# Patient Record
Sex: Female | Born: 1940
Health system: Southern US, Community
[De-identification: ages and names within clinical notes are randomized; demographics above are authoritative.]

## PROBLEM LIST (undated history)

## (undated) DIAGNOSIS — J189 Pneumonia, unspecified organism: Secondary | ICD-10-CM

## (undated) DIAGNOSIS — I38 Endocarditis, valve unspecified: Secondary | ICD-10-CM

## (undated) DIAGNOSIS — M1990 Unspecified osteoarthritis, unspecified site: Secondary | ICD-10-CM

## (undated) DIAGNOSIS — H3530 Unspecified macular degeneration: Secondary | ICD-10-CM

## (undated) DIAGNOSIS — N3946 Mixed incontinence: Secondary | ICD-10-CM

## (undated) DIAGNOSIS — R011 Cardiac murmur, unspecified: Secondary | ICD-10-CM

## (undated) DIAGNOSIS — R112 Nausea with vomiting, unspecified: Secondary | ICD-10-CM

## (undated) DIAGNOSIS — M5430 Sciatica, unspecified side: Secondary | ICD-10-CM

## (undated) DIAGNOSIS — F4320 Adjustment disorder, unspecified: Secondary | ICD-10-CM

## (undated) DIAGNOSIS — E7800 Pure hypercholesterolemia, unspecified: Secondary | ICD-10-CM

## (undated) DIAGNOSIS — I10 Essential (primary) hypertension: Secondary | ICD-10-CM

## (undated) DIAGNOSIS — G43909 Migraine, unspecified, not intractable, without status migrainosus: Secondary | ICD-10-CM

## (undated) DIAGNOSIS — I4891 Unspecified atrial fibrillation: Secondary | ICD-10-CM

## (undated) DIAGNOSIS — K224 Dyskinesia of esophagus: Secondary | ICD-10-CM

## (undated) DIAGNOSIS — E663 Overweight: Secondary | ICD-10-CM

## (undated) DIAGNOSIS — H269 Unspecified cataract: Secondary | ICD-10-CM

## (undated) DIAGNOSIS — E119 Type 2 diabetes mellitus without complications: Secondary | ICD-10-CM

## (undated) DIAGNOSIS — I2510 Atherosclerotic heart disease of native coronary artery without angina pectoris: Secondary | ICD-10-CM

## (undated) DIAGNOSIS — M5137 Other intervertebral disc degeneration, lumbosacral region: Secondary | ICD-10-CM

## (undated) DIAGNOSIS — I201 Angina pectoris with documented spasm: Secondary | ICD-10-CM

## (undated) DIAGNOSIS — H9190 Unspecified hearing loss, unspecified ear: Secondary | ICD-10-CM

## (undated) DIAGNOSIS — A6920 Lyme disease, unspecified: Secondary | ICD-10-CM

## (undated) DIAGNOSIS — M797 Fibromyalgia: Secondary | ICD-10-CM

## (undated) DIAGNOSIS — I509 Heart failure, unspecified: Secondary | ICD-10-CM

## (undated) DIAGNOSIS — N958 Other specified menopausal and perimenopausal disorders: Secondary | ICD-10-CM

## (undated) DIAGNOSIS — G459 Transient cerebral ischemic attack, unspecified: Secondary | ICD-10-CM

## (undated) DIAGNOSIS — Z9889 Other specified postprocedural states: Secondary | ICD-10-CM

## (undated) DIAGNOSIS — G4700 Insomnia, unspecified: Secondary | ICD-10-CM

## (undated) DIAGNOSIS — M24569 Contracture, unspecified knee: Secondary | ICD-10-CM

## (undated) DIAGNOSIS — L409 Psoriasis, unspecified: Secondary | ICD-10-CM

## (undated) DIAGNOSIS — N209 Urinary calculus, unspecified: Secondary | ICD-10-CM

## (undated) HISTORY — DX: Unspecified macular degeneration: H35.30

## (undated) HISTORY — DX: Lyme disease, unspecified: A69.20

## (undated) HISTORY — DX: Type 2 diabetes mellitus without complications: E11.9

## (undated) HISTORY — DX: Unspecified atrial fibrillation: I48.91

## (undated) HISTORY — DX: Unspecified cataract: H26.9

## (undated) HISTORY — DX: Psoriasis, unspecified: L40.9

## (undated) HISTORY — DX: Transient cerebral ischemic attack, unspecified: G45.9

## (undated) HISTORY — PX: REPLACEMENT TOTAL KNEE BILATERAL: SUR1225

## (undated) HISTORY — PX: JOINT REPLACEMENT: SHX530

## (undated) HISTORY — DX: Adjustment disorder, unspecified: F43.20

## (undated) HISTORY — DX: Angina pectoris with documented spasm: I20.1

## (undated) HISTORY — DX: Cardiac murmur, unspecified: R01.1

## (undated) HISTORY — DX: Urinary calculus, unspecified: N20.9

## (undated) HISTORY — PX: CATARACT EXTRACTION, BILATERAL: SHX1313

## (undated) HISTORY — DX: Pneumonia, unspecified organism: J18.9

## (undated) HISTORY — DX: Dyskinesia of esophagus: K22.4

## (undated) HISTORY — DX: Contracture, unspecified knee: M24.569

## (undated) HISTORY — DX: Mixed incontinence: N39.46

## (undated) HISTORY — DX: Other specified menopausal and perimenopausal disorders: N95.8

## (undated) HISTORY — DX: Sciatica, unspecified side: M54.30

## (undated) HISTORY — DX: Migraine, unspecified, not intractable, without status migrainosus: G43.909

## (undated) HISTORY — DX: Overweight: E66.3

## (undated) HISTORY — DX: Unspecified osteoarthritis, unspecified site: M19.90

## (undated) HISTORY — DX: Endocarditis, valve unspecified: I38

## (undated) HISTORY — PX: ABDOMINAL HYSTERECTOMY: SHX81

## (undated) HISTORY — PX: FRACTURE SURGERY: SHX138

## (undated) HISTORY — DX: Unspecified hearing loss, unspecified ear: H91.90

## (undated) HISTORY — DX: Other intervertebral disc degeneration, lumbosacral region: M51.37

## (undated) HISTORY — DX: Insomnia, unspecified: G47.00

---

## 1968-03-26 HISTORY — PX: NISSEN FUNDOPLICATION: SHX2091

## 1979-03-27 HISTORY — PX: CHOLECYSTECTOMY: SHX55

## 2003-10-25 HISTORY — PX: COLONOSCOPY: SHX174

## 2005-10-17 DIAGNOSIS — N209 Urinary calculus, unspecified: Secondary | ICD-10-CM

## 2005-10-17 DIAGNOSIS — G43909 Migraine, unspecified, not intractable, without status migrainosus: Secondary | ICD-10-CM

## 2005-10-17 DIAGNOSIS — F4320 Adjustment disorder, unspecified: Secondary | ICD-10-CM

## 2005-10-17 DIAGNOSIS — I201 Angina pectoris with documented spasm: Secondary | ICD-10-CM

## 2005-10-17 DIAGNOSIS — N3946 Mixed incontinence: Secondary | ICD-10-CM

## 2005-10-17 HISTORY — DX: Adjustment disorder, unspecified: F43.20

## 2005-10-17 HISTORY — DX: Migraine, unspecified, not intractable, without status migrainosus: G43.909

## 2005-10-17 HISTORY — DX: Mixed incontinence: N39.46

## 2005-10-17 HISTORY — DX: Urinary calculus, unspecified: N20.9

## 2005-10-17 HISTORY — DX: Angina pectoris with documented spasm: I20.1

## 2006-04-18 HISTORY — PX: COLONOSCOPY: SHX174

## 2006-06-12 DIAGNOSIS — L409 Psoriasis, unspecified: Secondary | ICD-10-CM

## 2006-06-12 HISTORY — DX: Psoriasis, unspecified: L40.9

## 2006-09-24 HISTORY — PX: ESOPHAGOGASTRODUODENOSCOPY: SHX1529

## 2008-02-20 HISTORY — PX: ESOPHAGOGASTRODUODENOSCOPY: SHX1529

## 2009-03-21 HISTORY — PX: ESOPHAGOGASTRODUODENOSCOPY: SHX1529

## 2009-07-26 DIAGNOSIS — M51379 Other intervertebral disc degeneration, lumbosacral region without mention of lumbar back pain or lower extremity pain: Secondary | ICD-10-CM

## 2009-07-26 DIAGNOSIS — M24569 Contracture, unspecified knee: Secondary | ICD-10-CM

## 2009-07-26 DIAGNOSIS — M5137 Other intervertebral disc degeneration, lumbosacral region: Secondary | ICD-10-CM

## 2009-07-26 HISTORY — DX: Other intervertebral disc degeneration, lumbosacral region: M51.37

## 2009-07-26 HISTORY — DX: Contracture, unspecified knee: M24.569

## 2009-07-26 HISTORY — DX: Other intervertebral disc degeneration, lumbosacral region without mention of lumbar back pain or lower extremity pain: M51.379

## 2009-10-10 DIAGNOSIS — N958 Other specified menopausal and perimenopausal disorders: Secondary | ICD-10-CM

## 2009-10-10 DIAGNOSIS — G4700 Insomnia, unspecified: Secondary | ICD-10-CM

## 2009-10-10 HISTORY — DX: Other specified menopausal and perimenopausal disorders: N95.8

## 2009-10-10 HISTORY — DX: Insomnia, unspecified: G47.00

## 2010-09-19 DIAGNOSIS — E663 Overweight: Secondary | ICD-10-CM

## 2010-09-19 HISTORY — DX: Overweight: E66.3

## 2010-12-18 DIAGNOSIS — H9190 Unspecified hearing loss, unspecified ear: Secondary | ICD-10-CM

## 2010-12-18 HISTORY — DX: Unspecified hearing loss, unspecified ear: H91.90

## 2012-05-29 HISTORY — PX: ESOPHAGOGASTRODUODENOSCOPY: SHX1529

## 2013-03-26 HISTORY — PX: CORONARY ARTERY BYPASS GRAFT: SHX141

## 2013-10-16 HISTORY — PX: ESOPHAGOGASTRODUODENOSCOPY: SHX1529

## 2013-10-24 LAB — HEPATIC FUNCTION PANEL
ALK PHOS: 123 U/L (ref 25–125)
ALT: 27 U/L (ref 7–35)
AST: 20 U/L (ref 13–35)
BILIRUBIN, TOTAL: 0.2 mg/dL

## 2013-11-24 HISTORY — PX: MITRAL VALVE REPAIR: SHX2039

## 2013-12-10 HISTORY — PX: AORTIC VALVE REPLACEMENT: SHX41

## 2014-03-25 DIAGNOSIS — I951 Orthostatic hypotension: Secondary | ICD-10-CM | POA: Diagnosis not present

## 2014-03-25 DIAGNOSIS — Z954 Presence of other heart-valve replacement: Secondary | ICD-10-CM | POA: Diagnosis not present

## 2014-03-25 DIAGNOSIS — I5021 Acute systolic (congestive) heart failure: Secondary | ICD-10-CM | POA: Diagnosis not present

## 2014-03-25 DIAGNOSIS — I480 Paroxysmal atrial fibrillation: Secondary | ICD-10-CM | POA: Diagnosis not present

## 2014-03-25 DIAGNOSIS — Z955 Presence of coronary angioplasty implant and graft: Secondary | ICD-10-CM | POA: Diagnosis not present

## 2014-03-25 DIAGNOSIS — R531 Weakness: Secondary | ICD-10-CM | POA: Diagnosis not present

## 2014-03-25 DIAGNOSIS — Z7901 Long term (current) use of anticoagulants: Secondary | ICD-10-CM | POA: Diagnosis not present

## 2014-03-29 DIAGNOSIS — Z7901 Long term (current) use of anticoagulants: Secondary | ICD-10-CM | POA: Diagnosis not present

## 2014-03-29 DIAGNOSIS — I951 Orthostatic hypotension: Secondary | ICD-10-CM | POA: Diagnosis not present

## 2014-03-29 DIAGNOSIS — I5021 Acute systolic (congestive) heart failure: Secondary | ICD-10-CM | POA: Diagnosis not present

## 2014-03-29 DIAGNOSIS — Z5181 Encounter for therapeutic drug level monitoring: Secondary | ICD-10-CM | POA: Diagnosis not present

## 2014-03-29 DIAGNOSIS — R531 Weakness: Secondary | ICD-10-CM | POA: Diagnosis not present

## 2014-03-29 DIAGNOSIS — I480 Paroxysmal atrial fibrillation: Secondary | ICD-10-CM | POA: Diagnosis not present

## 2014-03-29 DIAGNOSIS — Z954 Presence of other heart-valve replacement: Secondary | ICD-10-CM | POA: Diagnosis not present

## 2014-04-01 DIAGNOSIS — I5021 Acute systolic (congestive) heart failure: Secondary | ICD-10-CM | POA: Diagnosis not present

## 2014-04-01 DIAGNOSIS — R531 Weakness: Secondary | ICD-10-CM | POA: Diagnosis not present

## 2014-04-01 DIAGNOSIS — Z5181 Encounter for therapeutic drug level monitoring: Secondary | ICD-10-CM | POA: Diagnosis not present

## 2014-04-01 DIAGNOSIS — Z7901 Long term (current) use of anticoagulants: Secondary | ICD-10-CM | POA: Diagnosis not present

## 2014-04-01 DIAGNOSIS — I480 Paroxysmal atrial fibrillation: Secondary | ICD-10-CM | POA: Diagnosis not present

## 2014-04-01 DIAGNOSIS — Z954 Presence of other heart-valve replacement: Secondary | ICD-10-CM | POA: Diagnosis not present

## 2014-04-01 DIAGNOSIS — I951 Orthostatic hypotension: Secondary | ICD-10-CM | POA: Diagnosis not present

## 2014-04-02 DIAGNOSIS — I480 Paroxysmal atrial fibrillation: Secondary | ICD-10-CM | POA: Diagnosis not present

## 2014-04-02 DIAGNOSIS — Z954 Presence of other heart-valve replacement: Secondary | ICD-10-CM | POA: Diagnosis not present

## 2014-04-02 DIAGNOSIS — R531 Weakness: Secondary | ICD-10-CM | POA: Diagnosis not present

## 2014-04-02 DIAGNOSIS — Z7901 Long term (current) use of anticoagulants: Secondary | ICD-10-CM | POA: Diagnosis not present

## 2014-04-02 DIAGNOSIS — I5021 Acute systolic (congestive) heart failure: Secondary | ICD-10-CM | POA: Diagnosis not present

## 2014-04-02 DIAGNOSIS — I951 Orthostatic hypotension: Secondary | ICD-10-CM | POA: Diagnosis not present

## 2014-04-05 DIAGNOSIS — R531 Weakness: Secondary | ICD-10-CM | POA: Diagnosis not present

## 2014-04-05 DIAGNOSIS — I480 Paroxysmal atrial fibrillation: Secondary | ICD-10-CM | POA: Diagnosis not present

## 2014-04-05 DIAGNOSIS — Z7901 Long term (current) use of anticoagulants: Secondary | ICD-10-CM | POA: Diagnosis not present

## 2014-04-05 DIAGNOSIS — I5021 Acute systolic (congestive) heart failure: Secondary | ICD-10-CM | POA: Diagnosis not present

## 2014-04-05 DIAGNOSIS — Z5181 Encounter for therapeutic drug level monitoring: Secondary | ICD-10-CM | POA: Diagnosis not present

## 2014-04-05 DIAGNOSIS — I951 Orthostatic hypotension: Secondary | ICD-10-CM | POA: Diagnosis not present

## 2014-04-05 DIAGNOSIS — Z954 Presence of other heart-valve replacement: Secondary | ICD-10-CM | POA: Diagnosis not present

## 2014-04-06 DIAGNOSIS — I2510 Atherosclerotic heart disease of native coronary artery without angina pectoris: Secondary | ICD-10-CM | POA: Diagnosis not present

## 2014-04-06 DIAGNOSIS — M797 Fibromyalgia: Secondary | ICD-10-CM | POA: Diagnosis not present

## 2014-04-06 DIAGNOSIS — I068 Other rheumatic aortic valve diseases: Secondary | ICD-10-CM | POA: Diagnosis not present

## 2014-04-06 DIAGNOSIS — E118 Type 2 diabetes mellitus with unspecified complications: Secondary | ICD-10-CM | POA: Diagnosis not present

## 2014-04-06 DIAGNOSIS — R531 Weakness: Secondary | ICD-10-CM | POA: Diagnosis not present

## 2014-04-06 DIAGNOSIS — I10 Essential (primary) hypertension: Secondary | ICD-10-CM | POA: Diagnosis not present

## 2014-04-06 DIAGNOSIS — E784 Other hyperlipidemia: Secondary | ICD-10-CM | POA: Diagnosis not present

## 2014-04-07 DIAGNOSIS — I951 Orthostatic hypotension: Secondary | ICD-10-CM | POA: Diagnosis not present

## 2014-04-07 DIAGNOSIS — R531 Weakness: Secondary | ICD-10-CM | POA: Diagnosis not present

## 2014-04-07 DIAGNOSIS — Z743 Need for continuous supervision: Secondary | ICD-10-CM | POA: Diagnosis not present

## 2014-04-07 DIAGNOSIS — Z79899 Other long term (current) drug therapy: Secondary | ICD-10-CM | POA: Diagnosis not present

## 2014-04-07 DIAGNOSIS — I6789 Other cerebrovascular disease: Secondary | ICD-10-CM | POA: Diagnosis not present

## 2014-04-07 DIAGNOSIS — R42 Dizziness and giddiness: Secondary | ICD-10-CM | POA: Diagnosis not present

## 2014-04-07 DIAGNOSIS — I10 Essential (primary) hypertension: Secondary | ICD-10-CM | POA: Diagnosis not present

## 2014-04-07 DIAGNOSIS — J189 Pneumonia, unspecified organism: Secondary | ICD-10-CM | POA: Diagnosis not present

## 2014-04-07 DIAGNOSIS — Z7901 Long term (current) use of anticoagulants: Secondary | ICD-10-CM | POA: Diagnosis not present

## 2014-04-07 DIAGNOSIS — I480 Paroxysmal atrial fibrillation: Secondary | ICD-10-CM | POA: Diagnosis not present

## 2014-04-07 DIAGNOSIS — I5021 Acute systolic (congestive) heart failure: Secondary | ICD-10-CM | POA: Diagnosis not present

## 2014-04-07 DIAGNOSIS — E119 Type 2 diabetes mellitus without complications: Secondary | ICD-10-CM | POA: Diagnosis not present

## 2014-04-07 DIAGNOSIS — Z954 Presence of other heart-valve replacement: Secondary | ICD-10-CM | POA: Diagnosis not present

## 2014-04-09 DIAGNOSIS — I951 Orthostatic hypotension: Secondary | ICD-10-CM | POA: Diagnosis not present

## 2014-04-09 DIAGNOSIS — Z5181 Encounter for therapeutic drug level monitoring: Secondary | ICD-10-CM | POA: Diagnosis not present

## 2014-04-09 DIAGNOSIS — R531 Weakness: Secondary | ICD-10-CM | POA: Diagnosis not present

## 2014-04-09 DIAGNOSIS — Z7901 Long term (current) use of anticoagulants: Secondary | ICD-10-CM | POA: Diagnosis not present

## 2014-04-09 DIAGNOSIS — I480 Paroxysmal atrial fibrillation: Secondary | ICD-10-CM | POA: Diagnosis not present

## 2014-04-09 DIAGNOSIS — I5021 Acute systolic (congestive) heart failure: Secondary | ICD-10-CM | POA: Diagnosis not present

## 2014-04-09 DIAGNOSIS — Z954 Presence of other heart-valve replacement: Secondary | ICD-10-CM | POA: Diagnosis not present

## 2014-04-09 LAB — PROTIME-INR: INR: 1.5 — AB (ref 0.9–1.1)

## 2014-04-12 DIAGNOSIS — R531 Weakness: Secondary | ICD-10-CM | POA: Diagnosis not present

## 2014-04-12 DIAGNOSIS — I5021 Acute systolic (congestive) heart failure: Secondary | ICD-10-CM | POA: Diagnosis not present

## 2014-04-12 DIAGNOSIS — I951 Orthostatic hypotension: Secondary | ICD-10-CM | POA: Diagnosis not present

## 2014-04-12 DIAGNOSIS — Z954 Presence of other heart-valve replacement: Secondary | ICD-10-CM | POA: Diagnosis not present

## 2014-04-12 DIAGNOSIS — Z5181 Encounter for therapeutic drug level monitoring: Secondary | ICD-10-CM | POA: Diagnosis not present

## 2014-04-12 DIAGNOSIS — I480 Paroxysmal atrial fibrillation: Secondary | ICD-10-CM | POA: Diagnosis not present

## 2014-04-12 DIAGNOSIS — Z7901 Long term (current) use of anticoagulants: Secondary | ICD-10-CM | POA: Diagnosis not present

## 2014-04-12 LAB — PROTIME-INR: INR: 1.3 — AB (ref 0.9–1.1)

## 2014-04-15 DIAGNOSIS — Z7901 Long term (current) use of anticoagulants: Secondary | ICD-10-CM | POA: Diagnosis not present

## 2014-04-15 DIAGNOSIS — Z5181 Encounter for therapeutic drug level monitoring: Secondary | ICD-10-CM | POA: Diagnosis not present

## 2014-04-16 DIAGNOSIS — H3532 Exudative age-related macular degeneration: Secondary | ICD-10-CM | POA: Diagnosis not present

## 2014-04-16 DIAGNOSIS — H3531 Nonexudative age-related macular degeneration: Secondary | ICD-10-CM | POA: Diagnosis not present

## 2014-04-16 DIAGNOSIS — E119 Type 2 diabetes mellitus without complications: Secondary | ICD-10-CM | POA: Diagnosis not present

## 2014-04-16 DIAGNOSIS — H43813 Vitreous degeneration, bilateral: Secondary | ICD-10-CM | POA: Diagnosis not present

## 2014-04-16 LAB — HM DIABETES EYE EXAM

## 2014-04-19 DIAGNOSIS — I5021 Acute systolic (congestive) heart failure: Secondary | ICD-10-CM | POA: Diagnosis not present

## 2014-04-19 DIAGNOSIS — I480 Paroxysmal atrial fibrillation: Secondary | ICD-10-CM | POA: Diagnosis not present

## 2014-04-19 DIAGNOSIS — Z7901 Long term (current) use of anticoagulants: Secondary | ICD-10-CM | POA: Diagnosis not present

## 2014-04-19 DIAGNOSIS — Z954 Presence of other heart-valve replacement: Secondary | ICD-10-CM | POA: Diagnosis not present

## 2014-04-19 DIAGNOSIS — I951 Orthostatic hypotension: Secondary | ICD-10-CM | POA: Diagnosis not present

## 2014-04-19 DIAGNOSIS — R531 Weakness: Secondary | ICD-10-CM | POA: Diagnosis not present

## 2014-04-19 DIAGNOSIS — Z5181 Encounter for therapeutic drug level monitoring: Secondary | ICD-10-CM | POA: Diagnosis not present

## 2014-04-19 LAB — PROTIME-INR: INR: 1.8 — AB (ref 0.9–1.1)

## 2014-04-21 DIAGNOSIS — Z7901 Long term (current) use of anticoagulants: Secondary | ICD-10-CM | POA: Diagnosis not present

## 2014-04-21 DIAGNOSIS — Z5181 Encounter for therapeutic drug level monitoring: Secondary | ICD-10-CM | POA: Diagnosis not present

## 2014-04-21 LAB — PROTIME-INR: INR: 2.7 — AB (ref 0.9–1.1)

## 2014-04-23 DIAGNOSIS — R0602 Shortness of breath: Secondary | ICD-10-CM | POA: Diagnosis not present

## 2014-04-23 DIAGNOSIS — J90 Pleural effusion, not elsewhere classified: Secondary | ICD-10-CM | POA: Diagnosis not present

## 2014-04-23 DIAGNOSIS — Z7901 Long term (current) use of anticoagulants: Secondary | ICD-10-CM | POA: Diagnosis not present

## 2014-04-23 DIAGNOSIS — Z5181 Encounter for therapeutic drug level monitoring: Secondary | ICD-10-CM | POA: Diagnosis not present

## 2014-04-23 LAB — PROTIME-INR: INR: 2.4 — AB (ref 0.9–1.1)

## 2014-04-28 DIAGNOSIS — Z5181 Encounter for therapeutic drug level monitoring: Secondary | ICD-10-CM | POA: Diagnosis not present

## 2014-04-28 DIAGNOSIS — Z7901 Long term (current) use of anticoagulants: Secondary | ICD-10-CM | POA: Diagnosis not present

## 2014-04-28 DIAGNOSIS — Z954 Presence of other heart-valve replacement: Secondary | ICD-10-CM | POA: Diagnosis not present

## 2014-04-28 DIAGNOSIS — R531 Weakness: Secondary | ICD-10-CM | POA: Diagnosis not present

## 2014-04-28 DIAGNOSIS — I5021 Acute systolic (congestive) heart failure: Secondary | ICD-10-CM | POA: Diagnosis not present

## 2014-04-28 DIAGNOSIS — I951 Orthostatic hypotension: Secondary | ICD-10-CM | POA: Diagnosis not present

## 2014-04-28 DIAGNOSIS — I480 Paroxysmal atrial fibrillation: Secondary | ICD-10-CM | POA: Diagnosis not present

## 2014-04-28 LAB — PROTIME-INR: INR: 1.9 — AB (ref 0.9–1.1)

## 2014-05-03 DIAGNOSIS — Z7901 Long term (current) use of anticoagulants: Secondary | ICD-10-CM | POA: Diagnosis not present

## 2014-05-03 DIAGNOSIS — Z5181 Encounter for therapeutic drug level monitoring: Secondary | ICD-10-CM | POA: Diagnosis not present

## 2014-05-10 DIAGNOSIS — Z5181 Encounter for therapeutic drug level monitoring: Secondary | ICD-10-CM | POA: Diagnosis not present

## 2014-05-10 DIAGNOSIS — Z7901 Long term (current) use of anticoagulants: Secondary | ICD-10-CM | POA: Diagnosis not present

## 2014-05-10 DIAGNOSIS — I480 Paroxysmal atrial fibrillation: Secondary | ICD-10-CM | POA: Diagnosis not present

## 2014-05-14 DIAGNOSIS — H2513 Age-related nuclear cataract, bilateral: Secondary | ICD-10-CM | POA: Diagnosis not present

## 2014-05-14 DIAGNOSIS — E119 Type 2 diabetes mellitus without complications: Secondary | ICD-10-CM | POA: Diagnosis not present

## 2014-05-14 DIAGNOSIS — H3532 Exudative age-related macular degeneration: Secondary | ICD-10-CM | POA: Diagnosis not present

## 2014-05-14 DIAGNOSIS — H43813 Vitreous degeneration, bilateral: Secondary | ICD-10-CM | POA: Diagnosis not present

## 2014-05-14 LAB — HM DIABETES EYE EXAM

## 2014-05-17 DIAGNOSIS — I340 Nonrheumatic mitral (valve) insufficiency: Secondary | ICD-10-CM | POA: Diagnosis not present

## 2014-05-17 DIAGNOSIS — I4891 Unspecified atrial fibrillation: Secondary | ICD-10-CM | POA: Diagnosis not present

## 2014-05-17 DIAGNOSIS — I480 Paroxysmal atrial fibrillation: Secondary | ICD-10-CM | POA: Diagnosis not present

## 2014-05-17 DIAGNOSIS — Z952 Presence of prosthetic heart valve: Secondary | ICD-10-CM | POA: Diagnosis not present

## 2014-05-17 LAB — PROTIME-INR: INR: 1.5 — AB (ref 0.9–1.1)

## 2014-05-24 DIAGNOSIS — I4891 Unspecified atrial fibrillation: Secondary | ICD-10-CM | POA: Diagnosis not present

## 2014-05-24 LAB — PROTIME-INR: INR: 1.8 — AB (ref 0.9–1.1)

## 2014-05-31 DIAGNOSIS — I4891 Unspecified atrial fibrillation: Secondary | ICD-10-CM | POA: Diagnosis not present

## 2014-05-31 LAB — PROTIME-INR

## 2014-06-07 DIAGNOSIS — I4891 Unspecified atrial fibrillation: Secondary | ICD-10-CM | POA: Diagnosis not present

## 2014-06-07 LAB — PROTIME-INR: INR: 1.6 — AB (ref 0.9–1.1)

## 2014-06-16 DIAGNOSIS — E119 Type 2 diabetes mellitus without complications: Secondary | ICD-10-CM | POA: Diagnosis not present

## 2014-06-16 DIAGNOSIS — H3532 Exudative age-related macular degeneration: Secondary | ICD-10-CM | POA: Diagnosis not present

## 2014-06-16 DIAGNOSIS — H43813 Vitreous degeneration, bilateral: Secondary | ICD-10-CM | POA: Diagnosis not present

## 2014-06-16 DIAGNOSIS — H2513 Age-related nuclear cataract, bilateral: Secondary | ICD-10-CM | POA: Diagnosis not present

## 2014-06-16 LAB — HM DIABETES EYE EXAM

## 2014-06-22 DIAGNOSIS — I4891 Unspecified atrial fibrillation: Secondary | ICD-10-CM | POA: Diagnosis not present

## 2014-06-22 LAB — PROTIME-INR

## 2014-07-05 DIAGNOSIS — I2510 Atherosclerotic heart disease of native coronary artery without angina pectoris: Secondary | ICD-10-CM | POA: Diagnosis not present

## 2014-07-05 DIAGNOSIS — I4891 Unspecified atrial fibrillation: Secondary | ICD-10-CM | POA: Diagnosis not present

## 2014-07-05 DIAGNOSIS — I061 Rheumatic aortic insufficiency: Secondary | ICD-10-CM | POA: Diagnosis not present

## 2014-07-05 DIAGNOSIS — I509 Heart failure, unspecified: Secondary | ICD-10-CM | POA: Diagnosis not present

## 2014-07-05 DIAGNOSIS — E118 Type 2 diabetes mellitus with unspecified complications: Secondary | ICD-10-CM | POA: Diagnosis not present

## 2014-07-05 DIAGNOSIS — I10 Essential (primary) hypertension: Secondary | ICD-10-CM | POA: Diagnosis not present

## 2014-07-05 DIAGNOSIS — E785 Hyperlipidemia, unspecified: Secondary | ICD-10-CM | POA: Diagnosis not present

## 2014-07-06 DIAGNOSIS — E118 Type 2 diabetes mellitus with unspecified complications: Secondary | ICD-10-CM | POA: Diagnosis not present

## 2014-07-06 DIAGNOSIS — I4891 Unspecified atrial fibrillation: Secondary | ICD-10-CM | POA: Diagnosis not present

## 2014-07-06 DIAGNOSIS — I2510 Atherosclerotic heart disease of native coronary artery without angina pectoris: Secondary | ICD-10-CM | POA: Diagnosis not present

## 2014-07-06 LAB — PROTIME-INR: INR: 1.6 — AB (ref 0.9–1.1)

## 2014-07-19 DIAGNOSIS — I4891 Unspecified atrial fibrillation: Secondary | ICD-10-CM | POA: Diagnosis not present

## 2014-07-19 LAB — PROTIME-INR: INR: 2.1 — AB (ref 0.9–1.1)

## 2014-08-13 DIAGNOSIS — E119 Type 2 diabetes mellitus without complications: Secondary | ICD-10-CM | POA: Diagnosis not present

## 2014-08-13 DIAGNOSIS — Z043 Encounter for examination and observation following other accident: Secondary | ICD-10-CM | POA: Diagnosis not present

## 2014-08-13 DIAGNOSIS — I10 Essential (primary) hypertension: Secondary | ICD-10-CM | POA: Diagnosis not present

## 2014-08-13 DIAGNOSIS — S8991XA Unspecified injury of right lower leg, initial encounter: Secondary | ICD-10-CM | POA: Diagnosis not present

## 2014-08-13 DIAGNOSIS — Z79899 Other long term (current) drug therapy: Secondary | ICD-10-CM | POA: Diagnosis not present

## 2014-08-13 DIAGNOSIS — M25561 Pain in right knee: Secondary | ICD-10-CM | POA: Diagnosis not present

## 2014-08-13 DIAGNOSIS — S0990XA Unspecified injury of head, initial encounter: Secondary | ICD-10-CM | POA: Diagnosis not present

## 2014-08-17 DIAGNOSIS — I4891 Unspecified atrial fibrillation: Secondary | ICD-10-CM | POA: Diagnosis not present

## 2014-08-17 LAB — PROTIME-INR: INR: 2.5 — AB (ref 0.9–1.1)

## 2014-09-07 DIAGNOSIS — L723 Sebaceous cyst: Secondary | ICD-10-CM | POA: Diagnosis not present

## 2014-09-13 DIAGNOSIS — I4891 Unspecified atrial fibrillation: Secondary | ICD-10-CM | POA: Diagnosis not present

## 2014-09-13 LAB — PROTIME-INR: INR: 1.6 — AB (ref 0.9–1.1)

## 2014-09-17 DIAGNOSIS — E119 Type 2 diabetes mellitus without complications: Secondary | ICD-10-CM | POA: Diagnosis not present

## 2014-09-17 DIAGNOSIS — H43813 Vitreous degeneration, bilateral: Secondary | ICD-10-CM | POA: Diagnosis not present

## 2014-09-17 DIAGNOSIS — H2513 Age-related nuclear cataract, bilateral: Secondary | ICD-10-CM | POA: Diagnosis not present

## 2014-09-17 DIAGNOSIS — H3532 Exudative age-related macular degeneration: Secondary | ICD-10-CM | POA: Diagnosis not present

## 2014-09-17 LAB — HM DIABETES EYE EXAM

## 2014-09-21 DIAGNOSIS — L723 Sebaceous cyst: Secondary | ICD-10-CM | POA: Diagnosis not present

## 2014-09-28 DIAGNOSIS — Z5181 Encounter for therapeutic drug level monitoring: Secondary | ICD-10-CM | POA: Diagnosis not present

## 2014-09-28 DIAGNOSIS — J189 Pneumonia, unspecified organism: Secondary | ICD-10-CM | POA: Diagnosis not present

## 2014-09-28 DIAGNOSIS — R222 Localized swelling, mass and lump, trunk: Secondary | ICD-10-CM | POA: Diagnosis not present

## 2014-09-28 DIAGNOSIS — Z01812 Encounter for preprocedural laboratory examination: Secondary | ICD-10-CM | POA: Diagnosis not present

## 2014-09-28 DIAGNOSIS — Z01810 Encounter for preprocedural cardiovascular examination: Secondary | ICD-10-CM | POA: Diagnosis not present

## 2014-09-28 DIAGNOSIS — Z01818 Encounter for other preprocedural examination: Secondary | ICD-10-CM | POA: Diagnosis not present

## 2014-09-28 DIAGNOSIS — Z7901 Long term (current) use of anticoagulants: Secondary | ICD-10-CM | POA: Diagnosis not present

## 2014-09-28 LAB — PROTIME-INR

## 2014-10-11 DIAGNOSIS — Z01818 Encounter for other preprocedural examination: Secondary | ICD-10-CM | POA: Diagnosis not present

## 2014-10-11 DIAGNOSIS — L723 Sebaceous cyst: Secondary | ICD-10-CM | POA: Diagnosis not present

## 2014-10-11 DIAGNOSIS — E118 Type 2 diabetes mellitus with unspecified complications: Secondary | ICD-10-CM | POA: Diagnosis not present

## 2014-10-11 DIAGNOSIS — I068 Other rheumatic aortic valve diseases: Secondary | ICD-10-CM | POA: Diagnosis not present

## 2014-10-14 DIAGNOSIS — I2510 Atherosclerotic heart disease of native coronary artery without angina pectoris: Secondary | ICD-10-CM | POA: Diagnosis not present

## 2014-10-14 DIAGNOSIS — R2231 Localized swelling, mass and lump, right upper limb: Secondary | ICD-10-CM | POA: Diagnosis not present

## 2014-10-14 DIAGNOSIS — Z951 Presence of aortocoronary bypass graft: Secondary | ICD-10-CM | POA: Diagnosis not present

## 2014-10-14 DIAGNOSIS — E119 Type 2 diabetes mellitus without complications: Secondary | ICD-10-CM | POA: Diagnosis not present

## 2014-10-14 DIAGNOSIS — I509 Heart failure, unspecified: Secondary | ICD-10-CM | POA: Diagnosis not present

## 2014-10-14 DIAGNOSIS — I10 Essential (primary) hypertension: Secondary | ICD-10-CM | POA: Diagnosis not present

## 2014-10-14 DIAGNOSIS — L728 Other follicular cysts of the skin and subcutaneous tissue: Secondary | ICD-10-CM | POA: Diagnosis not present

## 2014-10-22 DIAGNOSIS — E119 Type 2 diabetes mellitus without complications: Secondary | ICD-10-CM | POA: Diagnosis not present

## 2014-10-22 DIAGNOSIS — H2513 Age-related nuclear cataract, bilateral: Secondary | ICD-10-CM | POA: Diagnosis not present

## 2014-10-22 DIAGNOSIS — H43813 Vitreous degeneration, bilateral: Secondary | ICD-10-CM | POA: Diagnosis not present

## 2014-10-22 DIAGNOSIS — H3532 Exudative age-related macular degeneration: Secondary | ICD-10-CM | POA: Diagnosis not present

## 2014-10-22 LAB — HM DIABETES EYE EXAM

## 2014-11-12 DIAGNOSIS — H2513 Age-related nuclear cataract, bilateral: Secondary | ICD-10-CM | POA: Diagnosis not present

## 2014-11-12 DIAGNOSIS — E119 Type 2 diabetes mellitus without complications: Secondary | ICD-10-CM | POA: Diagnosis not present

## 2014-11-12 DIAGNOSIS — H3532 Exudative age-related macular degeneration: Secondary | ICD-10-CM | POA: Diagnosis not present

## 2014-11-12 DIAGNOSIS — H43813 Vitreous degeneration, bilateral: Secondary | ICD-10-CM | POA: Diagnosis not present

## 2014-11-12 LAB — HM DIABETES EYE EXAM

## 2014-11-23 DIAGNOSIS — I4891 Unspecified atrial fibrillation: Secondary | ICD-10-CM | POA: Diagnosis not present

## 2014-11-23 DIAGNOSIS — Z23 Encounter for immunization: Secondary | ICD-10-CM | POA: Diagnosis not present

## 2014-11-23 LAB — PROTIME-INR

## 2014-11-24 DIAGNOSIS — H3532 Exudative age-related macular degeneration: Secondary | ICD-10-CM | POA: Diagnosis not present

## 2014-11-24 LAB — HM DIABETES EYE EXAM

## 2015-01-08 DIAGNOSIS — I4891 Unspecified atrial fibrillation: Secondary | ICD-10-CM | POA: Diagnosis not present

## 2015-01-08 LAB — PROTIME-INR: INR: 1.6 — AB (ref 0.9–1.1)

## 2015-01-10 DIAGNOSIS — J019 Acute sinusitis, unspecified: Secondary | ICD-10-CM | POA: Diagnosis not present

## 2015-01-21 DIAGNOSIS — I4891 Unspecified atrial fibrillation: Secondary | ICD-10-CM | POA: Diagnosis not present

## 2015-01-21 DIAGNOSIS — I2510 Atherosclerotic heart disease of native coronary artery without angina pectoris: Secondary | ICD-10-CM | POA: Diagnosis not present

## 2015-01-21 DIAGNOSIS — I509 Heart failure, unspecified: Secondary | ICD-10-CM | POA: Diagnosis not present

## 2015-01-21 DIAGNOSIS — E1165 Type 2 diabetes mellitus with hyperglycemia: Secondary | ICD-10-CM | POA: Diagnosis not present

## 2015-01-21 DIAGNOSIS — E784 Other hyperlipidemia: Secondary | ICD-10-CM | POA: Diagnosis not present

## 2015-01-21 DIAGNOSIS — I10 Essential (primary) hypertension: Secondary | ICD-10-CM | POA: Diagnosis not present

## 2015-02-07 DIAGNOSIS — I509 Heart failure, unspecified: Secondary | ICD-10-CM | POA: Diagnosis not present

## 2015-02-07 DIAGNOSIS — I2510 Atherosclerotic heart disease of native coronary artery without angina pectoris: Secondary | ICD-10-CM | POA: Diagnosis not present

## 2015-02-07 DIAGNOSIS — I4891 Unspecified atrial fibrillation: Secondary | ICD-10-CM | POA: Diagnosis not present

## 2015-02-07 DIAGNOSIS — E1165 Type 2 diabetes mellitus with hyperglycemia: Secondary | ICD-10-CM | POA: Diagnosis not present

## 2015-02-07 LAB — HEPATIC FUNCTION PANEL
ALT: 18 U/L (ref 7–35)
AST: 17 U/L (ref 13–35)
Alkaline Phosphatase: 123 U/L (ref 25–125)
BILIRUBIN, TOTAL: 0.2 mg/dL

## 2015-02-07 LAB — CBC AND DIFFERENTIAL
HCT: 36 % (ref 36–46)
Hemoglobin: 11.4 g/dL — AB (ref 12.0–16.0)
Platelets: 361 10*3/uL (ref 150–399)
WBC: 10.2 10*3/mL

## 2015-02-07 LAB — BASIC METABOLIC PANEL
BUN: 18 mg/dL (ref 4–21)
CREATININE: 0.8 mg/dL (ref 0.5–1.1)
GLUCOSE: 85 mg/dL
Potassium: 3.8 mmol/L (ref 3.4–5.3)
Sodium: 139 mmol/L (ref 137–147)

## 2015-02-07 LAB — HEMOGLOBIN A1C: HEMOGLOBIN A1C: 6.4

## 2015-02-07 LAB — LIPID PANEL
Cholesterol: 179 mg/dL (ref 0–200)
HDL: 63 mg/dL (ref 35–70)
LDL Cholesterol: 81 mg/dL
Triglycerides: 176 mg/dL — AB (ref 40–160)

## 2015-02-07 LAB — MICROALBUMIN, URINE: Microalb, Ur: 420

## 2015-02-07 LAB — POCT INR: INR: 6.5 — AB (ref 0.9–1.1)

## 2015-02-07 LAB — POCT ERYTHROCYTE SEDIMENTATION RATE, NON-AUTOMATED: SED RATE: 66 mm

## 2015-02-11 DIAGNOSIS — N390 Urinary tract infection, site not specified: Secondary | ICD-10-CM | POA: Diagnosis not present

## 2015-02-11 DIAGNOSIS — M549 Dorsalgia, unspecified: Secondary | ICD-10-CM | POA: Diagnosis not present

## 2015-02-11 DIAGNOSIS — R300 Dysuria: Secondary | ICD-10-CM | POA: Diagnosis not present

## 2015-02-18 DIAGNOSIS — M549 Dorsalgia, unspecified: Secondary | ICD-10-CM | POA: Diagnosis not present

## 2015-02-18 DIAGNOSIS — Z9049 Acquired absence of other specified parts of digestive tract: Secondary | ICD-10-CM | POA: Diagnosis not present

## 2015-02-18 DIAGNOSIS — R1032 Left lower quadrant pain: Secondary | ICD-10-CM | POA: Diagnosis not present

## 2015-02-18 DIAGNOSIS — R1031 Right lower quadrant pain: Secondary | ICD-10-CM | POA: Diagnosis not present

## 2015-04-01 DIAGNOSIS — I4891 Unspecified atrial fibrillation: Secondary | ICD-10-CM | POA: Diagnosis not present

## 2015-05-02 ENCOUNTER — Encounter (HOSPITAL_COMMUNITY): Payer: Self-pay | Admitting: Emergency Medicine

## 2015-05-02 ENCOUNTER — Emergency Department (HOSPITAL_COMMUNITY): Payer: Medicare Other

## 2015-05-02 ENCOUNTER — Observation Stay (HOSPITAL_COMMUNITY)
Admission: EM | Admit: 2015-05-02 | Discharge: 2015-05-03 | Disposition: A | Discharge status: Home / Self Care | Payer: Medicare Other | Attending: Family Medicine | Admitting: Family Medicine

## 2015-05-02 DIAGNOSIS — Z87891 Personal history of nicotine dependence: Secondary | ICD-10-CM | POA: Insufficient documentation

## 2015-05-02 DIAGNOSIS — M797 Fibromyalgia: Secondary | ICD-10-CM | POA: Diagnosis not present

## 2015-05-02 DIAGNOSIS — Z7982 Long term (current) use of aspirin: Secondary | ICD-10-CM | POA: Diagnosis not present

## 2015-05-02 DIAGNOSIS — Z79899 Other long term (current) drug therapy: Secondary | ICD-10-CM | POA: Diagnosis not present

## 2015-05-02 DIAGNOSIS — G459 Transient cerebral ischemic attack, unspecified: Secondary | ICD-10-CM | POA: Diagnosis not present

## 2015-05-02 DIAGNOSIS — H3530 Unspecified macular degeneration: Secondary | ICD-10-CM | POA: Insufficient documentation

## 2015-05-02 DIAGNOSIS — R200 Anesthesia of skin: Secondary | ICD-10-CM | POA: Diagnosis not present

## 2015-05-02 DIAGNOSIS — E7800 Pure hypercholesterolemia, unspecified: Secondary | ICD-10-CM | POA: Diagnosis not present

## 2015-05-02 DIAGNOSIS — Z880 Allergy status to penicillin: Secondary | ICD-10-CM | POA: Insufficient documentation

## 2015-05-02 DIAGNOSIS — Z7901 Long term (current) use of anticoagulants: Secondary | ICD-10-CM | POA: Insufficient documentation

## 2015-05-02 DIAGNOSIS — I10 Essential (primary) hypertension: Secondary | ICD-10-CM

## 2015-05-02 DIAGNOSIS — Z7984 Long term (current) use of oral hypoglycemic drugs: Secondary | ICD-10-CM | POA: Insufficient documentation

## 2015-05-02 DIAGNOSIS — E119 Type 2 diabetes mellitus without complications: Secondary | ICD-10-CM | POA: Insufficient documentation

## 2015-05-02 DIAGNOSIS — I2510 Atherosclerotic heart disease of native coronary artery without angina pectoris: Secondary | ICD-10-CM | POA: Insufficient documentation

## 2015-05-02 DIAGNOSIS — R202 Paresthesia of skin: Secondary | ICD-10-CM | POA: Diagnosis not present

## 2015-05-02 HISTORY — DX: Type 2 diabetes mellitus without complications: E11.9

## 2015-05-02 HISTORY — DX: Fibromyalgia: M79.7

## 2015-05-02 HISTORY — DX: Pure hypercholesterolemia, unspecified: E78.00

## 2015-05-02 HISTORY — DX: Essential (primary) hypertension: I10

## 2015-05-02 HISTORY — DX: Unspecified macular degeneration: H35.30

## 2015-05-02 HISTORY — DX: Heart failure, unspecified: I50.9

## 2015-05-02 HISTORY — DX: Atherosclerotic heart disease of native coronary artery without angina pectoris: I25.10

## 2015-05-02 LAB — URINE MICROSCOPIC-ADD ON: RBC / HPF: NONE SEEN RBC/hpf (ref 0–5)

## 2015-05-02 LAB — CBC WITH DIFFERENTIAL/PLATELET
Basophils Absolute: 0 10*3/uL (ref 0.0–0.1)
Basophils Relative: 0 %
EOS ABS: 0.3 10*3/uL (ref 0.0–0.7)
Eosinophils Relative: 3 %
HEMATOCRIT: 37.5 % (ref 36.0–46.0)
HEMOGLOBIN: 12.4 g/dL (ref 12.0–15.0)
LYMPHS ABS: 2.1 10*3/uL (ref 0.7–4.0)
Lymphocytes Relative: 23 %
MCH: 28 pg (ref 26.0–34.0)
MCHC: 33.1 g/dL (ref 30.0–36.0)
MCV: 84.7 fL (ref 78.0–100.0)
MONOS PCT: 8 %
Monocytes Absolute: 0.8 10*3/uL (ref 0.1–1.0)
NEUTROS PCT: 66 %
Neutro Abs: 6.1 10*3/uL (ref 1.7–7.7)
Platelets: 353 10*3/uL (ref 150–400)
RBC: 4.43 MIL/uL (ref 3.87–5.11)
RDW: 14.5 % (ref 11.5–15.5)
WBC: 9.2 10*3/uL (ref 4.0–10.5)

## 2015-05-02 LAB — COMPREHENSIVE METABOLIC PANEL
ALK PHOS: 97 U/L (ref 38–126)
ALT: 16 U/L (ref 14–54)
ANION GAP: 12 (ref 5–15)
AST: 23 U/L (ref 15–41)
Albumin: 3.9 g/dL (ref 3.5–5.0)
BILIRUBIN TOTAL: 0.4 mg/dL (ref 0.3–1.2)
BUN: 24 mg/dL — ABNORMAL HIGH (ref 6–20)
CO2: 25 mmol/L (ref 22–32)
Calcium: 9.2 mg/dL (ref 8.9–10.3)
Chloride: 104 mmol/L (ref 101–111)
Creatinine, Ser: 0.99 mg/dL (ref 0.44–1.00)
GFR calc non Af Amer: 56 mL/min — ABNORMAL LOW (ref >60)
GLUCOSE: 155 mg/dL — AB (ref 65–99)
POTASSIUM: 3.6 mmol/L (ref 3.5–5.1)
SODIUM: 141 mmol/L (ref 135–145)
TOTAL PROTEIN: 7 g/dL (ref 6.5–8.1)

## 2015-05-02 LAB — CBG MONITORING, ED: Glucose-Capillary: 118 mg/dL — ABNORMAL HIGH (ref 65–99)

## 2015-05-02 LAB — PROTIME-INR
INR: 2.16 — AB (ref 0.00–1.49)
PROTHROMBIN TIME: 23.9 s — AB (ref 11.6–15.2)

## 2015-05-02 LAB — URINALYSIS, ROUTINE W REFLEX MICROSCOPIC
Bilirubin Urine: NEGATIVE
Glucose, UA: NEGATIVE mg/dL
Hgb urine dipstick: NEGATIVE
KETONES UR: NEGATIVE mg/dL
NITRITE: NEGATIVE
PH: 5.5 (ref 5.0–8.0)
PROTEIN: NEGATIVE mg/dL
Specific Gravity, Urine: 1.025 (ref 1.005–1.030)

## 2015-05-02 LAB — TROPONIN I: Troponin I: <0.03 ng/mL (ref <0.031)

## 2015-05-02 MED ORDER — WARFARIN SODIUM 5 MG PO TABS
12.0000 mg | ORAL_TABLET | Freq: Once | ORAL | Status: AC
  Administered 2015-05-02: 12 mg via ORAL
  Filled 2015-05-02: qty 2

## 2015-05-02 MED ORDER — METOPROLOL TARTRATE 25 MG PO TABS
12.5000 mg | ORAL_TABLET | Freq: Two times a day (BID) | ORAL | Status: DC
  Administered 2015-05-02 – 2015-05-03 (×2): 12.5 mg via ORAL
  Filled 2015-05-02 (×2): qty 1

## 2015-05-02 MED ORDER — ASPIRIN EC 81 MG PO TBEC
81.0000 mg | DELAYED_RELEASE_TABLET | Freq: Every day | ORAL | Status: DC
  Administered 2015-05-03: 81 mg via ORAL
  Filled 2015-05-02: qty 1

## 2015-05-02 MED ORDER — INSULIN ASPART 100 UNIT/ML ~~LOC~~ SOLN
0.0000 [IU] | Freq: Three times a day (TID) | SUBCUTANEOUS | Status: DC
  Administered 2015-05-03: 1 [IU] via SUBCUTANEOUS

## 2015-05-02 MED ORDER — IRBESARTAN 75 MG PO TABS
75.0000 mg | ORAL_TABLET | Freq: Every day | ORAL | Status: DC
  Administered 2015-05-02 – 2015-05-03 (×2): 75 mg via ORAL
  Filled 2015-05-02 (×2): qty 1

## 2015-05-02 MED ORDER — LORAZEPAM 2 MG/ML IJ SOLN
1.0000 mg | Freq: Once | INTRAMUSCULAR | Status: AC
  Administered 2015-05-02: 1 mg via INTRAVENOUS
  Filled 2015-05-02: qty 1

## 2015-05-02 MED ORDER — WARFARIN - PHARMACIST DOSING INPATIENT
Status: DC
  Administered 2015-05-03: 16:00:00

## 2015-05-02 MED ORDER — POTASSIUM CHLORIDE CRYS ER 10 MEQ PO TBCR
10.0000 meq | EXTENDED_RELEASE_TABLET | Freq: Every day | ORAL | Status: DC
  Administered 2015-05-02 – 2015-05-03 (×2): 10 meq via ORAL
  Filled 2015-05-02 (×4): qty 1

## 2015-05-02 MED ORDER — TRAMADOL HCL 50 MG PO TABS
50.0000 mg | ORAL_TABLET | ORAL | Status: DC | PRN
  Administered 2015-05-02 – 2015-05-03 (×3): 50 mg via ORAL
  Filled 2015-05-02 (×4): qty 1

## 2015-05-02 MED ORDER — STROKE: EARLY STAGES OF RECOVERY BOOK
Freq: Once | Status: AC
  Administered 2015-05-03: 10:00:00
  Filled 2015-05-02: qty 1

## 2015-05-02 MED ORDER — FUROSEMIDE 20 MG PO TABS
20.0000 mg | ORAL_TABLET | Freq: Every day | ORAL | Status: DC
  Administered 2015-05-03: 20 mg via ORAL
  Filled 2015-05-02: qty 1

## 2015-05-02 NOTE — H&P (Signed)
PCP:   PROVIDER NOT IN SYSTEM   Chief Complaint:  Facial numbness  HPI: 75 year old female who   has a past medical history of Hypertension; Hypercholesteremia; Diabetes mellitus without complication ([place]); Fibromyalgia; Macular degeneration; CHF (congestive heart failure) ([place]); and Coronary artery disease. Today presents to the hospital after patient had right facial numbness and numbness in right hand for about 20 minutes. Patient was feeling lethargic, her daughter brought her to the hospital. By the time she reached the ED her symptoms had resolved. CT head was negative, MRI brain also negative for acute stroke. Patient denies any slurred speech. No focal weakness. No blurred vision.   no history of seizures.  Allergies:   Allergies  Allergen Reactions  . Penicillins Anaphylaxis    Has patient had a PCN reaction causing immediate rash, facial/tongue/throat swelling, SOB or lightheadedness with hypotension: Yes Has patient had a PCN reaction causing severe rash involving mucus membranes or skin necrosis: Yes Has patient had a PCN reaction that required hospitalization Yes Has patient had a PCN reaction occurring within the last 10 years: No If all of the above answers are "NO", then may proceed with Cephalosporin use.       Past Medical History  Diagnosis Date  . Hypertension   . Hypercholesteremia   . Diabetes mellitus without complication ([place])   . Fibromyalgia   . Macular degeneration   . CHF (congestive heart failure) ([place])   . Coronary artery disease     Past Surgical History  Procedure Laterality Date  . Cholecystectomy    . Cardiac surgery    . Fracture surgery    . Joint replacement      Prior to Admission medications   Medication Sig Start Date End Date Taking? Authorizing Provider  aspirin EC 81 MG tablet Take 81 mg by mouth daily.   Yes Historical Provider, MD  butalbital-acetaminophen-caffeine (FIORICET, ESGIC) 50-325-40 MG tablet Take 1 tablet by  mouth 2 (two) times daily as needed for headache.   Yes Historical Provider, MD  furosemide (LASIX) 20 MG tablet Take 20 mg by mouth daily.   Yes Historical Provider, MD  metFORMIN (GLUCOPHAGE) 500 MG tablet Take 500 mg by mouth 2 (two) times daily with a meal.   Yes Historical Provider, MD  metoprolol tartrate (LOPRESSOR) 25 MG tablet Take 12.5 mg by mouth 2 (two) times daily.   Yes Historical Provider, MD  Multiple Vitamins-Minerals (PRESERVISION AREDS 2 PO) Take 1 capsule by mouth 2 (two) times daily.   Yes Historical Provider, MD  potassium chloride (K-DUR) 10 MEQ tablet Take 10 mEq by mouth daily.   Yes Historical Provider, MD  traMADol (ULTRAM) 50 MG tablet Take 50 mg by mouth every 4 (four) hours as needed for moderate pain.   Yes Historical Provider, MD  valsartan (DIOVAN) 80 MG tablet Take 80 mg by mouth at bedtime.   Yes Historical Provider, MD  warfarin (COUMADIN) 10 MG tablet Take 2 mg by mouth every evening. *Takes 10mg  and 2mg  tablets to complete dose of 12mg *   Yes Historical Provider, MD  warfarin (COUMADIN) 2 MG tablet Take 10 mg by mouth every evening. *Takes 10mg  and 2mg  tablets to complete dose of 12mg *   Yes Historical Provider, MD    Social History:  reports that she has quit smoking. She has never used smokeless tobacco. She reports that she does not drink alcohol or use illicit drugs.  Family History  Problem Relation Age of Onset  . Stroke Mother   .  Heart failure Mother   . Diabetes Father   . Heart attack Father   . Stroke Father     Danley Danker Weights   05/02/15 1357  Weight: 67.586 kg (149 lb)    All the positives are listed in BOLD  Review of Systems:  HEENT: Headache, blurred vision, runny nose, sore throat Neck: Hypothyroidism, hyperthyroidism,,lymphadenopathy Chest : Shortness of breath, history of COPD, Asthma Heart : Chest pain, history of coronary arterey disease GI:  Nausea, vomiting, diarrhea, constipation, GERD GU: Dysuria, urgency, frequency of  urination, hematuria Neuro: Stroke, seizures, syncope Psych: Depression, anxiety, hallucinations   Physical Exam: Blood pressure 151/85, pulse 66, temperature 99.1 F (37.3 C), temperature source Oral, resp. rate 17, height 5\' 2"  (1.575 m), weight 67.586 kg (149 lb), SpO2 95 %. Constitutional:   Patient is a well-developed and well-nourished female* in no acute distress and cooperative with exam. Head: Normocephalic and atraumatic Mouth: Mucus membranes moist Eyes: PERRL, EOMI, conjunctivae normal Neck: Supple, No Thyromegaly Cardiovascular: RRR, S1 normal, S2 normal Pulmonary/Chest: CTAB, no wheezes, rales, or rhonchi Abdominal: Soft. Non-tender, non-distended, bowel sounds are normal, no masses, organomegaly, or guarding present.  Neurological: A&O x3, Strength is normal and symmetric bilaterally, cranial nerve II-XII are grossly intact, no focal motor deficit, sensory intact to light touch bilaterally.  Extremities : No Cyanosis, Clubbing or Edema  Labs on Admission:  Basic Metabolic Panel:  Recent Labs Lab 05/02/15 1435  NA 141  K 3.6  CL 104  CO2 25  GLUCOSE 155*  BUN 24*  CREATININE 0.99  CALCIUM 9.2   Liver Function Tests:  Recent Labs Lab 05/02/15 1435  AST 23  ALT 16  ALKPHOS 97  BILITOT 0.4  PROT 7.0  ALBUMIN 3.9   No results for input(s): LIPASE, AMYLASE in the last 168 hours. No results for input(s): AMMONIA in the last 168 hours. CBC:  Recent Labs Lab 05/02/15 1435  WBC 9.2  NEUTROABS 6.1  HGB 12.4  HCT 37.5  MCV 84.7  PLT 353   Cardiac Enzymes:  Recent Labs Lab 05/02/15 1435  TROPONINI <0.03     CBG:  Recent Labs Lab 05/02/15 1843  GLUCAP 118*    Radiological Exams on Admission: Dg Chest 2 View  05/02/2015  CLINICAL DATA:  Right-sided numbness. EXAM: CHEST  2 VIEW COMPARISON:  None. FINDINGS: Mitral valve and aortic valve replacement. Heart size upper normal. Negative for heart failure. Blunting left costophrenic angle likely  due to scarring or small effusion. No prior studies for comparison. Negative for pulmonary edema Negative for pneumonia or mass lesion. Mild hyperinflation of the lungs. IMPRESSION: Postop mitral valve and aortic valve replacement. Left pleural scarring/ fluid. Negative for heart failure. Electronically Signed   By: Franchot Gallo M.D.   On: 05/02/2015 14:37   Ct Head Wo Contrast  05/02/2015  CLINICAL DATA:  Right-sided face numbness started 30 minutes ago. EXAM: CT HEAD WITHOUT CONTRAST TECHNIQUE: Contiguous axial images were obtained from the base of the skull through the vertex without intravenous contrast. COMPARISON:  None. FINDINGS: Paranasal sinuses, mastoid air cells, bones, and extracranial soft tissues are within normal limits. No subdural, epidural, or subarachnoid hemorrhage identified on today's study. Low attenuation in the right parietal subcortical white matter such as on axial image 20 is identified. There is also age indeterminate low-attenuation in the right basal ganglia, extending into the corona radiata. The white matter is otherwise normal. No acute cortical infarct is identified. No mass, mass effect, or midline shift. Ventricles  and sulci are normal. Cerebellum, brainstem, and basal cisterns are within normal limits. IMPRESSION: There is age indeterminate subcortical decreased attenuation in the right parietal lobe. There is a similar but smaller finding in the white matter adjacent to the right basal ganglia. The findings likely represent age indeterminate white matter ischemic disease. An MRI could further characterize the age of these findings. Electronically Signed   By: Dorise Bullion III M.D   On: 05/02/2015 15:01   Mr Brain Wo Contrast  05/02/2015  CLINICAL DATA:  Right-sided facial numbness, 1 day duration. Headache. EXAM: MRI HEAD WITHOUT CONTRAST TECHNIQUE: Multiplanar, multiecho pulse sequences of the brain and surrounding structures were obtained without intravenous  contrast. COMPARISON:  Head CT 05/02/2015 FINDINGS: Diffusion imaging does not show any acute or subacute infarction. The brainstem and cerebellum are normal. Within the cerebral hemispheres, there is an old lacunar infarction in the right basal ganglia. There is an old cortical and subcortical infarction in the right parietal lobe. There is minimal chronic small vessel change of the white matter elsewhere. No large vessel territory infarction. No mass lesion, hemorrhage, hydrocephalus or extra-axial collection. No pituitary mass. No inflammatory sinus disease. No skull or skullbase lesion. IMPRESSION: No acute or reversible finding. Old lacunar infarction right basal ganglia. Old right parietal cortical and subcortical infarction. Electronically Signed   By: Nelson Chimes M.D.   On: 05/02/2015 17:01    EKG: Independently reviewed. Normal sinus rhythm   Assessment/Plan Active Problems:   TIA (transient ischemic attack)   Hypertension    TIA   will admit the patient for TIA workup. Obtain MRI brain, carotid Doppler, echocardiogram. Lipid profile in a.m.  Hypertension Mild uncontrolled hypertension. Continue metoprolol, valsartan  History of atrial fibrillation Continue Coumadin, INR is therapeutic  History of valve replacement Check echo in a.m. Patient has bioprosthetic heart valve Her cardiologist is in [place] She will need a cardiology in [place] at the time of discharge  Code status: Full code  Family discussion: Admission, patients condition and plan of care including tests being ordered have been discussed with the patient and her daughter who indicate understanding and agree with the plan and Code Status.   Time Spent on Admission: 60 min  [facility] Pager: [phone] 05/02/2015, 7:12 PM  If 7PM-7AM, please contact night-coverage  [email or web address]  Password [email or web address]

## 2015-05-02 NOTE — Progress Notes (Signed)
ANTICOAGULATION CONSULT NOTE - Initial Consult  Pharmacy Consult for Warfarin Indication: atrial fibrillation  Allergies  Allergen Reactions  . Penicillins Anaphylaxis    Has patient had a PCN reaction causing immediate rash, facial/tongue/throat swelling, SOB or lightheadedness with hypotension: Yes Has patient had a PCN reaction causing severe rash involving mucus membranes or skin necrosis: Yes Has patient had a PCN reaction that required hospitalization Yes Has patient had a PCN reaction occurring within the last 10 years: No If all of the above answers are "NO", then may proceed with Cephalosporin use.    Patient Measurements: Height: 5\' 2"  (157.5 cm) Weight: 149 lb 11.2 oz (67.903 kg) IBW/kg (Calculated) : 50.1  Vital Signs: Temp: 98.9 F (37.2 C) (02/06 1950) Temp Source: Oral (02/06 1950) BP: 197/82 mmHg (02/06 1950) Pulse Rate: 63 (02/06 1950)  Labs:  Recent Labs  05/02/15 1435  HGB 12.4  HCT 37.5  PLT 353  LABPROT 23.9*  INR 2.16*  CREATININE 0.99  TROPONINI <0.03   Estimated Creatinine Clearance: 46.4 mL/min (by C-G formula based on Cr of 0.99).  Medical History: Past Medical History  Diagnosis Date  . Hypertension   . Hypercholesteremia   . Diabetes mellitus without complication ([place])   . Fibromyalgia   . Macular degeneration   . CHF (congestive heart failure) ([place])   . Coronary artery disease    Medications:  Prescriptions prior to admission  Medication Sig Dispense Refill Last Dose  . aspirin EC 81 MG tablet Take 81 mg by mouth daily.   05/02/2015 at Unknown time  . butalbital-acetaminophen-caffeine (FIORICET, ESGIC) 50-325-40 MG tablet Take 1 tablet by mouth 2 (two) times daily as needed for headache.   unknown  . furosemide (LASIX) 20 MG tablet Take 20 mg by mouth daily.   05/02/2015 at Unknown time  . metFORMIN (GLUCOPHAGE) 500 MG tablet Take 500 mg by mouth 2 (two) times daily with a meal.   05/02/2015 at Unknown time  . metoprolol tartrate  (LOPRESSOR) 25 MG tablet Take 12.5 mg by mouth 2 (two) times daily.   05/02/2015 at [place]  . Multiple Vitamins-Minerals (PRESERVISION AREDS 2 PO) Take 1 capsule by mouth 2 (two) times daily.   05/02/2015 at Unknown time  . potassium chloride (K-DUR) 10 MEQ tablet Take 10 mEq by mouth daily.   05/02/2015 at Unknown time  . traMADol (ULTRAM) 50 MG tablet Take 50 mg by mouth every 4 (four) hours as needed for moderate pain.   unknown  . valsartan (DIOVAN) 80 MG tablet Take 80 mg by mouth at bedtime.   05/01/2015 at Unknown time  . warfarin (COUMADIN) 10 MG tablet Take 2 mg by mouth every evening. *Takes 10mg  and 2mg  tablets to complete dose of 12mg *   05/01/2015 at 2200  . warfarin (COUMADIN) 2 MG tablet Take 10 mg by mouth every evening. *Takes 10mg  and 2mg  tablets to complete dose of 12mg *   05/01/2015 at 2200   Assessment: Pt reports sudden onset of R sided facial numbness and R arm numbness that started 30 min PTA. Pt reports symptoms are now resolved. CT = No subdural, epidural, or subarachnoid hemorrhage identified on today's study. INR at goal, home dose above.    Goal of Therapy:  INR 2-3   Plan:  Warfarin 12mg  PO x 1 today Daily PT/INR Monitor for signs and symptoms of bleeding.   Pricilla Larsson 05/02/2015,8:04 PM

## 2015-05-02 NOTE — ED Notes (Signed)
Pt requesting medication for MRI.  Dr. Roderic Palau notified and orders received.

## 2015-05-02 NOTE — ED Notes (Signed)
Neuro exam normal at this time.  C/o right sided face numbness 30 min ago.  Was on the way to the hospital and symptoms resolved.  C/o feeling tired at this time.

## 2015-05-02 NOTE — ED Notes (Signed)
Pt reports sudden onset of R sided facial numbness and R arm numbness that started 30 min PTA. Pt reports symptoms are now resolved.

## 2015-05-02 NOTE — ED Notes (Signed)
No changes since arrival.

## 2015-05-02 NOTE — ED Provider Notes (Signed)
CSN: [id number]     Arrival date & time 05/02/15  1348 History   First MD Initiated Contact with Patient 05/02/15 1430     Chief Complaint  Patient presents with  . Numbness     (Consider location/radiation/quality/duration/timing/severity/associated sxs/prior Treatment) Patient is a 75 y.o. female presenting with weakness. The history is provided by the patient (The patient states that she had right facial numbness and numbness in her right arm with weakness in that hand for about 20 minutes today she is back to normal now).  Weakness This is a new problem. The current episode started 3 to 5 hours ago. The problem occurs rarely. The problem has been resolved. Pertinent negatives include no chest pain, no abdominal pain and no headaches. Nothing aggravates the symptoms. Nothing relieves the symptoms.    Past Medical History  Diagnosis Date  . Hypertension   . Hypercholesteremia   . Diabetes mellitus without complication ([place])   . Fibromyalgia   . Macular degeneration   . CHF (congestive heart failure) ([place])   . Coronary artery disease    Past Surgical History  Procedure Laterality Date  . Cholecystectomy    . Cardiac surgery    . Fracture surgery    . Joint replacement     Family History  Problem Relation Age of Onset  . Stroke Mother   . Heart failure Mother   . Diabetes Father   . Heart attack Father   . Stroke Father    Social History  Substance Use Topics  . Smoking status: Former Research scientist (life sciences)  . Smokeless tobacco: Never Used  . Alcohol Use: No   OB History    Gravida Para Term Preterm AB TAB SAB Ectopic Multiple Living   3 3 3       2      Review of Systems  Constitutional: Negative for appetite change and fatigue.  HENT: Negative for congestion, ear discharge and sinus pressure.   Eyes: Negative for discharge.  Respiratory: Negative for cough.   Cardiovascular: Negative for chest pain.  Gastrointestinal: Negative for abdominal pain and diarrhea.  Genitourinary:  Negative for frequency and hematuria.  Musculoskeletal: Negative for back pain.  Skin: Negative for rash.  Neurological: Positive for weakness. Negative for seizures and headaches.       Numbness face and hand  Psychiatric/Behavioral: Negative for hallucinations.      Allergies  Penicillins  Home Medications   Prior to Admission medications   Medication Sig Start Date End Date Taking? Authorizing Provider  aspirin EC 81 MG tablet Take 81 mg by mouth daily.   Yes Historical Provider, MD  butalbital-acetaminophen-caffeine (FIORICET, ESGIC) 50-325-40 MG tablet Take 1 tablet by mouth 2 (two) times daily as needed for headache.   Yes Historical Provider, MD  furosemide (LASIX) 20 MG tablet Take 20 mg by mouth daily.   Yes Historical Provider, MD  metFORMIN (GLUCOPHAGE) 500 MG tablet Take 500 mg by mouth 2 (two) times daily with a meal.   Yes Historical Provider, MD  metoprolol tartrate (LOPRESSOR) 25 MG tablet Take 12.5 mg by mouth 2 (two) times daily.   Yes Historical Provider, MD  Multiple Vitamins-Minerals (PRESERVISION AREDS 2 PO) Take 1 capsule by mouth 2 (two) times daily.   Yes Historical Provider, MD  potassium chloride (K-DUR) 10 MEQ tablet Take 10 mEq by mouth daily.   Yes Historical Provider, MD  traMADol (ULTRAM) 50 MG tablet Take 50 mg by mouth every 4 (four) hours as needed for moderate pain.  Yes Historical Provider, MD  valsartan (DIOVAN) 80 MG tablet Take 80 mg by mouth at bedtime.   Yes Historical Provider, MD  warfarin (COUMADIN) 10 MG tablet Take 2 mg by mouth every evening. *Takes 10mg  and 2mg  tablets to complete dose of 12mg *   Yes Historical Provider, MD  warfarin (COUMADIN) 2 MG tablet Take 10 mg by mouth every evening. *Takes 10mg  and 2mg  tablets to complete dose of 12mg *   Yes Historical Provider, MD   BP 171/73 mmHg  Pulse 102  Temp(Src) 99.1 F (37.3 C) (Oral)  Resp 18  Ht 5\' 2"  (1.575 m)  Wt 149 lb (67.586 kg)  BMI 27.25 kg/m2  SpO2 96% Physical Exam   Constitutional: She is oriented to person, place, and time. She appears well-developed.  HENT:  Head: Normocephalic.  Eyes: Conjunctivae and EOM are normal. No scleral icterus.  Neck: Neck supple. No thyromegaly present.  Cardiovascular: Normal rate and regular rhythm.  Exam reveals no gallop and no friction rub.   No murmur heard. Pulmonary/Chest: No stridor. She has no wheezes. She has no rales. She exhibits no tenderness.  Abdominal: She exhibits no distension. There is no tenderness. There is no rebound.  Musculoskeletal: Normal range of motion. She exhibits no edema.  Lymphadenopathy:    She has no cervical adenopathy.  Neurological: She is oriented to person, place, and time. She exhibits normal muscle tone. Coordination normal.  Skin: No rash noted. No erythema.  Psychiatric: She has a normal mood and affect. Her behavior is normal.    ED Course  Procedures (including critical care time) Labs Review Labs Reviewed  COMPREHENSIVE METABOLIC PANEL - Abnormal; Notable for the following:    Glucose, Bld 155 (*)    BUN 24 (*)    GFR calc non Af Amer 56 (*)    All other components within normal limits  URINALYSIS, ROUTINE W REFLEX MICROSCOPIC (NOT AT [facility]) - Abnormal; Notable for the following:    Leukocytes, UA TRACE (*)    All other components within normal limits  PROTIME-INR - Abnormal; Notable for the following:    Prothrombin Time 23.9 (*)    INR 2.16 (*)    All other components within normal limits  URINE MICROSCOPIC-ADD ON - Abnormal; Notable for the following:    Squamous Epithelial / LPF 0-5 (*)    Bacteria, UA MANY (*)    All other components within normal limits  CBC WITH DIFFERENTIAL/PLATELET  TROPONIN I    Imaging Review Dg Chest 2 View  05/02/2015  CLINICAL DATA:  Right-sided numbness. EXAM: CHEST  2 VIEW COMPARISON:  None. FINDINGS: Mitral valve and aortic valve replacement. Heart size upper normal. Negative for heart failure. Blunting left costophrenic  angle likely due to scarring or small effusion. No prior studies for comparison. Negative for pulmonary edema Negative for pneumonia or mass lesion. Mild hyperinflation of the lungs. IMPRESSION: Postop mitral valve and aortic valve replacement. Left pleural scarring/ fluid. Negative for heart failure. Electronically Signed   By: Franchot Gallo M.D.   On: 05/02/2015 14:37   Ct Head Wo Contrast  05/02/2015  CLINICAL DATA:  Right-sided face numbness started 30 minutes ago. EXAM: CT HEAD WITHOUT CONTRAST TECHNIQUE: Contiguous axial images were obtained from the base of the skull through the vertex without intravenous contrast. COMPARISON:  None. FINDINGS: Paranasal sinuses, mastoid air cells, bones, and extracranial soft tissues are within normal limits. No subdural, epidural, or subarachnoid hemorrhage identified on today's study. Low attenuation in the right  parietal subcortical white matter such as on axial image 20 is identified. There is also age indeterminate low-attenuation in the right basal ganglia, extending into the corona radiata. The white matter is otherwise normal. No acute cortical infarct is identified. No mass, mass effect, or midline shift. Ventricles and sulci are normal. Cerebellum, brainstem, and basal cisterns are within normal limits. IMPRESSION: There is age indeterminate subcortical decreased attenuation in the right parietal lobe. There is a similar but smaller finding in the white matter adjacent to the right basal ganglia. The findings likely represent age indeterminate white matter ischemic disease. An MRI could further characterize the age of these findings. Electronically Signed   By: Dorise Bullion III M.D   On: 05/02/2015 15:01   Mr Brain Wo Contrast  05/02/2015  CLINICAL DATA:  Right-sided facial numbness, 1 day duration. Headache. EXAM: MRI HEAD WITHOUT CONTRAST TECHNIQUE: Multiplanar, multiecho pulse sequences of the brain and surrounding structures were obtained without  intravenous contrast. COMPARISON:  Head CT 05/02/2015 FINDINGS: Diffusion imaging does not show any acute or subacute infarction. The brainstem and cerebellum are normal. Within the cerebral hemispheres, there is an old lacunar infarction in the right basal ganglia. There is an old cortical and subcortical infarction in the right parietal lobe. There is minimal chronic small vessel change of the white matter elsewhere. No large vessel territory infarction. No mass lesion, hemorrhage, hydrocephalus or extra-axial collection. No pituitary mass. No inflammatory sinus disease. No skull or skullbase lesion. IMPRESSION: No acute or reversible finding. Old lacunar infarction right basal ganglia. Old right parietal cortical and subcortical infarction. Electronically Signed   By: Nelson Chimes M.D.   On: 05/02/2015 17:01   I have personally reviewed and evaluated these images and lab results as part of my medical decision-making.   EKG Interpretation   Date/Time:  Monday May 02 2015 13:59:37 EST Ventricular Rate:  59 PR Interval:  147 QRS Duration: 103 QT Interval:  510 QTC Calculation: 505 R Axis:   66 Text Interpretation:  Sinus rhythm Ventricular bigeminy Abnormal inferior  Q waves Borderline ST depression, diffuse leads Prolonged QT interval  Confirmed by Glenn Gullickson  MD, Kyaire Gruenewald [phone]) on 05/02/2015 6:01:21 PM      MDM   Final diagnoses:  Transient cerebral ischemia, unspecified transient cerebral ischemia type    Patient will be admitted for TIA workup    Milton Ferguson, MD 05/02/15 Vernelle Emerald

## 2015-05-03 ENCOUNTER — Observation Stay (HOSPITAL_BASED_OUTPATIENT_CLINIC_OR_DEPARTMENT_OTHER): Payer: Medicare Other

## 2015-05-03 ENCOUNTER — Observation Stay (HOSPITAL_COMMUNITY): Payer: Medicare Other

## 2015-05-03 DIAGNOSIS — G459 Transient cerebral ischemic attack, unspecified: Secondary | ICD-10-CM | POA: Diagnosis not present

## 2015-05-03 DIAGNOSIS — R208 Other disturbances of skin sensation: Secondary | ICD-10-CM | POA: Diagnosis not present

## 2015-05-03 DIAGNOSIS — I10 Essential (primary) hypertension: Secondary | ICD-10-CM | POA: Diagnosis not present

## 2015-05-03 DIAGNOSIS — I6523 Occlusion and stenosis of bilateral carotid arteries: Secondary | ICD-10-CM | POA: Diagnosis not present

## 2015-05-03 DIAGNOSIS — R200 Anesthesia of skin: Secondary | ICD-10-CM | POA: Diagnosis not present

## 2015-05-03 DIAGNOSIS — R202 Paresthesia of skin: Secondary | ICD-10-CM | POA: Diagnosis not present

## 2015-05-03 LAB — LIPID PANEL
CHOL/HDL RATIO: 3.1 ratio
CHOLESTEROL: 161 mg/dL (ref 0–200)
HDL: 52 mg/dL (ref >40)
LDL Cholesterol: 81 mg/dL (ref 0–99)
Triglycerides: 142 mg/dL (ref <150)
VLDL: 28 mg/dL (ref 0–40)

## 2015-05-03 LAB — GLUCOSE, CAPILLARY
GLUCOSE-CAPILLARY: 129 mg/dL — AB (ref 65–99)
Glucose-Capillary: 82 mg/dL (ref 65–99)
Glucose-Capillary: 92 mg/dL (ref 65–99)

## 2015-05-03 LAB — PROTIME-INR
INR: 2.06 — ABNORMAL HIGH (ref 0.00–1.49)
Prothrombin Time: 23 seconds — ABNORMAL HIGH (ref 11.6–15.2)

## 2015-05-03 MED ORDER — VALSARTAN 160 MG PO TABS: 160.0000 mg | ORAL_TABLET | Freq: Every day | ORAL | Status: DC

## 2015-05-03 MED ORDER — HYDRALAZINE HCL 20 MG/ML IJ SOLN
5.0000 mg | Freq: Once | INTRAMUSCULAR | Status: AC
  Administered 2015-05-03: 5 mg via INTRAVENOUS
  Filled 2015-05-03: qty 1

## 2015-05-03 MED ORDER — LORAZEPAM 2 MG/ML IJ SOLN
1.0000 mg | Freq: Once | INTRAMUSCULAR | Status: AC
  Administered 2015-05-03: 1 mg via INTRAVENOUS
  Filled 2015-05-03: qty 1

## 2015-05-03 MED ORDER — WARFARIN SODIUM 5 MG PO TABS
12.0000 mg | ORAL_TABLET | Freq: Once | ORAL | Status: AC
  Administered 2015-05-03: 12 mg via ORAL
  Filled 2015-05-03: qty 2

## 2015-05-03 NOTE — Progress Notes (Signed)
*  PRELIMINARY RESULTS* Echocardiogram 2D Echocardiogram has been performed.  Leavy Cella 05/03/2015, 4:15 PM

## 2015-05-03 NOTE — Evaluation (Signed)
Occupational Therapy Evaluation Patient Details Name: Jeanette Yates MRN: [id number] DOB: July 18, 1940 Today's Date: 05/03/2015    History of Present Illness 75 year old female who  has a past medical history of Hypertension; Hypercholesteremia; Diabetes mellitus without complication ([place]); Fibromyalgia; Macular degeneration; CHF (congestive heart failure) ([place]); and Coronary artery disease.   Clinical Impression   Pt awake, alert, oriented x4 this am, agreeable to evaluation. Pt is independent in ADL task completion, does require assistance for some advanced tasks at home due to vision impairment from macular degeneration. Discussed compensatory strategies for vision deficits, educated pt on available assistive devices. Pt is at baseline with function, all symptoms have resolved. No further acute OT services.     Follow Up Recommendations  No OT follow up    Equipment Recommendations  None recommended by OT       Precautions / Restrictions Precautions Precautions: None Restrictions Weight Bearing Restrictions: No      Mobility Bed Mobility Overal bed mobility: Independent                Transfers Overall transfer level: Independent                         ADL Overall ADL's : Modified independent     Grooming: Wash/dry hands;Wash/dry face;Oral care;Modified independent;Standing           Upper Body Dressing : Set up;Sitting (set-up for hospital gown sleeves)                   Functional mobility during ADLs: Independent                 Pertinent Vitals/Pain Pain Assessment: No/denies pain     Hand Dominance Right   Extremity/Trunk Assessment Upper Extremity Assessment Upper Extremity Assessment: Overall WFL for tasks assessed   Lower Extremity Assessment Lower Extremity Assessment: Defer to PT evaluation       Communication Communication Communication: No difficulties   Cognition Arousal/Alertness: Awake/alert Behavior  During Therapy: WFL for tasks assessed/performed Overall Cognitive Status: Within Functional Limits for tasks assessed                                Home Living Family/patient expects to be discharged to:: Private residence Living Arrangements: Children (son & daughter-in-law) Available Help at Discharge: Family;Available 24 hours/day               Bathroom Shower/Tub: Teacher, early years/pre: Standard     Home Equipment: None          Prior Functioning/Environment Level of Independence: Needs assistance    ADL's / Homemaking Assistance Needed: Pt requires assistance with some household tasks and meal prep due to vision impairment from macular degeneration         End of Session    Activity Tolerance: Patient tolerated treatment well Patient left: in chair;with call bell/phone within reach   Time: 0830-0846 OT Time Calculation (min): 16 min Charges:  OT General Charges $OT Visit: 1 Procedure OT Evaluation $OT Eval Low Complexity: 1 Procedure G-Codes: OT G-codes **NOT FOR INPATIENT CLASS** Functional Assessment Tool Used: clinical judgement Functional Limitation: Self care Self Care Current Status [id number]): At least 20 percent but less than 40 percent impaired, limited or restricted Self Care Goal Status [id number]): At least 20 percent but less than 40 percent impaired, limited or restricted Self Care Discharge Status [phone]): At  least 20 percent but less than 40 percent impaired, limited or restricted  Guadelupe Sabin, OTR/L  [phone]  05/03/2015, 11:56 AM

## 2015-05-03 NOTE — Evaluation (Signed)
Physical Therapy Evaluation Patient Details Name: Jeanette Yates MRN: [id number] DOB: 1940-05-31 Today's Date: 05/03/2015   History of Present Illness  75yo white female who comes to [facility] after sudden onset R facial and R UE numbness, as well as some R knee instability. All symptoms resolved prior to arrival. Head CT/MRI negative at time of eval for acute changes, but 2 remote infarcts noted. Pt is in process of selling house in [place] to relocate to [place] with family. At baseline, pt is active, taking  regular walks with dog up to 3-4 miles. Pt uses a SPC on days wherein her chonic LBP is exacerbated.   Clinical Impression  Pt is received semirecumbent in bed upon entry, awake, alert, and willing to participate. No acute distress noted.  Pt is A&Ox3 and pleasant. Pt reports three falls in the last 6 months, which she reports is preceded by sudden onset of lightheadedness associated with quick postural changes. Pt reports she is on metoprolol, denies recent dosage changes, and reports poor fluid intake. I explained the importance of remaining well hydrated and that she should be patient with transient lightheaded episodes associated c orthostasis.Pt strength as screened during functional mobility assessment demonstrates modI mobility, however lacks sufficient strength for gait speed variability and falls recovery. Pt falls risk is high as evidenced by slow gait speed, poor forward reach, and SLS of <10s on L (R is >25s). At time of evaluation pt reports all CC has resolved. Pt is safe for return to home and is agreeable to OPPT referral for gait training and balance training. Patient presenting with impairment of strength, balance, and activity tolerance, limiting ability to perform community distance mobility at safe appropriate speeds. Patient will benefit from skilled intervention to address the above impairments and limitations, in order to restore to prior level of function, improve patient safety upon discharge,  and to decrease falls risk. No additional PT services needed at this venue of care; PT will sign off.      Follow Up Recommendations Outpatient PT    Equipment Recommendations  None recommended by PT    Recommendations for Other Services       Precautions / Restrictions Precautions Precautions: None Restrictions Weight Bearing Restrictions: No      Mobility  Bed Mobility Overal bed mobility: Independent                Transfers Overall transfer level: Independent                  Ambulation/Gait Ambulation/Gait assistance: Min guard (supervision due to recent ativan dose. ) Ambulation Distance (Feet): 150 Feet Assistive device: None     Gait velocity interpretation: <1.8 ft/sec, indicative of risk for recurrent falls General Gait Details: wide based gait, poor pelvic stability with mild bilat trendelenburg; very poor gait speed variability indicative of poor falls recovery.   Stairs            Wheelchair Mobility    Modified Rankin (Stroke Patients Only)       Balance Overall balance assessment: Modified Independent               Single Leg Stance - Right Leg: 26 Single Leg Stance - Left Leg: 7     Rhomberg - Eyes Opened: 10 Rhomberg - Eyes Closed: 10   High Level Balance Comments: forward reach appears unstead;              Pertinent Vitals/Pain Pain Assessment: No/denies pain  Home Living Family/patient expects to be discharged to:: Private residence Living Arrangements: Children Available Help at Discharge: Family;Available 24 hours/day Type of Home: House Home Access: Ramped entrance     Home Layout: One level Home Equipment: Cane - single point (Has RW, AC in PA at home. )      Prior Function Level of Independence: Needs assistance      ADL's / Homemaking Assistance Needed: Pt requires assistance with some household tasks and meal prep due to vision impairment from macular degeneration        Hand  Dominance   Dominant Hand: Right    Extremity/Trunk Assessment   Upper Extremity Assessment: Defer to OT evaluation           Lower Extremity Assessment: Generalized weakness;Overall [facility] for tasks assessed (weakness most noted in poor variablity of gait. )         Communication   Communication: No difficulties  Cognition Arousal/Alertness:  (drowsy, pt reporting she recently had a dose of ativan. ) Behavior During Therapy: WFL for tasks assessed/performed Overall Cognitive Status: Within Functional Limits for tasks assessed                      General Comments      Exercises        Assessment/Plan    PT Assessment All further PT needs can be met in the next venue of care  PT Diagnosis Generalized weakness;Abnormality of gait   PT Problem List Decreased strength;Decreased balance;Decreased mobility  PT Treatment Interventions     PT Goals (Current goals can be found in the Care Plan section) Acute Rehab PT Goals Patient Stated Goal: Remain indep and improve gait, reduce falls PT Goal Formulation: With patient/family Time For Goal Achievement: 05/17/15 Potential to Achieve Goals: Good    Frequency     Barriers to discharge        Co-evaluation               End of Session Equipment Utilized During Treatment: Gait belt Activity Tolerance: Patient tolerated treatment well;Patient limited by lethargy Patient left: in bed;with family/visitor present;with call bell/phone within reach Nurse Communication: Other (comment)    Functional Limitation: Mobility: Walking and moving around Mobility: Walking and Moving Around Current Status ([id number]): At least 20 percent but less than 40 percent impaired, limited or restricted Mobility: Walking and Moving Around Goal Status [phone]): At least 20 percent but less than 40 percent impaired, limited or restricted Mobility: Walking and Moving Around Discharge Status [phone]): At least 20 percent but less than 40  percent impaired, limited or restricted    Time: 1323-1349 PT Time Calculation (min) (ACUTE ONLY): 26 min   Charges:   PT Evaluation $PT Eval Low Complexity: 1 Procedure PT Treatments $Therapeutic Activity: 8-22 mins   PT G Codes:   PT G-Codes **NOT FOR INPATIENT CLASS** Functional Limitation: Mobility: Walking and moving around Mobility: Walking and Moving Around Current Status ([id number]): At least 20 percent but less than 40 percent impaired, limited or restricted Mobility: Walking and Moving Around Goal Status [phone]): At least 20 percent but less than 40 percent impaired, limited or restricted Mobility: Walking and Moving Around Discharge Status [phone]): At least 20 percent but less than 40 percent impaired, limited or restricted    2:42 PM, 05/03/2015 Etta Grandchild, PT, DPT PRN Physical Therapist at [place] License # [id number] [phone] (wireless)  [phone] (mobile)

## 2015-05-03 NOTE — Progress Notes (Signed)
SLP Cancellation Note  Patient Details Name: Jeanette Yates MRN: [id number] DOB: Apr 20, 1940   Cancelled treatment:       Reason Eval/Treat Not Completed: SLP screened, no needs identified, will sign off;Other (comment); SLP spoke with pt and family this AM. Pt passed RN swallow screen and denies recent changes in cognition, speech, or language skills. Pt does endorse occasional globus sensation in pharynx that has been present on and off over the years. She recently relocated here from [place] and is in the process of obtaining a PCP and GI doctor. She had EGD with dilation about 3 years ago per pt report. Daughter reports that pt seemed more confused over the past few days and has had a few falls since the spring. Her husband recently passed away so pt states she doesn't know what is related to stress or actual changes. MRI shows remote infarct age. Pt is interested in outpatient PT for balance. SLP advised pt/family to seek outpatient f/u GI if continued globus sensation and/or possible SLP f/u as outpatient if needed. Pt/family in agreement.  Thank you,  Genene Churn, [place]    [place] 05/03/2015, 12:38 PM

## 2015-05-03 NOTE — Discharge Summary (Signed)
Physician Discharge Summary  Jeanette Yates [id number] DOB: December 12, 1940 DOA: 05/02/2015  PCP: PROVIDER NOT IN SYSTEM  Admit date: 05/02/2015 Discharge date: 05/03/2015  Time spent: 20 minutes  Recommendations for Outpatient Follow-up:  1. Follow up with PCP 1-2 weeks   Discharge Diagnoses:  Principal Problem:   Numbness on right side Active Problems:   Hypertension   Discharge Condition: Stable  Diet recommendation: Heart Healthy  Filed Weights   05/02/15 1357 05/02/15 1950  Weight: 67.586 kg (149 lb) 67.903 kg (149 lb 11.2 oz)    History of present illness:  Please review dictated H and P from 2/6 for details. Briefly, 75yo with hx of HTN, DM, fibromyalgia presents with R facial and hand numbness with lethargy. In the ED, CT head was unremarkable. MRI was neg for CVA. Patient was admitted for continued stroke work up.  Hospital Course:  Right sided numbness Patient was admitted for TIA workup. MRI brain, without acute CVA Carotid Dopplers with mild R sided disease in the 50-69% range Echocardiogram revealed EF of 55% with normal functioning bioprosthetic valve and no wall motion abnormality   Hypertension Mild uncontrolled hypertension. Continue metoprolol, valsartan  History of atrial fibrillation Continue Coumadin, INR is therapeutic  History of valve replacement 2d echo per above Patient has bioprosthetic heart valve Her cardiologist is in [place] She will need a cardiology in [place] at the time of discharge  Discharge Exam: Filed Vitals:   05/03/15 0650 05/03/15 0850 05/03/15 1250 05/03/15 1650  BP: 176/72 135/69 110/48 170/87  Pulse: 60 65 102 57  Temp: 97.9 F (36.6 C) 98.7 F (37.1 C) 98 F (36.7 C) 98.2 F (36.8 C)  TempSrc: Oral Oral Oral Oral  Resp: 18 18 18 18   Height:      Weight:      SpO2: 100% 99% 97% 100%    General: Awake, in nad Cardiovascular: regular, s1, s2 Respiratory: normal resp effort, no wheeinzg  Discharge  Instructions     Medication List    TAKE these medications        aspirin EC 81 MG tablet  Take 81 mg by mouth daily.     butalbital-acetaminophen-caffeine 50-325-40 MG tablet  Commonly known as:  FIORICET, ESGIC  Take 1 tablet by mouth 2 (two) times daily as needed for headache.     furosemide 20 MG tablet  Commonly known as:  LASIX  Take 20 mg by mouth daily.     metFORMIN 500 MG tablet  Commonly known as:  GLUCOPHAGE  Take 500 mg by mouth 2 (two) times daily with a meal.     metoprolol tartrate 25 MG tablet  Commonly known as:  LOPRESSOR  Take 12.5 mg by mouth 2 (two) times daily.     potassium chloride 10 MEQ tablet  Commonly known as:  K-DUR  Take 10 mEq by mouth daily.     PRESERVISION AREDS 2 PO  Take 1 capsule by mouth 2 (two) times daily.     traMADol 50 MG tablet  Commonly known as:  ULTRAM  Take 50 mg by mouth every 4 (four) hours as needed for moderate pain.     valsartan 160 MG tablet  Commonly known as:  DIOVAN  Take 1 tablet (160 mg total) by mouth daily.     warfarin 10 MG tablet  Commonly known as:  COUMADIN  Take 2 mg by mouth every evening. *Takes 10mg  and 2mg  tablets to complete dose of 12mg *     warfarin 2  MG tablet  Commonly known as:  COUMADIN  Take 10 mg by mouth every evening. *Takes 10mg  and 2mg  tablets to complete dose of 12mg *       Allergies  Allergen Reactions  . Penicillins Anaphylaxis    Has patient had a PCN reaction causing immediate rash, facial/tongue/throat swelling, SOB or lightheadedness with hypotension: Yes Has patient had a PCN reaction causing severe rash involving mucus membranes or skin necrosis: Yes Has patient had a PCN reaction that required hospitalization Yes Has patient had a PCN reaction occurring within the last 10 years: No If all of the above answers are "NO", then may proceed with Cephalosporin use.    Follow-up Information    Follow up with Follow up with your PCP in 1-2 weeks.   Why:  Hospital  follow up      Schedule an appointment as soon as possible for a visit with Herminio Commons, MD.   Specialty:  Cardiology   Why:  Hospital follow up   Contact information:   [place] [address] [phone]        The results of significant diagnostics from this hospitalization (including imaging, microbiology, ancillary and laboratory) are listed below for reference.    Significant Diagnostic Studies: Dg Chest 2 View  05/02/2015  CLINICAL DATA:  Right-sided numbness. EXAM: CHEST  2 VIEW COMPARISON:  None. FINDINGS: Mitral valve and aortic valve replacement. Heart size upper normal. Negative for heart failure. Blunting left costophrenic angle likely due to scarring or small effusion. No prior studies for comparison. Negative for pulmonary edema Negative for pneumonia or mass lesion. Mild hyperinflation of the lungs. IMPRESSION: Postop mitral valve and aortic valve replacement. Left pleural scarring/ fluid. Negative for heart failure. Electronically Signed   By: Franchot Gallo M.D.   On: 05/02/2015 14:37   Ct Head Wo Contrast  05/02/2015  CLINICAL DATA:  Right-sided face numbness started 30 minutes ago. EXAM: CT HEAD WITHOUT CONTRAST TECHNIQUE: Contiguous axial images were obtained from the base of the skull through the vertex without intravenous contrast. COMPARISON:  None. FINDINGS: Paranasal sinuses, mastoid air cells, bones, and extracranial soft tissues are within normal limits. No subdural, epidural, or subarachnoid hemorrhage identified on today's study. Low attenuation in the right parietal subcortical white matter such as on axial image 20 is identified. There is also age indeterminate low-attenuation in the right basal ganglia, extending into the corona radiata. The white matter is otherwise normal. No acute cortical infarct is identified. No mass, mass effect, or midline shift. Ventricles and sulci are normal. Cerebellum, brainstem, and basal cisterns are within normal  limits. IMPRESSION: There is age indeterminate subcortical decreased attenuation in the right parietal lobe. There is a similar but smaller finding in the white matter adjacent to the right basal ganglia. The findings likely represent age indeterminate white matter ischemic disease. An MRI could further characterize the age of these findings. Electronically Signed   By: Dorise Bullion III M.D   On: 05/02/2015 15:01   Mr Jodene Nam Head Wo Contrast  05/03/2015  CLINICAL DATA:  75 year old hypertensive female with right-sided facial numbness and headache. Subsequent encounter. EXAM: MRA HEAD WITHOUT CONTRAST TECHNIQUE: Angiographic images of the Circle of Willis were obtained using MRA technique without intravenous contrast. COMPARISON:  05/02/2015 brain MR. No comparison MR angiogram. FINDINGS: Mild narrowing cavernous segment internal carotid artery bilaterally. Mild moderate focal narrowing A1 segment right anterior cerebral artery. Middle cerebral artery mild branch vessel irregularity. Right vertebral artery is  dominant without significant narrowing. Mild narrowing distal left vertebral artery proximal to the posterior inferior cerebellar artery takeoff. Nonvisualized right posterior inferior cerebellar artery. Narrowing mid to distal left posterior inferior cerebellar artery. Slight irregularity with minimal narrowing basilar artery without high-grade stenosis. Mild narrowing proximal and distal aspect anterior cerebral artery bilaterally. Mild to moderate tandem stenosis proximal to mid right posterior cerebral artery. Narrowing and irregularity distal branches of the posterior cerebral artery bilaterally. No aneurysm noted. IMPRESSION: Mild narrowing cavernous segment internal carotid artery bilaterally. Mild to moderate focal narrowing A1 segment right anterior cerebral artery. Middle cerebral artery mild branch vessel irregularity. Right vertebral artery is dominant without significant narrowing. Mild narrowing  distal left vertebral artery proximal to the posterior inferior cerebellar artery takeoff. Nonvisualized right posterior inferior cerebellar artery. Narrowing mid to distal left posterior inferior cerebellar artery. Slight irregularity with minimal narrowing basilar artery without high-grade stenosis. Mild narrowing proximal and distal aspect anterior cerebral artery bilaterally. Mild to moderate tandem stenosis proximal to mid right posterior cerebral artery. Narrowing and irregularity distal branches of the posterior cerebral artery bilaterally. Electronically Signed   By: Genia Del M.D.   On: 05/03/2015 13:38   Mr Brain Wo Contrast  05/02/2015  CLINICAL DATA:  Right-sided facial numbness, 1 day duration. Headache. EXAM: MRI HEAD WITHOUT CONTRAST TECHNIQUE: Multiplanar, multiecho pulse sequences of the brain and surrounding structures were obtained without intravenous contrast. COMPARISON:  Head CT 05/02/2015 FINDINGS: Diffusion imaging does not show any acute or subacute infarction. The brainstem and cerebellum are normal. Within the cerebral hemispheres, there is an old lacunar infarction in the right basal ganglia. There is an old cortical and subcortical infarction in the right parietal lobe. There is minimal chronic small vessel change of the white matter elsewhere. No large vessel territory infarction. No mass lesion, hemorrhage, hydrocephalus or extra-axial collection. No pituitary mass. No inflammatory sinus disease. No skull or skullbase lesion. IMPRESSION: No acute or reversible finding. Old lacunar infarction right basal ganglia. Old right parietal cortical and subcortical infarction. Electronically Signed   By: Nelson Chimes M.D.   On: 05/02/2015 17:01   US Carotid Bilateral  05/03/2015  CLINICAL DATA:  Right-sided numbness.  Vertigo. EXAM: BILATERAL CAROTID DUPLEX ULTRASOUND TECHNIQUE: Pearline Cables scale imaging, color Doppler and duplex ultrasound were performed of bilateral carotid and vertebral  arteries in the neck. COMPARISON:  MRI 05/02/1998 17.  CT 05/02/2015. FINDINGS: Criteria: Quantification of carotid stenosis is based on velocity parameters that correlate the residual internal carotid diameter with NASCET-based stenosis levels, using the diameter of the distal internal carotid lumen as the denominator for stenosis measurement. The following velocity measurements were obtained: RIGHT ICA:  129/19 cm/sec CCA:  123456 cm/sec SYSTOLIC ICA/CCA RATIO:  1.9 DIASTOLIC ICA/CCA RATIO:  1.5 ECA:  134 cm/sec LEFT ICA:  99/21 cm/sec CCA:  [id number] cm/sec SYSTOLIC ICA/CCA RATIO:  1.0 DIASTOLIC ICA/CCA RATIO:  1.4 ECA:  176 cm/sec RIGHT CAROTID ARTERY: Moderate right carotid bifurcation atherosclerotic vascular disease. Flow velocity ratios elevated. Visually degree of stenosis is in the 50-69% range . RIGHT VERTEBRAL ARTERY:  Patent with antegrade flow. LEFT CAROTID ARTERY: Mild left carotid bifurcation atherosclerotic vascular disease. Degree of stenosis less than 50%. LEFT VERTEBRAL ARTERY:  Patent with antegrade flow. IMPRESSION: 1. Moderate right carotid bifurcation atherosclerotic vascular disease. Visually degree of stenosis in the 50-69% range. Elevation of flow velocity ratios also present. 2. Mild left carotid bifurcation atherosclerotic vascular disease. Degree of stenosis less than 50%. 3.  Vertebral arteries are patent with  antegrade flow. Electronically Signed   By: Marcello Moores  Register   On: 05/03/2015 14:14    Microbiology: Recent Results (from the past 240 hour(s))  Urine culture     Status: None (Preliminary result)   Collection Time: 05/02/15  2:15 PM  Result Value Ref Range Status   Specimen Description URINE, CLEAN CATCH  Final   Special Requests NONE  Final   Culture   Final    >=100,000 COLONIES/mL ESCHERICHIA COLI Performed at [facility]    Report Status PENDING  Incomplete     Labs: Basic Metabolic Panel:  Recent Labs Lab 05/02/15 1435  NA 141  K 3.6  CL 104   CO2 25  GLUCOSE 155*  BUN 24*  CREATININE 0.99  CALCIUM 9.2   Liver Function Tests:  Recent Labs Lab 05/02/15 1435  AST 23  ALT 16  ALKPHOS 97  BILITOT 0.4  PROT 7.0  ALBUMIN 3.9   No results for input(s): LIPASE, AMYLASE in the last 168 hours. No results for input(s): AMMONIA in the last 168 hours. CBC:  Recent Labs Lab 05/02/15 1435  WBC 9.2  NEUTROABS 6.1  HGB 12.4  HCT 37.5  MCV 84.7  PLT 353   Cardiac Enzymes:  Recent Labs Lab 05/02/15 1435  TROPONINI <0.03   BNP: BNP (last 3 results) No results for input(s): BNP in the last 8760 hours.  ProBNP (last 3 results) No results for input(s): PROBNP in the last 8760 hours.  CBG:  Recent Labs Lab 05/02/15 1843 05/03/15 0721 05/03/15 1106 05/03/15 1644  GLUCAP 118* 82 129* 92    Signed:  Coltyn Hanning K  [facility] 05/03/2015, 6:19 PM

## 2015-05-03 NOTE — Progress Notes (Signed)
ANTICOAGULATION CONSULT NOTE - follow up  Pharmacy Consult for Warfarin Indication: atrial fibrillation  Allergies  Allergen Reactions  . Penicillins Anaphylaxis    Has patient had a PCN reaction causing immediate rash, facial/tongue/throat swelling, SOB or lightheadedness with hypotension: Yes Has patient had a PCN reaction causing severe rash involving mucus membranes or skin necrosis: Yes Has patient had a PCN reaction that required hospitalization Yes Has patient had a PCN reaction occurring within the last 10 years: No If all of the above answers are "NO", then may proceed with Cephalosporin use.    Patient Measurements: Height: 5\' 2"  (157.5 cm) Weight: 149 lb 11.2 oz (67.903 kg) IBW/kg (Calculated) : 50.1  Vital Signs: Temp: 98.7 F (37.1 C) (02/07 0850) Temp Source: Oral (02/07 0850) BP: 135/69 mmHg (02/07 0850) Pulse Rate: 65 (02/07 0850)  Labs:  Recent Labs  05/02/15 1435 05/03/15 0537  HGB 12.4  --   HCT 37.5  --   PLT 353  --   LABPROT 23.9* 23.0*  INR 2.16* 2.06*  CREATININE 0.99  --   TROPONINI <0.03  --    Estimated Creatinine Clearance: 46.4 mL/min (by C-G formula based on Cr of 0.99).  Medical History: Past Medical History  Diagnosis Date  . Hypertension   . Hypercholesteremia   . Diabetes mellitus without complication ([place])   . Fibromyalgia   . Macular degeneration   . CHF (congestive heart failure) ([place])   . Coronary artery disease    Medications:  Prescriptions prior to admission  Medication Sig Dispense Refill Last Dose  . aspirin EC 81 MG tablet Take 81 mg by mouth daily.   05/02/2015 at Unknown time  . butalbital-acetaminophen-caffeine (FIORICET, ESGIC) 50-325-40 MG tablet Take 1 tablet by mouth 2 (two) times daily as needed for headache.   unknown  . furosemide (LASIX) 20 MG tablet Take 20 mg by mouth daily.   05/02/2015 at Unknown time  . metFORMIN (GLUCOPHAGE) 500 MG tablet Take 500 mg by mouth 2 (two) times daily with a meal.    05/02/2015 at Unknown time  . metoprolol tartrate (LOPRESSOR) 25 MG tablet Take 12.5 mg by mouth 2 (two) times daily.   05/02/2015 at [place]  . Multiple Vitamins-Minerals (PRESERVISION AREDS 2 PO) Take 1 capsule by mouth 2 (two) times daily.   05/02/2015 at Unknown time  . potassium chloride (K-DUR) 10 MEQ tablet Take 10 mEq by mouth daily.   05/02/2015 at Unknown time  . traMADol (ULTRAM) 50 MG tablet Take 50 mg by mouth every 4 (four) hours as needed for moderate pain.   unknown  . valsartan (DIOVAN) 80 MG tablet Take 80 mg by mouth at bedtime.   05/01/2015 at Unknown time  . warfarin (COUMADIN) 10 MG tablet Take 2 mg by mouth every evening. *Takes 10mg  and 2mg  tablets to complete dose of 12mg *   05/01/2015 at 2200  . warfarin (COUMADIN) 2 MG tablet Take 10 mg by mouth every evening. *Takes 10mg  and 2mg  tablets to complete dose of 12mg *   05/01/2015 at 2200   Assessment: Pt reports sudden onset of R sided facial numbness and R arm numbness that started 30 min PTA. Pt reports symptoms are now resolved. CT = No subdural, epidural, or subarachnoid hemorrhage identified on today's study. INR at goal, home dose above.  No bleeding reported.   Goal of Therapy:  INR 2-3   Plan:  Warfarin 12mg  PO x 1 today (home dose per PTA med list) Daily PT/INR Monitor for  signs and symptoms of bleeding.   Hart Robinsons A 05/03/2015,1:24 PM

## 2015-05-03 NOTE — Progress Notes (Signed)
[facility] PROGRESS NOTE  Jeanette Yates [id number] DOB: 03-Apr-1940 DOA: 05/02/2015 PCP: PROVIDER NOT IN SYSTEM  HPI/Brief narrative 75yo with hx of HTN, DM, fibromyalgia presents with R facial and hand numbness with lethargy. In the ED, CT head was unremarkable. MRI was neg for CVA. Patient was admitted for continued stroke work up.  Assessment/Plan: Right sided numbness Patient was admitted for TIA workup. MRI brain, without acute CVA Carotid Dopplers with mild R sided disease in the 50-69% range Echocardiogram pending  Hypertension Mild uncontrolled hypertension. Continue metoprolol, valsartan  History of atrial fibrillation Continue Coumadin, INR is therapeutic  History of valve replacement 2d echo is pending Patient has bioprosthetic heart valve Her cardiologist is in [place] She will need a cardiology in [place] at the time of discharge  Code Status: Full Family Communication: Pt in room Disposition Plan: Possible d/c 2/8 if 2d echo unremarkable   Consultants:    Procedures:    Antibiotics: Anti-infectives    None      HPI/Subjective: No complaints this AM. Denies chest pain or abd pain  Objective: Filed Vitals:   05/03/15 0450 05/03/15 0650 05/03/15 0850 05/03/15 1250  BP: 180/60 176/72 135/69 110/48  Pulse: 56 60 65 102  Temp: 97.9 F (36.6 C) 97.9 F (36.6 C) 98.7 F (37.1 C) 98 F (36.7 C)  TempSrc: Oral Oral Oral Oral  Resp: 18 18 18 18   Height:      Weight:      SpO2: 100% 100% 99% 97%    Intake/Output Summary (Last 24 hours) at 05/03/15 1547 Last data filed at 05/03/15 1200  Gross per 24 hour  Intake    480 ml  Output      0 ml  Net    480 ml   Filed Weights   05/02/15 1357 05/02/15 1950  Weight: 67.586 kg (149 lb) 67.903 kg (149 lb 11.2 oz)    Exam:   General:  Awake, in nad  Cardiovascular: regular, s1, s2  Respiratory: normal resp effort, no wheezing  Abdomen: soft,nondistended  Musculoskeletal:  perfused, no clubbing   Data Reviewed: Basic Metabolic Panel:  Recent Labs Lab 05/02/15 1435  NA 141  K 3.6  CL 104  CO2 25  GLUCOSE 155*  BUN 24*  CREATININE 0.99  CALCIUM 9.2   Liver Function Tests:  Recent Labs Lab 05/02/15 1435  AST 23  ALT 16  ALKPHOS 97  BILITOT 0.4  PROT 7.0  ALBUMIN 3.9   No results for input(s): LIPASE, AMYLASE in the last 168 hours. No results for input(s): AMMONIA in the last 168 hours. CBC:  Recent Labs Lab 05/02/15 1435  WBC 9.2  NEUTROABS 6.1  HGB 12.4  HCT 37.5  MCV 84.7  PLT 353   Cardiac Enzymes:  Recent Labs Lab 05/02/15 1435  TROPONINI <0.03   BNP (last 3 results) No results for input(s): BNP in the last 8760 hours.  ProBNP (last 3 results) No results for input(s): PROBNP in the last 8760 hours.  CBG:  Recent Labs Lab 05/02/15 1843 05/03/15 0721 05/03/15 1106  GLUCAP 118* 82 129*    Recent Results (from the past 240 hour(s))  Urine culture     Status: None (Preliminary result)   Collection Time: 05/02/15  2:15 PM  Result Value Ref Range Status   Specimen Description URINE, CLEAN CATCH  Final   Special Requests NONE  Final   Culture   Final    >=100,000 COLONIES/mL ESCHERICHIA COLI Performed at [facility]  Hospital    Report Status PENDING  Incomplete     Studies: Dg Chest 2 View  05/02/2015  CLINICAL DATA:  Right-sided numbness. EXAM: CHEST  2 VIEW COMPARISON:  None. FINDINGS: Mitral valve and aortic valve replacement. Heart size upper normal. Negative for heart failure. Blunting left costophrenic angle likely due to scarring or small effusion. No prior studies for comparison. Negative for pulmonary edema Negative for pneumonia or mass lesion. Mild hyperinflation of the lungs. IMPRESSION: Postop mitral valve and aortic valve replacement. Left pleural scarring/ fluid. Negative for heart failure. Electronically Signed   By: Franchot Gallo M.D.   On: 05/02/2015 14:37   Ct Head Wo Contrast  05/02/2015   CLINICAL DATA:  Right-sided face numbness started 30 minutes ago. EXAM: CT HEAD WITHOUT CONTRAST TECHNIQUE: Contiguous axial images were obtained from the base of the skull through the vertex without intravenous contrast. COMPARISON:  None. FINDINGS: Paranasal sinuses, mastoid air cells, bones, and extracranial soft tissues are within normal limits. No subdural, epidural, or subarachnoid hemorrhage identified on today's study. Low attenuation in the right parietal subcortical white matter such as on axial image 20 is identified. There is also age indeterminate low-attenuation in the right basal ganglia, extending into the corona radiata. The white matter is otherwise normal. No acute cortical infarct is identified. No mass, mass effect, or midline shift. Ventricles and sulci are normal. Cerebellum, brainstem, and basal cisterns are within normal limits. IMPRESSION: There is age indeterminate subcortical decreased attenuation in the right parietal lobe. There is a similar but smaller finding in the white matter adjacent to the right basal ganglia. The findings likely represent age indeterminate white matter ischemic disease. An MRI could further characterize the age of these findings. Electronically Signed   By: Dorise Bullion III M.D   On: 05/02/2015 15:01   Mr Jodene Nam Head Wo Contrast  05/03/2015  CLINICAL DATA:  75 year old hypertensive female with right-sided facial numbness and headache. Subsequent encounter. EXAM: MRA HEAD WITHOUT CONTRAST TECHNIQUE: Angiographic images of the Circle of Willis were obtained using MRA technique without intravenous contrast. COMPARISON:  05/02/2015 brain MR. No comparison MR angiogram. FINDINGS: Mild narrowing cavernous segment internal carotid artery bilaterally. Mild moderate focal narrowing A1 segment right anterior cerebral artery. Middle cerebral artery mild branch vessel irregularity. Right vertebral artery is dominant without significant narrowing. Mild narrowing distal  left vertebral artery proximal to the posterior inferior cerebellar artery takeoff. Nonvisualized right posterior inferior cerebellar artery. Narrowing mid to distal left posterior inferior cerebellar artery. Slight irregularity with minimal narrowing basilar artery without high-grade stenosis. Mild narrowing proximal and distal aspect anterior cerebral artery bilaterally. Mild to moderate tandem stenosis proximal to mid right posterior cerebral artery. Narrowing and irregularity distal branches of the posterior cerebral artery bilaterally. No aneurysm noted. IMPRESSION: Mild narrowing cavernous segment internal carotid artery bilaterally. Mild to moderate focal narrowing A1 segment right anterior cerebral artery. Middle cerebral artery mild branch vessel irregularity. Right vertebral artery is dominant without significant narrowing. Mild narrowing distal left vertebral artery proximal to the posterior inferior cerebellar artery takeoff. Nonvisualized right posterior inferior cerebellar artery. Narrowing mid to distal left posterior inferior cerebellar artery. Slight irregularity with minimal narrowing basilar artery without high-grade stenosis. Mild narrowing proximal and distal aspect anterior cerebral artery bilaterally. Mild to moderate tandem stenosis proximal to mid right posterior cerebral artery. Narrowing and irregularity distal branches of the posterior cerebral artery bilaterally. Electronically Signed   By: Genia Del M.D.   On: 05/03/2015 13:38   Mr  Brain Wo Contrast  05/02/2015  CLINICAL DATA:  Right-sided facial numbness, 1 day duration. Headache. EXAM: MRI HEAD WITHOUT CONTRAST TECHNIQUE: Multiplanar, multiecho pulse sequences of the brain and surrounding structures were obtained without intravenous contrast. COMPARISON:  Head CT 05/02/2015 FINDINGS: Diffusion imaging does not show any acute or subacute infarction. The brainstem and cerebellum are normal. Within the cerebral hemispheres, there  is an old lacunar infarction in the right basal ganglia. There is an old cortical and subcortical infarction in the right parietal lobe. There is minimal chronic small vessel change of the white matter elsewhere. No large vessel territory infarction. No mass lesion, hemorrhage, hydrocephalus or extra-axial collection. No pituitary mass. No inflammatory sinus disease. No skull or skullbase lesion. IMPRESSION: No acute or reversible finding. Old lacunar infarction right basal ganglia. Old right parietal cortical and subcortical infarction. Electronically Signed   By: Nelson Chimes M.D.   On: 05/02/2015 17:01   US Carotid Bilateral  05/03/2015  CLINICAL DATA:  Right-sided numbness.  Vertigo. EXAM: BILATERAL CAROTID DUPLEX ULTRASOUND TECHNIQUE: Pearline Cables scale imaging, color Doppler and duplex ultrasound were performed of bilateral carotid and vertebral arteries in the neck. COMPARISON:  MRI 05/02/1998 17.  CT 05/02/2015. FINDINGS: Criteria: Quantification of carotid stenosis is based on velocity parameters that correlate the residual internal carotid diameter with NASCET-based stenosis levels, using the diameter of the distal internal carotid lumen as the denominator for stenosis measurement. The following velocity measurements were obtained: RIGHT ICA:  129/19 cm/sec CCA:  123456 cm/sec SYSTOLIC ICA/CCA RATIO:  1.9 DIASTOLIC ICA/CCA RATIO:  1.5 ECA:  134 cm/sec LEFT ICA:  99/21 cm/sec CCA:  [id number] cm/sec SYSTOLIC ICA/CCA RATIO:  1.0 DIASTOLIC ICA/CCA RATIO:  1.4 ECA:  176 cm/sec RIGHT CAROTID ARTERY: Moderate right carotid bifurcation atherosclerotic vascular disease. Flow velocity ratios elevated. Visually degree of stenosis is in the 50-69% range . RIGHT VERTEBRAL ARTERY:  Patent with antegrade flow. LEFT CAROTID ARTERY: Mild left carotid bifurcation atherosclerotic vascular disease. Degree of stenosis less than 50%. LEFT VERTEBRAL ARTERY:  Patent with antegrade flow. IMPRESSION: 1. Moderate right carotid bifurcation  atherosclerotic vascular disease. Visually degree of stenosis in the 50-69% range. Elevation of flow velocity ratios also present. 2. Mild left carotid bifurcation atherosclerotic vascular disease. Degree of stenosis less than 50%. 3.  Vertebral arteries are patent with antegrade flow. Electronically Signed   By: Marcello Moores  Register   On: 05/03/2015 14:14    Scheduled Meds: . aspirin EC  81 mg Oral Daily  . furosemide  20 mg Oral Daily  . insulin aspart  0-9 Units Subcutaneous TID WC  . irbesartan  75 mg Oral Daily  . metoprolol tartrate  12.5 mg Oral BID  . potassium chloride  10 mEq Oral Daily  . Warfarin - Pharmacist Dosing Inpatient   Does not apply Q24H   Continuous Infusions:   Active Problems:   TIA (transient ischemic attack)   Hypertension   Jassmine Vandruff, [facility] Pager [phone]. If 7PM-7AM, please contact night-coverage at [email or web address], password [facility] 05/03/2015, 3:47 PM

## 2015-05-03 NOTE — Plan of Care (Addendum)
Problem: Food- and Nutrition-Related Knowledge Deficit (NB-1.1) Goal: Nutrition education Formal process to instruct or train a patient/client in a skill or to impart knowledge to help patients/clients voluntarily manage or modify food choices and eating behavior to maintain or improve health. Outcome: Adequate for Discharge Brief Nutrition education note Pt presents with facial numbness and is having a stroke workup. Mild uncontrolled hypertension. Consulted to provide diet education. Reviewed with her foods that are high in sodium including but not limited to cheese, canned soups, bakery products as well as many frozen convenience meals. Provided her guideline recommendations for daily sodium intake (suggesting approximately 500 mg sodium per meal) and  with a large print handout which outlines the DASH (Dietary Approaches to Stop Hypertension) eating plan.  Colman Cater MS,RD,CSG,LDN Office: [phone] Pager: [phone]

## 2015-05-03 NOTE — Care Management Note (Signed)
Case Management Note  Patient Details  Name: Jeanette Yates Surgeon MRN: [id number] Date of Birth: Feb 09, 1941  Subjective/Objective:                  Pt is from home, lives with son and is ind with ALD's. Pt plans to return home with self care at DC.   Action/Plan: No CM needs.   Expected Discharge Date:  05/05/15               Expected Discharge Plan:  Home/Self Care  In-House Referral:  NA  Discharge planning Services  CM Consult  Post Acute Care Choice:  NA Choice offered to:  NA  DME Arranged:    DME Agency:     HH Arranged:    HH Agency:     Status of Service:  Completed, signed off  Medicare Important Message Given:    Date Medicare IM Given:    Medicare IM give by:    Date Additional Medicare IM Given:    Additional Medicare Important Message give by:     If discussed at [place] of Stay Meetings, dates discussed:    Additional Comments:  Sherald Barge, RN 05/03/2015, 12:41 PM

## 2015-05-03 NOTE — Progress Notes (Signed)
Patient states  understanding of discharge instructions,  Prescription given

## 2015-05-04 LAB — URINE CULTURE: Culture: >=100000

## 2015-05-04 LAB — HEMOGLOBIN A1C
Hgb A1c MFr Bld: 7.1 % — ABNORMAL HIGH (ref 4.8–5.6)
Mean Plasma Glucose: 157 mg/dL

## 2015-05-17 DIAGNOSIS — H353231 Exudative age-related macular degeneration, bilateral, with active choroidal neovascularization: Secondary | ICD-10-CM | POA: Diagnosis not present

## 2015-05-17 DIAGNOSIS — H43813 Vitreous degeneration, bilateral: Secondary | ICD-10-CM | POA: Diagnosis not present

## 2015-05-17 DIAGNOSIS — S9031XA Contusion of right foot, initial encounter: Secondary | ICD-10-CM | POA: Diagnosis not present

## 2015-05-17 DIAGNOSIS — S90221A Contusion of right lesser toe(s) with damage to nail, initial encounter: Secondary | ICD-10-CM | POA: Diagnosis not present

## 2015-05-30 DIAGNOSIS — H353231 Exudative age-related macular degeneration, bilateral, with active choroidal neovascularization: Secondary | ICD-10-CM | POA: Diagnosis not present

## 2015-06-06 DIAGNOSIS — I4891 Unspecified atrial fibrillation: Secondary | ICD-10-CM | POA: Diagnosis not present

## 2015-06-06 LAB — PROTIME-INR

## 2015-06-11 DIAGNOSIS — I4891 Unspecified atrial fibrillation: Secondary | ICD-10-CM | POA: Diagnosis not present

## 2015-06-11 LAB — PROTIME-INR: Protime: 12.7 seconds (ref 10.0–13.8)

## 2015-06-11 LAB — POCT INR: INR: 1.2 — AB (ref 0.9–1.1)

## 2015-06-15 DIAGNOSIS — I340 Nonrheumatic mitral (valve) insufficiency: Secondary | ICD-10-CM | POA: Diagnosis not present

## 2015-06-15 DIAGNOSIS — I480 Paroxysmal atrial fibrillation: Secondary | ICD-10-CM | POA: Diagnosis not present

## 2015-06-15 DIAGNOSIS — Z952 Presence of prosthetic heart valve: Secondary | ICD-10-CM | POA: Diagnosis not present

## 2015-06-15 DIAGNOSIS — I442 Atrioventricular block, complete: Secondary | ICD-10-CM | POA: Diagnosis not present

## 2015-06-20 DIAGNOSIS — E1165 Type 2 diabetes mellitus with hyperglycemia: Secondary | ICD-10-CM | POA: Diagnosis not present

## 2015-06-20 DIAGNOSIS — I4891 Unspecified atrial fibrillation: Secondary | ICD-10-CM | POA: Diagnosis not present

## 2015-06-20 DIAGNOSIS — I509 Heart failure, unspecified: Secondary | ICD-10-CM | POA: Diagnosis not present

## 2015-06-20 DIAGNOSIS — E785 Hyperlipidemia, unspecified: Secondary | ICD-10-CM | POA: Diagnosis not present

## 2015-06-20 LAB — BASIC METABOLIC PANEL
BUN: 14 mg/dL (ref 4–21)
Creatinine: 0.8 mg/dL (ref 0.5–1.1)
GLUCOSE: 89 mg/dL
Potassium: 3.4 mmol/L (ref 3.4–5.3)
Sodium: 142 mmol/L (ref 137–147)

## 2015-06-20 LAB — LIPID PANEL
CHOLESTEROL: 149 mg/dL (ref 0–200)
HDL: 52 mg/dL (ref 35–70)
LDL CALC: 69 mg/dL
Triglycerides: 141 mg/dL (ref 40–160)

## 2015-06-20 LAB — CBC AND DIFFERENTIAL
HEMATOCRIT: 39 % (ref 36–46)
HEMOGLOBIN: 12.7 g/dL (ref 12.0–16.0)
Platelets: 353 10*3/uL (ref 150–399)
WBC: 6.9 10*3/mL

## 2015-06-20 LAB — PROTIME-INR: PROTIME: 12.6 s (ref 10.0–13.8)

## 2015-06-20 LAB — POCT INR: INR: 1.2 — AB (ref 0.9–1.1)

## 2015-06-20 LAB — HEPATIC FUNCTION PANEL
ALK PHOS: 106 U/L (ref 25–125)
ALT: 14 U/L (ref 7–35)
AST: 14 U/L (ref 13–35)
Bilirubin, Total: 0.3 mg/dL

## 2015-06-20 LAB — MICROALBUMIN, URINE: Microalb, Ur: 69.2

## 2015-06-20 LAB — HEMOGLOBIN A1C: Hemoglobin A1C: 6.7

## 2015-06-28 DIAGNOSIS — I25810 Atherosclerosis of coronary artery bypass graft(s) without angina pectoris: Secondary | ICD-10-CM | POA: Diagnosis not present

## 2015-06-28 DIAGNOSIS — M797 Fibromyalgia: Secondary | ICD-10-CM | POA: Diagnosis not present

## 2015-06-28 DIAGNOSIS — I509 Heart failure, unspecified: Secondary | ICD-10-CM | POA: Diagnosis not present

## 2015-06-28 DIAGNOSIS — I068 Other rheumatic aortic valve diseases: Secondary | ICD-10-CM | POA: Diagnosis not present

## 2015-06-28 DIAGNOSIS — E1165 Type 2 diabetes mellitus with hyperglycemia: Secondary | ICD-10-CM | POA: Diagnosis not present

## 2015-06-28 DIAGNOSIS — I481 Persistent atrial fibrillation: Secondary | ICD-10-CM | POA: Diagnosis not present

## 2015-06-28 DIAGNOSIS — I10 Essential (primary) hypertension: Secondary | ICD-10-CM | POA: Diagnosis not present

## 2015-06-28 DIAGNOSIS — E784 Other hyperlipidemia: Secondary | ICD-10-CM | POA: Diagnosis not present

## 2015-07-19 DIAGNOSIS — H353231 Exudative age-related macular degeneration, bilateral, with active choroidal neovascularization: Secondary | ICD-10-CM | POA: Diagnosis not present

## 2015-07-19 DIAGNOSIS — H43813 Vitreous degeneration, bilateral: Secondary | ICD-10-CM | POA: Diagnosis not present

## 2015-07-25 DIAGNOSIS — I4891 Unspecified atrial fibrillation: Secondary | ICD-10-CM | POA: Diagnosis not present

## 2015-07-26 ENCOUNTER — Emergency Department (HOSPITAL_COMMUNITY): Payer: Medicare Other

## 2015-07-26 ENCOUNTER — Encounter (HOSPITAL_COMMUNITY): Payer: Self-pay | Admitting: Emergency Medicine

## 2015-07-26 ENCOUNTER — Emergency Department (HOSPITAL_COMMUNITY)
Admission: EM | Admit: 2015-07-26 | Discharge: 2015-07-26 | Disposition: A | Discharge status: Home / Self Care | Payer: Medicare Other | Attending: Emergency Medicine | Admitting: Emergency Medicine

## 2015-07-26 DIAGNOSIS — I110 Hypertensive heart disease with heart failure: Secondary | ICD-10-CM | POA: Insufficient documentation

## 2015-07-26 DIAGNOSIS — M79651 Pain in right thigh: Secondary | ICD-10-CM | POA: Diagnosis not present

## 2015-07-26 DIAGNOSIS — E119 Type 2 diabetes mellitus without complications: Secondary | ICD-10-CM | POA: Insufficient documentation

## 2015-07-26 DIAGNOSIS — S8001XS Contusion of right knee, sequela: Secondary | ICD-10-CM | POA: Diagnosis not present

## 2015-07-26 DIAGNOSIS — Z7984 Long term (current) use of oral hypoglycemic drugs: Secondary | ICD-10-CM | POA: Insufficient documentation

## 2015-07-26 DIAGNOSIS — M5432 Sciatica, left side: Secondary | ICD-10-CM | POA: Diagnosis not present

## 2015-07-26 DIAGNOSIS — Z87891 Personal history of nicotine dependence: Secondary | ICD-10-CM | POA: Insufficient documentation

## 2015-07-26 DIAGNOSIS — I2510 Atherosclerotic heart disease of native coronary artery without angina pectoris: Secondary | ICD-10-CM | POA: Insufficient documentation

## 2015-07-26 DIAGNOSIS — Z7982 Long term (current) use of aspirin: Secondary | ICD-10-CM | POA: Diagnosis not present

## 2015-07-26 DIAGNOSIS — Z79899 Other long term (current) drug therapy: Secondary | ICD-10-CM | POA: Insufficient documentation

## 2015-07-26 DIAGNOSIS — I509 Heart failure, unspecified: Secondary | ICD-10-CM | POA: Insufficient documentation

## 2015-07-26 DIAGNOSIS — M25561 Pain in right knee: Secondary | ICD-10-CM | POA: Diagnosis present

## 2015-07-26 DIAGNOSIS — W109XXS Fall (on) (from) unspecified stairs and steps, sequela: Secondary | ICD-10-CM | POA: Diagnosis not present

## 2015-07-26 DIAGNOSIS — S8991XA Unspecified injury of right lower leg, initial encounter: Secondary | ICD-10-CM | POA: Diagnosis not present

## 2015-07-26 LAB — CBG MONITORING, ED: GLUCOSE-CAPILLARY: 73 mg/dL (ref 65–99)

## 2015-07-26 MED ORDER — PREDNISONE 50 MG PO TABS
60.0000 mg | ORAL_TABLET | Freq: Once | ORAL | Status: AC
  Administered 2015-07-26: 60 mg via ORAL
  Filled 2015-07-26: qty 1

## 2015-07-26 MED ORDER — PREDNISONE 20 MG PO TABS: 40.0000 mg | ORAL_TABLET | Freq: Two times a day (BID) | ORAL | Status: DC

## 2015-07-26 MED ORDER — TRAMADOL HCL 50 MG PO TABS: 50.0000 mg | ORAL_TABLET | Freq: Four times a day (QID) | ORAL | Status: DC | PRN

## 2015-07-26 MED ORDER — TRAMADOL HCL 50 MG PO TABS
50.0000 mg | ORAL_TABLET | Freq: Once | ORAL | Status: AC
Start: 2015-07-26 — End: 2015-07-26
  Administered 2015-07-26: 50 mg via ORAL
  Filled 2015-07-26: qty 1

## 2015-07-26 MED ORDER — CYCLOBENZAPRINE HCL 5 MG PO TABS: 5.0000 mg | ORAL_TABLET | Freq: Three times a day (TID) | ORAL | Status: DC | PRN

## 2015-07-26 NOTE — ED Notes (Signed)
Pt c/o lower back pain that radiates down left leg and right knee pain.

## 2015-07-26 NOTE — ED Notes (Signed)
Pt alert & oriented x4, stable gait. Patient given discharge instructions, paperwork & prescription(s). Patient verbalized understanding. Pt left department in wheelchair escorted by staff. Pt left department w/ no further questions. 

## 2015-07-26 NOTE — Discharge Instructions (Signed)
Continue using ice and heat to the bruised and painful areas. Take the medications as prescribed. Monitor your blood sugar closely while taking the prednisone. Follow up with Dr Aline Brochure, the orthopedist on call about your knee and your sciatica. Don't be surprised if the bruising around your knee ends up at the bottom of your foot.     Sciatica Sciatica is pain, weakness, numbness, or tingling along your sciatic nerve. The nerve starts in the lower back and runs down the back of each leg. Nerve damage or certain conditions pinch or put pressure on the sciatic nerve. This causes the pain, weakness, and other discomforts of sciatica. HOME CARE   Only take medicine as told by your doctor.  Apply ice to the affected area for 20 minutes. Do this 3-4 times a day for the first 48-72 hours. Then try heat in the same way.  Exercise, stretch, or do your usual activities if these do not make your pain worse.  Go to physical therapy as told by your doctor.  Keep all doctor visits as told.  Do not wear high heels or shoes that are not supportive.  Get a firm mattress if your mattress is too soft to lessen pain and discomfort. GET HELP RIGHT AWAY IF:   You cannot control when you poop (bowel movement) or pee (urinate).  You have more weakness in your lower back, lower belly (pelvis), butt (buttocks), or legs.  You have redness or puffiness (swelling) of your back.  You have a burning feeling when you pee.  You have pain that gets worse when you lie down.  You have pain that wakes you from your sleep.  Your pain is worse than past pain.  Your pain lasts longer than 4 weeks.  You are suddenly losing weight without reason. MAKE SURE YOU:   Understand these instructions.  Will watch this condition.  Will get help right away if you are not doing well or get worse.   This information is not intended to replace advice given to you by your health care provider. Make sure you discuss any  questions you have with your health care provider.   Document Released: 12/20/2007 Document Revised: 12/01/2014 Document Reviewed: 07/22/2011 Elsevier Interactive Patient Education [employer].

## 2015-07-26 NOTE — ED Provider Notes (Signed)
CSN: [id number]     Arrival date & time 07/26/15  0048 History   First MD Initiated Contact with Patient 07/26/15 0235   Chief Complaint  Patient presents with  . Back Pain     (Consider location/radiation/quality/duration/timing/severity/associated sxs/prior Treatment) HPI 9 days ago she was walking up some stairs and she tripped and fell and she hit her right knee. She has had a knee replacement in that knee. She states she hit the inner aspect of her knee. She reports swelling and bruising in the knee since. She has started using a cane just to help her walk since she fell. She also reports she has had pain in her left buttock that radiates into her left posterior thigh for the past 3 weeks. She does not associate that with any type of injury. She denies any back pain.  Patient has been on Coumadin since she had a stroke in February. She states since her stroke she has balance problems and she lost some taste sensation. She is scheduled to start physical therapy soon as she gets completely moved to the area.  PCP Dr Manuella Ghazi  Past Medical History  Diagnosis Date  . Hypertension   . Hypercholesteremia   . Diabetes mellitus without complication ([place])   . Fibromyalgia   . Macular degeneration   . CHF (congestive heart failure) ([place])   . Coronary artery disease    Past Surgical History  Procedure Laterality Date  . Cholecystectomy    . Cardiac surgery    . Fracture surgery    . Joint replacement     Family History  Problem Relation Age of Onset  . Stroke Mother   . Heart failure Mother   . Diabetes Father   . Heart attack Father   . Stroke Father    Social History  Substance Use Topics  . Smoking status: Former Research scientist (life sciences)  . Smokeless tobacco: Never Used  . Alcohol Use: No   Patient is in the process of moving here to stay with her daughter from [place] [facility]   3 3 3       2      Review of Systems   All other systems reviewed and are negative.     Allergies  Penicillins; Morphine and related; and Tape  Home Medications   Prior to Admission medications   Medication Sig Start Date End Date Taking? Authorizing Provider  aspirin EC 81 MG tablet Take 81 mg by mouth daily.   Yes Historical Provider, MD  furosemide (LASIX) 20 MG tablet Take 20 mg by mouth daily.   Yes Historical Provider, MD  metFORMIN (GLUCOPHAGE) 500 MG tablet Take 500 mg by mouth 2 (two) times daily with a meal.   Yes Historical Provider, MD  metoprolol tartrate (LOPRESSOR) 25 MG tablet Take 12.5 mg by mouth 2 (two) times daily.   Yes Historical Provider, MD  Multiple Vitamins-Minerals (PRESERVISION AREDS 2 PO) Take 1 capsule by mouth 2 (two) times daily.   Yes Historical Provider, MD  potassium chloride (K-DUR) 10 MEQ tablet Take 10 mEq by mouth daily.   Yes Historical Provider, MD  UNABLE TO FIND Med Name: eyelea inject in both eye every 2 month   Yes Historical Provider, MD  valsartan (DIOVAN) 160 MG tablet Take 1 tablet (160 mg total) by mouth daily. 05/03/15  Yes Donne Hazel, MD  butalbital-acetaminophen-caffeine (FIORICET, ESGIC) [phone] MG tablet Take 1  tablet by mouth 2 (two) times daily as needed for headache.    Historical Provider, MD  cyclobenzaprine (FLEXERIL) 5 MG tablet Take 1 tablet (5 mg total) by mouth 3 (three) times daily as needed (muscle soreness). 07/26/15   Rolland Porter, MD  predniSONE (DELTASONE) 20 MG tablet Take 2 tablets (40 mg total) by mouth 2 (two) times daily with a meal. 07/26/15   Rolland Porter, MD  traMADol (ULTRAM) 50 MG tablet Take 1 tablet (50 mg total) by mouth every 6 (six) hours as needed. 07/26/15   Rolland Porter, MD  warfarin (COUMADIN) 10 MG tablet Take 2 mg by mouth every evening. *Takes 10mg  and 2mg  tablets to complete dose of 12mg *    Historical Provider, MD  warfarin (COUMADIN) 2 MG tablet Take 10 mg by mouth every evening. *Takes 10mg  and 2mg  tablets to complete dose of 12mg *     Historical Provider, MD   BP 165/83 mmHg  Pulse 52  Temp(Src) 98.1 F (36.7 C)  Resp 20  Ht 5\' 2"  (1.575 m)  Wt 145 lb (65.772 kg)  BMI 26.51 kg/m2  SpO2 98%  Vital signs normal except bradycardia  Physical Exam  Constitutional: She is oriented to person, place, and time. She appears well-developed and well-nourished.  Non-toxic appearance. She does not appear ill. No distress.  HENT:  Head: Normocephalic and atraumatic.  Right Ear: External ear normal.  Left Ear: External ear normal.  Nose: Nose normal. No mucosal edema or rhinorrhea.  Mouth/Throat: Mucous membranes are normal. No dental abscesses or uvula swelling.  Eyes: Conjunctivae and EOM are normal. Pupils are equal, round, and reactive to light.  Neck: Normal range of motion and full passive range of motion without pain. Neck supple.  Pulmonary/Chest: Effort normal. No respiratory distress. She has no rhonchi. She exhibits no crepitus.  Abdominal: Normal appearance.  Musculoskeletal: Normal range of motion. She exhibits edema and tenderness.  Moves all extremities well. Patient has mild discomfort to palpation in her sacral area midline. She does not have tenderness over her SI joints. She is very tender to palpation in her left sciatic notch. This reproduces her complaints of her buttock pain. She indicates the pain goes down the back of her thigh down to her knee. Patient has a surgical scar consistent with total knee replacement on her right knee. She's noted to have some bruising medially and has some prominent swelling over the tibial prominence that is very soft. She is also tender to palpation over the medial joint space. Her calf is nontender.  Neurological: She is alert and oriented to person, place, and time. She has normal strength. No cranial nerve deficit.  Skin: Skin is warm, dry and intact. No rash noted. No erythema. No pallor.  Psychiatric: She has a normal mood and affect. Her speech is normal and behavior is  normal. Her mood appears not anxious.  Nursing note and vitals reviewed.   ED Course  Procedures (including critical care time)  Medications  traMADol (ULTRAM) tablet 50 mg (not administered)  predniSONE (DELTASONE) tablet 60 mg (not administered)   We discussed getting x-rays of her knee. We discussed her buttock pain is consistent with sciatica. Patient is a diabetic on oral agents. She will be started on steroids however she's advised to closely monitor her blood sugar to make sure she doesn't become hyperglycemic. Is also on Coumadin because she had a stroke in February. She will be placed on a low dose steroids because of that and increased  risk of GI bleeding. She will be referred to a local orthopedist.  I reviewed her knee x-rays with her and the swelling seen near the tibial prominence is very visibly seen. However it appears to be all soft tissue and not connected to the joint.  Labs Review Results for orders placed or performed during the hospital encounter of 07/26/15  CBG monitoring, ED  Result Value Ref Range   Glucose-Capillary 73 65 - 99 mg/dL   Comment 1 Document in Chart     Imaging Review Dg Tibia/fibula Right  07/26/2015  CLINICAL DATA:  [place] a few days ago. EXAM: RIGHT TIBIA AND FIBULA - 2 VIEW COMPARISON:  None. FINDINGS: Negative for acute fracture, dislocation or radiopaque foreign body. IMPRESSION: Negative. Electronically Signed   By: Andreas Newport M.D.   On: 07/26/2015 03:33   Dg Knee Complete 4 Views Right  07/26/2015  CLINICAL DATA:  [place] a few days ago. EXAM: RIGHT KNEE - COMPLETE 4+ VIEW COMPARISON:  None. FINDINGS: There are intact appearances of the right total knee arthroplasty. No fracture or dislocation is evident. No acute soft tissue abnormality is evident. IMPRESSION: Negative. Electronically Signed   By: Andreas Newport M.D.   On: 07/26/2015 03:33   I have personally reviewed and evaluated these images and lab results as part of my medical  decision-making.    MDM   Final diagnoses:  Contusion, knee, right, sequela  Sciatica, left    New Prescriptions   CYCLOBENZAPRINE (FLEXERIL) 5 MG TABLET    Take 1 tablet (5 mg total) by mouth 3 (three) times daily as needed (muscle soreness).   PREDNISONE (DELTASONE) 20 MG TABLET    Take 1 tablets (20 mg total) by mouth 2 (two) times daily with a meal.   TRAMADOL (ULTRAM) 50 MG TABLET    Take 1 tablet (50 mg total) by mouth every 6 (six) hours as needed.    Plan discharge  Rolland Porter, MD, Barbette Or, MD 07/26/15 [phone]

## 2015-07-27 DIAGNOSIS — M9904 Segmental and somatic dysfunction of sacral region: Secondary | ICD-10-CM | POA: Diagnosis not present

## 2015-07-27 DIAGNOSIS — M9903 Segmental and somatic dysfunction of lumbar region: Secondary | ICD-10-CM | POA: Diagnosis not present

## 2015-07-27 DIAGNOSIS — I4891 Unspecified atrial fibrillation: Secondary | ICD-10-CM | POA: Diagnosis not present

## 2015-07-27 DIAGNOSIS — M5430 Sciatica, unspecified side: Secondary | ICD-10-CM | POA: Diagnosis not present

## 2015-07-27 DIAGNOSIS — M9902 Segmental and somatic dysfunction of thoracic region: Secondary | ICD-10-CM | POA: Diagnosis not present

## 2015-07-27 LAB — PROTIME-INR: Protime: 32.1 seconds — AB (ref 10.0–13.8)

## 2015-07-27 LAB — POCT INR: INR: 3.1 — AB (ref 0.9–1.1)

## 2015-08-02 DIAGNOSIS — I25810 Atherosclerosis of coronary artery bypass graft(s) without angina pectoris: Secondary | ICD-10-CM | POA: Diagnosis not present

## 2015-08-02 DIAGNOSIS — I481 Persistent atrial fibrillation: Secondary | ICD-10-CM | POA: Diagnosis not present

## 2015-08-02 DIAGNOSIS — I061 Rheumatic aortic insufficiency: Secondary | ICD-10-CM | POA: Diagnosis not present

## 2015-08-02 DIAGNOSIS — I509 Heart failure, unspecified: Secondary | ICD-10-CM | POA: Diagnosis not present

## 2015-08-02 DIAGNOSIS — E1165 Type 2 diabetes mellitus with hyperglycemia: Secondary | ICD-10-CM | POA: Diagnosis not present

## 2015-08-02 DIAGNOSIS — I10 Essential (primary) hypertension: Secondary | ICD-10-CM | POA: Diagnosis not present

## 2015-08-02 DIAGNOSIS — M797 Fibromyalgia: Secondary | ICD-10-CM | POA: Diagnosis not present

## 2015-08-02 DIAGNOSIS — M5432 Sciatica, left side: Secondary | ICD-10-CM | POA: Diagnosis not present

## 2015-08-02 DIAGNOSIS — E784 Other hyperlipidemia: Secondary | ICD-10-CM | POA: Diagnosis not present

## 2015-08-05 DIAGNOSIS — I4891 Unspecified atrial fibrillation: Secondary | ICD-10-CM | POA: Diagnosis not present

## 2015-08-05 LAB — POCT INR: INR: 2.9 — AB (ref 0.9–1.1)

## 2015-08-05 LAB — PROTIME-INR: Protime: 30 seconds — AB (ref 10.0–13.8)

## 2015-08-17 DIAGNOSIS — M5430 Sciatica, unspecified side: Secondary | ICD-10-CM | POA: Diagnosis not present

## 2015-08-17 DIAGNOSIS — M9903 Segmental and somatic dysfunction of lumbar region: Secondary | ICD-10-CM | POA: Diagnosis not present

## 2015-08-17 DIAGNOSIS — M9902 Segmental and somatic dysfunction of thoracic region: Secondary | ICD-10-CM | POA: Diagnosis not present

## 2015-08-17 DIAGNOSIS — M9904 Segmental and somatic dysfunction of sacral region: Secondary | ICD-10-CM | POA: Diagnosis not present

## 2015-08-19 ENCOUNTER — Ambulatory Visit (HOSPITAL_COMMUNITY): Payer: Medicare Other | Attending: Internal Medicine | Admitting: Physical Therapy

## 2015-08-19 ENCOUNTER — Encounter (HOSPITAL_COMMUNITY): Payer: Self-pay | Admitting: Physical Therapy

## 2015-08-19 DIAGNOSIS — Z9181 History of falling: Secondary | ICD-10-CM

## 2015-08-19 DIAGNOSIS — M6281 Muscle weakness (generalized): Secondary | ICD-10-CM | POA: Diagnosis not present

## 2015-08-19 DIAGNOSIS — M79605 Pain in left leg: Secondary | ICD-10-CM | POA: Insufficient documentation

## 2015-08-19 DIAGNOSIS — R29898 Other symptoms and signs involving the musculoskeletal system: Secondary | ICD-10-CM | POA: Diagnosis not present

## 2015-08-19 NOTE — Therapy (Addendum)
[facility] [address] [place], [place], [address] Phone: [phone]   Fax:  [phone]  Physical Therapy Evaluation  Patient Details  Name: Jeanette Yates MRN: [id number] Date of Birth: 12/31/1940 Referring Provider: Marylu Lund, MD  Encounter Date: 08/19/2015      PT End of Session - 08/19/15 1423    Visit Number 1   Number of Visits 9   Date for PT Re-Evaluation 09/02/15   Authorization Type Medicare part A&B   Authorization Time Period 08/19/15 to 09/16/15   PT Start Time 1308  pt filling out paperwork   PT Stop Time 1348   PT Time Calculation (min) 40 min   Equipment Utilized During Treatment Gait belt   Activity Tolerance Patient tolerated treatment well   Behavior During Therapy [facility] for tasks assessed/performed      Past Medical History  Diagnosis Date  . Hypertension   . Hypercholesteremia   . Diabetes mellitus without complication ([place])   . Fibromyalgia   . Macular degeneration   . CHF (congestive heart failure) ([place])   . Coronary artery disease     Past Surgical History  Procedure Laterality Date  . Cholecystectomy    . Cardiac surgery    . Fracture surgery    . Joint replacement      There were no vitals filed for this visit.       Subjective Assessment - 08/19/15 1314    Subjective Pt reports she has been deconditioned after her open heart surgery 2 years ago. She had a TIA back in February and has been noticing overall weakness and balance disturbances. Also notes back pain which she is currently seeing the chiropractor for management.    Pertinent History HTN, DM, fibromyalgia, CAD, CHF, B  TKA   Diagnostic tests Xray   Patient Stated Goals Improve balance and strength    Currently in Pain? No/denies            [facility] PT Assessment - 08/19/15 0001    Assessment   Medical Diagnosis TIA   Referring Provider Jeanette Lund, MD   Onset Date/Surgical Date 04/27/15   Next MD Visit none   Prior  Therapy None    Precautions   Precautions None   Restrictions   Weight Bearing Restrictions No   Balance Screen   Has the patient fallen in the past 6 months Yes   How many times? 2  walking up the stairs and fell on her Rt   Has the patient had a decrease in activity level because of a fear of falling?  No   Is the patient reluctant to leave their home because of a fear of falling?  No   Home Ecologist residence   Home Access --   Prior Function   Level of [place] Retired   Leisure Scientist, product/process development   Overall Cognitive Status Within Functional Limits for tasks assessed   Sensation   Light Touch Appears Intact   Posture/Postural Control   Posture/Postural Control No significant limitations   ROM / Strength   AROM / PROM / Strength Strength   Strength   Strength Assessment Site Hip;Knee;Ankle   Right/Left Hip Right;Left   Right Hip Flexion 4/5   Right Hip Extension 3/5   Right Hip ABduction 3+/5   Left Hip Flexion 3+/5  pain reported in posterior thigh   Left Hip Extension 3/5   Left Hip  ABduction 3+/5   Right/Left Knee Right;Left   Right Knee Flexion 5/5   Right Knee Extension 5/5   Left Knee Flexion 5/5   Left Knee Extension 5/5   Right/Left Ankle Right;Left   Right Ankle Dorsiflexion 5/5   Left Ankle Dorsiflexion 5/5   Flexibility   Soft Tissue Assessment /Muscle Length yes   Hamstrings L/R: 50/40 degrees   Quadriceps (+) BLE   ITB (-) Ober's    Piriformis 75% limited with pain, LLE   Palpation   Palpation comment TTP along L TFL/Glute med,, Lt piriformis   Special Tests    Special Tests Lumbar   Lumbar Tests Straight Leg Raise   Straight Leg Raise   Findings Negative   Transfers   Five time sit to stand comments  15 sec without UE   Ambulation/Gait   Ambulation/Gait Yes   Stairs Yes   Stairs Assistance Other (comment);5: Supervision  ascend/descend 6" steps with B handrails and reciprocal     Gait Comments increased hip adduction/valgus noted during gait cycle   Balance   Balance Assessed Yes   Dynamic Standing Balance   Dynamic Standing - Comments tandem: up to 20 sec each; SLS up to 15 sec each; alternating step taps without UE/ no LOB   Standardized Balance Assessment   Standardized Balance Assessment Timed Up and Go Test   Timed Up and Go Test   TUG Comments 16.5 with UE to get in/out of chair                   [facility] Adult PT Treatment/Exercise - 08/19/15 0001    Exercises   Exercises Knee/Hip   Knee/Hip Exercises: Stretches   Other Knee/Hip Stretches supine piriformis stretch x30 sec Lt   Knee/Hip Exercises: Supine   Bridges Both;1 set;10 reps                PT Education - 08/19/15 1421    Education provided Yes   Education Details Discussed eval findings/POC; initiated HEP; discussed focus on general strengthening and balance initially, then transitioning to her back once she receives a new order and chiropractor care is complete.   Person(s) Educated Patient;Child(ren)   Methods Explanation;Demonstration;Handout   Comprehension Verbalized understanding;Returned demonstration          PT Short Term Goals - 08/19/15 1830    PT SHORT TERM GOAL #1   Title Pt will demo consistency and independence with HEP   Time 1   Period Weeks   Status New           PT Long Term Goals - 08/19/15 1831    PT LONG TERM GOAL #1   Title Pt will demo improved BLE strength to atleast 4/5 to improve ambulation and safety with stair negotiation.   Time 4   Period Weeks   Status New   PT LONG TERM GOAL #2   Title Pt will demo improved functional strength evident by 5x sit to stand without UE assist and in less than 13 sec.    Time 4   Period Weeks   Status New   PT LONG TERM GOAL #3   Title Pt will demo improved TUG time of no greater than 14 sec to indicated she is at a decreased risk of falls   Time 4   Period Weeks   Status New   PT LONG TERM  GOAL #4   Title Pt will demo improved hip flexibility evident by negative Ely's test  to improve pelvic alignment with activity.   Time 4   Period Weeks   Status New               Plan - 08/19/15 1424    Clinical Impression Statement Jeanette Yates is a [id number] F referred to OPPT s/p TIA back in march and since then she has had 2 falls and noticed decreased balance and strength impacting her safety at home and in the community. She now presents with BLE weakness (specifically in the hips) and limited functional balance/strength evident by her increased time to complete 5x sit to stand and TUG testing. Her scores place her at an increased risk of falling and injury. Pt also reporting chronic back pain and radiating pain into her LLE which she would potentially want to address at a later time, however she would like to focus on strength and balance at this time. She would benefit from skilled PT services to address her listed limitations and increase her safety with daily activity.    Rehab Potential Good   PT Frequency 2x / week   PT Duration 4 weeks   PT Treatment/Interventions Electrical Stimulation;Therapeutic exercise;Therapeutic activities;Stair training;Gait training;Balance training;Neuromuscular re-education;Patient/family education;Manual techniques;Taping;Passive range of motion   PT Next Visit Plan log roll technique; hip extensor/abductor strength; furhter assess balance; hamstring/quad/piriformis flexibility   PT Home Exercise Plan bridge, piriformis stretch   Recommended Other Services Pt interested in cardiac rehab   Consulted and Agree with Plan of Care Patient      Patient will benefit from skilled therapeutic intervention in order to improve the following deficits and impairments:  Decreased activity tolerance, Decreased balance, Decreased strength, Improper body mechanics, Impaired flexibility, Pain, Increased muscle spasms, Decreased endurance, Difficulty walking  Visit  Diagnosis: Muscle weakness (generalized)  History of falling  Pain In Left Leg  Other symptoms and signs involving the musculoskeletal system     Problem List Patient Active Problem List   Diagnosis Date Noted  . Numbness on right side 05/03/2015  . TIA (transient ischemic attack) 05/02/2015  . Hypertension 05/02/2015   6:38 PM,08/19/2015 Elly Modena PT, DPT [facility] [address] [place], [place], [address] Phone: [phone]   Fax:  [phone]  Name: Jeanette Yates MRN: [id number] Date of Birth: [birth year]   * addendum for [facility] 09/07/15: Mobility and walking around: Current 20-40% limited Goal 20-40% limited  9:12 AM,09/07/2015 Elly Modena PT, DPT [facility] [phone]

## 2015-08-19 NOTE — Patient Instructions (Signed)
BRIDGING  While lying on your back, tighten your lower abdominals, squeeze your buttocks and then raise your buttocks off the floor/bed as creating a "Bridge" with your body. Hold and then lower yourself and repeat. 2x10 reps      PIRIFORMIS STRETCH  While lying on your back with both knee bent, cross your affected leg on the other knee.   Next, hold your unaffected thigh and pull it up towards your chest until a stretch is felt in the buttock.  hold 30sec, repeat 3x

## 2015-08-23 ENCOUNTER — Ambulatory Visit (HOSPITAL_COMMUNITY): Payer: Medicare Other | Admitting: Physical Therapy

## 2015-08-23 DIAGNOSIS — Z9181 History of falling: Secondary | ICD-10-CM

## 2015-08-23 DIAGNOSIS — M79605 Pain in left leg: Secondary | ICD-10-CM | POA: Diagnosis not present

## 2015-08-23 DIAGNOSIS — R29898 Other symptoms and signs involving the musculoskeletal system: Secondary | ICD-10-CM | POA: Diagnosis not present

## 2015-08-23 DIAGNOSIS — M6281 Muscle weakness (generalized): Secondary | ICD-10-CM

## 2015-08-23 NOTE — Therapy (Signed)
[facility] [address] [place], [place], [address] Phone: [phone]   Fax:  [phone]  Physical Therapy Treatment  Patient Details  Name: Jeanette Yates MRN: [id number] Date of Birth: Jun 06, 1940 Referring Provider: Marylu Lund, MD  Encounter Date: 08/23/2015      PT End of Session - 08/23/15 1015    Visit Number 2   Number of Visits 9   Date for PT Re-Evaluation 09/02/15   Authorization Type Medicare part A&B   Authorization Time Period 08/19/15 to 09/16/15   PT Start Time 0947   PT Stop Time 1027   PT Time Calculation (min) 40 min   Equipment Utilized During Treatment Gait belt   Activity Tolerance Patient tolerated treatment well   Behavior During Therapy [facility] for tasks assessed/performed      Past Medical History  Diagnosis Date  . Hypertension   . Hypercholesteremia   . Diabetes mellitus without complication ([place])   . Fibromyalgia   . Macular degeneration   . CHF (congestive heart failure) ([place])   . Coronary artery disease     Past Surgical History  Procedure Laterality Date  . Cholecystectomy    . Cardiac surgery    . Fracture surgery    . Joint replacement      There were no vitals filed for this visit.      Subjective Assessment - 08/23/15 1003    Subjective Pt states she is having pain in her bilateral hip areas 3/10 at rest increaseing to 5/10 with exercises and actvities. Pt states she has been unble to complete the bridge actvitiy the last 2 days due to pain and discomfort.  Pt has been doing the seated piriformis stretch without difficutly.    Currently in Pain? Yes   Pain Score 3    Pain Location Back   Pain Orientation Lower   Pain Descriptors / Indicators Nagging;Sharp   Aggravating Factors  sharp pains with movements such as bending forward    Pain Relieving Factors rest                         [facility] Adult PT Treatment/Exercise - 08/23/15 1006    Knee/Hip Exercises: Stretches    Active Hamstring Stretch Both;3 reps;20 seconds   Active Hamstring Stretch Limitations supine with rope   Piriformis Stretch Both;2 reps;30 seconds   Piriformis Stretch Limitations seated   Knee/Hip Exercises: Seated   Sit to Sand 10 reps;without UE support   Knee/Hip Exercises: Supine   Bridges 5 reps   Straight Leg Raises Both;10 reps   Knee/Hip Exercises: Sidelying   Hip ABduction Both;10 reps   Knee/Hip Exercises: Prone   Hamstring Curl 10 reps   Hip Extension Both;10 reps                PT Education - 08/23/15 1013    Education provided Yes   Education Details given a copy of initial evaluation with explanation of goals.  Updated HEP adding SLR, hip abd/extension, knee flexion and hamstring curls.   Person(s) Educated Patient   Methods Explanation;Demonstration;Handout;Verbal cues   Comprehension Verbalized understanding;Returned demonstration;Verbal cues required;Need further instruction          PT Short Term Goals - 08/19/15 1830    PT SHORT TERM GOAL #1   Title Pt will demo consistency and independence with HEP   Time 1   Period Weeks   Status New  PT Long Term Goals - 08/19/15 1831    PT LONG TERM GOAL #1   Title Pt will demo improved BLE strength to atleast 4/5 to improve ambulation and safety with stair negotiation.   Time 4   Period Weeks   Status New   PT LONG TERM GOAL #2   Title Pt will demo improved functional strength evident by 5x sit to stand without UE assist and in less than 13 sec.    Time 4   Period Weeks   Status New   PT LONG TERM GOAL #3   Title Pt will demo improved TUG time of no greater than 14 sec to indicated she is at a decreased risk of falls   Time 4   Period Weeks   Status New   PT LONG TERM GOAL #4   Title Pt will demo improved hip flexibility evident by negative Ely's test to improve pelvic alignment with activity.   Time 4   Period Weeks   Status New               Plan - 08/23/15 1042     Clinical Impression Statement Reviewed established HEP and given copy of initial evaluation.  Pt instructed to only do only to tolerance as she had reported increased pain from bridge exercise.  Progressed with additional therex to improve LE strength.  Updated HEP with these exercises.  Pt required verbal cues only to complete.  good speed and form with therex today.     Rehab Potential Good   PT Frequency 2x / week   PT Duration 4 weeks   PT Treatment/Interventions Electrical Stimulation;Therapeutic exercise;Therapeutic activities;Stair training;Gait training;Balance training;Neuromuscular re-education;Patient/family education;Manual techniques;Taping;Passive range of motion   PT Next Visit Plan Progress with standing therex and begin balance exercises.     PT Home Exercise Plan updated with SLR, hip abduction, hip extension, hamstring curl and hamstring stretch.    Consulted and Agree with Plan of Care Patient      Patient will benefit from skilled therapeutic intervention in order to improve the following deficits and impairments:  Decreased activity tolerance, Decreased balance, Decreased strength, Improper body mechanics, Impaired flexibility, Pain, Increased muscle spasms, Decreased endurance, Difficulty walking  Visit Diagnosis: Muscle weakness (generalized)  History of falling  Pain In Left Leg  Other symptoms and signs involving the musculoskeletal system     Problem List Patient Active Problem List   Diagnosis Date Noted  . Numbness on right side 05/03/2015  . TIA (transient ischemic attack) 05/02/2015  . Hypertension 05/02/2015    Teena Irani, PTA/CLT [phone]  08/23/2015, 12:02 PM  [facility] [address] [place], [place], [address] Phone: [phone]   Fax:  [phone]  Name: Jeanette Yates MRN: [id number] Date of Birth: 03-28-1940

## 2015-08-23 NOTE — Patient Instructions (Signed)
KNEE: Flexion - Prone    Bend knee. Raise heel toward buttocks. Do not raise hips. _10__ reps per set, _2__ sets per day   HIP / KNEE: Extension - Prone    Squeeze glutes. Raise leg up. Keep knee straight. _10__ reps per set, _2__ sets per day   Abduction: Side Leg Lift (Eccentric) - Side-Lying    Lie on side. Lift top leg slightly higher than shoulder level. Keep top leg straight with body, toes pointing forward. Slowly lower for 3-5 seconds. _10__ reps per set, _2__ sets per day   Straight Leg Raise    Tighten stomach and slowly raise locked right leg _20___ inches from floor. Repeat _10___ times per set. Do __2_ sessions per day.     Hamstring Stretch    With other leg bent, foot flat, grasp right leg and slowly try to straighten knee. Hold  30  seconds.Repeat __ 3__ times. Do __2__ sessions per day.

## 2015-08-30 ENCOUNTER — Ambulatory Visit (HOSPITAL_COMMUNITY): Payer: Medicare Other | Attending: Internal Medicine | Admitting: Physical Therapy

## 2015-08-30 DIAGNOSIS — M6281 Muscle weakness (generalized): Secondary | ICD-10-CM | POA: Diagnosis not present

## 2015-08-30 DIAGNOSIS — M79605 Pain in left leg: Secondary | ICD-10-CM | POA: Insufficient documentation

## 2015-08-30 DIAGNOSIS — R29898 Other symptoms and signs involving the musculoskeletal system: Secondary | ICD-10-CM

## 2015-08-30 DIAGNOSIS — Z9181 History of falling: Secondary | ICD-10-CM

## 2015-08-30 NOTE — Therapy (Signed)
[place] [address] [place], [place], [address] Phone: [phone]   Fax:  [phone]  Physical Therapy Treatment  Patient Details  Name: Jeanette Yates MRN: [id number] Date of Birth: 30-May-1940 Referring Provider: Marylu Lund, MD  Encounter Date: 08/30/2015      PT End of Session - 08/30/15 1020    Visit Number 3   Number of Visits 9   Date for PT Re-Evaluation 09/02/15   Authorization Type Medicare part A&B   Authorization Time Period 08/19/15 to 09/16/15   PT Start Time 0946   PT Stop Time 1030   PT Time Calculation (min) 44 min   Equipment Utilized During Treatment Gait belt   Activity Tolerance Patient tolerated treatment well   Behavior During Therapy [facility] for tasks assessed/performed      Past Medical History  Diagnosis Date  . Hypertension   . Hypercholesteremia   . Diabetes mellitus without complication ([place])   . Fibromyalgia   . Macular degeneration   . CHF (congestive heart failure) ([place])   . Coronary artery disease     Past Surgical History  Procedure Laterality Date  . Cholecystectomy    . Cardiac surgery    . Fracture surgery    . Joint replacement      There were no vitals filed for this visit.      Subjective Assessment - 08/30/15 0950    Subjective Pt states she is not in pain this morning. Overall she feels she is getting stronger. She reports the bridge was aggravating her lower back so she stopped doing it and has been doing her other exercises daily. Feels they have gotten easier.   Diagnostic tests Xray   Patient Stated Goals Improve balance and strength    Currently in Pain? No/denies            [facility] PT Assessment - 08/30/15 0001    Balance   Balance Assessed Yes   Standardized Balance Assessment   Standardized Balance Assessment Berg Balance Test   Berg Balance Test   Sit to Stand Able to stand without using hands and stabilize independently   Standing Unsupported Able to stand  safely 2 minutes   Sitting with Back Unsupported but Feet Supported on Floor or Stool Able to sit safely and securely 2 minutes   Stand to Sit Sits safely with minimal use of hands   Transfers Able to transfer safely, minor use of hands   Standing Unsupported with Eyes Closed Able to stand 10 seconds safely   Standing Ubsupported with Feet Together Able to place feet together independently and stand 1 minute safely   From Standing, Reach Forward with Outstretched Arm Can reach forward >12 cm safely (5")  8 inches   From Standing Position, Pick up Object from Floor Able to pick up shoe safely and easily   From Standing Position, Turn to Look Behind Over each Shoulder Looks behind from both sides and weight shifts well   Turn 360 Degrees Able to turn 360 degrees safely in 4 seconds or less   Standing Unsupported, Alternately Place Feet on Step/Stool Able to stand independently and safely and complete 8 steps in 20 seconds   Standing Unsupported, One Foot in Front Able to place foot tandem independently and hold 30 seconds   Standing on One Leg Able to lift leg independently and hold 5-10 seconds  L 6 sec, R 10 sec   Total Score 54  [facility] Adult PT Treatment/Exercise - 08/30/15 0001    Exercises   Exercises Other Exercises   Knee/Hip Exercises: Stretches   Quad Stretch Both;3 reps;30 seconds   Quad Stretch Limitations prone   Knee/Hip Exercises: Standing   Wall Squat 2 sets;10 reps  green TB to decrease knee valgus   Knee/Hip Exercises: Supine   Bridges 1 set;10 reps   Knee/Hip Exercises: Sidelying   Hip ABduction Both;1 set;10 reps   Hip ABduction Limitations against wall                 PT Education - 08/30/15 1019    Education provided Yes   Education Details reviewed/updated HEP. importance of correct technique for optimal benefit from therex   Person(s) Educated Patient   Methods Explanation;Demonstration;Handout;Verbal cues    Comprehension Verbalized understanding;Returned demonstration;Need further instruction          PT Short Term Goals - 08/19/15 1830    PT SHORT TERM GOAL #1   Title Pt will demo consistency and independence with HEP   Time 1   Period Weeks   Status New           PT Long Term Goals - 08/19/15 1831    PT LONG TERM GOAL #1   Title Pt will demo improved BLE strength to atleast 4/5 to improve ambulation and safety with stair negotiation.   Time 4   Period Weeks   Status New   PT LONG TERM GOAL #2   Title Pt will demo improved functional strength evident by 5x sit to stand without UE assist and in less than 13 sec.    Time 4   Period Weeks   Status New   PT LONG TERM GOAL #3   Title Pt will demo improved TUG time of no greater than 14 sec to indicated she is at a decreased risk of falls   Time 4   Period Weeks   Status New   PT LONG TERM GOAL #4   Title Pt will demo improved hip flexibility evident by negative Ely's test to improve pelvic alignment with activity.   Time 4   Period Weeks   Status New               Plan - 08/30/15 1027    Clinical Impression Statement Today's session focused on therex to address LE strength and flexibility. Pt without pain today and reporting overall improvement in hip pain since her eval. She is performing her HEP regularly and noticing the exercises are getting easier. Pt has been complaining of feeling of unsteadiness lately, however she scored a 54/56 on her Berg balance test indicating she has overall good balance. PT reviewed the results of the test and updated pt's HEP with pt demonstrating good understanding of exercises. Will continue with current POC.   Rehab Potential Good   PT Frequency 2x / week   PT Duration 4 weeks   PT Treatment/Interventions Electrical Stimulation;Therapeutic exercise;Therapeutic activities;Stair training;Gait training;Balance training;Neuromuscular re-education;Patient/family education;Manual  techniques;Taping;Passive range of motion   PT Next Visit Plan Progress with standing therex and begin balance exercises.     PT Home Exercise Plan updated with SLR, hip abduction, hip extension, hamstring curl and hamstring stretch.    Consulted and Agree with Plan of Care Patient      Patient will benefit from skilled therapeutic intervention in order to improve the following deficits and impairments:  Decreased activity tolerance, Decreased balance, Decreased strength, Improper body mechanics, Impaired flexibility,  Pain, Increased muscle spasms, Decreased endurance, Difficulty walking  Visit Diagnosis: Muscle weakness (generalized)  History of falling  Pain In Left Leg  Other symptoms and signs involving the musculoskeletal system     Problem List Patient Active Problem List   Diagnosis Date Noted  . Numbness on right side 05/03/2015  . TIA (transient ischemic attack) 05/02/2015  . Hypertension 05/02/2015    12:55 PM,08/30/2015 Elly Modena PT, DPT [facility] [address] [place], [place], [address] Phone: [phone]   Fax:  [phone]  Name: Alaena Caul MRN: [id number] Date of Birth: 1941-02-15

## 2015-09-01 ENCOUNTER — Ambulatory Visit (HOSPITAL_COMMUNITY): Payer: Medicare Other | Admitting: Physical Therapy

## 2015-09-01 DIAGNOSIS — M79605 Pain in left leg: Secondary | ICD-10-CM

## 2015-09-01 DIAGNOSIS — M6281 Muscle weakness (generalized): Secondary | ICD-10-CM

## 2015-09-01 DIAGNOSIS — Z9181 History of falling: Secondary | ICD-10-CM

## 2015-09-01 DIAGNOSIS — R29898 Other symptoms and signs involving the musculoskeletal system: Secondary | ICD-10-CM

## 2015-09-01 NOTE — Therapy (Signed)
[place], [place], [address] Phone: [phone]   Fax:  [phone]  Physical Therapy Treatment  Patient Details  Name: Jeanette Yates MRN: [id number] Date of Birth: [birth year] Referring Provider: Marylu Lund, MD  Encounter Date: 09/01/2015      PT End of Session - 09/01/15 1225    Visit Number 4   Number of Visits 9   Date for PT Re-Evaluation 09/16/15   Authorization Type Medicare part A&B   Authorization Time Period 08/19/15 to 09/16/15   PT Start Time 0903   PT Stop Time 0951   PT Time Calculation (min) 48 min   Activity Tolerance Patient tolerated treatment well   Behavior During Therapy [facility] for tasks assessed/performed      Past Medical History  Diagnosis Date  . Hypertension   . Hypercholesteremia   . Diabetes mellitus without complication ([place])   . Fibromyalgia   . Macular degeneration   . CHF (congestive heart failure) ([place])   . Coronary artery disease     Past Surgical History  Procedure Laterality Date  . Cholecystectomy    . Cardiac surgery    . Fracture surgery    . Joint replacement      There were no vitals filed for this visit.      Subjective Assessment - 09/01/15 0906    Subjective Pt states she has been doing good. She feels she has improved ~75-80% since beginning therapy. She occasionally notes pain with jarring motions or stepping off the curb the wrong way but no huge issues.   Patient is accompained by: Family member   Pertinent History HTN, DM, fibromyalgia, CAD, CHF, B  TKA   Diagnostic tests Xray   Patient Stated Goals Improve balance and strength    Currently in Pain? No/denies            [facility] PT Assessment - 09/01/15 0001    Assessment   Medical Diagnosis TIA   Referring Provider Marylu Lund, MD   Onset Date/Surgical Date 04/27/15   Next MD Visit none   Prior Therapy None    Precautions   Precautions None   Restrictions   Weight Bearing Restrictions  No   Balance Screen   Has the patient fallen in the past 6 months No   Has the patient had a decrease in activity level because of a fear of falling?  No   Is the patient reluctant to leave their home because of a fear of falling?  No   Home Ecologist residence   Prior Function   Level of Independence Independent   Vocation Retired   Leisure Scientist, product/process development   Overall Cognitive Status Within Functional Limits for tasks assessed   Engineer, production Intact   Posture/Postural Control   Posture/Postural Control No significant limitations   Strength   Right Hip Flexion 4+/5   Right Hip Extension 4/5   Right Hip ABduction 5/5   Left Hip Flexion 4+/5   Left Hip Extension 4/5   Left Hip ABduction 5/5   Right Knee Flexion 5/5   Right Knee Extension 5/5   Left Knee Flexion 5/5   Left Knee Extension 5/5   Right Ankle Dorsiflexion 5/5   Left Ankle Dorsiflexion 5/5   Flexibility   Soft Tissue Assessment /Muscle Length yes   Hamstrings L/R: 40/40 degrees   Quadriceps (+) BLE   ITB (-) Ober's  Piriformis 25% limited    Palpation   Palpation comment TTP along L TFL/Glute med,, Lt piriformis   Special Tests    Special Tests Lumbar   Lumbar Tests Straight Leg Raise   Straight Leg Raise   Findings Negative   Transfers   Five time sit to stand comments  15 sec without UE   Ambulation/Gait   Ambulation/Gait Yes   Stairs Yes   Stairs Assistance Other (comment);5: Supervision  ascend/descend 6" steps with B handrails and reciprocal    Gait Comments increased hip adduction/valgus noted during gait cycle   Balance   Balance Assessed Yes   Standardized Balance Assessment   Standardized Balance Assessment Timed Up and Go Test   Timed Up and Go Test   TUG Comments 16.5 with UE to get in/out of chair                     [facility] Adult PT Treatment/Exercise - 09/01/15 0001    Knee/Hip Exercises: Stretches   Active Hamstring  Stretch Both;1 rep;30 seconds   Active Hamstring Stretch Limitations standing    Knee/Hip Exercises: Standing   Functional Squat 1 set;5 reps   Knee/Hip Exercises: Supine   Other Supine Knee/Hip Exercises ab set 10x5sec hold   Other Supine Knee/Hip Exercises ab set with single knee to chest 10x each   Manual Therapy   Manual Therapy Soft tissue mobilization   Manual therapy comments performed separately from all other interventions    Soft tissue mobilization TFL/glute med/piriformis                 PT Education - 09/01/15 1224    Education provided Yes   Education Details reviewed/updated HEP; discussed POC/goals   Person(s) Educated Patient   Methods Explanation;Demonstration;Handout   Comprehension Verbalized understanding;Returned demonstration          PT Short Term Goals - 09/01/15 0925    PT SHORT TERM GOAL #1   Title Pt will demo consistency and independence with HEP   Time 1   Period Weeks   Status Achieved           PT Long Term Goals - 09/01/15 3875    PT LONG TERM GOAL #1   Title Pt will demo improved BLE strength to atleast 4/5 to improve ambulation and safety with stair negotiation.   Time 4   Period Weeks   Status Achieved   PT LONG TERM GOAL #2   Title Pt will demo improved functional strength evident by 5x sit to stand without UE assist and in less than 13 sec.    Baseline 11.6 sec no UE   Time 4   Period Weeks   Status Achieved   PT LONG TERM GOAL #3   Title Pt will demo improved TUG time of no greater than 14 sec to indicated she is at a decreased risk of falls   Baseline 11.4 sec no AD   Time 4   Period Weeks   Status Achieved   PT LONG TERM GOAL #4   Title Pt will demo improved hip flexibility evident by negative Ely's test to improve pelvic alignment with activity.   Time 4   Period Weeks   Status Partially Met               Plan - 09/01/15 1226    Clinical Impression Statement Pt was reassessed this visit having  made great progress towards her goals. She feels she has  made ~75% improvement overall since beginning therapy and is consistently performing her HEP. She occasionally has hip pain when stepping the wrong way, but otherwise she feels it has improved. She presents with improvements in BLE strength, functional strength/balance and her Merrilee Jansky assessed last visit was 54/56 indicating she has good balance as well. She does continue to present with limitations in LE flexibility and trigger points throughout her glute/TFL region as well as requiring increased verbal cues for deep abdominal activation. At this time, she would benefit from continued PT for a couple more sessions to address core strength and stability and improve overall tolerance to activity/decrease pain.    Rehab Potential Good   PT Frequency 2x / week   PT Duration 4 weeks   PT Treatment/Interventions Electrical Stimulation;Therapeutic exercise;Therapeutic activities;Stair training;Gait training;Balance training;Neuromuscular re-education;Patient/family education;Manual techniques;Taping;Passive range of motion   PT Next Visit Plan functional strengthening, hip flexibility (hamstring, piriformis, quad); ab set with bent knee raise, bent knee fall out   PT Home Exercise Plan updated with ab set, ab set with heel tap, bridge, sidelying hip abduction, prone quad stretch, hamstring stretch, sit to stand   Recommended Other Services possible cardiac rehab   Consulted and Agree with Plan of Care Patient      Patient will benefit from skilled therapeutic intervention in order to improve the following deficits and impairments:  Decreased activity tolerance, Decreased balance, Decreased strength, Improper body mechanics, Impaired flexibility, Pain, Increased muscle spasms, Decreased endurance, Difficulty walking  Visit Diagnosis: Muscle weakness (generalized)  History of falling  Pain In Left Leg  Other symptoms and signs involving the  musculoskeletal system       G-Codes - 09/23/15 1232    Functional Assessment Tool Used Clinical judgement based on assessment of ROM, strength, functional performance   Functional Limitation Mobility: Walking and moving around   Mobility: Walking and Moving Around Current Status [phone]) At least 20 percent but less than 40 percent impaired, limited or restricted   Mobility: Walking and Moving Around Goal Status [phone]) At least 20 percent but less than 40 percent impaired, limited or restricted      Problem List Patient Active Problem List   Diagnosis Date Noted  . Numbness on right side 05/03/2015  . TIA (transient ischemic attack) 05/02/2015  . Hypertension 05/02/2015   5:28 PM,23-Sep-2015 Elly Modena PT, DPT [facility] [address] [place], [place], [address] Phone: [phone]   Fax:  [phone]  Name: Lakenzie Mcclafferty MRN: [id number] Date of Birth: 1940-06-07

## 2015-09-02 DIAGNOSIS — I4891 Unspecified atrial fibrillation: Secondary | ICD-10-CM | POA: Diagnosis not present

## 2015-09-06 ENCOUNTER — Ambulatory Visit (HOSPITAL_COMMUNITY): Payer: Medicare Other

## 2015-09-06 DIAGNOSIS — M6281 Muscle weakness (generalized): Secondary | ICD-10-CM

## 2015-09-06 DIAGNOSIS — R29898 Other symptoms and signs involving the musculoskeletal system: Secondary | ICD-10-CM

## 2015-09-06 DIAGNOSIS — M79605 Pain in left leg: Secondary | ICD-10-CM

## 2015-09-06 DIAGNOSIS — Z9181 History of falling: Secondary | ICD-10-CM | POA: Diagnosis not present

## 2015-09-06 NOTE — Therapy (Signed)
[place], [place], [address] Phone: [phone]   Fax:  [phone]  Physical Therapy Treatment  Patient Details  Name: Jeanette Yates MRN: [id number] Date of Birth: [birth year] Referring Provider: Marylu Lund, MD  Encounter Date: 09/06/2015      PT End of Session - 09/06/15 0915    Visit Number 5   Number of Visits 9   Date for PT Re-Evaluation 09/16/15   Authorization Type Medicare part A&B   Authorization Time Period 08/19/15 to 09/16/15   PT Start Time 0903   PT Stop Time 0947   PT Time Calculation (min) 44 min   Activity Tolerance Patient tolerated treatment well   Behavior During Therapy [facility] for tasks assessed/performed      Past Medical History  Diagnosis Date  . Hypertension   . Hypercholesteremia   . Diabetes mellitus without complication ([place])   . Fibromyalgia   . Macular degeneration   . CHF (congestive heart failure) ([place])   . Coronary artery disease     Past Surgical History  Procedure Laterality Date  . Cholecystectomy    . Cardiac surgery    . Fracture surgery    . Joint replacement      There were no vitals filed for this visit.      Subjective Assessment - 09/06/15 0903    Subjective Pt stated she has soreness Lt knee and Rt hip pain scale 3-5/10.  Reports compliance with HEP   Pertinent History HTN, DM, fibromyalgia, CAD, CHF, B  TKA   Patient Stated Goals Improve balance and strength    Currently in Pain? Yes   Pain Score 5    Pain Location --  Lt knee and Rt hip   Pain Orientation Right;Left   Pain Descriptors / Indicators Sore   Pain Type Acute pain   Pain Onset In the past 7 days   Aggravating Factors  sharp pains with movements such as bending forward    Pain Relieving Factors rest   Effect of Pain on Daily Activities difficulty bending down with increased knee pain             [facility] Adult PT Treatment/Exercise - 09/06/15 0001    Knee/Hip Exercises: Stretches    Active Hamstring Stretch Both;1 rep;30 seconds   Active Hamstring Stretch Limitations supine   Other Knee/Hip Stretches supine piriformis stretch x30 sec Lt   Knee/Hip Exercises: Supine   Quad Sets 15 reps   Bridges 15 reps   Other Supine Knee/Hip Exercises Bent knee raise with ab set   Other Supine Knee/Hip Exercises Bent knee fall out with ab set   Manual Therapy   Manual Therapy Soft tissue mobilization   Manual therapy comments performed separately from all other interventions    Soft tissue mobilization TFL/glute med/piriformis              PT Short Term Goals - 09/01/15 0925    PT SHORT TERM GOAL #1   Title Pt will demo consistency and independence with HEP   Time 1   Period Weeks   Status Achieved           PT Long Term Goals - 09/01/15 3491    PT LONG TERM GOAL #1   Title Pt will demo improved BLE strength to atleast 4/5 to improve ambulation and safety with stair negotiation.   Time 4   Period Weeks   Status Achieved   PT LONG TERM GOAL #  2   Title Pt will demo improved functional strength evident by 5x sit to stand without UE assist and in less than 13 sec.    Baseline 11.6 sec no UE   Time 4   Period Weeks   Status Achieved   PT LONG TERM GOAL #3   Title Pt will demo improved TUG time of no greater than 14 sec to indicated she is at a decreased risk of falls   Baseline 11.4 sec no AD   Time 4   Period Weeks   Status Achieved   PT LONG TERM GOAL #4   Title Pt will demo improved hip flexibility evident by negative Ely's test to improve pelvic alignment with activity.   Time 4   Period Weeks   Status Partially Met               Plan - 09/06/15 0916    Clinical Impression Statement Pt concerned with BP initially this session, c/o lightheadedness on occassion with no increased symptoms with any activity.  BP at 148/86 mmHg.  Pt reports back pain following HEP, reviewed form to assure correct technique with exercises, pt able to demonstrate  appropraite form and technqiues with all exercises.  Session focus on improving core activation with therapist facilitation for proper form and correct muscle activaiotn and stretches to improve flexibility.  Pt c/o increased Rt hip pain following therex.  Ended session manual SFT to TFL, piriformis and gluteal med to reduce tightness, pt reports no hip pain at end of session.     Rehab Potential Good   PT Duration 4 weeks   PT Treatment/Interventions Electrical Stimulation;Therapeutic exercise;Therapeutic activities;Stair training;Gait training;Balance training;Neuromuscular re-education;Patient/family education;Manual techniques;Taping;Passive range of motion   PT Next Visit Plan functional strengthening, hip flexibility (hamstring, piriformis, quad); ab set with bent knee raise, bent knee fall out   PT Home Exercise Plan Reviewed importance of continueing HEP; no updates given      Patient will benefit from skilled therapeutic intervention in order to improve the following deficits and impairments:  Decreased activity tolerance, Decreased balance, Decreased strength, Improper body mechanics, Impaired flexibility, Pain, Increased muscle spasms, Decreased endurance, Difficulty walking  Visit Diagnosis: Muscle weakness (generalized)  History of falling  Pain In Left Leg  Other symptoms and signs involving the musculoskeletal system     Problem List Patient Active Problem List   Diagnosis Date Noted  . Numbness on right side 05/03/2015  . TIA (transient ischemic attack) 05/02/2015  . Hypertension 05/02/2015   Ihor Austin, [place]; [place]  Aldona Lento 09/06/2015, 6:29 PM  [place] [address] [place], [place], [address] Phone: [phone]   Fax:  [phone]  Name: Jeanette Yates MRN: [id number] Date of Birth: 08/03/1940

## 2015-09-08 ENCOUNTER — Ambulatory Visit (HOSPITAL_COMMUNITY): Payer: Medicare Other

## 2015-09-08 DIAGNOSIS — R29898 Other symptoms and signs involving the musculoskeletal system: Secondary | ICD-10-CM

## 2015-09-08 DIAGNOSIS — M79605 Pain in left leg: Secondary | ICD-10-CM | POA: Diagnosis not present

## 2015-09-08 DIAGNOSIS — M6281 Muscle weakness (generalized): Secondary | ICD-10-CM

## 2015-09-08 DIAGNOSIS — Z9181 History of falling: Secondary | ICD-10-CM | POA: Diagnosis not present

## 2015-09-08 NOTE — Therapy (Signed)
[place], [place], [address] Phone: [phone]   Fax:  [phone]  Physical Therapy Treatment  Patient Details  Name: Jeanette Yates MRN: [id number] Date of Birth: Jul 26, 1940 Referring Provider: Marylu Lund, MD  Encounter Date: 09/08/2015      PT End of Session - 09/08/15 1004    Visit Number 6   Number of Visits 9   Date for PT Re-Evaluation 09/16/15   Authorization Type Medicare part A&B   Authorization Time Period 08/19/15 to 09/16/15   PT Start Time 0932   PT Stop Time 0957   PT Time Calculation (min) 25 min   Activity Tolerance Patient tolerated treatment well   Behavior During Therapy [facility] for tasks assessed/performed      Past Medical History  Diagnosis Date  . Hypertension   . Hypercholesteremia   . Diabetes mellitus without complication ([place])   . Fibromyalgia   . Macular degeneration   . CHF (congestive heart failure) ([place])   . Coronary artery disease     Past Surgical History  Procedure Laterality Date  . Cholecystectomy    . Cardiac surgery    . Fracture surgery    . Joint replacement      There were no vitals filed for this visit.      Subjective Assessment - 09/08/15 0935    Subjective Pt 30 min late for apt today.  Pt c/o Rt side LBP pain scale 6/10.  Reports radicular symptoms resolved.   Pertinent History HTN, DM, fibromyalgia, CAD, CHF, B  TKA   Patient Stated Goals Improve balance and strength    Currently in Pain? Yes   Pain Score 6    Pain Location Back   Pain Orientation Right;Lower   Pain Descriptors / Indicators Aching;Sore   Pain Type Acute pain   Pain Radiating Towards none   Pain Onset In the past 7 days   Aggravating Factors  sharp pains with movements such as bending forward   Pain Relieving Factors rest   Effect of Pain on Daily Activities difficulty bending down with increased knee pain                [facility] Adult PT Treatment/Exercise - 09/08/15  0001    Knee/Hip Exercises: Stretches   Other Knee/Hip Stretches sidelying QL stretch on 3 pillows   Knee/Hip Exercises: Supine   Bridges 15 reps   Other Supine Knee/Hip Exercises Bent knee raise with ab set   Other Supine Knee/Hip Exercises Bent knee fall out with ab set   Manual Therapy   Manual Therapy Soft tissue mobilization   Manual therapy comments performed separately from all other interventions    Soft tissue mobilization Rt QL and paraspinals             PT Short Term Goals - 09/01/15 0925    PT SHORT TERM GOAL #1   Title Pt will demo consistency and independence with HEP   Time 1   Period Weeks   Status Achieved           PT Long Term Goals - 09/01/15 6553    PT LONG TERM GOAL #1   Title Pt will demo improved BLE strength to atleast 4/5 to improve ambulation and safety with stair negotiation.   Time 4   Period Weeks   Status Achieved   PT LONG TERM GOAL #2   Title Pt will demo improved functional strength evident by 5x sit to  stand without UE assist and in less than 13 sec.    Baseline 11.6 sec no UE   Time 4   Period Weeks   Status Achieved   PT LONG TERM GOAL #3   Title Pt will demo improved TUG time of no greater than 14 sec to indicated she is at a decreased risk of falls   Baseline 11.4 sec no AD   Time 4   Period Weeks   Status Achieved   PT LONG TERM GOAL #4   Title Pt will demo improved hip flexibility evident by negative Ely's test to improve pelvic alignment with activity.   Time 4   Period Weeks   Status Partially Met               Plan - 09/08/15 1009    Clinical Impression Statement Pt ~ 30 min late for apt today, short session.  Session focus on improving abdominal strengthening to assist with LBP and improve lumbar stabiltiy.  Therapist facilitation to improve form and TrA activation to assist.  Added QL stretches to improve mobilty. Ended session with STM to Rt QL, paraspinals, cross friction on Rt iliac crest to reduce  overall tightness with reports of pain reduced to 2/10.  Improved gait mechanics noted.     Rehab Potential Good   PT Frequency 2x / week   PT Duration 4 weeks   PT Treatment/Interventions Electrical Stimulation;Therapeutic exercise;Therapeutic activities;Stair training;Gait training;Balance training;Neuromuscular re-education;Patient/family education;Manual techniques;Taping;Passive range of motion   PT Next Visit Plan functional strengthening, hip flexibility (hamstring, piriformis, quad); ab set with bent knee raise, bent knee fall out      Patient will benefit from skilled therapeutic intervention in order to improve the following deficits and impairments:  Decreased activity tolerance, Decreased balance, Decreased strength, Improper body mechanics, Impaired flexibility, Pain, Increased muscle spasms, Decreased endurance, Difficulty walking  Visit Diagnosis: Muscle weakness (generalized)  History of falling  Pain In Left Leg  Other symptoms and signs involving the musculoskeletal system     Problem List Patient Active Problem List   Diagnosis Date Noted  . Numbness on right side 05/03/2015  . TIA (transient ischemic attack) 05/02/2015  . Hypertension 05/02/2015   Jeanette Yates, Jeanette Yates; [place]  Jeanette Yates 09/08/2015, 10:14 AM  [place], [place], [address] Phone: [phone]   Fax:  [phone]  Name: Jeanette Yates MRN: [id number] Date of Birth: 1940/08/28

## 2015-09-12 ENCOUNTER — Ambulatory Visit (HOSPITAL_COMMUNITY): Payer: Medicare Other | Admitting: Physical Therapy

## 2015-09-13 ENCOUNTER — Ambulatory Visit (HOSPITAL_COMMUNITY): Payer: Medicare Other | Admitting: Physical Therapy

## 2015-09-13 DIAGNOSIS — M79605 Pain in left leg: Secondary | ICD-10-CM | POA: Diagnosis not present

## 2015-09-13 DIAGNOSIS — H353231 Exudative age-related macular degeneration, bilateral, with active choroidal neovascularization: Secondary | ICD-10-CM | POA: Diagnosis not present

## 2015-09-13 DIAGNOSIS — Z9181 History of falling: Secondary | ICD-10-CM | POA: Diagnosis not present

## 2015-09-13 DIAGNOSIS — R29898 Other symptoms and signs involving the musculoskeletal system: Secondary | ICD-10-CM

## 2015-09-13 DIAGNOSIS — H43813 Vitreous degeneration, bilateral: Secondary | ICD-10-CM | POA: Diagnosis not present

## 2015-09-13 DIAGNOSIS — M6281 Muscle weakness (generalized): Secondary | ICD-10-CM | POA: Diagnosis not present

## 2015-09-13 NOTE — Therapy (Signed)
[place], [place], [address] Phone: [phone]   Fax:  [phone]  Physical Therapy Treatment  Patient Details  Name: Jeanette Yates MRN: [id number] Date of Birth: 28-Jan-1941 Referring Provider: Marylu Lund, MD  Encounter Date: 09/13/2015      PT End of Session - 09/13/15 0943    Visit Number 6   Number of Visits 9   Date for PT Re-Evaluation 09/16/15   Authorization Type Medicare part A&B   Authorization Time Period 08/19/15 to 09/16/15   PT Start Time 0902   PT Stop Time 0943   PT Time Calculation (min) 41 min   Activity Tolerance Patient tolerated treatment well   Behavior During Therapy [facility] for tasks assessed/performed      Past Medical History  Diagnosis Date  . Hypertension   . Hypercholesteremia   . Diabetes mellitus without complication ([place])   . Fibromyalgia   . Macular degeneration   . CHF (congestive heart failure) ([place])   . Coronary artery disease     Past Surgical History  Procedure Laterality Date  . Cholecystectomy    . Cardiac surgery    . Fracture surgery    . Joint replacement      There were no vitals filed for this visit.      Subjective Assessment - 09/13/15 0903    Subjective Pt states she has a rough past couple of weeks, but was able to improve her symptoms with massage and extra exercises. She has been trying to get in her pool and is interested in some therex to perform while in the pool. No pain/issues currently.   Pertinent History HTN, DM, fibromyalgia, CAD, CHF, B  TKA   Patient Stated Goals Improve balance and strength    Currently in Pain? No/denies   Pain Onset In the past 7 days            [facility] PT Assessment - 09/13/15 0001    6 Minute Walk- Baseline   6 Minute Walk- Baseline yes   6 minute walk test results    Endurance additional comments 3 MWT: 628 ft, no AD                     [facility] Adult PT Treatment/Exercise - 09/13/15 0001     Therapeutic Activites    Therapeutic Activities Lifting   Lifting verbal/visual demo of lifting technique and lifting various size boxes x6 trials   Exercises   Other Exercises  seated resisted trunk rotation with UE punches and UE elevation x15 each.    Knee/Hip Exercises: Stretches   Active Hamstring Stretch Both;2 reps;30 seconds   Knee/Hip Exercises: Standing   Forward Step Up 1 set;Both;20 reps;Hand Hold: 2;Step Height: 4"                PT Education - 09/13/15 0948    Education provided Yes   Education Details discussed POC; importance of walking program for improved endurance; joining a gym to continue with aquatic exercise if interested; adapted HEP to perform in the pool at home    Person(s) Educated Patient   Methods Explanation;Demonstration;Handout   Comprehension Verbalized understanding;Returned demonstration          PT Short Term Goals - 09/01/15 0925    PT SHORT TERM GOAL #1   Title Pt will demo consistency and independence with HEP   Time 1   Period Weeks   Status Achieved  PT Long Term Goals - 09/01/15 0925    PT LONG TERM GOAL #1   Title Pt will demo improved BLE strength to atleast 4/5 to improve ambulation and safety with stair negotiation.   Time 4   Period Weeks   Status Achieved   PT LONG TERM GOAL #2   Title Pt will demo improved functional strength evident by 5x sit to stand without UE assist and in less than 13 sec.    Baseline 11.6 sec no UE   Time 4   Period Weeks   Status Achieved   PT LONG TERM GOAL #3   Title Pt will demo improved TUG time of no greater than 14 sec to indicated she is at a decreased risk of falls   Baseline 11.4 sec no AD   Time 4   Period Weeks   Status Achieved   PT LONG TERM GOAL #4   Title Pt will demo improved hip flexibility evident by negative Ely's test to improve pelvic alignment with activity.   Time 4   Period Weeks   Status Partially Met               Plan - 09/13/15 1015     Clinical Impression Statement Pt arrives saying this week has been much better since the last 2 weeks. She has been doing more exercise and stretching in the pool and feels it is helping. I reviewed some therex from her HEP that she can perform in the pool and provided her a handout for reference. Pt is progressing well towards her goals and I discussed possible d/c next visit assuming no exacerbation of symptoms. She is in agreement with this plan.   Rehab Potential Good   PT Frequency 2x / week   PT Duration 4 weeks   PT Treatment/Interventions Electrical Stimulation;Therapeutic exercise;Therapeutic activities;Stair training;Gait training;Balance training;Neuromuscular re-education;Patient/family education;Manual techniques;Taping;Passive range of motion   PT Next Visit Plan [employer] info; final HEP   PT Home Exercise Plan provided aquatic therex (standing hip ext/abd/flexion, marching, lunging, flutter kicks)   Recommended Other Services pt interested in cardiac rehab   Consulted and Agree with Plan of Care Patient      Patient will benefit from skilled therapeutic intervention in order to improve the following deficits and impairments:  Decreased activity tolerance, Decreased balance, Decreased strength, Improper body mechanics, Impaired flexibility, Pain, Increased muscle spasms, Decreased endurance, Difficulty walking  Visit Diagnosis: Muscle weakness (generalized)  History of falling  Pain In Left Leg  Other symptoms and signs involving the musculoskeletal system     Problem List Patient Active Problem List   Diagnosis Date Noted  . Numbness on right side 05/03/2015  . TIA (transient ischemic attack) 05/02/2015  . Hypertension 05/02/2015    10:24 AM,09/13/2015 Elly Modena PT, DPT [facility] [place] [address] [place], [place], [address] Phone: [phone]   Fax:   [phone]  Name: Jeanette Yates MRN: [id number] Date of Birth: 07-28-1940

## 2015-09-14 ENCOUNTER — Ambulatory Visit (INDEPENDENT_AMBULATORY_CARE_PROVIDER_SITE_OTHER): Payer: Medicare Other | Admitting: Cardiology

## 2015-09-14 ENCOUNTER — Encounter: Payer: Self-pay | Admitting: Cardiology

## 2015-09-14 VITALS — BP 142/74 | HR 56 | Ht 62.0 in | Wt 148.0 lb

## 2015-09-14 DIAGNOSIS — I10 Essential (primary) hypertension: Secondary | ICD-10-CM | POA: Diagnosis not present

## 2015-09-14 DIAGNOSIS — E785 Hyperlipidemia, unspecified: Secondary | ICD-10-CM

## 2015-09-14 DIAGNOSIS — I38 Endocarditis, valve unspecified: Secondary | ICD-10-CM

## 2015-09-14 DIAGNOSIS — I2510 Atherosclerotic heart disease of native coronary artery without angina pectoris: Secondary | ICD-10-CM | POA: Diagnosis not present

## 2015-09-14 DIAGNOSIS — I5022 Chronic systolic (congestive) heart failure: Secondary | ICD-10-CM | POA: Diagnosis not present

## 2015-09-14 MED ORDER — VALSARTAN 320 MG PO TABS: 320.0000 mg | ORAL_TABLET | Freq: Every day | ORAL | Status: DC

## 2015-09-14 MED ORDER — ATORVASTATIN CALCIUM 80 MG PO TABS: 80.0000 mg | ORAL_TABLET | Freq: Every day | ORAL | Status: DC

## 2015-09-14 NOTE — Progress Notes (Signed)
Clinical Summary Ms. Doering is a 75 y.o.female seen today as a new patient, she is self referred for the following medical problems.   1. Valvular heart disease - echo 10/2013 severe MR, moderate AI - TEE 11/2013 LVEF 25-30%, severe MR due to MV anular dilatation, moderate AI - 11/2013 s/p 23 mm Hancock II porcine valve AVR, MV repair with Medtronic 3D ring. Postop complicated by pleural effusion, afib - echo 04/2015 mild MR, normally functioning AVR  - notes some occasional SOB, mainly with humid weather. Highest level of activity is using push mower for 2 hours without troubles - No recent LE edema.    2. HTN - checks at home occasionally. Typically 140s/60-70s   3. Hyperlipidemia - compliant with lipitor - 04/2015 TC 161 TG 142 HDL 52 LDL 81   4. CAD - she reports prior MI - CABG 11/2013 SVG-RCA at time of valve surgery - no recent chest pain  5. DM2 - followed by pcp  6. Chronic systolic HF - echo 99991111 LVEF 30-35%, severe MR - cath 11/2013 without signifaicant CAD - 04/2015 echo LVEF 50-55% - compliant with meds - no recent SOB or DOE, no LE edema   7. PAF - occasinal palpitations, fairly mild    8. TIA - recent admit with TIA symptoms  Past Medical History  Diagnosis Date  . Hypertension   . Hypercholesteremia   . Diabetes mellitus without complication ([place])   . Fibromyalgia   . Macular degeneration   . CHF (congestive heart failure) ([place])   . Coronary artery disease      Allergies  Allergen Reactions  . Penicillins Anaphylaxis    Has patient had a PCN reaction causing immediate rash, facial/tongue/throat swelling, SOB or lightheadedness with hypotension: Yes Has patient had a PCN reaction causing severe rash involving mucus membranes or skin necrosis: Yes Has patient had a PCN reaction that required hospitalization Yes Has patient had a PCN reaction occurring within the last 10 years: No If all of the above answers are "NO", then may proceed  with Cephalosporin use.   Marland Kitchen Morphine And Related   . Tape      Current Outpatient Prescriptions  Medication Sig Dispense Refill  . aspirin EC 81 MG tablet Take 81 mg by mouth daily.    . butalbital-acetaminophen-caffeine (FIORICET, ESGIC) 50-325-40 MG tablet Take 1 tablet by mouth 2 (two) times daily as needed for headache.    . cyclobenzaprine (FLEXERIL) 5 MG tablet Take 1 tablet (5 mg total) by mouth 3 (three) times daily as needed (muscle soreness). (Patient not taking: Reported on 08/19/2015) 30 tablet 0  . furosemide (LASIX) 20 MG tablet Take 20 mg by mouth daily.    . metFORMIN (GLUCOPHAGE) 500 MG tablet Take 500 mg by mouth 2 (two) times daily with a meal.    . metoprolol tartrate (LOPRESSOR) 25 MG tablet Take 12.5 mg by mouth 2 (two) times daily.    . Multiple Vitamins-Minerals (PRESERVISION AREDS 2 PO) Take 1 capsule by mouth 2 (two) times daily.    . potassium chloride (K-DUR) 10 MEQ tablet Take 10 mEq by mouth daily.    . predniSONE (DELTASONE) 20 MG tablet Take 2 tablets (40 mg total) by mouth 2 (two) times daily with a meal. 16 tablet 0  . traMADol (ULTRAM) 50 MG tablet Take 1 tablet (50 mg total) by mouth every 6 (six) hours as needed. 16 tablet 0  . UNABLE TO FIND Med Name: eyelea inject  in both eye every 2 month    . valsartan (DIOVAN) 160 MG tablet Take 1 tablet (160 mg total) by mouth daily. 30 tablet 0  . warfarin (COUMADIN) 10 MG tablet Take 2 mg by mouth every evening. *Takes 10mg  and 2mg  tablets to complete dose of 12mg *    . warfarin (COUMADIN) 2 MG tablet Take 10 mg by mouth every evening. *Takes 10mg  and 2mg  tablets to complete dose of 12mg *     No current facility-administered medications for this visit.     Past Surgical History  Procedure Laterality Date  . Cholecystectomy    . Cardiac surgery    . Fracture surgery    . Joint replacement       Allergies  Allergen Reactions  . Penicillins Anaphylaxis    Has patient had a PCN reaction causing immediate  rash, facial/tongue/throat swelling, SOB or lightheadedness with hypotension: Yes Has patient had a PCN reaction causing severe rash involving mucus membranes or skin necrosis: Yes Has patient had a PCN reaction that required hospitalization Yes Has patient had a PCN reaction occurring within the last 10 years: No If all of the above answers are "NO", then may proceed with Cephalosporin use.   Marland Kitchen Morphine And Related   . Tape       Family History  Problem Relation Age of Onset  . Stroke Mother   . Heart failure Mother   . Diabetes Father   . Heart attack Father   . Stroke Father      Social History Ms. Aydt reports that she has quit smoking. She has never used smokeless tobacco. Ms. Wyche reports that she does not drink alcohol.   Review of Systems CONSTITUTIONAL: No weight loss, fever, chills, weakness or fatigue.  HEENT: Eyes: No visual loss, blurred vision, double vision or yellow sclerae.No hearing loss, sneezing, congestion, runny nose or sore throat.  SKIN: No rash or itching.  CARDIOVASCULAR: per HPI RESPIRATORY: No shortness of breath, cough or sputum.  GASTROINTESTINAL: No anorexia, nausea, vomiting or diarrhea. No abdominal pain or blood.  GENITOURINARY: No burning on urination, no polyuria NEUROLOGICAL: No headache, dizziness, syncope, paralysis, ataxia, numbness or tingling in the extremities. No change in bowel or bladder control.  MUSCULOSKELETAL: No muscle, back pain, joint pain or stiffness.  LYMPHATICS: No enlarged nodes. No history of splenectomy.  PSYCHIATRIC: No history of depression or anxiety.  ENDOCRINOLOGIC: No reports of sweating, cold or heat intolerance. No polyuria or polydipsia.  Marland Kitchen   Physical Examination Filed Vitals:   09/14/15 0928  BP: 142/74  Pulse: 56   Filed Vitals:   09/14/15 0928  Height: 5\' 2"  (1.575 m)  Weight: 148 lb (67.132 kg)    Gen: resting comfortably, no acute distress HEENT: no scleral icterus, pupils equal round  and reactive, no palptable cervical adenopathy,  CV: RRR, 2/6 systolic murmur RUSB,  Resp: Clear to auscultation bilaterally GI: abdomen is soft, non-tender, non-distended, normal bowel sounds, no hepatosplenomegaly MSK: extremities are warm, no edema.  Skin: warm, no rash Neuro:  no focal deficits Psych: appropriate affect   Diagnostic Studies 04/2015 echo Study Conclusions  - Left ventricle: The cavity size was normal. Wall thickness was  increased in a pattern of mild LVH. Systolic function was low  normal. The estimated ejection fraction was in the range of 50%  to 55%. Wall motion was normal; there were no regional wall  motion abnormalities. The study is not technically sufficient to  allow evaluation of LV diastolic  function. - Aortic valve: Normally functioning bioprosthetic aortic valve  noted. There was no stenosis. There was no regurgitation. Peak  velocity (S): 282 cm/s. Mean gradient (S): 17 mm Hg. - Aorta: Mild ascending aortic dilatation. Maximal diameter 3.73  cm. - Mitral valve: S/p mitral valve repair. There was mild  regurgitation. Valve area by pressure half-time: 1.39 cm^2. - Left atrium: The atrium was moderately to severely dilated. - Right ventricle: Systolic function was mildly reduced. - Tricuspid valve: There was mild regurgitation. - Pulmonary arteries: Systolic pressure was mildly increased. PA  peak pressure: 33 mm Hg (S).  04/2015 Carotid US IMPRESSION: 1. Moderate right carotid bifurcation atherosclerotic vascular disease. Visually degree of stenosis in the 50-69% range. Elevation of flow velocity ratios also present.  2. Mild left carotid bifurcation atherosclerotic vascular disease. Degree of stenosis less than 50%.  3. Vertebral arteries are patent with antegrade flow.  Assessment and Plan   1. Valvular heart disease - s/p tissue AVR and MV ring 11/2013 - no recent symptoms - echo 04/2015 with normal valve function -  continue to monitor  2. HTN - above goal given her history of DM2. We will increase valsartan to 320mg  daily, check BMET in 2 weeks  3. Hyperlipidemia - in setting of known CAD change to high dose statin atova 80mg  daily  4. Chronic systolic HF - LVEF has normalized s/p valve surgery and CABG - no recent symptoms - continue current meds  5. PAF - mild symptoms, conitnue current meds. CHADS2Vasc score of 7, continue coumadin. She is not interested in NOACs. She will establish in our coumadin clinic  F/u 6 months     Arnoldo Lenis, M.D.

## 2015-09-14 NOTE — Patient Instructions (Addendum)
Your physician wants you to follow-up in: 6 months You will receive a reminder letter in the mail two months in advance. If you don't receive a letter, please call our office to schedule the follow-up appointment.   Please see Edrick Oh for Coumadin clinic in the next 2 weeks   INCREASE Lipitor to 80 mg daily   INCREASE Valsartan to 320 mg daily   Get lab work :BMET in 2 weeks       Thank you for choosing [facility] !

## 2015-09-15 ENCOUNTER — Ambulatory Visit (HOSPITAL_COMMUNITY): Payer: Medicare Other | Admitting: Physical Therapy

## 2015-09-15 DIAGNOSIS — M79605 Pain in left leg: Secondary | ICD-10-CM | POA: Diagnosis not present

## 2015-09-15 DIAGNOSIS — M6281 Muscle weakness (generalized): Secondary | ICD-10-CM

## 2015-09-15 DIAGNOSIS — Z9181 History of falling: Secondary | ICD-10-CM

## 2015-09-15 DIAGNOSIS — R29898 Other symptoms and signs involving the musculoskeletal system: Secondary | ICD-10-CM

## 2015-09-15 NOTE — Therapy (Signed)
[place], [place], [address] Phone: [phone]   Fax:  [phone]  Physical Therapy Treatment  Patient Details  Name: Jeanette Yates MRN: [id number] Date of Birth: 24-Jan-1941 Referring Provider: Marylu Lund, MD  Encounter Date: 09/15/2015      PT End of Session - 09/15/15 1025    Visit Number 7   Number of Visits 9   Date for PT Re-Evaluation 09/16/15   Authorization Type Medicare part A&B   Authorization Time Period 08/19/15 to 09/16/15   PT Start Time 0955   PT Stop Time 1025   PT Time Calculation (min) 30 min   Activity Tolerance Patient tolerated treatment well   Behavior During Therapy [facility] for tasks assessed/performed      Past Medical History  Diagnosis Date  . Hypertension   . Hypercholesteremia   . Diabetes mellitus without complication ([place])   . Fibromyalgia   . Macular degeneration   . CHF (congestive heart failure) ([place])   . Coronary artery disease     Past Surgical History  Procedure Laterality Date  . Cholecystectomy    . Cardiac surgery    . Fracture surgery    . Joint replacement      There were no vitals filed for this visit.      Subjective Assessment - 09/15/15 1032    Subjective Pt was 10 minutes late for appt.  States she is doing well today without pain.   Currently in Pain? No/denies            [facility] PT Assessment - 09/15/15 0001    Strength   Right Hip Flexion 5/5   Right Hip Extension 4+/5   Right Hip ABduction 5/5   Left Hip Flexion 4+/5   Left Hip Extension 4+/5   Left Hip ABduction 5/5   Right Knee Flexion 5/5   Right Knee Extension 5/5   Left Knee Flexion 5/5   Left Knee Extension 5/5   Right Ankle Dorsiflexion 5/5   Left Ankle Dorsiflexion 5/5   Flexibility   Hamstrings L/R: 90 degrees   Piriformis 10% limited    6 minute walk test results    Endurance additional comments 3 MWT: 628 ft, no AD   Berg Balance Test   Sit to Stand Able to stand  without using hands and stabilize independently   Standing Unsupported Able to stand safely 2 minutes   Sitting with Back Unsupported but Feet Supported on Floor or Stool Able to sit safely and securely 2 minutes   Stand to Sit Sits safely with minimal use of hands   Transfers Able to transfer safely, minor use of hands   Standing Unsupported with Eyes Closed Able to stand 10 seconds safely   Standing Ubsupported with Feet Together Able to place feet together independently and stand 1 minute safely   From Standing, Reach Forward with Outstretched Arm Can reach confidently >25 cm (10")   From Standing Position, Pick up Object from Floor Able to pick up shoe safely and easily   From Standing Position, Turn to Look Behind Over each Shoulder Looks behind from both sides and weight shifts well   Turn 360 Degrees Able to turn 360 degrees safely in 4 seconds or less   Standing Unsupported, Alternately Place Feet on Step/Stool Able to stand independently and safely and complete 8 steps in 20 seconds   Standing Unsupported, One Foot in Front Able to place foot tandem independently and hold  30 seconds   Standing on One Leg Able to lift leg independently and hold > 10 seconds   Total Score 56   Timed Up and Go Test   TUG Comments 9.6 without use of UE (was 16.5 with use of UE to get out of chair)                               PT Short Term Goals - 09/01/15 0925    PT SHORT TERM GOAL #1   Title Pt will demo consistency and independence with HEP   Time 1   Period Weeks   Status Achieved           PT Long Term Goals - 09/15/15 1025    PT LONG TERM GOAL #1   Title Pt will demo improved BLE strength to atleast 4/5 to improve ambulation and safety with stair negotiation.   Time 4   Period Weeks   Status Achieved   PT LONG TERM GOAL #2   Title Pt will demo improved functional strength evident by 5x sit to stand without UE assist and in less than 13 sec.    Baseline 11.6  sec no UE   Time 4   Period Weeks   Status Achieved   PT LONG TERM GOAL #3   Title Pt will demo improved TUG time of no greater than 14 sec to indicated she is at a decreased risk of falls   Baseline 11.4 sec no AD   Time 4   Period Weeks   Status Achieved   PT LONG TERM GOAL #4   Title Pt will demo improved hip flexibility evident by negative Ely's test to improve pelvic alignment with activity.   Time 4   Period Weeks   Status Partially Met               Plan - 09/15/15 1026    Clinical Impression Statement Re-evaluation completed for discharge to HEP and continuation at [facility] and home pool.  Completed MMT of LE muscles with noted improvement.  Only minor weekness in hip extensors bilaterally and Lt hip flexor.   Pt  with improved safety and gait speed as demonstrated by TUG test and BERG tests which are now WNL.  Pt given information regarding [employer] with pricing and 2week trial membership.  Pt also instructed to look into the silver sneaker program and contact primary if still with interest in cardiac rehab.  Pt verbalized she had all other resourses in place and agreed with discharge to HEP.   Rehab Potential Good   PT Frequency 2x / week   PT Duration 4 weeks   PT Treatment/Interventions Electrical Stimulation;Therapeutic exercise;Therapeutic activities;Stair training;Gait training;Balance training;Neuromuscular re-education;Patient/family education;Manual techniques;Taping;Passive range of motion   PT Next Visit Plan Discharge as not longer with skilled physical therapy need.  Patient in agreement.    Consulted and Agree with Plan of Care Patient      Patient will benefit from skilled therapeutic intervention in order to improve the following deficits and impairments:  Decreased activity tolerance, Decreased balance, Decreased strength, Improper body mechanics, Impaired flexibility, Pain, Increased muscle spasms, Decreased endurance, Difficulty walking  Visit  Diagnosis: Muscle weakness (generalized)  History of falling  Pain In Left Leg  Other symptoms and signs involving the musculoskeletal system     Problem List Patient Active Problem List   Diagnosis Date Noted  . Numbness on right side  05/03/2015  . TIA (transient ischemic attack) 05/02/2015  . Hypertension 05/02/2015    Teena Irani, PTA/CLT [phone]  09/15/2015, 10:32 AM  [place], [place], [address] Phone: [phone]   Fax:  [phone]  Name: Jeanette Yates MRN: [id number] Date of Birth: 14-Feb-1941    PHYSICAL THERAPY DISCHARGE SUMMARY  Visits from Start of Care: 7  Current functional level related to goals / functional outcomes: Improved TUG/5x sit to stand and BLE strength. Pt independent and consistent with HEP.    Remaining deficits: Minor limitations in hip extensors strength   Education / Equipment: HEP, information concerning [employer] membership   Plan: Patient agrees to discharge.  Patient goals were met. Patient is being discharged due to meeting the stated rehab goals.  ?????       6:14 PM,09/15/2015 Elly Modena PT, [facility] [phone]

## 2015-09-20 ENCOUNTER — Encounter (HOSPITAL_COMMUNITY): Payer: Medicare Other | Admitting: Physical Therapy

## 2015-09-22 ENCOUNTER — Encounter (HOSPITAL_COMMUNITY): Payer: Medicare Other | Admitting: Physical Therapy

## 2015-09-29 ENCOUNTER — Ambulatory Visit (INDEPENDENT_AMBULATORY_CARE_PROVIDER_SITE_OTHER): Payer: Medicare Other | Admitting: *Deleted

## 2015-09-29 DIAGNOSIS — I4891 Unspecified atrial fibrillation: Secondary | ICD-10-CM

## 2015-09-29 DIAGNOSIS — Z5181 Encounter for therapeutic drug level monitoring: Secondary | ICD-10-CM | POA: Diagnosis not present

## 2015-09-29 DIAGNOSIS — Z954 Presence of other heart-valve replacement: Secondary | ICD-10-CM

## 2015-09-29 DIAGNOSIS — Z952 Presence of prosthetic heart valve: Secondary | ICD-10-CM

## 2015-09-29 LAB — POCT INR: INR: 1.9

## 2015-10-11 DIAGNOSIS — Z7984 Long term (current) use of oral hypoglycemic drugs: Secondary | ICD-10-CM | POA: Diagnosis not present

## 2015-10-11 DIAGNOSIS — H353233 Exudative age-related macular degeneration, bilateral, with inactive scar: Secondary | ICD-10-CM | POA: Diagnosis not present

## 2015-10-11 DIAGNOSIS — E119 Type 2 diabetes mellitus without complications: Secondary | ICD-10-CM | POA: Diagnosis not present

## 2015-10-11 DIAGNOSIS — H524 Presbyopia: Secondary | ICD-10-CM | POA: Diagnosis not present

## 2015-10-11 LAB — HM DIABETES EYE EXAM

## 2015-10-13 ENCOUNTER — Ambulatory Visit (INDEPENDENT_AMBULATORY_CARE_PROVIDER_SITE_OTHER): Payer: Medicare Other | Admitting: *Deleted

## 2015-10-13 DIAGNOSIS — I4891 Unspecified atrial fibrillation: Secondary | ICD-10-CM | POA: Diagnosis not present

## 2015-10-13 DIAGNOSIS — Z5181 Encounter for therapeutic drug level monitoring: Secondary | ICD-10-CM | POA: Diagnosis not present

## 2015-10-13 DIAGNOSIS — Z954 Presence of other heart-valve replacement: Secondary | ICD-10-CM | POA: Diagnosis not present

## 2015-10-13 DIAGNOSIS — Z952 Presence of prosthetic heart valve: Secondary | ICD-10-CM

## 2015-10-13 LAB — POCT INR: INR: 2.6

## 2015-10-14 ENCOUNTER — Encounter: Payer: Self-pay | Admitting: *Deleted

## 2015-11-01 ENCOUNTER — Ambulatory Visit (INDEPENDENT_AMBULATORY_CARE_PROVIDER_SITE_OTHER): Payer: Medicare Other | Admitting: Nurse Practitioner

## 2015-11-01 ENCOUNTER — Encounter: Payer: Self-pay | Admitting: Nurse Practitioner

## 2015-11-01 ENCOUNTER — Ambulatory Visit (INDEPENDENT_AMBULATORY_CARE_PROVIDER_SITE_OTHER): Payer: Medicare Other | Admitting: *Deleted

## 2015-11-01 VITALS — BP 132/74 | HR 56 | Temp 98.1°F | Resp 17 | Ht 62.5 in | Wt 149.4 lb

## 2015-11-01 DIAGNOSIS — Z23 Encounter for immunization: Secondary | ICD-10-CM

## 2015-11-01 DIAGNOSIS — K224 Dyskinesia of esophagus: Secondary | ICD-10-CM | POA: Diagnosis not present

## 2015-11-01 DIAGNOSIS — I509 Heart failure, unspecified: Secondary | ICD-10-CM | POA: Insufficient documentation

## 2015-11-01 DIAGNOSIS — I10 Essential (primary) hypertension: Secondary | ICD-10-CM

## 2015-11-01 DIAGNOSIS — M1990 Unspecified osteoarthritis, unspecified site: Secondary | ICD-10-CM

## 2015-11-01 DIAGNOSIS — M797 Fibromyalgia: Secondary | ICD-10-CM | POA: Diagnosis not present

## 2015-11-01 DIAGNOSIS — Z952 Presence of prosthetic heart valve: Secondary | ICD-10-CM

## 2015-11-01 DIAGNOSIS — E118 Type 2 diabetes mellitus with unspecified complications: Secondary | ICD-10-CM | POA: Diagnosis not present

## 2015-11-01 DIAGNOSIS — I4891 Unspecified atrial fibrillation: Secondary | ICD-10-CM

## 2015-11-01 DIAGNOSIS — Z5181 Encounter for therapeutic drug level monitoring: Secondary | ICD-10-CM

## 2015-11-01 DIAGNOSIS — Z954 Presence of other heart-valve replacement: Secondary | ICD-10-CM

## 2015-11-01 DIAGNOSIS — I480 Paroxysmal atrial fibrillation: Secondary | ICD-10-CM | POA: Diagnosis not present

## 2015-11-01 LAB — PROTIME-INR: PROTIME: 82.3 s — AB (ref 10.0–13.8)

## 2015-11-01 LAB — POCT INR
INR: 3.6
INR: 8.3 — AB (ref 0.9–1.1)

## 2015-11-01 MED ORDER — ZOSTER VACCINE LIVE 19400 UNT/0.65ML ~~LOC~~ SUSR: 0.6500 mL | Freq: Once | SUBCUTANEOUS | 0 refills | Status: AC

## 2015-11-01 MED ORDER — TRAMADOL HCL 50 MG PO TABS: ORAL_TABLET | ORAL | 0 refills | Status: DC

## 2015-11-01 NOTE — Patient Instructions (Signed)
To follow up in 1 month with lab work prior to visit

## 2015-11-01 NOTE — Progress Notes (Addendum)
PCP: PROVIDER NOT IN SYSTEM  Advanced Directive information Does patient have an advance directive?: No  Allergies  Allergen Reactions  . Penicillins Anaphylaxis    Has patient had a PCN reaction causing immediate rash, facial/tongue/throat swelling, SOB or lightheadedness with hypotension: Yes Has patient had a PCN reaction causing severe rash involving mucus membranes or skin necrosis: Yes Has patient had a PCN reaction that required hospitalization Yes Has patient had a PCN reaction occurring within the last 10 years: No If all of the above answers are "NO", then may proceed with Cephalosporin use.   Marland Kitchen Morphine And Related   . Tape     Chief Complaint  Patient presents with  . Medical Management of Chronic Issues    Establish as new patient. Just moved to [place].   Marland Kitchen Other    Daughter, [name], in room      HPI: Patient is a 75 y.o. female seen in the office today to establish care. Pt previously living in [place] and now living with daughter.  She has been back and forth several months.  Full physical with previous PCP in April or May coumadin for a fib- being managed by coumadin clinic in [place].  Questions about tramadol prescriptions- was told it was okay to take 2 at night.  Has a lot of medications- refills were happening automatically Does not need refills now.   Review of Systems:  Review of Systems  Constitutional: Positive for unexpected weight change (weight gain). Negative for activity change, appetite change and fatigue.  HENT: Positive for hearing loss. Negative for congestion.   Eyes: Positive for visual disturbance.       Hx of macular degeneration and cataracts  Respiratory: Positive for shortness of breath (with exertion). Negative for cough.   Cardiovascular: Positive for palpitations (from a fib). Negative for chest pain and leg swelling.  Gastrointestinal: Positive for nausea (occasionally). Negative for abdominal pain, constipation and diarrhea.   Incontinence of bowel   Genitourinary: Positive for urgency. Negative for difficulty urinating and dysuria.       Incontinent of bladder   Musculoskeletal: Positive for arthralgias, gait problem (off balance, 3 in last year), joint swelling and myalgias.  Skin: Negative for color change and wound.  Allergic/Immunologic: Positive for environmental allergies.  Neurological: Positive for dizziness (minior, rare). Negative for weakness.  Hematological: Bruises/bleeds easily (on coumadin ).  Psychiatric/Behavioral: Positive for confusion and sleep disturbance. Negative for agitation and behavioral problems.    Past Medical History:  Diagnosis Date  . Atrial fibrillation ([place])   . Cataract   . CHF (congestive heart failure) ([place])   . Coronary artery disease   . Diabetes mellitus without complication ([place])   . Esophageal spasm   . Fibromyalgia   . Fibromyalgia   . Heart murmur   . Hypercholesteremia   . Hypertension   . Leaky heart valve   . Lyme disease   . Macular degeneration   . Macular degeneration of both eyes   . Osteoarthritis   . Pneumonia   . Sciatica   . Type II diabetes mellitus ([place])    Past Surgical History:  Procedure Laterality Date  . AORTIC VALVE REPLACEMENT    . CARDIAC SURGERY    . CHOLECYSTECTOMY    . esophageal repair surgery    . FRACTURE SURGERY    . JOINT REPLACEMENT    . leaky mitral valve repair    . right knee replacement     Social History:  reports that she has quit smoking. Her smoking use included Cigarettes. She has a 22.50 pack-year smoking history. She has never used smokeless tobacco. She reports that she does not drink alcohol or use drugs.  Family History  Problem Relation Age of Onset  . Stroke Mother   . Heart failure Mother   . Diabetes Father   . Heart attack Father   . Stroke Father     Medications: Patient's Medications  New Prescriptions   No medications on file  Previous Medications   ACETAMINOPHEN (TYLENOL) 500 MG  TABLET    Take 500 mg by mouth every 6 (six) hours as needed.   ASPIRIN EC 81 MG TABLET    Take 81 mg by mouth every morning. 8 am   ATORVASTATIN (LIPITOR) 80 MG TABLET    Take 1 tablet (80 mg total) by mouth daily.   FUROSEMIDE (LASIX) 20 MG TABLET    Take 20 mg by mouth every morning. 8 am   METFORMIN (GLUCOPHAGE) 500 MG TABLET    Take 500 mg by mouth 2 (two) times daily with a meal. 8am and 7pm   METOPROLOL TARTRATE (LOPRESSOR) 25 MG TABLET    Take 12.5 mg by mouth 2 (two) times daily. 8 am & 7 pm   MULTIPLE VITAMINS-MINERALS (PRESERVISION AREDS 2 PO)    Take 1 capsule by mouth 2 (two) times daily. 8 am and 7 pm   POTASSIUM CHLORIDE (K-DUR) 10 MEQ TABLET    Take 10 mEq by mouth every morning. 8 am   TOBRAMYCIN (TOBREX) 0.3 % OPHTHALMIC SOLUTION    Use 1 drop in both eyes four times daily 2 days before eye injections.   TRAMADOL (ULTRAM) 50 MG TABLET    Take 1 tablet (50 mg total) by mouth every 6 (six) hours as needed.   VALSARTAN (DIOVAN) 320 MG TABLET    Take 1 tablet (320 mg total) by mouth daily.   WARFARIN (COUMADIN) 10 MG TABLET    Take 5 mg by mouth daily.   WARFARIN (COUMADIN) 2 MG TABLET    Take a half tablet (1mg ) Tues, Wed, Fri, Sat, Sun. On Mon and Thurs, take 1 tablet (2mg )  Modified Medications   No medications on file  Discontinued Medications   DIPHENHYDRAMINE (SOMINEX) 25 MG TABLET    1-2 tablets by mouth every 6 hours prn   UNABLE TO FIND    Med Name: eyelea inject in both eye every 2 month   WARFARIN (COUMADIN) 10 MG TABLET    Take 2 mg by mouth every evening. *Takes 10mg  and 2mg  tablets to complete dose of 12mg *   WARFARIN (COUMADIN) 2 MG TABLET    Take 10 mg by mouth every evening. *Takes 10mg  and 2mg  tablets to complete dose of 12mg *     Physical Exam:  Vitals:   11/01/15 0911  BP: 132/74  Pulse: (!) 56  Resp: 17  Temp: 98.1 F (36.7 C)  TempSrc: Oral  SpO2: 96%  Weight: 149 lb 6.4 oz (67.8 kg)  Height: 5' 2.5" (1.588 m)   Body mass index is 26.89  kg/m.  Physical Exam  Constitutional: She is oriented to person, place, and time. She appears well-developed and well-nourished. No distress.  HENT:  Head: Normocephalic and atraumatic.  Mouth/Throat: Oropharynx is clear and moist. No oropharyngeal exudate.  Eyes: Conjunctivae are normal. Pupils are equal, round, and reactive to light.  Neck: Normal range of motion. Neck supple.  Cardiovascular: Normal rate and regular rhythm.  Murmur heard. Pulmonary/Chest: Effort normal and breath sounds normal.  Abdominal: Soft. Bowel sounds are normal.  Musculoskeletal: She exhibits no edema or tenderness.  Neurological: She is alert and oriented to person, place, and time.  Skin: Skin is warm and dry. She is not diaphoretic.  Psychiatric: She has a normal mood and affect.    Labs reviewed: Basic Metabolic Panel:  Recent Labs  05/02/15 1435  NA 141  K 3.6  CL 104  CO2 25  GLUCOSE 155*  BUN 24*  CREATININE 0.99  CALCIUM 9.2   Liver Function Tests:  Recent Labs  05/02/15 1435  AST 23  ALT 16  ALKPHOS 97  BILITOT 0.4  PROT 7.0  ALBUMIN 3.9   No results for input(s): LIPASE, AMYLASE in the last 8760 hours. No results for input(s): AMMONIA in the last 8760 hours. CBC:  Recent Labs  05/02/15 1435  WBC 9.2  NEUTROABS 6.1  HGB 12.4  HCT 37.5  MCV 84.7  PLT 353   Lipid Panel:  Recent Labs  05/03/15 0537  CHOL 161  HDL 52  LDLCALC 81  TRIG 142  CHOLHDL 3.1   TSH: No results for input(s): TSH in the last 8760 hours. A1C: Lab Results  Component Value Date   HGBA1C 7.1 (H) 05/02/2015     Assessment/Plan 1. Essential hypertension Blood pressure controlled on current regimen, cont medication and lifestyle modification  - COMPLETE METABOLIC PANEL WITH GFR; Future  2. Type 2 diabetes mellitus with complication, without long-term current use of insulin (HCC) -cont on metformin 500 mg BID, will follow up labs - TSH; Future - Hemoglobin A1c; Future  3.  Osteoarthritis, unspecified osteoarthritis type, unspecified site Chronic arthritis pain, controlled on tylenol and ultram - traMADol (ULTRAM) 50 MG tablet; 1-2 tablets every 6 hours as needed for pain, Max of 6 tablets in 24 hours  Dispense: 120 tablet; Refill: 0  4. Fibromyalgia -pain stable, conts on ultram and tylenol PRN  5. Paroxysmal atrial fibrillation (HCC) Rate controlled on lopressor, conts on coumadin per coumadin clinic  - CBC with Differential/Platelets; Future  6. Congestive heart failure, unspecified congestive heart failure chronicity, unspecified congestive heart failure type (HCC) Euvolemic, conts on lasix with potassium supplement, valsartan, and lopressor   7. Esophageal spasm -currently not an issues, has had esophageal dilation in the past with improvement of symptoms. May need a GI referral in the future   8. Need for prophylactic vaccination against Streptococcus pneumoniae (pneumococcus) - Pneumococcal conjugate vaccine 13-valent  Follow up in 4 weeks with labs prior to visit for EV with [facility] Amand Lemoine K. Harle Battiest  [facility] [phone] 8 am - 5 pm) [phone] (after hours)

## 2015-11-17 ENCOUNTER — Ambulatory Visit (INDEPENDENT_AMBULATORY_CARE_PROVIDER_SITE_OTHER): Payer: Medicare Other | Admitting: *Deleted

## 2015-11-17 DIAGNOSIS — I4891 Unspecified atrial fibrillation: Secondary | ICD-10-CM | POA: Diagnosis not present

## 2015-11-17 DIAGNOSIS — Z954 Presence of other heart-valve replacement: Secondary | ICD-10-CM

## 2015-11-17 DIAGNOSIS — Z5181 Encounter for therapeutic drug level monitoring: Secondary | ICD-10-CM | POA: Diagnosis not present

## 2015-11-17 DIAGNOSIS — Z952 Presence of prosthetic heart valve: Secondary | ICD-10-CM

## 2015-11-17 LAB — POCT INR: INR: 3.1

## 2015-11-18 DIAGNOSIS — H43813 Vitreous degeneration, bilateral: Secondary | ICD-10-CM | POA: Diagnosis not present

## 2015-11-18 DIAGNOSIS — H353231 Exudative age-related macular degeneration, bilateral, with active choroidal neovascularization: Secondary | ICD-10-CM | POA: Diagnosis not present

## 2015-12-02 ENCOUNTER — Other Ambulatory Visit: Payer: Medicare Other

## 2015-12-02 DIAGNOSIS — I480 Paroxysmal atrial fibrillation: Secondary | ICD-10-CM

## 2015-12-02 DIAGNOSIS — I10 Essential (primary) hypertension: Secondary | ICD-10-CM

## 2015-12-02 DIAGNOSIS — E118 Type 2 diabetes mellitus with unspecified complications: Secondary | ICD-10-CM

## 2015-12-02 LAB — COMPLETE METABOLIC PANEL WITH GFR
ALBUMIN: 4.2 g/dL (ref 3.6–5.1)
ALK PHOS: 93 U/L (ref 33–130)
ALT: 19 U/L (ref 6–29)
AST: 21 U/L (ref 10–35)
BUN: 20 mg/dL (ref 7–25)
CALCIUM: 9.7 mg/dL (ref 8.6–10.4)
CHLORIDE: 102 mmol/L (ref 98–110)
CO2: 28 mmol/L (ref 20–31)
CREATININE: 0.77 mg/dL (ref 0.60–0.93)
GFR, Est African American: 89 mL/min (ref >=60)
GFR, Est Non African American: 77 mL/min (ref >=60)
GLUCOSE: 115 mg/dL — AB (ref 65–99)
Potassium: 4.2 mmol/L (ref 3.5–5.3)
SODIUM: 141 mmol/L (ref 135–146)
Total Bilirubin: 0.6 mg/dL (ref 0.2–1.2)
Total Protein: 7.2 g/dL (ref 6.1–8.1)

## 2015-12-02 LAB — TSH: TSH: 0.7 m[IU]/L

## 2015-12-02 LAB — CBC WITH DIFFERENTIAL/PLATELET
BASOS ABS: 0 {cells}/uL (ref 0–200)
Basophils Relative: 0 %
EOS ABS: 238 {cells}/uL (ref 15–500)
EOS PCT: 2 %
HCT: 39.2 % (ref 35.0–45.0)
HEMOGLOBIN: 13.1 g/dL (ref 11.7–15.5)
LYMPHS ABS: 1666 {cells}/uL (ref 850–3900)
Lymphocytes Relative: 14 %
MCH: 27.7 pg (ref 27.0–33.0)
MCHC: 33.4 g/dL (ref 32.0–36.0)
MCV: 82.9 fL (ref 80.0–100.0)
MPV: 10 fL (ref 7.5–12.5)
Monocytes Absolute: 833 cells/uL (ref 200–950)
Monocytes Relative: 7 %
NEUTROS ABS: 9163 {cells}/uL — AB (ref 1500–7800)
NEUTROS PCT: 77 %
Platelets: 374 10*3/uL (ref 140–400)
RBC: 4.73 MIL/uL (ref 3.80–5.10)
RDW: 14.2 % (ref 11.0–15.0)
WBC: 11.9 10*3/uL — ABNORMAL HIGH (ref 3.8–10.8)

## 2015-12-03 LAB — HEMOGLOBIN A1C
HEMOGLOBIN A1C: 6.9 % — AB (ref <5.7)
Mean Plasma Glucose: 151 mg/dL

## 2015-12-06 ENCOUNTER — Ambulatory Visit (INDEPENDENT_AMBULATORY_CARE_PROVIDER_SITE_OTHER): Payer: Medicare Other | Admitting: Nurse Practitioner

## 2015-12-06 ENCOUNTER — Telehealth: Payer: Self-pay | Admitting: Cardiology

## 2015-12-06 ENCOUNTER — Encounter: Payer: Self-pay | Admitting: Nurse Practitioner

## 2015-12-06 VITALS — BP 138/76 | HR 57 | Temp 98.2°F | Resp 18 | Ht 63.0 in | Wt 151.6 lb

## 2015-12-06 DIAGNOSIS — R4701 Aphasia: Secondary | ICD-10-CM

## 2015-12-06 DIAGNOSIS — Z8673 Personal history of transient ischemic attack (TIA), and cerebral infarction without residual deficits: Secondary | ICD-10-CM | POA: Diagnosis not present

## 2015-12-06 DIAGNOSIS — E2839 Other primary ovarian failure: Secondary | ICD-10-CM | POA: Diagnosis not present

## 2015-12-06 DIAGNOSIS — I2510 Atherosclerotic heart disease of native coronary artery without angina pectoris: Secondary | ICD-10-CM

## 2015-12-06 DIAGNOSIS — I10 Essential (primary) hypertension: Secondary | ICD-10-CM | POA: Diagnosis not present

## 2015-12-06 DIAGNOSIS — E118 Type 2 diabetes mellitus with unspecified complications: Secondary | ICD-10-CM | POA: Diagnosis not present

## 2015-12-06 DIAGNOSIS — Z23 Encounter for immunization: Secondary | ICD-10-CM

## 2015-12-06 DIAGNOSIS — Z1231 Encounter for screening mammogram for malignant neoplasm of breast: Secondary | ICD-10-CM | POA: Diagnosis not present

## 2015-12-06 DIAGNOSIS — M1990 Unspecified osteoarthritis, unspecified site: Secondary | ICD-10-CM

## 2015-12-06 DIAGNOSIS — I480 Paroxysmal atrial fibrillation: Secondary | ICD-10-CM | POA: Diagnosis not present

## 2015-12-06 MED ORDER — METFORMIN HCL 500 MG PO TABS: 500.0000 mg | ORAL_TABLET | Freq: Two times a day (BID) | ORAL | 3 refills | Status: DC

## 2015-12-06 NOTE — Telephone Encounter (Signed)
Jeanette Yates (daughter) called stating that her mother has taken too much of her coumdin between last night and sometime today.  Daughter needs to know about giving coumdin tonight. Please call [phone].

## 2015-12-06 NOTE — Telephone Encounter (Signed)
Pt's daughter called stating that her Mother took her coumadin correctly yesterday and then she had already set her coumadin out to be taken tonight Sept 12th  and her Mother got up during the night and took the coumadin this morning. Pt's daughter instructed since she did not take any extra coumadin just took the dose too early to take that as her dose for today and then continue tomorrow as usual and she states understanding Our clinic phone number given to pt's daughter as well to call if has any more concerns

## 2015-12-06 NOTE — Progress Notes (Signed)
Provider: Lauree Chandler, NP  Patient Care Team: Lauree Chandler, NP as PCP - General (Geriatric Medicine)  Extended Emergency Contact Information Primary Emergency Contact: Welty,Bonnie Address: [place], [place] [address] Johnnette Litter of [place] Phone: [phone] Relation: Daughter Secondary Emergency Contact: Perdon,Fred Address: [place], [place] [address] Johnnette Litter of [place] Phone: [phone] Relation: Son Allergies  Allergen Reactions  . Penicillins Anaphylaxis    Has patient had a PCN reaction causing immediate rash, facial/tongue/throat swelling, SOB or lightheadedness with hypotension: Yes Has patient had a PCN reaction causing severe rash involving mucus membranes or skin necrosis: Yes Has patient had a PCN reaction that required hospitalization Yes Has patient had a PCN reaction occurring within the last 10 years: No If all of the above answers are "NO", then may proceed with Cephalosporin use.   . Latex   . Celecoxib Itching  . Iodine   . Morphine And Related   . Tape    Code Status: DNR  Goals of Care: Advanced Directive information Advanced Directives 12/06/2015  Does patient have an advance directive? No  Type of Advance Directive -  Does patient want to make changes to advanced directive? -  Copy of advanced directive(s) in chart? -  Would patient like information on creating an advanced directive? Yes - Educational materials given   Has advanced directive needs it notarized   Chief Complaint  Patient presents with  . Medical Management of Chronic Issues    Follow up with MMSE. Failed  clock drawing.  . Other    daughter,Bonnie, in room with patient    HPI: Patient is a 75 y.o. female seen in today for an annual wellness exam.     Depression screen [facility] 2/9 12/06/2015 11/01/2015  Decreased Interest 0 0  Down, Depressed, Hopeless 0 0  PHQ - 2 Score 0 0   Currently with  situational depression- feels like she is coping appropriately   Fall Risk  12/06/2015 11/01/2015  Falls in the past year? No Yes  Number falls in past yr: - 2 or more  Injury with Fall? - Yes  Risk for fall due to : - Impaired balance/gait   MMSE - Mini Mental State Exam 12/06/2015  Orientation to time 5  Orientation to Place 4  Registration 3  Attention/ Calculation 5  Recall 3  Language- name 2 objects 2  Language- repeat 1  Language- follow 3 step command 3  Language- read & follow direction 1  Write a sentence 1  Copy design 0  Copy design-comments Patient has poor visual acuity.  Total score 28     Health Maintenance  Topic Date Due  . FOOT EXAM  10/31/1950  . MAMMOGRAM  10/31/1992  . COLONOSCOPY  10/31/1992  . ZOSTAVAX  11/01/2002  . DEXA SCAN  11/01/2007  . INFLUENZA VACCINE  10/25/2015  . HEMOGLOBIN A1C  05/31/2016  . TETANUS/TDAP  08/14/2016  . OPHTHALMOLOGY EXAM  10/10/2016  . PNA vac Low Risk Adult (2 of 2 - PPSV23) 10/31/2016    Urinary incontinence? Leak urine, wears pad  Functional Status Survey: Is the patient deaf or have difficulty hearing?: Yes (age related, does not wish to have hearing aid) Does the patient have difficulty seeing, even when wearing glasses/contacts?: Yes (has macular degeneration and follows with eye doctor) Does the patient have difficulty concentrating, remembering, or  making decisions?: Yes Does the patient have difficulty walking or climbing stairs?: No Does the patient have difficulty dressing or bathing?: No Does the patient have difficulty doing errands alone such as visiting a doctor's office or shopping?: Yes (due to vision can not drive) Current Exercise Habits: Home exercise routine, Type of exercise: walking, Time (Minutes): 30, Frequency (Times/Week): 7, Weekly Exercise (Minutes/Week): 210   Diet? Attempts diabetic   No exam data present   Dentition: yearly  Pain: chronic joint pain, taking tramadol which sometimes  has worse side effects with medication    Had diarrhea 2 weeks ago. Has cold sweats this has been going on for 1 week. Denies dysuria, shortness of breath, cough or congestion. No constipation or diarrhea at this time. Some nausea however this can be associated with pain medication. No abdominal pain.   Past Medical History:  Diagnosis Date  . Adjustment disorder 10/17/2005  . Atrial fibrillation ([place])   . Cataract    bilateral  . CHF (congestive heart failure) ([place])   . Contracture of knee joint 07/26/2009  . Coronary artery disease   . Degeneration of lumbar or lumbosacral intervertebral disc 07/26/2009  . Diabetes mellitus without complication ([place])   . Esophageal spasm   . Esophageal spasm   . Fibromyalgia   . Fibromyalgia   . Hearing loss 12/18/2010  . Heart murmur   . Hypercholesteremia   . Hypertension   . 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 Leaky heart valve   . Lyme disease   . Macular degeneration   . Macular degeneration of both eyes   . Migraine 10/17/2005  . Mixed incontinence 10/17/2005  . Osteoarthritis    multiple joints   . Overweight 09/19/2010  . Pneumonia   . Postartificial menopausal syndrome 10/10/2009  . Prinzmetal angina ([place]) 10/17/2005  . Psoriasis 06/12/2006  . Sciatica   . Type II diabetes mellitus ([place])   . [name] 10/17/2005    Past Surgical History:  Procedure Laterality Date  . AORTIC VALVE REPLACEMENT  12/10/2013  . CHOLECYSTECTOMY    . CORONARY ARTERY BYPASS GRAFT    . esophageal repair surgery    . JOINT REPLACEMENT    . MITRAL VALVE REPAIR    . REPLACEMENT TOTAL KNEE BILATERAL      Social History   Social History  . Marital status: Widowed    Spouse name: N/A  . Number of children: N/A  . Years of education: N/A   Social History Main Topics  . Smoking status: Former Smoker    Packs/day: 1.50    Years: 15.00    Types: Cigarettes  . Smokeless tobacco: Never Used  . Alcohol use No  . Drug  use: No  . Sexual activity: No   Other Topics Concern  . None   Social History Narrative  . None    Family History  Problem Relation Age of Onset  . Stroke Mother   . Heart failure Mother   . Heart disease Mother   . Diabetes Father   . Heart attack Father   . Stroke Father   . Heart disease Father     Review of Systems:  Review of Systems  Constitutional: Positive for unexpected weight change (weight gain). Negative for activity change, appetite change and fatigue.  HENT: Positive for hearing loss. Negative for congestion.   Eyes: Positive for visual disturbance.       Hx of macular degeneration and cataracts  Respiratory: Positive for  shortness of breath (with exertion). Negative for cough.   Cardiovascular: Positive for palpitations (from a fib). Negative for chest pain and leg swelling.  Gastrointestinal: Positive for nausea (occasionally). Negative for abdominal pain, constipation and diarrhea.       Incontinence of bowel   Genitourinary: Positive for urgency. Negative for difficulty urinating and dysuria.       Incontinent of bladder   Musculoskeletal: Positive for arthralgias, gait problem (off balance, 3 in last year), joint swelling and myalgias.  Skin: Negative for color change and wound.  Allergic/Immunologic: Positive for environmental allergies.  Neurological: Positive for dizziness (minior, rare). Negative for weakness.  Hematological: Bruises/bleeds easily (on coumadin ).  Psychiatric/Behavioral: Positive for confusion and sleep disturbance. Negative for agitation and behavioral problems.       Medication List       Accurate as of 12/06/15 11:18 AM. Always use your most recent med list.          acetaminophen 500 MG tablet Commonly known as:  TYLENOL Take 1,000 mg by mouth every 6 (six) hours as needed.   aspirin EC 81 MG tablet Take 81 mg by mouth every morning. 8 am   atorvastatin 80 MG tablet Commonly known as:  LIPITOR Take 1 tablet (80 mg  total) by mouth daily.   furosemide 20 MG tablet Commonly known as:  LASIX Take 20 mg by mouth every morning. 8 am   metFORMIN 500 MG tablet Commonly known as:  GLUCOPHAGE Take 500 mg by mouth 2 (two) times daily with a meal. 8am and 7pm   metoprolol tartrate 25 MG tablet Commonly known as:  LOPRESSOR Take 12.5 mg by mouth 2 (two) times daily. 8 am & 7 pm   potassium chloride 10 MEQ tablet Commonly known as:  K-DUR Take 10 mEq by mouth every morning. 8 am   PRESERVISION AREDS 2 PO Take 1 capsule by mouth 2 (two) times daily. 8 am and 7 pm   tobramycin 0.3 % ophthalmic solution Commonly known as:  TOBREX Use 1 drop in both eyes four times daily 2 days before eye injections.   traMADol 50 MG tablet Commonly known as:  ULTRAM 1-2 tablets every 6 hours as needed for pain, Max of 6 tablets in 24 hours   valsartan 320 MG tablet Commonly known as:  DIOVAN Take 1 tablet (320 mg total) by mouth daily.   warfarin 10 MG tablet Commonly known as:  COUMADIN Take 5 mg by mouth daily.   warfarin 2 MG tablet Commonly known as:  COUMADIN Take a half tablet (1mg ) by mouth every day.         Physical Exam: Vitals:   12/06/15 1055  BP: 138/76  Pulse: (!) 57  Resp: 18  Temp: 98.2 F (36.8 C)  TempSrc: Oral  SpO2: 96%  Weight: 151 lb 9.6 oz (68.8 kg)  Height: 5\' 3"  (1.6 m)   Body mass index is 26.85 kg/m. Physical Exam  Constitutional: She is oriented to person, place, and time. She appears well-developed and well-nourished. No distress.  HENT:  Head: Normocephalic and atraumatic.  Right Ear: External ear normal.  Left Ear: External ear normal.  Nose: Nose normal.  Mouth/Throat: Oropharynx is clear and moist. No oropharyngeal exudate.  Eyes: Conjunctivae and EOM are normal. Pupils are equal, round, and reactive to light.  Neck: Normal range of motion. Neck supple.  Cardiovascular: Normal rate, regular rhythm and intact distal pulses.   Murmur heard. Pulmonary/Chest:  Effort normal and breath  sounds normal.  Abdominal: Soft. Bowel sounds are normal. She exhibits no distension. There is no tenderness.  Musculoskeletal: Normal range of motion. She exhibits no edema or tenderness.  Neurological: She is alert and oriented to person, place, and time. She has normal reflexes.  Skin: Skin is warm and dry. She is not diaphoretic.  Psychiatric: She has a normal mood and affect.    Labs reviewed: Basic Metabolic Panel:  Recent Labs  05/02/15 1435 06/20/15 12/02/15 0933  NA 141 142 141  K 3.6 3.4 4.2  CL 104  --  102  CO2 25  --  28  GLUCOSE 155*  --  115*  BUN 24* 14 20  CREATININE 0.99 0.8 0.77  CALCIUM 9.2  --  9.7  TSH  --   --  0.70   Liver Function Tests:  Recent Labs  05/02/15 1435 06/20/15 12/02/15 0933  AST 23 14 21   ALT 16 14 19   ALKPHOS 97 106 93  BILITOT 0.4  --  0.6  PROT 7.0  --  7.2  ALBUMIN 3.9  --  4.2   No results for input(s): LIPASE, AMYLASE in the last 8760 hours. No results for input(s): AMMONIA in the last 8760 hours. CBC:  Recent Labs  05/02/15 1435 06/20/15 12/02/15 0933  WBC 9.2 6.9 11.9*  NEUTROABS 6.1  --  9,163*  HGB 12.4 12.7 13.1  HCT 37.5 39 39.2  MCV 84.7  --  82.9  PLT 353 353 374   Lipid Panel:  Recent Labs  05/03/15 0537 06/20/15  CHOL 161 149  HDL 52 52  LDLCALC 81 69  TRIG 142 141  CHOLHDL 3.1  --    Lab Results  Component Value Date   HGBA1C 6.9 (H) 12/02/2015    Procedures: No results found.  Assessment/Plan 1. Hx of TIA (transient ischemic attack) and stroke -now with expressive aphagia and memory deficit  - Ambulatory referral to [facility]  2. Type 2 diabetes mellitus with complication, without long-term current use of insulin (HCC) -A1c at goal, cont lifestyle modifications  - metFORMIN (GLUCOPHAGE) 500 MG tablet; Take 1 tablet (500 mg total) by mouth 2 (two) times daily with a meal. 8am and 7pm  Dispense: 180 tablet; Refill: 3  3. Essential hypertension Blood  pressure controlled on current regimen. Cont Valsartan and  metoprolol  4. Osteoarthritis, unspecified osteoarthritis type, unspecified site Chronic pain due to OA and fibromyalgia, conts on tramadol and tylenol PRN   5. Expressive aphasia Noted after TIA Will get ST for evaluation and thearpy  6. Paroxysmal atrial fibrillation (HCC) Rate controlled, cont to follow with coumadin clinic   Follow up in 3 months, sooner if needed

## 2015-12-13 ENCOUNTER — Ambulatory Visit (INDEPENDENT_AMBULATORY_CARE_PROVIDER_SITE_OTHER): Payer: Medicare Other | Admitting: *Deleted

## 2015-12-13 ENCOUNTER — Telehealth: Payer: Self-pay | Admitting: Nurse Practitioner

## 2015-12-13 DIAGNOSIS — Z954 Presence of other heart-valve replacement: Secondary | ICD-10-CM | POA: Diagnosis not present

## 2015-12-13 DIAGNOSIS — I4891 Unspecified atrial fibrillation: Secondary | ICD-10-CM | POA: Diagnosis not present

## 2015-12-13 DIAGNOSIS — Z5181 Encounter for therapeutic drug level monitoring: Secondary | ICD-10-CM | POA: Diagnosis not present

## 2015-12-13 LAB — POCT INR: INR: 2.2

## 2015-12-13 NOTE — Telephone Encounter (Signed)
FYI, I received an e-mail from Gerda Diss at [facility] in regards to the [facility] referral entered for patient, patient was not admitted to [facility] as she is not home bound

## 2015-12-14 NOTE — Telephone Encounter (Signed)
We can if a new referral is placed that is not for Home Health

## 2015-12-14 NOTE — Telephone Encounter (Signed)
Can we get outpatient ST referral due to hx of TIA and memory loss

## 2015-12-26 ENCOUNTER — Encounter: Payer: Self-pay | Admitting: Internal Medicine

## 2015-12-26 ENCOUNTER — Telehealth: Payer: Self-pay | Admitting: *Deleted

## 2015-12-26 ENCOUNTER — Other Ambulatory Visit: Payer: Self-pay | Admitting: Internal Medicine

## 2015-12-26 ENCOUNTER — Ambulatory Visit (INDEPENDENT_AMBULATORY_CARE_PROVIDER_SITE_OTHER): Payer: Medicare Other | Admitting: Internal Medicine

## 2015-12-26 VITALS — BP 110/70 | HR 74 | Temp 97.8°F | Wt 147.0 lb

## 2015-12-26 DIAGNOSIS — I10 Essential (primary) hypertension: Secondary | ICD-10-CM

## 2015-12-26 DIAGNOSIS — R739 Hyperglycemia, unspecified: Secondary | ICD-10-CM

## 2015-12-26 DIAGNOSIS — R42 Dizziness and giddiness: Secondary | ICD-10-CM | POA: Diagnosis not present

## 2015-12-26 DIAGNOSIS — I2510 Atherosclerotic heart disease of native coronary artery without angina pectoris: Secondary | ICD-10-CM

## 2015-12-26 DIAGNOSIS — E118 Type 2 diabetes mellitus with unspecified complications: Secondary | ICD-10-CM

## 2015-12-26 DIAGNOSIS — R748 Abnormal levels of other serum enzymes: Secondary | ICD-10-CM | POA: Diagnosis not present

## 2015-12-26 DIAGNOSIS — I480 Paroxysmal atrial fibrillation: Secondary | ICD-10-CM | POA: Diagnosis not present

## 2015-12-26 LAB — BASIC METABOLIC PANEL
BUN: 28 mg/dL — ABNORMAL HIGH (ref 7–25)
CO2: 22 mmol/L (ref 20–31)
Calcium: 9.3 mg/dL (ref 8.6–10.4)
Chloride: 101 mmol/L (ref 98–110)
Creat: 1.53 mg/dL — ABNORMAL HIGH (ref 0.60–0.93)
Glucose, Bld: 94 mg/dL (ref 65–99)
Potassium: 4 mmol/L (ref 3.5–5.3)
Sodium: 139 mmol/L (ref 135–146)

## 2015-12-26 LAB — LIPASE: Lipase: 99 U/L — ABNORMAL HIGH (ref 7–60)

## 2015-12-26 LAB — CBC WITH DIFFERENTIAL/PLATELET
Basophils Absolute: 0 cells/uL (ref 0–200)
Basophils Relative: 0 %
Eosinophils Absolute: 228 cells/uL (ref 15–500)
Eosinophils Relative: 2 %
HCT: 39 % (ref 35.0–45.0)
Hemoglobin: 13 g/dL (ref 11.7–15.5)
Lymphocytes Relative: 18 %
Lymphs Abs: 2052 cells/uL (ref 850–3900)
MCH: 27.8 pg (ref 27.0–33.0)
MCHC: 33.3 g/dL (ref 32.0–36.0)
MCV: 83.3 fL (ref 80.0–100.0)
MPV: 10.1 fL (ref 7.5–12.5)
Monocytes Absolute: 912 cells/uL (ref 200–950)
Monocytes Relative: 8 %
Neutro Abs: 8208 cells/uL — ABNORMAL HIGH (ref 1500–7800)
Neutrophils Relative %: 72 %
Platelets: 433 10*3/uL — ABNORMAL HIGH (ref 140–400)
RBC: 4.68 MIL/uL (ref 3.80–5.10)
RDW: 15.3 % — ABNORMAL HIGH (ref 11.0–15.0)
WBC: 11.4 10*3/uL — ABNORMAL HIGH (ref 3.8–10.8)

## 2015-12-26 LAB — GLUCOSE, POCT (MANUAL RESULT ENTRY): POC Glucose: 102 mg/dl — AB (ref 70–99)

## 2015-12-26 LAB — AMYLASE: Amylase: 72 U/L (ref 0–105)

## 2015-12-26 NOTE — Progress Notes (Signed)
Location:  [facility] clinic Provider: Kloee Ballew L. Mariea Clonts, D.O., C.M.D. PCP:  Sherrie Mustache, NP  Goals of Care:  Advanced Directives 12/06/2015  Does patient have an advance directive? No  Type of Advance Directive -  Does patient want to make changes to advanced directive? -  Copy of advanced directive(s) in chart? -  Would patient like information on creating an advanced directive? Yes - Geneticist, molecular Complaint  Patient presents with  . Acute Visit    blood sugar running high    HPI: Patient is a 75 y.o. female seen today for an acute visit for high blood sugar early this am.  First thing this am, sugar was "well over 200".  She had her morning meds and an hour later it was 206.  She ate some breakfast, she moved around a little.  Had plenty of fluids, did continue to trend down.  Did have two small bottles of coke yesterday (this is normal for her).  She had pork tenderloin, 1/2 cup of rice and vegetable.   CBG currently is 102, but last ate at noontime, now 4:23pm. Was lightheaded and like she had weights on her and was feeling sweaty and cold.  Stomach was hurting.   Has been having some loose stools.  Does take metformin with food.  Heat was pounding.  It was very bothersome this am, but it cleared up quickly.   Knows she does not drink as much water as she should--3-4 8oz glasses of water per day.   Numbness of her right side was transient.  No weakness or tingling.  Speech was normal.  Vision was unchanged--has macular degeneration.   No sick contacts.  Does say the loose stools are more the past few days.  No vomiting.    Pt's records have not yet been formally scanned into chart.  Past Medical History:  Diagnosis Date  . Adjustment disorder 10/17/2005  . Atrial fibrillation ([place])   . Cataract    bilateral  . CHF (congestive heart failure) ([place])   . Contracture of knee joint 07/26/2009  . Coronary artery disease   . Degeneration of lumbar or lumbosacral  intervertebral disc 07/26/2009  . Diabetes mellitus without complication ([place])   . Esophageal spasm   . Esophageal spasm   . Fibromyalgia   . Fibromyalgia   . Hearing loss 12/18/2010  . Heart murmur   . Hypercholesteremia   . Hypertension   . 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 Leaky heart valve   . Lyme disease   . Macular degeneration   . Macular degeneration of both eyes   . Migraine 10/17/2005  . Mixed incontinence 10/17/2005  . Osteoarthritis    multiple joints   . Overweight 09/19/2010  . Pneumonia   . Postartificial menopausal syndrome 10/10/2009  . Prinzmetal angina ([place]) 10/17/2005  . Psoriasis 06/12/2006  . Sciatica   . Type II diabetes mellitus ([place])   . [name] 10/17/2005    Past Surgical History:  Procedure Laterality Date  . AORTIC VALVE REPLACEMENT  12/10/2013  . CHOLECYSTECTOMY    . CORONARY ARTERY BYPASS GRAFT    . esophageal repair surgery    . JOINT REPLACEMENT    . MITRAL VALVE REPAIR    . REPLACEMENT TOTAL KNEE BILATERAL      Allergies  Allergen Reactions  . Penicillins Anaphylaxis    Has patient had a PCN reaction causing immediate rash, facial/tongue/throat swelling, SOB or lightheadedness with  hypotension: Yes Has patient had a PCN reaction causing severe rash involving mucus membranes or skin necrosis: Yes Has patient had a PCN reaction that required hospitalization Yes Has patient had a PCN reaction occurring within the last 10 years: No If all of the above answers are "NO", then may proceed with Cephalosporin use.   . Latex   . Celecoxib Itching  . Iodine   . Morphine And Related   . Tape       Medication List       Accurate as of 12/26/15  4:23 PM. Always use your most recent med list.          acetaminophen 500 MG tablet Commonly known as:  TYLENOL Take 1,000 mg by mouth every 6 (six) hours as needed.   aspirin EC 81 MG tablet Take 81 mg by mouth every morning. 8 am   atorvastatin 80 MG  tablet Commonly known as:  LIPITOR Take 1 tablet (80 mg total) by mouth daily.   furosemide 20 MG tablet Commonly known as:  LASIX Take 20 mg by mouth every morning. 8 am   metFORMIN 500 MG tablet Commonly known as:  GLUCOPHAGE Take 1 tablet (500 mg total) by mouth 2 (two) times daily with a meal. 8am and 7pm   metoprolol tartrate 25 MG tablet Commonly known as:  LOPRESSOR Take 12.5 mg by mouth 2 (two) times daily. 8 am & 7 pm   potassium chloride 10 MEQ tablet Commonly known as:  K-DUR Take 10 mEq by mouth every morning. 8 am   PRESERVISION AREDS 2 PO Take 1 capsule by mouth 2 (two) times daily. 8 am and 7 pm   tobramycin 0.3 % ophthalmic solution Commonly known as:  TOBREX Use 1 drop in both eyes four times daily 2 days before eye injections.   traMADol 50 MG tablet Commonly known as:  ULTRAM 1-2 tablets every 6 hours as needed for pain, Max of 6 tablets in 24 hours   valsartan 320 MG tablet Commonly known as:  DIOVAN Take 1 tablet (320 mg total) by mouth daily.   warfarin 10 MG tablet Commonly known as:  COUMADIN Take 5 mg by mouth daily.   warfarin 2 MG tablet Commonly known as:  COUMADIN Take a half tablet (1mg ) by mouth every day.       Review of Systems:  Review of Systems  Constitutional: Positive for malaise/fatigue. Negative for chills and fever.  HENT: Negative for hearing loss.   Eyes: Negative for blurred vision.  Respiratory: Negative for cough and shortness of breath.   Cardiovascular: Positive for palpitations. Negative for chest pain, orthopnea, claudication, leg swelling and PND.  Gastrointestinal: Positive for abdominal pain, diarrhea and nausea. Negative for blood in stool, constipation, heartburn, melena and vomiting.  Genitourinary: Negative for dysuria, frequency and urgency.  Musculoskeletal: Negative for falls and joint pain.  Skin: Negative for itching and rash.  Neurological: Positive for sensory change and weakness. Negative for  dizziness, focal weakness, seizures, loss of consciousness and headaches.       Transient on her right side before, not now  Endo/Heme/Allergies: Bruises/bleeds easily.  Psychiatric/Behavioral: Negative for depression. The patient does not have insomnia.     Health Maintenance  Topic Date Due  . MAMMOGRAM  10/31/1992  . ZOSTAVAX  11/01/2002  . DEXA SCAN  11/01/2007  . HEMOGLOBIN A1C  05/31/2016  . TETANUS/TDAP  08/14/2016  . OPHTHALMOLOGY EXAM  10/10/2016  . PNA vac Low Risk Adult (2  of 2 - PPSV23) 10/31/2016  . FOOT EXAM  12/05/2016  . COLONOSCOPY  03/26/2022  . INFLUENZA VACCINE  Completed    Physical Exam: Vitals:   12/26/15 1547  BP: 110/70  Pulse: 74  Temp: 97.8 F (36.6 C)  TempSrc: Oral  SpO2: 98%  Weight: 147 lb (66.7 kg)   Body mass index is 26.04 kg/m. Physical Exam  Constitutional: She is oriented to person, place, and time. She appears well-nourished. No distress.  Lying down on exam table due to dizziness, nausea; hygiene poor today with food or bodily fluids on clothes  HENT:  Head: Normocephalic and atraumatic.  Cardiovascular: Normal rate, regular rhythm, normal heart sounds and intact distal pulses.   Pulmonary/Chest: Effort normal and breath sounds normal.  Abdominal: Soft. Bowel sounds are normal. She exhibits no distension and no mass. There is tenderness. There is no rebound and no guarding. No hernia.  Upper abdomen and around to her back  Musculoskeletal: Normal range of motion. She exhibits no tenderness.  Neurological: She is alert and oriented to person, place, and time.  lethargic  Skin: Skin is warm and dry. Capillary refill takes less than 2 seconds.  Psychiatric: She has a normal mood and affect.    Labs reviewed: Basic Metabolic Panel:  Recent Labs  05/02/15 1435 06/20/15 12/02/15 0933  NA 141 142 141  K 3.6 3.4 4.2  CL 104  --  102  CO2 25  --  28  GLUCOSE 155*  --  115*  BUN 24* 14 20  CREATININE 0.99 0.8 0.77  CALCIUM  9.2  --  9.7  TSH  --   --  0.70   Liver Function Tests:  Recent Labs  05/02/15 1435 06/20/15 12/02/15 0933  AST 23 14 21   ALT 16 14 19   ALKPHOS 97 106 93  BILITOT 0.4  --  0.6  PROT 7.0  --  7.2  ALBUMIN 3.9  --  4.2   No results for input(s): LIPASE, AMYLASE in the last 8760 hours. No results for input(s): AMMONIA in the last 8760 hours. CBC:  Recent Labs  05/02/15 1435 06/20/15 12/02/15 0933  WBC 9.2 6.9 11.9*  NEUTROABS 6.1  --  9,163*  HGB 12.4 12.7 13.1  HCT 37.5 39 39.2  MCV 84.7  --  82.9  PLT 353 353 374   Lipid Panel:  Recent Labs  05/03/15 0537 06/20/15  CHOL 161 149  HDL 52 52  LDLCALC 81 69  TRIG 142 141  CHOLHDL 3.1  --    Lab Results  Component Value Date   HGBA1C 6.9 (H) 12/02/2015    Assessment/Plan 1. Hyperglycemia -seems mild with max sugars in 200s, but due to her other symptoms of dizziness, nausea and diarrhea, will check labs: - CBC with Differential/Platelet - Basic metabolic panel - Lipase - Amylase -encouraged hydration and rest pending results -HR not tachy and no chest pain or dyspnea  2. Dizziness -suspect due to dehydration , not in afib with RVR--rate controlled  3. Type 2 diabetes mellitus with complication, without long-term current use of insulin (HCC) - well controlled typically and now with cbgs in 200s so suspect infection vs. Pancreatitis vs ascending cholangitis (pain should be worse and should be febrile for it though) - POC Glucose (CBG) normal for her here at 102 - CBC with Differential/Platelet - Basic metabolic panel - Lipase - Amylase  4. Essential hypertension -bp at goal, no changes needed - CBC with Differential/Platelet - Basic  metabolic panel  5. Paroxysmal atrial fibrillation (HCC) - rate controlled, hydrate to prevent RVR - CBC with Differential/Platelet - Basic metabolic panel  Labs/tests ordered:   Orders Placed This Encounter  Procedures  . CBC with Differential/Platelet  . Basic  metabolic panel  . Lipase  . Amylase  . POC Glucose (CBG)    Next appt:  04/13/2016 with Janett Billow as scheduled  Sal Spratley L. Teylor Wolven, D.O. [facility] [address], [place] [address] Cell Phone (Mon-Fri 8am-5pm):  [phone] On Call:  [phone] & follow prompts after 5pm & weekends Office Phone:  [phone] Office Fax:  [phone]

## 2015-12-26 NOTE — Telephone Encounter (Signed)
Patient daughter, Jeanette Yates called and stated that patient's blood sugar was running high and was not feeling well. Appointment scheduled with Dr. Mariea Clonts today to be evaluated.

## 2015-12-27 ENCOUNTER — Other Ambulatory Visit: Payer: Self-pay | Admitting: *Deleted

## 2015-12-27 MED ORDER — GLUCOSE BLOOD VI STRP: ORAL_STRIP | 12 refills | Status: DC

## 2015-12-27 MED ORDER — ACCU-CHEK AVIVA DEVI: 0 refills | Status: DC

## 2015-12-27 NOTE — Telephone Encounter (Signed)
Patient daughter called and stated that [facility] needed to be called in due to insurance not covering [facility]

## 2015-12-27 NOTE — Telephone Encounter (Signed)
Jeanette Yates, daughter requested to be sent to [facility] because they are out.

## 2015-12-28 ENCOUNTER — Other Ambulatory Visit: Payer: Self-pay | Admitting: *Deleted

## 2015-12-28 MED ORDER — FORACARE PREMIUM V10 DEVI: 0 refills | Status: DC

## 2015-12-28 MED ORDER — TRUEDRAW LANCING DEVICE MISC: 0 refills | Status: DC

## 2015-12-28 MED ORDER — GLUCOSE BLOOD VI STRP: ORAL_STRIP | 12 refills | Status: DC

## 2015-12-28 NOTE — Telephone Encounter (Signed)
Received fax from [facility] [phone] regarding DM testing supplies for [facility] meter and strips. Filled out and placed for Jessica to review and sign.

## 2015-12-29 ENCOUNTER — Telehealth: Payer: Self-pay | Admitting: Cardiology

## 2015-12-29 LAB — HEPATIC FUNCTION PANEL
ALT: 18 U/L (ref 6–29)
AST: 22 U/L (ref 10–35)
Albumin: 4 g/dL (ref 3.6–5.1)
Alkaline Phosphatase: 106 U/L (ref 33–130)
Bilirubin, Direct: 0.1 mg/dL (ref <=0.2)
Indirect Bilirubin: 0.3 mg/dL (ref 0.2–1.2)
Total Bilirubin: 0.4 mg/dL (ref 0.2–1.2)
Total Protein: 7 g/dL (ref 6.1–8.1)

## 2015-12-29 NOTE — Telephone Encounter (Signed)
Patients daughter would like a return call.

## 2016-01-02 ENCOUNTER — Ambulatory Visit (INDEPENDENT_AMBULATORY_CARE_PROVIDER_SITE_OTHER): Payer: Medicare Other | Admitting: *Deleted

## 2016-01-02 DIAGNOSIS — Z8673 Personal history of transient ischemic attack (TIA), and cerebral infarction without residual deficits: Secondary | ICD-10-CM

## 2016-01-02 DIAGNOSIS — Z952 Presence of prosthetic heart valve: Secondary | ICD-10-CM

## 2016-01-02 DIAGNOSIS — I4891 Unspecified atrial fibrillation: Secondary | ICD-10-CM

## 2016-01-02 DIAGNOSIS — Z5181 Encounter for therapeutic drug level monitoring: Secondary | ICD-10-CM | POA: Diagnosis not present

## 2016-01-02 LAB — POCT INR: INR: 2.2

## 2016-01-10 ENCOUNTER — Encounter: Payer: Self-pay | Admitting: *Deleted

## 2016-01-10 MED ORDER — WARFARIN SODIUM 6 MG PO TABS: 6.0000 mg | ORAL_TABLET | Freq: Every day | ORAL | 3 refills | Status: DC

## 2016-01-10 NOTE — Telephone Encounter (Signed)
Pt missed dose of coumadin over the weekend.  Told daughter to have pt take an extra coumadin 4mg  this morning and resume regular dose tonight.  Daughter would like to start giving coumadin in the morning because her mother remembers to take her morning meds better than her night time meds.  Will start this after next INR check.  Also requested that coumadin 6mg  tablet be sent to [facility] and this was done.

## 2016-01-10 NOTE — Telephone Encounter (Signed)
-----   Message from Massie Maroon, [place] sent at 01/10/2016  8:33 AM EDT ----- Contact: [phone] Pt daughter says pt missed 1 dose of coumadin sometime over the weekend. Wants to know if pt needs to take extra dose.   Staci

## 2016-01-18 ENCOUNTER — Other Ambulatory Visit: Payer: Self-pay | Admitting: Cardiology

## 2016-01-26 ENCOUNTER — Ambulatory Visit (INDEPENDENT_AMBULATORY_CARE_PROVIDER_SITE_OTHER): Payer: Medicare Other | Admitting: *Deleted

## 2016-01-26 DIAGNOSIS — Z8673 Personal history of transient ischemic attack (TIA), and cerebral infarction without residual deficits: Secondary | ICD-10-CM | POA: Diagnosis not present

## 2016-01-26 DIAGNOSIS — Z5181 Encounter for therapeutic drug level monitoring: Secondary | ICD-10-CM | POA: Diagnosis not present

## 2016-01-26 DIAGNOSIS — Z952 Presence of prosthetic heart valve: Secondary | ICD-10-CM

## 2016-01-26 DIAGNOSIS — I4891 Unspecified atrial fibrillation: Secondary | ICD-10-CM

## 2016-01-26 LAB — POCT INR: INR: 2.6

## 2016-02-10 DIAGNOSIS — H353231 Exudative age-related macular degeneration, bilateral, with active choroidal neovascularization: Secondary | ICD-10-CM | POA: Diagnosis not present

## 2016-02-10 DIAGNOSIS — H43813 Vitreous degeneration, bilateral: Secondary | ICD-10-CM | POA: Diagnosis not present

## 2016-02-10 DIAGNOSIS — H53413 Scotoma involving central area, bilateral: Secondary | ICD-10-CM | POA: Diagnosis not present

## 2016-03-08 ENCOUNTER — Ambulatory Visit (INDEPENDENT_AMBULATORY_CARE_PROVIDER_SITE_OTHER): Payer: Medicare Other | Admitting: *Deleted

## 2016-03-08 DIAGNOSIS — Z8673 Personal history of transient ischemic attack (TIA), and cerebral infarction without residual deficits: Secondary | ICD-10-CM

## 2016-03-08 DIAGNOSIS — I2510 Atherosclerotic heart disease of native coronary artery without angina pectoris: Secondary | ICD-10-CM

## 2016-03-08 DIAGNOSIS — I4891 Unspecified atrial fibrillation: Secondary | ICD-10-CM

## 2016-03-08 DIAGNOSIS — Z952 Presence of prosthetic heart valve: Secondary | ICD-10-CM | POA: Diagnosis not present

## 2016-03-08 DIAGNOSIS — Z5181 Encounter for therapeutic drug level monitoring: Secondary | ICD-10-CM | POA: Diagnosis not present

## 2016-03-08 LAB — POCT INR: INR: 1.9

## 2016-04-13 ENCOUNTER — Encounter: Payer: Self-pay | Admitting: Internal Medicine

## 2016-04-13 ENCOUNTER — Ambulatory Visit (INDEPENDENT_AMBULATORY_CARE_PROVIDER_SITE_OTHER): Payer: Medicare Other | Admitting: Internal Medicine

## 2016-04-13 VITALS — BP 158/70 | HR 47 | Temp 98.3°F | Wt 156.0 lb

## 2016-04-13 DIAGNOSIS — I480 Paroxysmal atrial fibrillation: Secondary | ICD-10-CM | POA: Diagnosis not present

## 2016-04-13 DIAGNOSIS — E118 Type 2 diabetes mellitus with unspecified complications: Secondary | ICD-10-CM | POA: Diagnosis not present

## 2016-04-13 DIAGNOSIS — Z952 Presence of prosthetic heart valve: Secondary | ICD-10-CM | POA: Diagnosis not present

## 2016-04-13 DIAGNOSIS — L821 Other seborrheic keratosis: Secondary | ICD-10-CM | POA: Diagnosis not present

## 2016-04-13 DIAGNOSIS — I10 Essential (primary) hypertension: Secondary | ICD-10-CM

## 2016-04-13 DIAGNOSIS — Z6827 Body mass index (BMI) 27.0-27.9, adult: Secondary | ICD-10-CM | POA: Diagnosis not present

## 2016-04-13 DIAGNOSIS — I509 Heart failure, unspecified: Secondary | ICD-10-CM | POA: Diagnosis not present

## 2016-04-13 LAB — COMPLETE METABOLIC PANEL WITH GFR
ALT: 16 U/L (ref 6–29)
AST: 20 U/L (ref 10–35)
Albumin: 4.2 g/dL (ref 3.6–5.1)
Alkaline Phosphatase: 91 U/L (ref 33–130)
BUN: 23 mg/dL (ref 7–25)
CO2: 31 mmol/L (ref 20–31)
Calcium: 9.7 mg/dL (ref 8.6–10.4)
Chloride: 102 mmol/L (ref 98–110)
Creat: 0.88 mg/dL (ref 0.60–0.93)
GFR, Est African American: 75 mL/min (ref >=60)
GFR, Est Non African American: 65 mL/min (ref >=60)
Glucose, Bld: 85 mg/dL (ref 65–99)
Potassium: 4.6 mmol/L (ref 3.5–5.3)
Sodium: 139 mmol/L (ref 135–146)
Total Bilirubin: 0.5 mg/dL (ref 0.2–1.2)
Total Protein: 7 g/dL (ref 6.1–8.1)

## 2016-04-13 LAB — HEMOGLOBIN A1C
Hgb A1c MFr Bld: 6.9 % — ABNORMAL HIGH (ref <5.7)
Mean Plasma Glucose: 151 mg/dL

## 2016-04-13 LAB — CBC WITH DIFFERENTIAL/PLATELET
Basophils Absolute: 93 cells/uL (ref 0–200)
Basophils Relative: 1 %
Eosinophils Absolute: 279 cells/uL (ref 15–500)
Eosinophils Relative: 3 %
HCT: 39.5 % (ref 35.0–45.0)
Hemoglobin: 12.6 g/dL (ref 11.7–15.5)
Lymphocytes Relative: 21 %
Lymphs Abs: 1953 cells/uL (ref 850–3900)
MCH: 27.2 pg (ref 27.0–33.0)
MCHC: 31.9 g/dL — ABNORMAL LOW (ref 32.0–36.0)
MCV: 85.1 fL (ref 80.0–100.0)
MPV: 9.4 fL (ref 7.5–12.5)
Monocytes Absolute: 744 cells/uL (ref 200–950)
Monocytes Relative: 8 %
Neutro Abs: 6231 cells/uL (ref 1500–7800)
Neutrophils Relative %: 67 %
Platelets: 409 10*3/uL — ABNORMAL HIGH (ref 140–400)
RBC: 4.64 MIL/uL (ref 3.80–5.10)
RDW: 13.7 % (ref 11.0–15.0)
WBC: 9.3 10*3/uL (ref 3.8–10.8)

## 2016-04-13 NOTE — Patient Instructions (Signed)
If metformin am dose missed and it's not near mealtime, wait until the evening dose.   Don't take extra metformin for higher sugars, just avoid triggers.

## 2016-04-13 NOTE — Progress Notes (Signed)
Location:  [facility] clinic Provider:  Aanya Haynes L. Mariea Clonts, D.O., C.M.D.  Code Status: full code Goals of Care:  Advanced Directives 04/13/2016  Does Patient Have a Medical Advance Directive? No  Type of Advance Directive -  Does patient want to make changes to medical advance directive? -  Copy of [place] in Chart? -  Would patient like information on creating a medical advance directive? Yes (MAU/Ambulatory/Procedural Areas - Information given)   Chief Complaint  Patient presents with  . Medical Management of Chronic Issues    3 mth follow-up    HPI: Patient is a 76 y.o. female with h/o DMII, TIA, OA, HTN, fibromyalgia, esophageal spasm, CHF, afib and h/o AVR on coumadin seen today for medical management of chronic diseases.  Last INR 1.9 at cardiology on 12/14.  Dose up for that day, then 6mg  daily after that.  Before that 2.6 and 2.2.  Low was felt to be diet.  Returns 1/23.    She has not seen Janett Billow at all since Oct '17 when she saw me acutely for hyperglycemia.  I thought she had her appt with her this month, but she is seeing me today.  DMII:  Sugars have been much better.  Over the holidays, there were some med errors.  Caught early fortunately.  Her daughter put together a med checklist for her.  She has not bothered to do it, it's not that she is forgetting, she says.  She worries that she is picking up her husband's hypochondriac tendencies if she does check daily.  Her daughter asks if her metformin is missed in the am, should she take it later if caught at 3pm.  Last hba1c in Sept last year was 6.9.    HTN:  Unclear if today's bp is representative of others she's had.  She has not checked weekly as planned.    No sob, swelling of ankles.  Still get pain in her chest where she had her heart surgery for the AVR.  It's intermittent--no connection to activity.  If she lays down, takes a deep breath, it hurts so she avoids those.  Does not even occur weekly.  Does  not last all day, not even an hour.  Discussed aleve or ibuprofen if lasts, but also to report.    She just enrolled in the [employer], silver sneakers.  She did that and a little on the treadmill.  Likes to do some weights also.  She wants to do some yoga.  She walks a lot anyway.    Encouraged water aerobics for fibromyalgia. Wants to get rid of a few extra lbs.    Discussed avoiding tylenol pm and aleve pm.  Occasionally naps in the afternoon.    Feels the tramadol makes her dizzy, nauseous.  She takes it when she hurts at night in her shoulders or neck.  She had been using the advil and tylenol pms for pain and they didn't help much.  She used to take fioricet for headaches.  Now costly and controlled.  Discussed use of heat.    Right cheek with a growing papule on it at the area where her glasses hit it.  Wants removed.    Got a letter from [employer] [facility] at [facility] in [place] showing that a device used to heat and cool blood was contaminated with a mycobacterium and patients could experience night sweats, muscle aches, fatigue, wt loss and unexplained fever.  Will be scanned for our records  Due for cscope.  No significant fhx.  She does not recall any polyps either.  Thinks the last one was 2015 but not sure.  Done at [facility] in [place], [place].    Past Medical History:  Diagnosis Date  . Adjustment disorder 10/17/2005  . Atrial fibrillation ([place])   . Cataract    bilateral  . CHF (congestive heart failure) ([place])   . Contracture of knee joint 07/26/2009  . Coronary artery disease   . Degeneration of lumbar or lumbosacral intervertebral disc 07/26/2009  . Diabetes mellitus without complication ([place])   . Esophageal spasm   . Esophageal spasm   . Fibromyalgia   . Fibromyalgia   . Hearing loss 12/18/2010  . Heart murmur   . Hypercholesteremia   . Hypertension   . 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 Leaky heart valve   .  Lyme disease   . Macular degeneration   . Macular degeneration of both eyes   . Migraine 10/17/2005  . Mixed incontinence 10/17/2005  . Osteoarthritis    multiple joints   . Overweight 09/19/2010  . Pneumonia   . Postartificial menopausal syndrome 10/10/2009  . Prinzmetal angina ([place]) 10/17/2005  . Psoriasis 06/12/2006  . Sciatica   . TIA (transient ischemic attack)   . Type II diabetes mellitus ([place])   . [name] 10/17/2005    Past Surgical History:  Procedure Laterality Date  . ABDOMINAL HYSTERECTOMY    . AORTIC VALVE REPLACEMENT  12/10/2013  . CHOLECYSTECTOMY    . CORONARY ARTERY BYPASS GRAFT    . esophageal repair surgery    . JOINT REPLACEMENT    . MITRAL VALVE REPAIR    . NISSEN FUNDOPLICATION    . REPLACEMENT TOTAL KNEE BILATERAL      Allergies  Allergen Reactions  . Penicillins Anaphylaxis    Has patient had a PCN reaction causing immediate rash, facial/tongue/throat swelling, SOB or lightheadedness with hypotension: Yes Has patient had a PCN reaction causing severe rash involving mucus membranes or skin necrosis: Yes Has patient had a PCN reaction that required hospitalization Yes Has patient had a PCN reaction occurring within the last 10 years: No If all of the above answers are "NO", then may proceed with Cephalosporin use.   . Latex   . Celecoxib Itching  . Iodine   . Morphine And Related   . Tape     Allergies as of 04/13/2016      Reactions   Penicillins Anaphylaxis   Has patient had a PCN reaction causing immediate rash, facial/tongue/throat swelling, SOB or lightheadedness with hypotension: Yes Has patient had a PCN reaction causing severe rash involving mucus membranes or skin necrosis: Yes Has patient had a PCN reaction that required hospitalization Yes Has patient had a PCN reaction occurring within the last 10 years: No If all of the above answers are "NO", then may proceed with Cephalosporin use.   Latex    Celecoxib Itching   Iodine     Morphine And Related    Tape       Medication List       Accurate as of 04/13/16 10:23 AM. Always use your most recent med list.          acetaminophen 500 MG tablet Commonly known as:  TYLENOL Take 1,000 mg by mouth every 6 (six) hours as needed.   aspirin EC 81 MG tablet Take 81 mg by mouth every morning. 8 am   atorvastatin  80 MG tablet Commonly known as:  LIPITOR Take 1 tablet (80 mg total) by mouth daily.   FORACARE PREMIUM V10 Devi Use to test blood sugar three times daily. Dx: E11.9   furosemide 20 MG tablet Commonly known as:  LASIX Take 20 mg by mouth every morning. 8 am   metFORMIN 500 MG tablet Commonly known as:  GLUCOPHAGE Take 1 tablet (500 mg total) by mouth 2 (two) times daily with a meal. 8am and 7pm   metoprolol tartrate 25 MG tablet Commonly known as:  LOPRESSOR Take 12.5 mg by mouth 2 (two) times daily. 8 am & 7 pm   potassium chloride 10 MEQ tablet Commonly known as:  K-DUR Take 10 mEq by mouth every morning. 8 am   PRESERVISION AREDS 2 PO Take 1 capsule by mouth daily.   traMADol 50 MG tablet Commonly known as:  ULTRAM 1-2 tablets every 6 hours as needed for pain, Max of 6 tablets in 24 hours   TRUEDRAW LANCING DEVICE Misc Use as Directed   valsartan 320 MG tablet Commonly known as:  DIOVAN Take 320 mg by mouth daily.   warfarin 6 MG tablet Commonly known as:  COUMADIN Take 1 tablet (6 mg total) by mouth daily.       Review of Systems:  Review of Systems  Constitutional: Negative for chills, fever and malaise/fatigue.       Wt gain  HENT: Negative for congestion and hearing loss.   Eyes: Negative for blurred vision.       Follows with Dr Baird Cancer and an optometrist  Respiratory: Negative for cough and shortness of breath.   Cardiovascular: Negative for chest pain, palpitations and leg swelling.  Gastrointestinal: Negative for abdominal pain, blood in stool, constipation, diarrhea and melena.  Genitourinary: Negative for  dysuria.  Musculoskeletal: Positive for joint pain and myalgias. Negative for falls.  Skin: Negative for rash.       Spot on right cheek  Neurological: Negative for dizziness, loss of consciousness and weakness.  Endo/Heme/Allergies:       Diabetes  Psychiatric/Behavioral: Negative for depression. The patient is not nervous/anxious.     Health Maintenance  Topic Date Due  . MAMMOGRAM  10/31/1992  . ZOSTAVAX  11/01/2002  . DEXA SCAN  11/01/2007  . HEMOGLOBIN A1C  05/31/2016  . TETANUS/TDAP  08/14/2016  . OPHTHALMOLOGY EXAM  10/10/2016  . PNA vac Low Risk Adult (2 of 2 - PPSV23) 10/31/2016  . FOOT EXAM  12/05/2016  . COLONOSCOPY  03/26/2022  . INFLUENZA VACCINE  Completed    Physical Exam: Vitals:   04/13/16 1013  BP: (!) 158/70  Pulse: (!) 47  Temp: 98.3 F (36.8 C)  TempSrc: Oral  SpO2: 96%  Weight: 156 lb (70.8 kg)   Body mass index is 27.63 kg/m. Physical Exam  Constitutional: She appears well-developed and well-nourished. No distress.  Cardiovascular: Normal rate, regular rhythm and intact distal pulses.   Murmur heard. Pulmonary/Chest: Effort normal and breath sounds normal. No respiratory distress. She has no rales.  Abdominal: Soft. Bowel sounds are normal. She exhibits no distension. There is no tenderness.  Musculoskeletal: Normal range of motion.  Neurological: She is alert.  Skin: Skin is warm and dry.  Right cheek where eyeglasses rest has raised, irregular papule  Psychiatric: She has a normal mood and affect.    Labs reviewed: Basic Metabolic Panel:  Recent Labs  05/02/15 1435 06/20/15 12/02/15 0933 12/26/15 1453  NA 141 142 141 139  K  3.6 3.4 4.2 4.0  CL 104  --  102 101  CO2 25  --  28 22  GLUCOSE 155*  --  115* 94  BUN 24* 14 20 28*  CREATININE 0.99 0.8 0.77 1.53*  CALCIUM 9.2  --  9.7 9.3  TSH  --   --  0.70  --    Liver Function Tests:  Recent Labs  05/02/15 1435 06/20/15 12/02/15 0933 12/26/15 1453  AST 23 14 21 22   ALT 16  14 19 18   ALKPHOS 97 106 93 106  BILITOT 0.4  --  0.6 0.4  PROT 7.0  --  7.2 7.0  ALBUMIN 3.9  --  4.2 4.0    Recent Labs  12/26/15 1453  LIPASE 99*  AMYLASE 72   No results for input(s): AMMONIA in the last 8760 hours. CBC:  Recent Labs  05/02/15 1435 06/20/15 12/02/15 0933 12/26/15 1453  WBC 9.2 6.9 11.9* 11.4*  NEUTROABS 6.1  --  9,163* 8,208*  HGB 12.4 12.7 13.1 13.0  HCT 37.5 39 39.2 39.0  MCV 84.7  --  82.9 83.3  PLT 353 353 374 433*   Lipid Panel:  Recent Labs  05/03/15 0537 06/20/15  CHOL 161 149  HDL 52 52  LDLCALC 81 69  TRIG 142 141  CHOLHDL 3.1  --    Lab Results  Component Value Date   HGBA1C 6.9 (H) 12/02/2015     Assessment/Plan 1. Type 2 diabetes mellitus with complication, without long-term current use of insulin ([place]) - advised to check sugar about 3x per week and write them down - cont metformin--important to take twice a day with meals - counseled that if she misses the morning dose and it's midafternoon before she realizes, to wait and take the evening dose to prevent taking on an empty stomach -also counseled on frequent small meals and regular exercise (she has started a program actually) - COMPLETE METABOLIC PANEL WITH GFR - CBC with Differential/Platelet - Hemoglobin A1c -cont arb, asa, statin  2. Paroxysmal atrial fibrillation (HCC) -HR controlled, in fact a little brady at check in -usually in 50s at visits -cont lopressor 12.5mg  po bid -cont baby asa and is on coumadin--has not had bleeding problems so monitor this  3. S/P AVR (aortic valve replacement) -follows with cardiology for INR monitoring and recently had to increase her dose for one day due to low  4. Congestive heart failure, unspecified congestive heart failure chronicity, unspecified congestive heart failure type ([place]) - no recent problems with rales, edema, sob - CBC with Differential/Platelet  5. Essential hypertension - bp at goal, cont ARB, beta  blocker - COMPLETE METABOLIC PANEL WITH GFR  6. Seborrheic keratosis - wants papule on face removed due to irritation with her glasses resting on it - Ambulatory referral to Dermatology  Labs/tests ordered:   Orders Placed This Encounter  Procedures  . COMPLETE METABOLIC PANEL WITH GFR    [facility]  . CBC with Differential/Platelet  . Hemoglobin A1c  . Ambulatory referral to Dermatology    Referral Priority:   Routine    Referral Type:   Consultation    Referral Reason:   Specialty Services Required    Requested Specialty:   Dermatology    Number of Visits Requested:   1   Next appt:  05/25/2016   Shasta Chinn L. Tequila Rottmann, D.O. [facility] [address], [place] [address] Cell Phone (Mon-Fri 8am-5pm):  [phone] On  Call:  [phone] & follow prompts after 5pm & weekends Office Phone:  [phone] Office Fax:  [phone]

## 2016-04-16 ENCOUNTER — Encounter: Payer: Self-pay | Admitting: *Deleted

## 2016-04-17 ENCOUNTER — Ambulatory Visit (INDEPENDENT_AMBULATORY_CARE_PROVIDER_SITE_OTHER): Payer: Medicare Other | Admitting: *Deleted

## 2016-04-17 DIAGNOSIS — Z8673 Personal history of transient ischemic attack (TIA), and cerebral infarction without residual deficits: Secondary | ICD-10-CM | POA: Diagnosis not present

## 2016-04-17 DIAGNOSIS — Z5181 Encounter for therapeutic drug level monitoring: Secondary | ICD-10-CM

## 2016-04-17 DIAGNOSIS — I4891 Unspecified atrial fibrillation: Secondary | ICD-10-CM

## 2016-04-17 DIAGNOSIS — Z952 Presence of prosthetic heart valve: Secondary | ICD-10-CM | POA: Diagnosis not present

## 2016-04-17 LAB — POCT INR: INR: 2.6

## 2016-05-03 ENCOUNTER — Telehealth: Payer: Self-pay | Admitting: Cardiology

## 2016-05-03 ENCOUNTER — Telehealth: Payer: Self-pay

## 2016-05-03 DIAGNOSIS — L820 Inflamed seborrheic keratosis: Secondary | ICD-10-CM | POA: Diagnosis not present

## 2016-05-03 MED ORDER — AZITHROMYCIN 500 MG PO TABS: ORAL_TABLET | ORAL | 0 refills | Status: DC

## 2016-05-03 NOTE — Telephone Encounter (Signed)
I recommend she take azithromycin 500mg  30 minutes prior to her dental procedure.  Aortic valve replacement is one of the remaining times when antibiotic prophylaxis is needed to prevent endocarditis.  Pt has penicillin allergy (cannot use ampicillin) and already has intermittent problems with diarrhea (so avoid clindamycin).  If cardiology has an alternative recommendation, I recommend they follow their recommendation.

## 2016-05-03 NOTE — Telephone Encounter (Signed)
Forward to provider

## 2016-05-03 NOTE — Telephone Encounter (Signed)
Pt made aware that she needs to be pre medicated.

## 2016-05-03 NOTE — Telephone Encounter (Signed)
Spoke with patient's daughter and informed her of Dr.Reed's recommendations. RX sent to pharmacy.   Patient's daughter requested that I fax phone note to patient's dentist @ [phone]  Faxed

## 2016-05-03 NOTE — Telephone Encounter (Signed)
Patient needs dental procedure is asking does she need to be premedicated prior to. / tg

## 2016-05-03 NOTE — Telephone Encounter (Signed)
Message left on clinical intake voicemail:   Patient will have a dental procedure tomorrow at 8:00 am and her daughter is questioning if she needs to be premedicate  Side Note: Patient's also contacted her Cardiologist (see phone note in [place])

## 2016-05-11 ENCOUNTER — Other Ambulatory Visit: Payer: Self-pay | Admitting: *Deleted

## 2016-05-11 DIAGNOSIS — E118 Type 2 diabetes mellitus with unspecified complications: Secondary | ICD-10-CM

## 2016-05-11 MED ORDER — ATORVASTATIN CALCIUM 80 MG PO TABS: 80.0000 mg | ORAL_TABLET | Freq: Every day | ORAL | 3 refills | Status: DC

## 2016-05-11 MED ORDER — WARFARIN SODIUM 6 MG PO TABS: 6.0000 mg | ORAL_TABLET | Freq: Every day | ORAL | 3 refills | Status: DC

## 2016-05-11 MED ORDER — VALSARTAN 320 MG PO TABS
320.0000 mg | ORAL_TABLET | Freq: Every day | ORAL | 3 refills | Status: DC
Start: 2016-05-11 — End: 2016-10-18

## 2016-05-11 MED ORDER — METOPROLOL TARTRATE 25 MG PO TABS: 12.5000 mg | ORAL_TABLET | Freq: Two times a day (BID) | ORAL | 3 refills | Status: DC

## 2016-05-11 MED ORDER — METFORMIN HCL 500 MG PO TABS: 500.0000 mg | ORAL_TABLET | Freq: Two times a day (BID) | ORAL | 3 refills | Status: DC

## 2016-05-16 DIAGNOSIS — R05 Cough: Secondary | ICD-10-CM | POA: Diagnosis not present

## 2016-05-24 ENCOUNTER — Ambulatory Visit (INDEPENDENT_AMBULATORY_CARE_PROVIDER_SITE_OTHER): Payer: Medicare Other | Admitting: *Deleted

## 2016-05-24 DIAGNOSIS — Z5181 Encounter for therapeutic drug level monitoring: Secondary | ICD-10-CM | POA: Diagnosis not present

## 2016-05-24 DIAGNOSIS — Z8673 Personal history of transient ischemic attack (TIA), and cerebral infarction without residual deficits: Secondary | ICD-10-CM | POA: Diagnosis not present

## 2016-05-24 DIAGNOSIS — Z952 Presence of prosthetic heart valve: Secondary | ICD-10-CM

## 2016-05-24 DIAGNOSIS — I4891 Unspecified atrial fibrillation: Secondary | ICD-10-CM | POA: Diagnosis not present

## 2016-05-24 LAB — POCT INR: INR: 3.6

## 2016-05-25 ENCOUNTER — Ambulatory Visit: Payer: Medicare Other

## 2016-05-25 DIAGNOSIS — H353232 Exudative age-related macular degeneration, bilateral, with inactive choroidal neovascularization: Secondary | ICD-10-CM | POA: Diagnosis not present

## 2016-05-25 DIAGNOSIS — H43813 Vitreous degeneration, bilateral: Secondary | ICD-10-CM | POA: Diagnosis not present

## 2016-06-08 DIAGNOSIS — H353231 Exudative age-related macular degeneration, bilateral, with active choroidal neovascularization: Secondary | ICD-10-CM | POA: Diagnosis not present

## 2016-06-11 ENCOUNTER — Other Ambulatory Visit: Payer: Self-pay | Admitting: *Deleted

## 2016-06-11 MED ORDER — FUROSEMIDE 20 MG PO TABS: 20.0000 mg | ORAL_TABLET | ORAL | 3 refills | Status: DC

## 2016-06-11 MED ORDER — POTASSIUM CHLORIDE ER 10 MEQ PO TBCR: 10.0000 meq | EXTENDED_RELEASE_TABLET | ORAL | 3 refills | Status: DC

## 2016-06-11 NOTE — Telephone Encounter (Signed)
[facility] 

## 2016-06-13 DIAGNOSIS — H53413 Scotoma involving central area, bilateral: Secondary | ICD-10-CM | POA: Diagnosis not present

## 2016-06-13 DIAGNOSIS — H353231 Exudative age-related macular degeneration, bilateral, with active choroidal neovascularization: Secondary | ICD-10-CM | POA: Diagnosis not present

## 2016-06-14 ENCOUNTER — Ambulatory Visit (INDEPENDENT_AMBULATORY_CARE_PROVIDER_SITE_OTHER): Payer: Medicare Other | Admitting: *Deleted

## 2016-06-14 ENCOUNTER — Other Ambulatory Visit: Payer: Self-pay

## 2016-06-14 DIAGNOSIS — Z952 Presence of prosthetic heart valve: Secondary | ICD-10-CM

## 2016-06-14 DIAGNOSIS — I4891 Unspecified atrial fibrillation: Secondary | ICD-10-CM

## 2016-06-14 DIAGNOSIS — Z8673 Personal history of transient ischemic attack (TIA), and cerebral infarction without residual deficits: Secondary | ICD-10-CM | POA: Diagnosis not present

## 2016-06-14 DIAGNOSIS — Z5181 Encounter for therapeutic drug level monitoring: Secondary | ICD-10-CM

## 2016-06-14 LAB — POCT INR: INR: 1.6

## 2016-06-14 MED ORDER — POTASSIUM CHLORIDE ER 10 MEQ PO TBCR: 10.0000 meq | EXTENDED_RELEASE_TABLET | ORAL | 3 refills | Status: DC

## 2016-06-14 MED ORDER — FUROSEMIDE 20 MG PO TABS: 20.0000 mg | ORAL_TABLET | ORAL | 3 refills | Status: DC

## 2016-06-18 ENCOUNTER — Other Ambulatory Visit: Payer: Self-pay | Admitting: *Deleted

## 2016-06-18 MED ORDER — FUROSEMIDE 20 MG PO TABS: 20.0000 mg | ORAL_TABLET | ORAL | 3 refills | Status: DC

## 2016-06-18 MED ORDER — POTASSIUM CHLORIDE ER 10 MEQ PO TBCR: 10.0000 meq | EXTENDED_RELEASE_TABLET | ORAL | 3 refills | Status: DC

## 2016-06-18 NOTE — Telephone Encounter (Signed)
[facility] 

## 2016-06-20 DIAGNOSIS — H353231 Exudative age-related macular degeneration, bilateral, with active choroidal neovascularization: Secondary | ICD-10-CM | POA: Diagnosis not present

## 2016-06-20 DIAGNOSIS — H53413 Scotoma involving central area, bilateral: Secondary | ICD-10-CM | POA: Diagnosis not present

## 2016-06-28 ENCOUNTER — Ambulatory Visit (INDEPENDENT_AMBULATORY_CARE_PROVIDER_SITE_OTHER): Payer: Medicare Other | Admitting: *Deleted

## 2016-06-28 DIAGNOSIS — Z952 Presence of prosthetic heart valve: Secondary | ICD-10-CM

## 2016-06-28 DIAGNOSIS — Z8673 Personal history of transient ischemic attack (TIA), and cerebral infarction without residual deficits: Secondary | ICD-10-CM

## 2016-06-28 DIAGNOSIS — I4891 Unspecified atrial fibrillation: Secondary | ICD-10-CM

## 2016-06-28 DIAGNOSIS — Z5181 Encounter for therapeutic drug level monitoring: Secondary | ICD-10-CM

## 2016-06-28 LAB — POCT INR: INR: 3

## 2016-07-04 DIAGNOSIS — H53413 Scotoma involving central area, bilateral: Secondary | ICD-10-CM | POA: Diagnosis not present

## 2016-07-04 DIAGNOSIS — H353231 Exudative age-related macular degeneration, bilateral, with active choroidal neovascularization: Secondary | ICD-10-CM | POA: Diagnosis not present

## 2016-07-11 DIAGNOSIS — H53413 Scotoma involving central area, bilateral: Secondary | ICD-10-CM | POA: Diagnosis not present

## 2016-07-11 DIAGNOSIS — H353231 Exudative age-related macular degeneration, bilateral, with active choroidal neovascularization: Secondary | ICD-10-CM | POA: Diagnosis not present

## 2016-07-17 ENCOUNTER — Telehealth: Payer: Self-pay | Admitting: *Deleted

## 2016-07-17 NOTE — Telephone Encounter (Signed)
Pt missed her coumadin dose Sunday.  Daughter wants to know what pt should do.  Corliss Skains to have pt take extra 1/2 tablet (3mg ) of coumadin tonight then resume 6mg  daily.  Recheck INR on 07/24/16.  She verbalized understanding.

## 2016-07-21 ENCOUNTER — Emergency Department (HOSPITAL_COMMUNITY): Payer: Medicare Other

## 2016-07-21 ENCOUNTER — Emergency Department (HOSPITAL_COMMUNITY)
Admission: EM | Admit: 2016-07-21 | Discharge: 2016-07-22 | Disposition: A | Discharge status: Home / Self Care | Payer: Medicare Other | Attending: Emergency Medicine | Admitting: Emergency Medicine

## 2016-07-21 ENCOUNTER — Encounter (HOSPITAL_COMMUNITY): Payer: Self-pay | Admitting: *Deleted

## 2016-07-21 DIAGNOSIS — Z7901 Long term (current) use of anticoagulants: Secondary | ICD-10-CM | POA: Insufficient documentation

## 2016-07-21 DIAGNOSIS — E119 Type 2 diabetes mellitus without complications: Secondary | ICD-10-CM | POA: Insufficient documentation

## 2016-07-21 DIAGNOSIS — M5441 Lumbago with sciatica, right side: Secondary | ICD-10-CM | POA: Insufficient documentation

## 2016-07-21 DIAGNOSIS — I509 Heart failure, unspecified: Secondary | ICD-10-CM | POA: Insufficient documentation

## 2016-07-21 DIAGNOSIS — M545 Low back pain: Secondary | ICD-10-CM | POA: Diagnosis present

## 2016-07-21 DIAGNOSIS — Z7984 Long term (current) use of oral hypoglycemic drugs: Secondary | ICD-10-CM | POA: Diagnosis not present

## 2016-07-21 DIAGNOSIS — I2510 Atherosclerotic heart disease of native coronary artery without angina pectoris: Secondary | ICD-10-CM | POA: Insufficient documentation

## 2016-07-21 DIAGNOSIS — Z7982 Long term (current) use of aspirin: Secondary | ICD-10-CM | POA: Insufficient documentation

## 2016-07-21 DIAGNOSIS — I110 Hypertensive heart disease with heart failure: Secondary | ICD-10-CM | POA: Insufficient documentation

## 2016-07-21 DIAGNOSIS — M5431 Sciatica, right side: Secondary | ICD-10-CM | POA: Diagnosis not present

## 2016-07-21 DIAGNOSIS — Z87891 Personal history of nicotine dependence: Secondary | ICD-10-CM | POA: Insufficient documentation

## 2016-07-21 LAB — CBG MONITORING, ED: Glucose-Capillary: 92 mg/dL (ref 65–99)

## 2016-07-21 MED ORDER — HYDROCODONE-ACETAMINOPHEN 5-325 MG PO TABS
1.0000 | ORAL_TABLET | Freq: Once | ORAL | Status: AC
  Administered 2016-07-21: 1 via ORAL
  Filled 2016-07-21: qty 1

## 2016-07-21 MED ORDER — DIAZEPAM 2 MG PO TABS
2.0000 mg | ORAL_TABLET | Freq: Once | ORAL | Status: AC
  Administered 2016-07-21: 2 mg via ORAL
  Filled 2016-07-21: qty 1

## 2016-07-21 NOTE — ED Provider Notes (Signed)
[facility] DEPT Provider Note   CSN: [id number] Arrival date & time: 07/21/16  2136   By signing my name below, I, Hilbert Odor, attest that this documentation has been prepared under the direction and in the presence of Ezequiel Essex, MD. Electronically Signed: Hilbert Odor, Scribe. 07/21/16. 11:42 PM. History   Chief Complaint Chief Complaint  Patient presents with  . Back Pain    The history is provided by the patient. No language interpreter was used.  HPI Comments: Jeanette Yates is a 76 y.o. female who presents to the Emergency Department complaining of lower back pain for the past month. She states that last night she began to have severe back spasms and decided to come to the ED to be evaluated. She states that the pain radiates about half way down her right leg. She rates the pain at 9/10 currently. She tried taking tramadol last night with no significant relief in her pain. She also reports some tingling in her left leg since yesterday. She has a hx of sciatica and states that she has had similar back pain in the past. She is currently on coumadin. She has hx of CABG. She denies fevers, dysuria, vomiting, weakness, or abdominal pian.  Past Medical History:  Diagnosis Date  . Adjustment disorder 10/17/2005  . Atrial fibrillation ([place])   . Cataract    bilateral  . CHF (congestive heart failure) ([place])   . Contracture of knee joint 07/26/2009  . Coronary artery disease   . Degeneration of lumbar or lumbosacral intervertebral disc 07/26/2009  . Diabetes mellitus without complication ([place])   . Esophageal spasm   . Esophageal spasm   . Fibromyalgia   . Fibromyalgia   . Hearing loss 12/18/2010  . Heart murmur   . Hypercholesteremia   . Hypertension   . 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 Leaky heart valve   . Lyme disease   . Macular degeneration   . Macular degeneration of both eyes   . Migraine 10/17/2005  . Mixed incontinence  10/17/2005  . Osteoarthritis    multiple joints   . Overweight 09/19/2010  . Pneumonia   . Postartificial menopausal syndrome 10/10/2009  . Prinzmetal angina ([place]) 10/17/2005  . Psoriasis 06/12/2006  . Sciatica   . TIA (transient ischemic attack)   . Type II diabetes mellitus ([place])   . Urolith 10/17/2005    Patient Active Problem List   Diagnosis Date Noted  . S/P AVR (aortic valve replacement) 04/13/2016  . Encounter for therapeutic drug monitoring 01/02/2016  . Hx of TIA (transient ischemic attack) and stroke 12/06/2015  . Type II diabetes mellitus ([place])   . Osteoarthritis   . Fibromyalgia   . Atrial fibrillation ([place])   . CHF (congestive heart failure) ([place])   . Esophageal spasm   . Numbness on right side 05/03/2015  . TIA (transient ischemic attack) 05/02/2015  . Hypertension 05/02/2015    Past Surgical History:  Procedure Laterality Date  . ABDOMINAL HYSTERECTOMY    . AORTIC VALVE REPLACEMENT  12/10/2013  . CHOLECYSTECTOMY    . CORONARY ARTERY BYPASS GRAFT    . esophageal repair surgery    . FRACTURE SURGERY     wrist  . JOINT REPLACEMENT    . MITRAL VALVE REPAIR    . NISSEN FUNDOPLICATION    . REPLACEMENT TOTAL KNEE BILATERAL      OB History    Gravida Para Term Preterm AB Living   3 3 3  2   SAB TAB Ectopic Multiple Live Births                   Home Medications    Prior to Admission medications   Medication Sig Start Date End Date Taking? Authorizing Provider  acetaminophen (TYLENOL) 500 MG tablet Take 1,000 mg by mouth every 6 (six) hours as needed.     Historical Provider, MD  aspirin EC 81 MG tablet Take 81 mg by mouth every morning. 8 am    Historical Provider, MD  atorvastatin (LIPITOR) 80 MG tablet Take 1 tablet (80 mg total) by mouth daily. 05/11/16   Tiffany L Reed, DO  azithromycin (ZITHROMAX) 500 MG tablet 1 by mouth 30 minutes prior to dental procedure 05/03/16   Tiffany L Reed, DO  Blood Glucose Monitoring Suppl (FORACARE PREMIUM V10)  DEVI Use to test blood sugar three times daily. Dx: E11.9 12/28/15   Lauree Chandler, NP  furosemide (LASIX) 20 MG tablet Take 1 tablet (20 mg total) by mouth every morning. 8 am 06/18/16   Gayland Curry, DO  Lancet Devices (TRUEDRAW LANCING DEVICE) MISC Use as Directed 12/28/15   Lauree Chandler, NP  metFORMIN (GLUCOPHAGE) 500 MG tablet Take 1 tablet (500 mg total) by mouth 2 (two) times daily with a meal. 8am and 7pm 05/11/16   Tiffany L Reed, DO  metoprolol tartrate (LOPRESSOR) 25 MG tablet Take 0.5 tablets (12.5 mg total) by mouth 2 (two) times daily. 8 am & 7 pm 05/11/16   Tiffany L Reed, DO  Multiple Vitamins-Minerals (PRESERVISION AREDS 2 PO) Take 1 capsule by mouth daily.     Historical Provider, MD  potassium chloride (K-DUR) 10 MEQ tablet Take 1 tablet (10 mEq total) by mouth every morning. 8 am 06/18/16   Tiffany L Reed, DO  valsartan (DIOVAN) 320 MG tablet Take 1 tablet (320 mg total) by mouth daily. 05/11/16   Tiffany L Reed, DO  warfarin (COUMADIN) 6 MG tablet Take 1 tablet (6 mg total) by mouth daily. 05/11/16   Gayland Curry, DO    Family History Family History  Problem Relation Age of Onset  . Stroke Mother   . Heart failure Mother   . Heart disease Mother   . Mitral valve prolapse Mother   . Diabetes Father   . Heart attack Father   . Stroke Father   . Heart disease Father   . Hypertension Father   . Alzheimer's disease Father   . Stroke Maternal Grandmother   . Arthritis Maternal Grandfather     hands  . Breast cancer Paternal Grandmother   . Osteoporosis Paternal Grandmother     Social History Social History  Substance Use Topics  . Smoking status: Former Smoker    Packs/day: 1.50    Years: 15.00    Types: Cigarettes  . Smokeless tobacco: Never Used  . Alcohol use No     Allergies   Penicillins; Latex; Celecoxib; Iodine; Morphine and related; and Tape   Review of Systems Review of Systems  A complete 10 system review of systems was obtained and all  systems are negative except as noted in the HPI and PMH.   Physical Exam Updated Vital Signs BP (!) 183/85 (BP Location: Left Arm)   Pulse (!) 56   Temp 97.7 F (36.5 C) (Oral)   Resp 18   Ht 5\' 2"  (1.575 m)   Wt 153 lb 12.8 oz (69.8 kg)   SpO2 100%  BMI 28.13 kg/m   Physical Exam  Constitutional: She is oriented to person, place, and time. She appears well-developed and well-nourished. No distress.  HENT:  Head: Normocephalic and atraumatic.  Mouth/Throat: Oropharynx is clear and moist. No oropharyngeal exudate.  Eyes: Conjunctivae and EOM are normal. Pupils are equal, round, and reactive to light.  Neck: Normal range of motion. Neck supple.  No meningismus.  Cardiovascular: Normal rate, regular rhythm, normal heart sounds and intact distal pulses.   No murmur heard. Systolic murmur heard.  Pulmonary/Chest: Effort normal and breath sounds normal. No respiratory distress.  Abdominal: Soft. There is no tenderness. There is no rebound and no guarding.  Musculoskeletal: Normal range of motion. She exhibits tenderness. She exhibits no edema.  Right SI joint tenderness. No midline-lumbar tenderness. 5/5 strength in bilateral lower extremities. Ankle plantar and dorsiflexion intact. Great toe extension intact bilaterally. +2 DP and PT pulses. +2 patellar reflexes bilaterally. Normal gait.  Neurological: She is alert and oriented to person, place, and time. No cranial nerve deficit. She exhibits normal muscle tone. Coordination normal.   5/5 strength throughout. CN 2-12 intact.Equal grip strength.   Skin: Skin is warm.  Psychiatric: She has a normal mood and affect. Her behavior is normal.  Nursing note and vitals reviewed.    ED Treatments / Results  DIAGNOSTIC STUDIES: Oxygen Saturation is 97% on RA, normal by my interpretation.    COORDINATION OF CARE: 11:16 PM Discussed treatment plan with pt at bedside and pt agreed to plan. I will check the patient's X-ray.  Labs (all  labs ordered are listed, but only abnormal results are displayed) Labs Reviewed  CBG MONITORING, ED    EKG  EKG Interpretation None       Radiology Dg Lumbar Spine Complete  Result Date: 07/22/2016 CLINICAL DATA:  Low back pain and lumbar spasms for 4 months, worsening for few days. Pain radiates to RIGHT foot. EXAM: LUMBAR SPINE - COMPLETE 4+ VIEW COMPARISON:  None. FINDINGS: Five non rib-bearing lumbar-type vertebral bodies are intact and aligned with maintenance of the lumbar lordosis. Mild lower lumbar levoscoliosis. Severe L2-3, L4-5 and L5-S1 disc height loss, endplate sclerosis of marginal spurring compatible with degenerative discs, moderate at L3-4. Moderate lower lumbar facet arthropathy. No destructive bony lesions. Osteopenia. Sacroiliac joints are symmetric. Included prevertebral and paraspinal soft tissue planes are non-suspicious. Surgical clips in the included right abdomen compatible with cholecystectomy. Surgical clips project in LEFT upper quadrant. IMPRESSION: Degenerative lumbar spine without acute fracture deformity or malalignment. Electronically Signed   By: Elon Alas M.D.   On: 07/22/2016 00:41    Procedures Procedures (including critical care time)  Medications Ordered in ED Medications  HYDROcodone-acetaminophen (NORCO/VICODIN) 5-325 MG per tablet 1 tablet (1 tablet Oral Given 07/21/16 2328)  diazepam (VALIUM) tablet 2 mg (2 mg Oral Given 07/21/16 2328)  ketorolac (TORADOL) 30 MG/ML injection 30 mg (30 mg Intramuscular Given 07/22/16 0026)  HYDROmorphone (DILAUDID) injection 0.5 mg (0.5 mg Intravenous Given 07/22/16 0026)     Initial Impression / Assessment and Plan / ED Course  I have reviewed the triage vital signs and the nursing notes.  Pertinent labs & imaging results that were available during my care of the patient were reviewed by me and considered in my medical decision making (see chart for details).     Patient with 1 month of right-sided  low back pain that radiates down her right leg with worsening spasms tonight. History of sciatica in the past. No bowel or  bladder incontinence other than her usual stress incontinence. She is on coumadin for AVR.  Neuro exam reassuring. Doubt cord compression or cauda equina.  X-ray negative for acute fracture dislocation. Patient is able to ambulate with minimal assistance from her cane.  We'll treat for sciatica. Patient advised to monitor her blood sugars carefully while she is on steroids. Follow up with her primary care doctor for an MRI. Return to the ED with worsening symptoms including weakness, numbness or incontinence.  Final Clinical Impressions(s) / ED Diagnoses   Final diagnoses:  Right sided sciatica    New Prescriptions New Prescriptions   No medications on file   I personally performed the services described in this documentation, which was scribed in my presence. The recorded information has been reviewed and is accurate.    Ezequiel Essex, MD 07/22/16 [phone]

## 2016-07-21 NOTE — ED Triage Notes (Signed)
Pt reports pain and spasms in her lumbar region. Pt also reports pain in her right buttocks that goes under her right thigh and half way down her leg with tingling in her right foot. Pt has known sciatica.

## 2016-07-22 DIAGNOSIS — M5441 Lumbago with sciatica, right side: Secondary | ICD-10-CM | POA: Diagnosis not present

## 2016-07-22 DIAGNOSIS — M545 Low back pain: Secondary | ICD-10-CM | POA: Diagnosis not present

## 2016-07-22 MED ORDER — HYDROMORPHONE HCL 1 MG/ML IJ SOLN
0.5000 mg | Freq: Once | INTRAMUSCULAR | Status: AC
Start: 2016-07-22 — End: 2016-07-22
  Administered 2016-07-22: 0.5 mg via INTRAVENOUS
  Filled 2016-07-22: qty 1

## 2016-07-22 MED ORDER — TRAMADOL HCL 50 MG PO TABS: 50.0000 mg | ORAL_TABLET | Freq: Four times a day (QID) | ORAL | 0 refills | Status: DC | PRN

## 2016-07-22 MED ORDER — KETOROLAC TROMETHAMINE 30 MG/ML IJ SOLN
30.0000 mg | Freq: Once | INTRAMUSCULAR | Status: AC
  Administered 2016-07-22: 30 mg via INTRAMUSCULAR
  Filled 2016-07-22: qty 1

## 2016-07-22 MED ORDER — METHYLPREDNISOLONE 4 MG PO TBPK: ORAL_TABLET | ORAL | 0 refills | Status: DC

## 2016-07-22 NOTE — Discharge Instructions (Signed)
Follow up with your doctor for an MRI. Watch your blood sugars carefully while on the steroids. Return to the ED if you develop worsening pain, weakness, numbness, incontinence, or any other concerns.

## 2016-07-22 NOTE — ED Notes (Signed)
Pt ambulated to BR and back to room with cane, walking slowly, steady gait

## 2016-07-22 NOTE — ED Notes (Signed)
Pt a/o x 4, v/s stable, RX and verbal and written discharge instructions given to pt and dtr, verbalized understanding, pt has steady gait and taken to vehicle via wheelchair

## 2016-07-24 ENCOUNTER — Ambulatory Visit (INDEPENDENT_AMBULATORY_CARE_PROVIDER_SITE_OTHER): Payer: Medicare Other | Admitting: *Deleted

## 2016-07-24 DIAGNOSIS — Z8673 Personal history of transient ischemic attack (TIA), and cerebral infarction without residual deficits: Secondary | ICD-10-CM | POA: Diagnosis not present

## 2016-07-24 DIAGNOSIS — Z5181 Encounter for therapeutic drug level monitoring: Secondary | ICD-10-CM | POA: Diagnosis not present

## 2016-07-24 DIAGNOSIS — I4891 Unspecified atrial fibrillation: Secondary | ICD-10-CM

## 2016-07-24 DIAGNOSIS — Z952 Presence of prosthetic heart valve: Secondary | ICD-10-CM | POA: Diagnosis not present

## 2016-07-24 LAB — POCT INR: INR: 2.7

## 2016-08-02 ENCOUNTER — Ambulatory Visit: Payer: Medicare Other | Admitting: Nurse Practitioner

## 2016-08-09 ENCOUNTER — Telehealth: Payer: Self-pay

## 2016-08-09 ENCOUNTER — Ambulatory Visit (INDEPENDENT_AMBULATORY_CARE_PROVIDER_SITE_OTHER): Payer: Medicare Other | Admitting: *Deleted

## 2016-08-09 DIAGNOSIS — I4891 Unspecified atrial fibrillation: Secondary | ICD-10-CM

## 2016-08-09 DIAGNOSIS — Z8673 Personal history of transient ischemic attack (TIA), and cerebral infarction without residual deficits: Secondary | ICD-10-CM

## 2016-08-09 DIAGNOSIS — Z5181 Encounter for therapeutic drug level monitoring: Secondary | ICD-10-CM | POA: Diagnosis not present

## 2016-08-09 DIAGNOSIS — Z952 Presence of prosthetic heart valve: Secondary | ICD-10-CM | POA: Diagnosis not present

## 2016-08-09 LAB — POCT INR: INR: 2.4

## 2016-08-09 MED ORDER — TRAMADOL HCL 50 MG PO TABS: 50.0000 mg | ORAL_TABLET | Freq: Four times a day (QID) | ORAL | 0 refills | Status: DC | PRN

## 2016-08-09 NOTE — Telephone Encounter (Signed)
Patient's daughter called requesting rx for tramadol to be sent to [facility]

## 2016-09-04 DIAGNOSIS — R200 Anesthesia of skin: Secondary | ICD-10-CM | POA: Diagnosis not present

## 2016-09-04 DIAGNOSIS — I252 Old myocardial infarction: Secondary | ICD-10-CM | POA: Diagnosis not present

## 2016-09-04 DIAGNOSIS — E119 Type 2 diabetes mellitus without complications: Secondary | ICD-10-CM | POA: Diagnosis not present

## 2016-09-04 DIAGNOSIS — Z87891 Personal history of nicotine dependence: Secondary | ICD-10-CM | POA: Diagnosis not present

## 2016-09-04 DIAGNOSIS — Z951 Presence of aortocoronary bypass graft: Secondary | ICD-10-CM | POA: Diagnosis not present

## 2016-09-04 DIAGNOSIS — G9389 Other specified disorders of brain: Secondary | ICD-10-CM | POA: Diagnosis not present

## 2016-09-04 DIAGNOSIS — Z954 Presence of other heart-valve replacement: Secondary | ICD-10-CM | POA: Diagnosis not present

## 2016-09-04 DIAGNOSIS — R1319 Other dysphagia: Secondary | ICD-10-CM | POA: Diagnosis not present

## 2016-09-04 DIAGNOSIS — Z8619 Personal history of other infectious and parasitic diseases: Secondary | ICD-10-CM | POA: Diagnosis not present

## 2016-09-04 DIAGNOSIS — Z7901 Long term (current) use of anticoagulants: Secondary | ICD-10-CM | POA: Diagnosis not present

## 2016-09-04 DIAGNOSIS — R1310 Dysphagia, unspecified: Secondary | ICD-10-CM | POA: Diagnosis not present

## 2016-09-04 DIAGNOSIS — R202 Paresthesia of skin: Secondary | ICD-10-CM | POA: Diagnosis not present

## 2016-09-04 DIAGNOSIS — I4891 Unspecified atrial fibrillation: Secondary | ICD-10-CM | POA: Diagnosis not present

## 2016-09-04 DIAGNOSIS — G459 Transient cerebral ischemic attack, unspecified: Secondary | ICD-10-CM | POA: Diagnosis not present

## 2016-09-04 DIAGNOSIS — I10 Essential (primary) hypertension: Secondary | ICD-10-CM | POA: Diagnosis not present

## 2016-09-04 DIAGNOSIS — M797 Fibromyalgia: Secondary | ICD-10-CM | POA: Diagnosis not present

## 2016-09-05 ENCOUNTER — Telehealth: Payer: Self-pay | Admitting: *Deleted

## 2016-09-05 ENCOUNTER — Telehealth: Payer: Self-pay | Admitting: Cardiology

## 2016-09-05 ENCOUNTER — Telehealth: Payer: Self-pay | Admitting: Pharmacist

## 2016-09-05 DIAGNOSIS — R1319 Other dysphagia: Secondary | ICD-10-CM | POA: Diagnosis not present

## 2016-09-05 DIAGNOSIS — R1310 Dysphagia, unspecified: Secondary | ICD-10-CM | POA: Diagnosis not present

## 2016-09-05 DIAGNOSIS — R200 Anesthesia of skin: Secondary | ICD-10-CM | POA: Diagnosis not present

## 2016-09-05 DIAGNOSIS — G459 Transient cerebral ischemic attack, unspecified: Secondary | ICD-10-CM | POA: Diagnosis not present

## 2016-09-05 DIAGNOSIS — I10 Essential (primary) hypertension: Secondary | ICD-10-CM | POA: Diagnosis not present

## 2016-09-05 DIAGNOSIS — I4891 Unspecified atrial fibrillation: Secondary | ICD-10-CM | POA: Diagnosis not present

## 2016-09-05 DIAGNOSIS — I672 Cerebral atherosclerosis: Secondary | ICD-10-CM | POA: Diagnosis not present

## 2016-09-05 DIAGNOSIS — I69091 Dysphagia following nontraumatic subarachnoid hemorrhage: Secondary | ICD-10-CM | POA: Diagnosis not present

## 2016-09-05 DIAGNOSIS — I517 Cardiomegaly: Secondary | ICD-10-CM | POA: Diagnosis not present

## 2016-09-05 NOTE — Telephone Encounter (Signed)
Received call from Yolanda Manges, [place] with [facility] in [place]. Pt is currently admitted with INR of 3.25 today. She is admitted with possible TIA. They are requesting INR be managed by our clinic again moving forward. She has been on 6mg  daily while admitted. She will likely be discharged this evening or tomorrow morning. She will take 3mg  tonight and then resume 6mg  daily. They have given her orders to be checked on Friday. INR will be called or faxed to our office.

## 2016-09-05 NOTE — Telephone Encounter (Signed)
Pt is on vacation in [place] right now, she's had a TIA while there and they're needing a release to fly her back home from Dr. Harl Bowie, if they cannot get this release she will have to find a new cardiologist up there to give her the release. Please give her daughter [name] a call @ [phone]

## 2016-09-05 NOTE — Telephone Encounter (Signed)
[name], daughter called and stated that she just wanted to let us know and have documented in the chart that patient had another TIA. Patient is on vacation in [place] and was admitted to the Hospital last night for a TIA.  Patient has an upcoming appointment in July and they have requested the records to be faxed to our office.

## 2016-09-05 NOTE — Telephone Encounter (Signed)
Noted.  Thanks.  Hopefully we do get those records.  It will be quite helpful.

## 2016-09-06 ENCOUNTER — Encounter: Payer: Self-pay | Admitting: *Deleted

## 2016-09-06 ENCOUNTER — Telehealth: Payer: Self-pay | Admitting: *Deleted

## 2016-09-06 NOTE — Telephone Encounter (Signed)
Called to notify daughter Nunzio Cory) that pt is able to travel by aircraft. Nunzio Cory became upset and stated that she was not under the understanding that the decision had been made on how her mother would travel. Nunzio Cory then asked if her mother may travel home by car? She was then told that Dr. Harl Bowie was in clinic with pts , but that I would try to have an answer for her by the end of the day today. Dr. Harl Bowie notified of request and stated that the pt may travel home by car with family assistance. Called and left msgs at [phone] and [phone] to notify pt and family. Daughter in law [name]) returned call. Nurse informed [name] that pt may travel by aircraft or car. [name] states that she was not informed by Nunzio Cory of wanting to travel by car only. Nurse has informed both family members that if pt is to travel by car she will need to stop every two hours and stretch. Both voiced understanding.

## 2016-09-06 NOTE — Telephone Encounter (Signed)
Spoke with pt's daughter [name], and advised her to get records form the facility that her mother was treated at so that Dr. Harl Bowie can review them before making any decisions. She voiced understanding and will get records sent.

## 2016-09-06 NOTE — Telephone Encounter (Signed)
-----   Message from Arnoldo Lenis, MD sent at 09/06/2016  3:37 PM EDT ----- Please provide letter to family for clearance to travel.  To whom it may concern, Ms Jeanette Yates is followed in our cardiology clinic. She was recently admitted to an outside hospital during a recent trip. After reviewing the records from this admission, it is my medical opinion that it is safe for her to travel back home by aircraft.   Carlyle Dolly MD

## 2016-09-10 NOTE — Progress Notes (Signed)
Cardiology Office Note   Date:  4/85/4627   ID:  Jeanette, Yates [birth year], MRN [id number]  PCP:  Gayland Curry, DO  Cardiologist:  Branch  Chief Complaint  Patient presents with  . Hypertension  . Coronary Artery Disease  . Cardiac Valve Problem  . Transient Ischemic Attack      History of Present Illness: Jeanette Yates is a 76 y.o. female who presents for ongoing assessment and management of valvular heart disease, history of severe MR and moderate AI, status post porcine aortic valve repair, mitral valve repair with Medtronic 3-D ring, with echocardiogram dated 04/2015 was mild MR and normally functioning vein AVR. Other history includes hypertension, hyperlipidemia, coronary artery disease with coronary artery bypass grafting (SVG to RCA) on 9 2015 at the time of valve repair and replacement, chronic systolic heart failure with most recent echocardiogram revealing an EF of 50-55%, paroxysmal atrial fibrillation, history of TIA.   The patient was last seen in the office one year ago by Dr. Harl Bowie. At that time she was clinically stable, she was continued on Coumadin therapy,   The patient recently visited family in [place] in [place]. During that time the patient had sudden symptoms of right-sided facial, tongue,.. She was brought to the emergency room. Symptoms lasted for about 20-30 minutes. She was worked up for a TIA. CT scan was completed with no acute pathology, echocardiogram revealed normal LV cavity size with mild concentric hypertrophy and normal systolic function. There were no regional wall motion abnormalities. EF was 65%.  The patient underwent a modified barium swallow which showed some trace penetration with thin liquids but no frank aspiration. She was advised to follow-up with GI specialist when she returned to [facility] She was continued on Coumadin therapy and was to follow with Coumadin clinic. Several phone calls were made to our office concerning  whether the patient could fly or should be driven home. Notes from Dr. Harl Bowie states that she is able to fly or ride in a car.  Discharge medications per discharge summary: Enteric-coated aspirin 81 mg daily  Atorvastatin 80 mg at at bedtime Diphenhydramine 25 mg every 6 hours when necessary Lasix 20 mg daily Glucophage 500 mg twice a day Metoprolol 12.5 mg twice a day Potassium 10 mEq daily Tramadol 50 mg every 6 hours when necessary Valsartan 320 mg daily Coumadin 6 mg daily  Discharge labs: Dated 09/04/2016 INR 2.9   hemoglobin 13.3; hematocrit 40.8; white blood cells 10.2; platelets 361 sodium 141; potassium 3.5; creatinine 1.0, GFR 51   She is here today complaining of generalized fatigue and dizziness. She is sleeping a lot since returning home 4 days ago. No chest pain, dyspnea or near syncope.  She has not been established with the neurologist, or GI specialist at this time. She has not seen her primary care physician either.   Past Medical History:  Diagnosis Date  . Adjustment disorder 10/17/2005  . Atrial fibrillation ([place])   . Cataract    bilateral  . CHF (congestive heart failure) ([place])   . Contracture of knee joint 07/26/2009  . Coronary artery disease   . Degeneration of lumbar or lumbosacral intervertebral disc 07/26/2009  . Diabetes mellitus without complication ([place])   . Esophageal spasm   . Esophageal spasm   . Fibromyalgia   . Fibromyalgia   . Hearing loss 12/18/2010  . Heart murmur   . Hypercholesteremia   . Hypertension   . Insomnia disorder related to known  organic factor 10/10/2009  . Leaky heart valve   . Lyme disease   . Macular degeneration   . Macular degeneration of both eyes   . Migraine 10/17/2005  . Mixed incontinence 10/17/2005  . Osteoarthritis    multiple joints   . Overweight 09/19/2010  . Pneumonia   . Postartificial menopausal syndrome 10/10/2009  . Prinzmetal angina ([place]) 10/17/2005  . Psoriasis 06/12/2006  . Sciatica   .  TIA (transient ischemic attack)   . Type II diabetes mellitus ([place])   . Jeanette Yates 10/17/2005    Past Surgical History:  Procedure Laterality Date  . ABDOMINAL HYSTERECTOMY    . AORTIC VALVE REPLACEMENT  12/10/2013  . CHOLECYSTECTOMY    . CORONARY ARTERY BYPASS GRAFT    . esophageal repair surgery    . FRACTURE SURGERY     wrist  . JOINT REPLACEMENT    . MITRAL VALVE REPAIR    . NISSEN FUNDOPLICATION    . REPLACEMENT TOTAL KNEE BILATERAL       Current Outpatient Prescriptions  Medication Sig Dispense Refill  . acetaminophen (TYLENOL) 500 MG tablet Take 1,000 mg by mouth every 8 (eight) hours as needed.     Marland Kitchen aspirin EC 81 MG tablet Take 81 mg by mouth every morning. 8 am    . atorvastatin (LIPITOR) 80 MG tablet Take 1 tablet (80 mg total) by mouth daily. 90 tablet 3  . Blood Glucose Monitoring Suppl (FORACARE PREMIUM V10) DEVI Use to test blood sugar three times daily. Dx: E11.9 1 Device 0  . diphenhydrAMINE (BENADRYL) 25 MG tablet Take 25 mg by mouth every 6 (six) hours as needed.    . furosemide (LASIX) 20 MG tablet Take 1 tablet (20 mg total) by mouth every morning. 8 am (Patient taking differently: Take 20 mg by mouth every morning. 8 am) 90 tablet 3  . ibuprofen (ADVIL,MOTRIN) 200 MG tablet Take 200 mg by mouth every 6 (six) hours as needed.    . Ibuprofen-Diphenhydramine Cit (IBUPROFEN PM) 200-38 MG TABS Take 2 capsules by mouth at bedtime as needed.    Elmore Guise Devices (TRUEDRAW LANCING DEVICE) MISC Use as Directed 1 each 0  . loperamide (IMODIUM A-D) 2 MG tablet Take 2 mg by mouth as needed for diarrhea or loose stools.    . Loratadine 10 MG CAPS Take 1 capsule by mouth daily as needed.    . metFORMIN (GLUCOPHAGE) 500 MG tablet Take 1 tablet (500 mg total) by mouth 2 (two) times daily with a meal. 8am and 7pm 180 tablet 3  . metoprolol tartrate (LOPRESSOR) 25 MG tablet Take 0.5 tablets (12.5 mg total) by mouth 2 (two) times daily. 8 am & 7 pm (Patient taking differently: Take  12.5 mg by mouth 2 (two) times daily. 8 am & 7 pm) 180 tablet 3  . Multiple Vitamins-Minerals (PRESERVISION AREDS 2 PO) Take 1 capsule by mouth daily.     . naproxen sodium (ANAPROX) 220 MG tablet Take 220 mg by mouth 2 (two) times daily as needed.    . potassium chloride (K-DUR) 10 MEQ tablet Take 1 tablet (10 mEq total) by mouth every morning. 8 am 90 tablet 3  . traMADol (ULTRAM) 50 MG tablet Take 1 tablet (50 mg total) by mouth every 6 (six) hours as needed. 120 tablet 0  . valsartan (DIOVAN) 320 MG tablet Take 1 tablet (320 mg total) by mouth daily. 90 tablet 3  . warfarin (COUMADIN) 6 MG tablet Take 1 tablet (6  mg total) by mouth daily. 90 tablet 3   No current facility-administered medications for this visit.     Allergies:   Penicillins; Latex; Celecoxib; Iodine; Morphine and related; and Tape    Social History:  The patient  reports that she quit smoking about 50 years ago. Her smoking use included Cigarettes. She has a 22.50 pack-year smoking history. She has never used smokeless tobacco. She reports that she does not drink alcohol or use drugs.   Family History:  The patient's family history includes Alzheimer's disease in her father; Arthritis in her maternal grandfather; Breast cancer in her paternal grandmother; Diabetes in her father; Heart attack in her father; Heart disease in her father and mother; Heart failure in her mother; Hypertension in her father; Mitral valve prolapse in her mother; Osteoporosis in her paternal grandmother; Stroke in her father, maternal grandmother, and mother.    ROS: All other systems are reviewed and negative. Unless otherwise mentioned in H&P    PHYSICAL EXAM: VS:  BP 128/64   Pulse 65   Ht 5\' 2"  (1.575 m)   Wt 156 lb (70.8 kg)   SpO2 96%   BMI 28.53 kg/m  , BMI Body mass index is 28.53 kg/m. GEN: Well nourished, well developed, in no acute distress  HEENT: normal  Neck: no JVD, carotid bruits, or masses Cardiac: RRR; 2/6 harsh  holosystolic murmur, rubs, or gallops,no edema  Respiratory:  clear to auscultation bilaterally, normal work of breathing GI: soft, nontender, nondistended, + BS MS: no deformity or atrophy  Skin: warm and dry, no rash Neuro:  Strength and sensation are intact Psych: euthymic mood, full affect   Recent Labs: 12/02/2015: TSH 0.70 04/13/2016: ALT 16; BUN 23; Creat 0.88; Hemoglobin 12.6; Platelets 409; Potassium 4.6; Sodium 139    Lipid Panel    Component Value Date/Time   CHOL 149 06/20/2015   TRIG 141 06/20/2015   HDL 52 06/20/2015   CHOLHDL 3.1 05/03/2015 0537   VLDL 28 05/03/2015 0537   LDLCALC 69 06/20/2015      Wt Readings from Last 3 Encounters:  09/11/16 156 lb (70.8 kg)  07/21/16 153 lb 12.8 oz (69.8 kg)  04/13/16 156 lb (70.8 kg)      Other studies Reviewed: Please see scanned documents from [place]  ASSESSMENT AND PLAN:  1. Valvular heart disease: Status post porcine aortic valve repair and mitral valve repair in 2017. She remains on Coumadin therapy INR done today in the office is 2.5. This is being reported to Coumadin clinic. Discharge INR was 2.9. She has a follow-up appointment on 09/20/2016 with the Coumadin clinic in [place] and she is advised to keep that. We will contact her for any changes in medication dosage.  2. Chronic encephalomalacia: This was found per CT scan in [place] after admission for right-sided numbness, tingling, and weakness in her face. I will refer her to neurology. She will continue Coumadin therapy, CT did not reveal any acute bleed.  3. Chronic dizziness: The patient is on 3 anti-histamines, she takes Benadryl when necessary, Advil PM, and loratadine. She is also on Lasix 20 mg daily. Self-reported CHF. I've completed orthostatics here in the office and she was not found be orthostatic. I've advised her not to overdo on antihistamines that she is taking too many at one time. She does have some daytime somnolence and falls asleep very  easily. I've advised her not to take the Advil PM and Benadryl at the same time. Uncertain if  this is related to encephalomaclcia versus medication response.  4. Hypercholesterolemia: Continue statin therapy.  5. Hypertension: Continue valsartan. May need to change Lasix to when necessary. Most recent creatinine 1.0 on discharge on 09/04/2016. Blood pressure is currently well-controlled. She is not orthostatic.  6. Unspecified dysphagia: Referral to GI, Dr. Oneida Alar and Sydell Axon.     Current medicines are reviewed at length with the patient today.   I spent greater than 40 minutes with this patient reviewing her hospital records, answering multiple questions, and making plans for referrals.  Labs/ tests ordered today include: No new labs currently as they have recently been completed 4 days ago during hospitalization.   Phill Myron. West Pugh, ANP, AACC   09/11/2016 4:55 PM    [facility] [address], [place], [place] [address] Phone: [phone]; Fax: [phone]

## 2016-09-11 ENCOUNTER — Ambulatory Visit (INDEPENDENT_AMBULATORY_CARE_PROVIDER_SITE_OTHER): Payer: Medicare Other | Admitting: Adult Health

## 2016-09-11 ENCOUNTER — Encounter: Payer: Self-pay | Admitting: Adult Health

## 2016-09-11 VITALS — BP 128/64 | HR 65 | Ht 62.0 in | Wt 156.0 lb

## 2016-09-11 DIAGNOSIS — I5032 Chronic diastolic (congestive) heart failure: Secondary | ICD-10-CM | POA: Diagnosis not present

## 2016-09-11 DIAGNOSIS — R42 Dizziness and giddiness: Secondary | ICD-10-CM | POA: Diagnosis not present

## 2016-09-11 DIAGNOSIS — Z952 Presence of prosthetic heart valve: Secondary | ICD-10-CM

## 2016-09-11 DIAGNOSIS — Z9189 Other specified personal risk factors, not elsewhere classified: Secondary | ICD-10-CM | POA: Diagnosis not present

## 2016-09-11 DIAGNOSIS — E7800 Pure hypercholesterolemia, unspecified: Secondary | ICD-10-CM

## 2016-09-11 DIAGNOSIS — I10 Essential (primary) hypertension: Secondary | ICD-10-CM

## 2016-09-11 DIAGNOSIS — A4902 Methicillin resistant Staphylococcus aureus infection, unspecified site: Secondary | ICD-10-CM

## 2016-09-11 DIAGNOSIS — Z8673 Personal history of transient ischemic attack (TIA), and cerebral infarction without residual deficits: Secondary | ICD-10-CM | POA: Diagnosis not present

## 2016-09-11 NOTE — Patient Instructions (Addendum)
Your INR is 2.5 today. I messaged Edrick Oh RN in coumadin clinic.She will call you.    Keep apt with Dr Harl Bowie on 10/08/16 as planned   You have been referred to [facility] neurology, they will call you for apt    No change in medicines      Thank you for choosing [facility] !

## 2016-09-12 ENCOUNTER — Ambulatory Visit (INDEPENDENT_AMBULATORY_CARE_PROVIDER_SITE_OTHER): Payer: Medicare Other | Admitting: *Deleted

## 2016-09-12 DIAGNOSIS — Z5181 Encounter for therapeutic drug level monitoring: Secondary | ICD-10-CM | POA: Diagnosis not present

## 2016-09-12 DIAGNOSIS — Z952 Presence of prosthetic heart valve: Secondary | ICD-10-CM | POA: Diagnosis not present

## 2016-09-12 DIAGNOSIS — Z8673 Personal history of transient ischemic attack (TIA), and cerebral infarction without residual deficits: Secondary | ICD-10-CM

## 2016-09-12 DIAGNOSIS — I4891 Unspecified atrial fibrillation: Secondary | ICD-10-CM | POA: Diagnosis not present

## 2016-09-12 LAB — POCT INR: INR: 2.5

## 2016-09-13 ENCOUNTER — Telehealth: Payer: Self-pay

## 2016-09-13 DIAGNOSIS — R1310 Dysphagia, unspecified: Secondary | ICD-10-CM

## 2016-09-13 NOTE — Telephone Encounter (Signed)
-----   Message from Lendon Colonel, NP sent at 09/13/2016  7:14 AM EDT ----- Regarding: Referral to GI Please send GI referral, dysphagia, noted on recent hospital admission in [place]. Please send them copy of hospital records or note where they are if already scanned.  Thank you.

## 2016-09-13 NOTE — Telephone Encounter (Signed)
Ref placed to GI,daughter [name] made aware, tests results from [facility] records scanned per [facility] into media, all other records are batched and sent to central scanning

## 2016-09-17 ENCOUNTER — Encounter: Payer: Self-pay | Admitting: Neurology

## 2016-09-17 ENCOUNTER — Ambulatory Visit (INDEPENDENT_AMBULATORY_CARE_PROVIDER_SITE_OTHER): Payer: Medicare Other | Admitting: Neurology

## 2016-09-17 VITALS — BP 126/60 | HR 68 | Ht 62.0 in | Wt 157.0 lb

## 2016-09-17 DIAGNOSIS — I6521 Occlusion and stenosis of right carotid artery: Secondary | ICD-10-CM

## 2016-09-17 DIAGNOSIS — E785 Hyperlipidemia, unspecified: Secondary | ICD-10-CM

## 2016-09-17 DIAGNOSIS — I38 Endocarditis, valve unspecified: Secondary | ICD-10-CM | POA: Diagnosis not present

## 2016-09-17 DIAGNOSIS — G451 Carotid artery syndrome (hemispheric): Secondary | ICD-10-CM | POA: Diagnosis not present

## 2016-09-17 NOTE — Patient Instructions (Signed)
1.  Continue aspirin and Coumadin 2.  Continue Lipitor 3.  Continue diabetic heart healthy diet 4.  Continue routine exercise 5.  We will repeat carotid doppler in one year 6.  Follow up in 6 months.  Transient Ischemic Attack A transient ischemic attack (TIA) is a "warning stroke" that causes stroke-like symptoms. Unlike a stroke, a TIA does not cause permanent damage to the brain. The symptoms of a TIA can happen very fast and do not last long. It is important to know the symptoms of a TIA and what to do. This can help prevent a major stroke or death. What are the causes? A TIA is caused by a temporary blockage in an artery in the brain or neck (carotid artery). The blockage does not allow the brain to get the blood supply it needs and can cause different symptoms. The blockage can be caused by either:  A blood clot.  Fatty buildup (plaque) in a neck or brain artery.  What increases the risk?  High blood pressure (hypertension).  High cholesterol.  Diabetes mellitus.  Heart disease.  The buildup of plaque in the blood vessels (peripheral artery disease or atherosclerosis).  The buildup of plaque in the blood vessels that provide blood and oxygen to the brain (carotid artery stenosis).  An abnormal heart rhythm (atrial fibrillation).  Obesity.  Using any tobacco products, including cigarettes, chewing tobacco, or electronic cigarettes.  Taking oral contraceptives, especially in combination with using tobacco.  Physical inactivity.  A diet high in fats, salt (sodium), and calories.  Excessive alcohol use.  Use of illegal drugs (especially cocaine and methamphetamine).  Being female.  Being African American.  Being over the age of 51 years.  Family history of stroke.  Previous history of blood clots, stroke, TIA, or heart attack.  Sickle cell disease. What are the signs or symptoms? TIA symptoms are the same as a stroke but are temporary. These symptoms usually  develop suddenly, or may be newly present upon waking from sleep:  Sudden weakness or numbness of the face, arm, or leg, especially on one side of the body.  Sudden trouble walking or difficulty moving arms or legs.  Sudden confusion.  Sudden personality changes.  Trouble speaking (aphasia) or understanding.  Difficulty swallowing.  Sudden trouble seeing in one or both eyes.  Double vision.  Dizziness.  Loss of balance or coordination.  Sudden severe headache with no known cause.  Trouble reading or writing.  Loss of bowel or bladder control.  Loss of consciousness.  How is this diagnosed? Your health care provider may be able to determine the presence or absence of a TIA based on your symptoms, history, and physical exam. CT scan of the brain is usually performed to help identify a TIA. Other tests may include:  Electrocardiography (ECG).  Continuous heart monitoring.  Echocardiography.  Carotid ultrasonography.  MRI.  A scan of the brain circulation.  Blood tests.  How is this treated? Since the symptoms of TIA are the same as a stroke, it is important to seek treatment as soon as possible. You may need a medicine to dissolve a blood clot (thrombolytic) if that is the cause of the TIA. This medicine cannot be given if too much time has passed. Treatment may also include:  Rest, oxygen, fluids through an IV tube, and medicines to thin the blood (anticoagulants).  Measures will be taken to prevent short-term and long-term complications, including infection from breathing foreign material into the lungs (aspiration pneumonia),  blood clots in the legs, and falls.  Procedures to either remove plaque in the carotid arteries or dilate carotid arteries that have narrowed due to plaque. Those procedures are: ? Carotid endarterectomy. ? Carotid angioplasty and stenting.  Medicines and diet may be used to address diabetes, high blood pressure, and other underlying  risk factors.  Follow these instructions at home:  Take medicines only as directed by your health care provider. Follow the directions carefully. Medicines may be used to control risk factors for a stroke. Be sure you understand all your medicine instructions.  You may be told to take aspirin or the anticoagulant warfarin. Warfarin needs to be taken exactly as instructed. ? Taking too much or too little warfarin is dangerous. Too much warfarin increases the risk of bleeding. Too little warfarin continues to allow the risk for blood clots. While taking warfarin, you will need to have regular blood tests to measure your blood clotting time. A PT blood test measures how long it takes for blood to clot. Your PT is used to calculate another value called an INR. Your PT and INR help your health care provider to adjust your dose of warfarin. The dose can change for many reasons. It is critically important that you take warfarin exactly as prescribed. ? Many foods, especially foods high in vitamin K can interfere with warfarin and affect the PT and INR. Foods high in vitamin K include spinach, kale, broccoli, cabbage, collard and turnip greens, Brussels sprouts, peas, cauliflower, seaweed, and parsley, as well as beef and pork liver, green tea, and soybean oil. You should eat a consistent amount of foods high in vitamin K. Avoid major changes in your diet, or notify your health care provider before changing your diet. Arrange a visit with a dietitian to answer your questions. ? Many medicines can interfere with warfarin and affect the PT and INR. You must tell your health care provider about any and all medicines you take; this includes all vitamins and supplements. Be especially cautious with aspirin and anti-inflammatory medicines. Do not take or discontinue any prescribed or over-the-counter medicine except on the advice of your health care provider or pharmacist. ? Warfarin can have side effects, such as  excessive bruising or bleeding. You will need to hold pressure over cuts for longer than usual. Your health care provider or pharmacist will discuss other potential side effects. ? Avoid sports or activities that may cause injury or bleeding. ? Be careful when shaving, flossing your teeth, or handling sharp objects. ? Alcohol can change the body's ability to handle warfarin. It is best to avoid alcoholic drinks or consume only very small amounts while taking warfarin. Notify your health care provider if you change your alcohol intake. ? Notify your dentist or other health care providers before procedures.  Eat a diet that includes 5 or more servings of fruits and vegetables each day. This may reduce the risk of stroke. Certain diets may be prescribed to address high blood pressure, high cholesterol, diabetes, or obesity. ? A diet low in sodium, saturated fat, trans fat, and cholesterol is recommended to manage high blood pressure. ? A diet low in saturated fat, trans fat, and cholesterol, and high in fiber may control cholesterol levels. ? A controlled-carbohydrate, controlled-sugar diet is recommended to manage diabetes. ? A reduced-calorie diet that is low in sodium, saturated fat, trans fat, and cholesterol is recommended to manage obesity.  Maintain a healthy weight.  Stay physically active. It is recommended that you  get at least 30 minutes of activity on most or all days.  Do not use any tobacco products, including cigarettes, chewing tobacco, or electronic cigarettes. If you need help quitting, ask your health care provider.  Limit alcohol intake to no more than 1 drink per day for nonpregnant women and 2 drinks per day for men. One drink equals 12 ounces of beer, 5 ounces of wine, or 1 ounces of hard liquor.  Do not abuse drugs.  A safe home environment is important to reduce the risk of falls. Your health care provider may arrange for specialists to evaluate your home. Having grab  bars in the bedroom and bathroom is often important. Your health care provider may arrange for equipment to be used at home, such as raised toilets and a seat for the shower.  Follow all instructions for follow-up with your health care provider. This is very important. This includes any referrals and lab tests. Proper follow-up can prevent a stroke or another TIA from occurring. How is this prevented? The risk of a TIA can be decreased by appropriately treating high blood pressure, high cholesterol, diabetes, heart disease, and obesity, and by quitting smoking, limiting alcohol, and staying physically active. Contact a health care provider if:  You have personality changes.  You have difficulty swallowing.  You are seeing double.  You have dizziness.  You have a fever. Get help right away if: Any of the following symptoms may represent a serious problem that is an emergency. Do not wait to see if the symptoms will go away. Get medical help right away. Call your local emergency services (911 in [place] Do not drive yourself to the hospital.  You have sudden weakness or numbness of the face, arm, or leg, especially on one side of the body.  You have sudden trouble walking or difficulty moving arms or legs.  You have sudden confusion.  You have trouble speaking (aphasia) or understanding.  You have sudden trouble seeing in one or both eyes.  You have a loss of balance or coordination.  You have a sudden, severe headache with no known cause.  You have new chest pain or an irregular heartbeat.  You have a partial or total loss of consciousness.  This information is not intended to replace advice given to you by your health care provider. Make sure you discuss any questions you have with your health care provider. Document Released: 12/20/2004 Document Revised: 11/14/2015 Document Reviewed: 06/17/2013 Elsevier Interactive Patient Education  2017 [employer].

## 2016-09-17 NOTE — Progress Notes (Signed)
NEUROLOGY CONSULTATION NOTE  AMARRI MICHAELSON MRN: [id number] DOB: 1940-09-27  Referring provider: Jory Sims NP Primary care provider: Hollace Kinnier, DO  Reason for consult:  TIA  HISTORY OF PRESENT ILLNESS: Jeanette Yates is a 76 year old right-handed female with valvular heart disease status post porcine aortic valve repair and mitral valve repair with Medtronic 3-D ring, CAD status post CABG, hypertension, hyperlipidemia, former smoker and macular degeneration who presents for TIA.  She is accompanied by her daughter who supplements history.  MRI, CT, carotid doppler reports and labs reviewed..  While visiting family in [place], [place] about 2 1/2 weeks ago, she had an episode of sudden onset right sided numbness of the face, tongue, throat and fingers, lasting 10 to 20 minutes.  There was no associated unilateral weakness, slurred speech or facial droop (although her daughter reports th at her baseline left droopy eyelid seemed more pronounced for a few days afterwards).  For 24 hours prior to the event, she had a bifrontal/parietal 5/6 nonthrobbing headache.  She was brought to [facility] where she was worked up for TIA.  Blood pressure on arrival was reportedly elevated.  CT of head showed chronic encephalomalation in the right parietal region and right internal capsule, but no acute abnormality.  MRI of brain showed encephalomalacia in the right frontal and parietal lobes but no acute infarct or bleed.  Carotid doppler revealed 50-69% right ICA stenosis.  Echocardiogram demonstrated a LV EF of 65% with no cardiac source of emboli.  INR was 2.90 to 3.25, LDL 50, and Hgb A1c 6.5.  No changes were made to her medications.  Since then, she has been doing well.  She had a couple of those headaches again, but nothing severe.  Her blood pressure has been controlled.  She has not had any recurrent spells. She reports that she feels a little off-balance, leaning towards the  right.  She did have a similar TIA a year ago, presenting the same with right sided numbness, however she had some drooping of the left upper face (eyelid and cheek).  She takes ASA 81mg , Coumadin, Lipitor 80mg , Lopressor, Lasix and Diovan.  She has peripheral vision loss due to macular degeneration.  She has remote history of migraines.  PAST MEDICAL HISTORY: Past Medical History:  Diagnosis Date  . Adjustment disorder 10/17/2005  . Atrial fibrillation ([place])   . Cataract    bilateral  . CHF (congestive heart failure) ([place])   . Contracture of knee joint 07/26/2009  . Coronary artery disease   . Degeneration of lumbar or lumbosacral intervertebral disc 07/26/2009  . Diabetes mellitus without complication ([place])   . Esophageal spasm   . Esophageal spasm   . Fibromyalgia   . Fibromyalgia   . Hearing loss 12/18/2010  . Heart murmur   . Hypercholesteremia   . Hypertension   . 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 Leaky heart valve   . Lyme disease   . Macular degeneration   . Macular degeneration of both eyes   . Migraine 10/17/2005  . Mixed incontinence 10/17/2005  . Osteoarthritis    multiple joints   . Overweight 09/19/2010  . Pneumonia   . Postartificial menopausal syndrome 10/10/2009  . Prinzmetal angina ([place]) 10/17/2005  . Psoriasis 06/12/2006  . Sciatica   . TIA (transient ischemic attack)   . Type II diabetes mellitus ([place])   . Urolith 10/17/2005    PAST SURGICAL HISTORY: Past Surgical History:  Procedure Laterality Date  . ABDOMINAL HYSTERECTOMY    . AORTIC VALVE REPLACEMENT  12/10/2013  . CHOLECYSTECTOMY    . CORONARY ARTERY BYPASS GRAFT    . esophageal repair surgery    . FRACTURE SURGERY     wrist  . JOINT REPLACEMENT    . MITRAL VALVE REPAIR    . NISSEN FUNDOPLICATION    . REPLACEMENT TOTAL KNEE BILATERAL      MEDICATIONS: Current Outpatient Prescriptions on File Prior to Visit  Medication Sig Dispense Refill  .  acetaminophen (TYLENOL) 500 MG tablet Take 1,000 mg by mouth every 8 (eight) hours as needed.     Marland Kitchen aspirin EC 81 MG tablet Take 81 mg by mouth every morning. 8 am    . atorvastatin (LIPITOR) 80 MG tablet Take 1 tablet (80 mg total) by mouth daily. 90 tablet 3  . Blood Glucose Monitoring Suppl (FORACARE PREMIUM V10) DEVI Use to test blood sugar three times daily. Dx: E11.9 1 Device 0  . diphenhydrAMINE (BENADRYL) 25 MG tablet Take 25 mg by mouth every 6 (six) hours as needed.    . furosemide (LASIX) 20 MG tablet Take 1 tablet (20 mg total) by mouth every morning. 8 am (Patient taking differently: Take 20 mg by mouth every morning. 8 am) 90 tablet 3  . ibuprofen (ADVIL,MOTRIN) 200 MG tablet Take 200 mg by mouth every 6 (six) hours as needed.    . Ibuprofen-Diphenhydramine Cit (IBUPROFEN PM) 200-38 MG TABS Take 2 capsules by mouth at bedtime as needed.    Elmore Guise Devices (TRUEDRAW LANCING DEVICE) MISC Use as Directed 1 each 0  . loperamide (IMODIUM A-D) 2 MG tablet Take 2 mg by mouth as needed for diarrhea or loose stools.    . Loratadine 10 MG CAPS Take 1 capsule by mouth daily as needed.    . metFORMIN (GLUCOPHAGE) 500 MG tablet Take 1 tablet (500 mg total) by mouth 2 (two) times daily with a meal. 8am and 7pm 180 tablet 3  . metoprolol tartrate (LOPRESSOR) 25 MG tablet Take 0.5 tablets (12.5 mg total) by mouth 2 (two) times daily. 8 am & 7 pm (Patient taking differently: Take 12.5 mg by mouth 2 (two) times daily. 8 am & 7 pm) 180 tablet 3  . Multiple Vitamins-Minerals (PRESERVISION AREDS 2 PO) Take 1 capsule by mouth daily.     . naproxen sodium (ANAPROX) 220 MG tablet Take 220 mg by mouth 2 (two) times daily as needed.    . potassium chloride (K-DUR) 10 MEQ tablet Take 1 tablet (10 mEq total) by mouth every morning. 8 am 90 tablet 3  . traMADol (ULTRAM) 50 MG tablet Take 1 tablet (50 mg total) by mouth every 6 (six) hours as needed. 120 tablet 0  . valsartan (DIOVAN) 320 MG tablet Take 1 tablet  (320 mg total) by mouth daily. 90 tablet 3  . warfarin (COUMADIN) 6 MG tablet Take 1 tablet (6 mg total) by mouth daily. 90 tablet 3   No current facility-administered medications on file prior to visit.     ALLERGIES: Allergies  Allergen Reactions  . Penicillins Anaphylaxis    Has patient had a PCN reaction causing immediate rash, facial/tongue/throat swelling, SOB or lightheadedness with hypotension: Yes Has patient had a PCN reaction causing severe rash involving mucus membranes or skin necrosis: Yes Has patient had a PCN reaction that required hospitalization Yes Has patient had a PCN reaction occurring within the last 10 years: No If all of  the above answers are "NO", then may proceed with Cephalosporin use.   . Latex   . Celecoxib Itching  . Iodine   . Morphine And Related   . Tape     FAMILY HISTORY: Family History  Problem Relation Age of Onset  . Stroke Mother   . Heart failure Mother   . Heart disease Mother   . Mitral valve prolapse Mother   . Diabetes Father   . Heart attack Father   . Stroke Father   . Heart disease Father   . Hypertension Father   . Alzheimer's disease Father   . Stroke Maternal Grandmother   . Arthritis Maternal Grandfather        hands  . Breast cancer Paternal Grandmother   . Osteoporosis Paternal Grandmother     SOCIAL HISTORY: Social History   Social History  . Marital status: Widowed    Spouse name: N/A  . Number of children: N/A  . Years of education: N/A   Occupational History  . Not on file.   Social History Main Topics  . Smoking status: Former Smoker    Packs/day: 1.50    Years: 15.00    Types: Cigarettes    Quit date: 09/12/1966  . Smokeless tobacco: Never Used  . Alcohol use No  . Drug use: No  . Sexual activity: No   Other Topics Concern  . Not on file   Social History Narrative  . No narrative on file    REVIEW OF SYSTEMS: Constitutional: No fevers, chills, or sweats, no generalized fatigue, change  in appetite Eyes: No visual changes, double vision, eye pain Ear, nose and throat: No hearing loss, ear pain, nasal congestion, sore throat Cardiovascular: No chest pain, palpitations Respiratory:  No shortness of breath at rest or with exertion, wheezes GastrointestinaI: No nausea, vomiting, diarrhea, abdominal pain, fecal incontinence Genitourinary:  No dysuria, urinary retention or frequency Musculoskeletal:  No neck pain, back pain Integumentary: No rash, pruritus, skin lesions Neurological: as above Psychiatric: No depression, insomnia, anxiety Endocrine: No palpitations, fatigue, diaphoresis, mood swings, change in appetite, change in weight, increased thirst Hematologic/Lymphatic:  No purpura, petechiae. Allergic/Immunologic: no itchy/runny eyes, nasal congestion, recent allergic reactions, rashes  PHYSICAL EXAM: Vitals:   09/17/16 0748  BP: 126/60  Pulse: 68   General: No acute distress.  Patient appears well-groomed.  Head:  Normocephalic/atraumatic Eyes:  fundi examined but not visualized Neck: supple, no paraspinal tenderness, full range of motion Back: No paraspinal tenderness Heart: regular rate and rhythm Lungs: Clear to auscultation bilaterally. Vascular: No carotid bruits. Neurological Exam: Mental status: alert and oriented to person, place, and time, recent and remote memory intact, fund of knowledge intact, attention and concentration intact, speech fluent and not dysarthric, language intact. Cranial nerves: CN I: not tested CN II: pupils equal, round and reactive to light, bilateral peripheral vision loss CN III, IV, VI:  full range of motion, no nystagmus, no ptosis CN V: facial sensation intact CN VII: upper and lower face symmetric CN VIII: hearing intact CN IX, X: gag intact, uvula midline CN XI: sternocleidomastoid and trapezius muscles intact CN XII: tongue midline Bulk & Tone: normal, no fasciculations. Motor:  5/5 throughout  Sensation:  temperature and vibration sensation intact. Deep Tendon Reflexes:  2+ throughout, toes downgoing. Finger to nose testing:  Without dysmetria.  Heel to shin:  Without dysmetria.  Gait:  Normal station and stride.  Able to turn and tandem walk. Romberg negative.  IMPRESSION: 1.  TIA,  left hemispheric.  Similar TIA presentation a year ago.  With associated headache and history of migraines, consider atypical migraine as well. 2.  Asymptomatic right sided carotid artery stenosis with incidental remote right frontal stroke (not clinical). 3.  Hyperlipidemia, adequately treated 4.  Hypertension, adequately treated.  PLAN: She is already on optimized medical management.  No changes recommended. 1.  Continue ASA 81mg  and Coumadin 2.  Continue Lipitor 80mg  (LDL at goal less than 70) 3.  Continue optimized blood pressure control. 4.  Continue heart healthy diabetic diet and routine exercise 5.  Repeat carotid doppler in one year 6.  Follow up in 6 months.   Thank you for allowing me to take part in the care of this patient.  Metta Clines, DO  CC:  Hollace Kinnier, DO  Jory Sims, NP

## 2016-09-20 ENCOUNTER — Ambulatory Visit (INDEPENDENT_AMBULATORY_CARE_PROVIDER_SITE_OTHER): Payer: Medicare Other | Admitting: *Deleted

## 2016-09-20 DIAGNOSIS — I4891 Unspecified atrial fibrillation: Secondary | ICD-10-CM

## 2016-09-20 DIAGNOSIS — Z8673 Personal history of transient ischemic attack (TIA), and cerebral infarction without residual deficits: Secondary | ICD-10-CM | POA: Diagnosis not present

## 2016-09-20 DIAGNOSIS — Z952 Presence of prosthetic heart valve: Secondary | ICD-10-CM | POA: Diagnosis not present

## 2016-09-20 DIAGNOSIS — I6521 Occlusion and stenosis of right carotid artery: Secondary | ICD-10-CM

## 2016-09-20 DIAGNOSIS — Z5181 Encounter for therapeutic drug level monitoring: Secondary | ICD-10-CM

## 2016-09-20 LAB — POCT INR: INR: 2.4

## 2016-09-24 ENCOUNTER — Encounter: Payer: Self-pay | Admitting: Internal Medicine

## 2016-10-04 ENCOUNTER — Other Ambulatory Visit: Payer: Self-pay | Admitting: Internal Medicine

## 2016-10-04 ENCOUNTER — Other Ambulatory Visit: Payer: Medicare Other

## 2016-10-04 ENCOUNTER — Ambulatory Visit (INDEPENDENT_AMBULATORY_CARE_PROVIDER_SITE_OTHER): Payer: Medicare Other

## 2016-10-04 VITALS — BP 150/70 | HR 59 | Temp 98.3°F | Ht 62.0 in | Wt 157.0 lb

## 2016-10-04 DIAGNOSIS — I4891 Unspecified atrial fibrillation: Secondary | ICD-10-CM

## 2016-10-04 DIAGNOSIS — E118 Type 2 diabetes mellitus with unspecified complications: Secondary | ICD-10-CM

## 2016-10-04 DIAGNOSIS — Z1231 Encounter for screening mammogram for malignant neoplasm of breast: Secondary | ICD-10-CM | POA: Diagnosis not present

## 2016-10-04 DIAGNOSIS — E2839 Other primary ovarian failure: Secondary | ICD-10-CM

## 2016-10-04 DIAGNOSIS — I509 Heart failure, unspecified: Secondary | ICD-10-CM

## 2016-10-04 DIAGNOSIS — M797 Fibromyalgia: Secondary | ICD-10-CM

## 2016-10-04 DIAGNOSIS — Z1239 Encounter for other screening for malignant neoplasm of breast: Secondary | ICD-10-CM

## 2016-10-04 DIAGNOSIS — G451 Carotid artery syndrome (hemispheric): Secondary | ICD-10-CM

## 2016-10-04 DIAGNOSIS — E119 Type 2 diabetes mellitus without complications: Secondary | ICD-10-CM | POA: Diagnosis not present

## 2016-10-04 DIAGNOSIS — Z0000 Encounter for general adult medical examination without abnormal findings: Secondary | ICD-10-CM

## 2016-10-04 LAB — CBC WITH DIFFERENTIAL/PLATELET
Basophils Absolute: 88 cells/uL (ref 0–200)
Basophils Relative: 1 %
Eosinophils Absolute: 264 cells/uL (ref 15–500)
Eosinophils Relative: 3 %
HCT: 37.8 % (ref 35.0–45.0)
Hemoglobin: 12.4 g/dL (ref 11.7–15.5)
Lymphocytes Relative: 20 %
Lymphs Abs: 1760 cells/uL (ref 850–3900)
MCH: 28.2 pg (ref 27.0–33.0)
MCHC: 32.8 g/dL (ref 32.0–36.0)
MCV: 85.9 fL (ref 80.0–100.0)
MPV: 9.6 fL (ref 7.5–12.5)
Monocytes Absolute: 792 cells/uL (ref 200–950)
Monocytes Relative: 9 %
Neutro Abs: 5896 cells/uL (ref 1500–7800)
Neutrophils Relative %: 67 %
Platelets: 394 10*3/uL (ref 140–400)
RBC: 4.4 MIL/uL (ref 3.80–5.10)
RDW: 14.2 % (ref 11.0–15.0)
WBC: 8.8 10*3/uL (ref 3.8–10.8)

## 2016-10-04 LAB — LIPID PANEL
Cholesterol: 133 mg/dL (ref <200)
HDL: 53 mg/dL (ref >50)
LDL Cholesterol: 57 mg/dL (ref <100)
Total CHOL/HDL Ratio: 2.5 Ratio (ref <5.0)
Triglycerides: 116 mg/dL (ref <150)
VLDL: 23 mg/dL (ref <30)

## 2016-10-04 LAB — COMPLETE METABOLIC PANEL WITH GFR
ALT: 17 U/L (ref 6–29)
AST: 19 U/L (ref 10–35)
Albumin: 4 g/dL (ref 3.6–5.1)
Alkaline Phosphatase: 99 U/L (ref 33–130)
BUN: 20 mg/dL (ref 7–25)
CO2: 28 mmol/L (ref 20–31)
Calcium: 9.5 mg/dL (ref 8.6–10.4)
Chloride: 103 mmol/L (ref 98–110)
Creat: 0.85 mg/dL (ref 0.60–0.93)
GFR, Est African American: 79 mL/min (ref >=60)
GFR, Est Non African American: 68 mL/min (ref >=60)
Glucose, Bld: 128 mg/dL — ABNORMAL HIGH (ref 65–99)
Potassium: 4.5 mmol/L (ref 3.5–5.3)
Sodium: 139 mmol/L (ref 135–146)
Total Bilirubin: 0.5 mg/dL (ref 0.2–1.2)
Total Protein: 6.8 g/dL (ref 6.1–8.1)

## 2016-10-04 NOTE — Progress Notes (Signed)
Subjective:   Jeanette Yates is a 76 y.o. female who presents for an Initial Medicare Annual Wellness Visit.        Objective:    Today's Vitals   10/04/16 1008  BP: (!) 150/70  Pulse: (!) 59  Temp: 98.3 F (36.8 C)  TempSrc: Oral  SpO2: 97%  Weight: 157 lb (71.2 kg)  Height: 5\' 2"  (1.575 m)  PainSc: 3    Body mass index is 28.72 kg/m.   Current Medications (verified) Outpatient Encounter Prescriptions as of 10/04/2016  Medication Sig  . acetaminophen (TYLENOL) 500 MG tablet Take 1,000 mg by mouth every 8 (eight) hours as needed.   Marland Kitchen aspirin EC 81 MG tablet Take 81 mg by mouth every morning. 8 am  . atorvastatin (LIPITOR) 80 MG tablet Take 1 tablet (80 mg total) by mouth daily.  . Blood Glucose Monitoring Suppl (FORACARE PREMIUM V10) DEVI Use to test blood sugar three times daily. Dx: E11.9  . diphenhydrAMINE (BENADRYL) 25 MG tablet Take 25 mg by mouth every 6 (six) hours as needed.  . furosemide (LASIX) 20 MG tablet Take 1 tablet (20 mg total) by mouth every morning. 8 am (Patient taking differently: Take 20 mg by mouth every morning. 8 am)  . ibuprofen (ADVIL,MOTRIN) 200 MG tablet Take 200 mg by mouth every 6 (six) hours as needed.  . Ibuprofen-Diphenhydramine Cit (IBUPROFEN PM) 200-38 MG TABS Take 2 capsules by mouth at bedtime as needed.  Elmore Guise Devices (TRUEDRAW LANCING DEVICE) MISC Use as Directed  . loperamide (IMODIUM A-D) 2 MG tablet Take 2 mg by mouth as needed for diarrhea or loose stools.  . Loratadine 10 MG CAPS Take 1 capsule by mouth daily as needed.  . metFORMIN (GLUCOPHAGE) 500 MG tablet Take 1 tablet (500 mg total) by mouth 2 (two) times daily with a meal. 8am and 7pm  . metoprolol tartrate (LOPRESSOR) 25 MG tablet Take 0.5 tablets (12.5 mg total) by mouth 2 (two) times daily. 8 am & 7 pm (Patient taking differently: Take 12.5 mg by mouth 2 (two) times daily. 8 am & 7 pm)  . naproxen sodium (ANAPROX) 220 MG tablet Take 220 mg by mouth 2 (two) times daily  as needed.  . potassium chloride (K-DUR) 10 MEQ tablet Take 1 tablet (10 mEq total) by mouth every morning. 8 am  . traMADol (ULTRAM) 50 MG tablet Take 1 tablet (50 mg total) by mouth every 6 (six) hours as needed.  . valsartan (DIOVAN) 320 MG tablet Take 1 tablet (320 mg total) by mouth daily.  Marland Kitchen warfarin (COUMADIN) 6 MG tablet Take 1 tablet (6 mg total) by mouth daily.  . [DISCONTINUED] Multiple Vitamins-Minerals (PRESERVISION AREDS 2 PO) Take 1 capsule by mouth daily.    No facility-administered encounter medications on file as of 10/04/2016.     Allergies (verified) Penicillins; Latex; Celecoxib; Morphine and related; Tape; and Iodine   History: Past Medical History:  Diagnosis Date  . Adjustment disorder 10/17/2005  . Atrial fibrillation ([place])   . Cataract    bilateral  . CHF (congestive heart failure) ([place])   . Contracture of knee joint 07/26/2009  . Coronary artery disease   . Degeneration of lumbar or lumbosacral intervertebral disc 07/26/2009  . Diabetes mellitus without complication ([place])   . Esophageal spasm   . Esophageal spasm   . Fibromyalgia   . Fibromyalgia   . Hearing loss 12/18/2010  . Heart murmur   . Hypercholesteremia   . Hypertension   .  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 Leaky heart valve   . Lyme disease   . Macular degeneration   . Macular degeneration of both eyes   . Migraine 10/17/2005  . Mixed incontinence 10/17/2005  . Osteoarthritis    multiple joints   . Overweight 09/19/2010  . Pneumonia   . Postartificial menopausal syndrome 10/10/2009  . Prinzmetal angina ([place]) 10/17/2005  . Psoriasis 06/12/2006  . Sciatica   . TIA (transient ischemic attack)   . Type II diabetes mellitus ([place])   . Jeanette Yates 10/17/2005   Past Surgical History:  Procedure Laterality Date  . ABDOMINAL HYSTERECTOMY    . AORTIC VALVE REPLACEMENT  12/10/2013  . CHOLECYSTECTOMY    . CORONARY ARTERY BYPASS GRAFT    . esophageal repair  surgery    . FRACTURE SURGERY     wrist  . JOINT REPLACEMENT    . MITRAL VALVE REPAIR    . NISSEN FUNDOPLICATION    . REPLACEMENT TOTAL KNEE BILATERAL     Family History  Problem Relation Age of Onset  . Stroke Mother   . Heart failure Mother   . Heart disease Mother   . Mitral valve prolapse Mother   . Diabetes Father   . Heart attack Father   . Stroke Father   . Heart disease Father   . Hypertension Father   . Alzheimer's disease Father   . Stroke Maternal Grandmother   . Arthritis Maternal Grandfather        hands  . Breast cancer Paternal Grandmother   . Osteoporosis Paternal Grandmother    Social History   Occupational History  . Not on file.   Social History Main Topics  . Smoking status: Former Smoker    Packs/day: 1.00    Years: 20.00    Types: Cigarettes    Quit date: 09/12/1966  . Smokeless tobacco: Never Used  . Alcohol use No  . Drug use: No  . Sexual activity: No    Tobacco Counseling Counseling given: Not Answered   Activities of Daily Living In your present state of health, do you have any difficulty performing the following activities: 10/04/2016 12/06/2015  Hearing? Jeanette Yates  Vision? Y Y  Difficulty concentrating or making decisions? Jeanette Yates  Walking or climbing stairs? N N  Dressing or bathing? N N  Doing errands, shopping? N Y  Conservation officer, nature and eating ? N -  Using the Toilet? N -  In the past six months, have you accidently leaked urine? Y -  Do you have problems with loss of bowel control? N -  Managing your Medications? Y -  Managing your Finances? Y -  Housekeeping or managing your Housekeeping? Y -  Some recent data might be hidden    Immunizations and Health Maintenance Immunization History  Administered Date(s) Administered  . Influenza Split 12/23/2003, 01/15/2005, 03/11/2007  . Influenza,inj,Quad PF,36+ Mos 12/06/2015  . Influenza-Unspecified 11/23/2014  . Pneumococcal Conjugate-13 11/01/2015  . Tdap 08/15/2006   Health  Maintenance Due  Topic Date Due  . MAMMOGRAM  10/31/1992  . DEXA SCAN  11/01/2007  . TETANUS/TDAP  08/14/2016    Patient Care Team: Gayland Curry, DO as PCP - General (Geriatric Medicine) Gala Romney Cristopher Estimable, MD as Consulting Physician (Gastroenterology)  Indicate any recent Medical Services you may have received from other than [facility] providers in the past year (date may be approximate).     Assessment:   This is a routine wellness examination  for [place].   Hearing/Vision screen No exam data present  Dietary issues and exercise activities discussed: Current Exercise Habits: Structured exercise class, Type of exercise: strength training/weights;walking, Time (Minutes): 60, Frequency (Times/Week): 7, Weekly Exercise (Minutes/Week): 420, Intensity: Mild  Goals    . Plan meals          Pt will see a dietician to put together meals.      Depression Screen PHQ 2/9 Scores 10/04/2016 04/13/2016 12/06/2015 11/01/2015  PHQ - 2 Score 0 0 0 0    Fall Risk Fall Risk  10/04/2016 04/13/2016 12/06/2015 11/01/2015  Falls in the past year? No No No Yes  Number falls in past yr: - - - 2 or more  Injury with Fall? - - - Yes  Risk for fall due to : - - - Impaired balance/gait    Cognitive Function: MMSE - Mini Mental State Exam 10/04/2016 12/06/2015  Orientation to time 5 5  Orientation to Place 5 4  Registration 3 3  Attention/ Calculation 5 5  Recall 2 3  Language- name 2 objects 2 2  Language- repeat 1 1  Language- follow 3 step command 2 3  Language- read & follow direction 1 1  Write a sentence 1 1  Copy design 1 0  Copy design-comments - Patient has poor visual acuity.  Total score 28 28        Screening Tests Health Maintenance  Topic Date Due  . MAMMOGRAM  10/31/1992  . DEXA SCAN  11/01/2007  . TETANUS/TDAP  08/14/2016  . OPHTHALMOLOGY EXAM  10/10/2016  . HEMOGLOBIN A1C  10/11/2016  . INFLUENZA VACCINE  10/24/2016  . PNA vac Low Risk Adult (2 of 2 - PPSV23) 10/31/2016  .  FOOT EXAM  12/05/2016  . COLONOSCOPY  03/26/2022      Plan:    I have personally reviewed and addressed the Medicare Annual Wellness questionnaire and have noted the following in the patient's chart:  A. Medical and social history B. Use of alcohol, tobacco or illicit drugs  C. Current medications and supplements D. Functional ability and status E.  Nutritional status F.  Physical activity G. Advance directives H. List of other physicians I.  Hospitalizations, surgeries, and ER visits in previous 12 months J.  [place] to include hearing, vision, cognitive, depression L. Referrals and appointments - none  In addition, I have reviewed and discussed with patient certain preventive protocols, quality metrics, and best practice recommendations. A written personalized care plan for preventive services as well as general preventive health recommendations were provided to patient.  See attached scanned questionnaire for additional information.   Signed,   Rich Reining, RN Nurse Health Advisor   Quick Notes   Health Maintenance: MMg, dexa referral sent. Dietician referral sent per pt request.     Abnormal Screen: MMSE 28/30. Passed clock drawing.     Patient Concerns: Migranes- wants medications for this. Would like to do a full med review to see if pt needs to be on everything.     Nurse Concerns:  none

## 2016-10-04 NOTE — Progress Notes (Signed)
Pt walked in for labs today.  She was to be seen in March, but did not come so I did not have an opportunity to order the labs at that appt.  Labs orders placed today.

## 2016-10-04 NOTE — Patient Instructions (Signed)
[name] , Thank you for taking time to come for your Medicare Wellness Visit. I appreciate your ongoing commitment to your health goals. Please review the following plan we discussed and let me know if I can assist you in the future.   Screening recommendations/referrals: Colonoscopy up to date Mammogram due, referral sent Bone Density due, referral sent Recommended yearly ophthalmology/optometry visit for glaucoma screening and checkup Recommended yearly dental visit for hygiene and checkup  Vaccinations: Influenza vaccine up to date. Due 12/05/16 Pneumococcal vaccine 23 due 10/31/2016 Tdap vaccine due. Prescription sent to pharmacy Shingles vaccine due. Let us know next week if you want [place] to send over the prescription  Advanced directives: Advance directive discussed with you today. I have provided a copy for you to complete at home and have notarized. Once this is complete please bring a copy in to our office so we can scan it into your chart.   Conditions/risks identified: None   Next appointment: Dr. Mariea Clonts 10/11/16 @ 10am   Preventive Care 65 Years and Older, Female Preventive care refers to lifestyle choices and visits with your health care provider that can promote health and wellness. What does preventive care include?  A yearly physical exam. This is also called an annual well check.  Dental exams once or twice a year.  Routine eye exams. Ask your health care provider how often you should have your eyes checked.  Personal lifestyle choices, including:  Daily care of your teeth and gums.  Regular physical activity.  Eating a healthy diet.  Avoiding tobacco and drug use.  Limiting alcohol use.  Practicing safe sex.  Taking low-dose aspirin every day.  Taking vitamin and mineral supplements as recommended by your health care provider. What happens during an annual well check? The services and screenings done by your health care provider during your annual  well check will depend on your age, overall health, lifestyle risk factors, and family history of disease. Counseling  Your health care provider may ask you questions about your:  Alcohol use.  Tobacco use.  Drug use.  Emotional well-being.  Home and relationship well-being.  Sexual activity.  Eating habits.  History of falls.  Memory and ability to understand (cognition).  Work and work Statistician.  Reproductive health. Screening  You may have the following tests or measurements:  Height, weight, and BMI.  Blood pressure.  Lipid and cholesterol levels. These may be checked every 5 years, or more frequently if you are over 55 years old.  Skin check.  Lung cancer screening. You may have this screening every year starting at age 13 if you have a 30-pack-year history of smoking and currently smoke or have quit within the past 15 years.  Fecal occult blood test (FOBT) of the stool. You may have this test every year starting at age 8.  Flexible sigmoidoscopy or colonoscopy. You may have a sigmoidoscopy every 5 years or a colonoscopy every 10 years starting at age 28.  Hepatitis C blood test.  Hepatitis B blood test.  Sexually transmitted disease (STD) testing.  Diabetes screening. This is done by checking your blood sugar (glucose) after you have not eaten for a while (fasting). You may have this done every 1-3 years.  Bone density scan. This is done to screen for osteoporosis. You may have this done starting at age 13.  Mammogram. This may be done every 1-2 years. Talk to your health care provider about how often you should have regular mammograms. Talk with  your health care provider about your test results, treatment options, and if necessary, the need for more tests. Vaccines  Your health care provider may recommend certain vaccines, such as:  Influenza vaccine. This is recommended every year.  Tetanus, diphtheria, and acellular pertussis (Tdap, Td) vaccine.  You may need a Td booster every 10 years.  Zoster vaccine. You may need this after age 47.  Pneumococcal 13-valent conjugate (PCV13) vaccine. One dose is recommended after age 34.  Pneumococcal polysaccharide (PPSV23) vaccine. One dose is recommended after age 34. Talk to your health care provider about which screenings and vaccines you need and how often you need them. This information is not intended to replace advice given to you by your health care provider. Make sure you discuss any questions you have with your health care provider. Document Released: 04/08/2015 Document Revised: 11/30/2015 Document Reviewed: 01/11/2015 Elsevier Interactive Patient Education  2017 [place] Prevention in the Home Falls can cause injuries. They can happen to people of all ages. There are many things you can do to make your home safe and to help prevent falls. What can I do on the outside of my home?  Regularly fix the edges of walkways and driveways and fix any cracks.  Remove anything that might make you trip as you walk through a door, such as a raised step or threshold.  Trim any bushes or trees on the path to your home.  Use bright outdoor lighting.  Clear any walking paths of anything that might make someone trip, such as rocks or tools.  Regularly check to see if handrails are loose or broken. Make sure that both sides of any steps have handrails.  Any raised decks and porches should have guardrails on the edges.  Have any leaves, snow, or ice cleared regularly.  Use sand or salt on walking paths during winter.  Clean up any spills in your garage right away. This includes oil or grease spills. What can I do in the bathroom?  Use night lights.  Install grab bars by the toilet and in the tub and shower. Do not use towel bars as grab bars.  Use non-skid mats or decals in the tub or shower.  If you need to sit down in the shower, use a plastic, non-slip stool.  Keep the  floor dry. Clean up any water that spills on the floor as soon as it happens.  Remove soap buildup in the tub or shower regularly.  Attach bath mats securely with double-sided non-slip rug tape.  Do not have throw rugs and other things on the floor that can make you trip. What can I do in the bedroom?  Use night lights.  Make sure that you have a light by your bed that is easy to reach.  Do not use any sheets or blankets that are too big for your bed. They should not hang down onto the floor.  Have a firm chair that has side arms. You can use this for support while you get dressed.  Do not have throw rugs and other things on the floor that can make you trip. What can I do in the kitchen?  Clean up any spills right away.  Avoid walking on wet floors.  Keep items that you use a lot in easy-to-reach places.  If you need to reach something above you, use a strong step stool that has a grab bar.  Keep electrical cords out of the way.  Do not  use floor polish or wax that makes floors slippery. If you must use wax, use non-skid floor wax.  Do not have throw rugs and other things on the floor that can make you trip. What can I do with my stairs?  Do not leave any items on the stairs.  Make sure that there are handrails on both sides of the stairs and use them. Fix handrails that are broken or loose. Make sure that handrails are as long as the stairways.  Check any carpeting to make sure that it is firmly attached to the stairs. Fix any carpet that is loose or worn.  Avoid having throw rugs at the top or bottom of the stairs. If you do have throw rugs, attach them to the floor with carpet tape.  Make sure that you have a light switch at the top of the stairs and the bottom of the stairs. If you do not have them, ask someone to add them for you. What else can I do to help prevent falls?  Wear shoes that:  Do not have high heels.  Have rubber bottoms.  Are comfortable and fit  you well.  Are closed at the toe. Do not wear sandals.  If you use a stepladder:  Make sure that it is fully opened. Do not climb a closed stepladder.  Make sure that both sides of the stepladder are locked into place.  Ask someone to hold it for you, if possible.  Clearly mark and make sure that you can see:  Any grab bars or handrails.  First and last steps.  Where the edge of each step is.  Use tools that help you move around (mobility aids) if they are needed. These include:  Canes.  Walkers.  Scooters.  Crutches.  Turn on the lights when you go into a dark area. Replace any light bulbs as soon as they burn out.  Set up your furniture so you have a clear path. Avoid moving your furniture around.  If any of your floors are uneven, fix them.  If there are any pets around you, be aware of where they are.  Review your medicines with your doctor. Some medicines can make you feel dizzy. This can increase your chance of falling. Ask your doctor what other things that you can do to help prevent falls. This information is not intended to replace advice given to you by your health care provider. Make sure you discuss any questions you have with your health care provider. Document Released: 01/06/2009 Document Revised: 08/18/2015 Document Reviewed: 04/16/2014 Elsevier Interactive Patient Education  2017 [employer].

## 2016-10-05 LAB — HEMOGLOBIN A1C
Hgb A1c MFr Bld: 7 % — ABNORMAL HIGH (ref <5.7)
Mean Plasma Glucose: 154 mg/dL

## 2016-10-08 ENCOUNTER — Encounter: Payer: Self-pay | Admitting: Cardiology

## 2016-10-08 ENCOUNTER — Ambulatory Visit (INDEPENDENT_AMBULATORY_CARE_PROVIDER_SITE_OTHER): Payer: Medicare Other | Admitting: Cardiology

## 2016-10-08 VITALS — BP 128/68 | HR 55 | Ht 62.0 in | Wt 154.0 lb

## 2016-10-08 DIAGNOSIS — Z8673 Personal history of transient ischemic attack (TIA), and cerebral infarction without residual deficits: Secondary | ICD-10-CM

## 2016-10-08 DIAGNOSIS — I4891 Unspecified atrial fibrillation: Secondary | ICD-10-CM

## 2016-10-08 DIAGNOSIS — I6521 Occlusion and stenosis of right carotid artery: Secondary | ICD-10-CM

## 2016-10-08 DIAGNOSIS — I38 Endocarditis, valve unspecified: Secondary | ICD-10-CM

## 2016-10-08 DIAGNOSIS — Z952 Presence of prosthetic heart valve: Secondary | ICD-10-CM

## 2016-10-08 DIAGNOSIS — I10 Essential (primary) hypertension: Secondary | ICD-10-CM

## 2016-10-08 MED ORDER — FUROSEMIDE 20 MG PO TABS: ORAL_TABLET | ORAL | 3 refills | Status: DC

## 2016-10-08 MED ORDER — POTASSIUM CHLORIDE ER 10 MEQ PO TBCR: EXTENDED_RELEASE_TABLET | ORAL | 3 refills | Status: DC

## 2016-10-08 NOTE — Progress Notes (Signed)
Clinical Summary Ms. Panebianco is a 76 y.o.female seen today for follow up of the following medical problems.   1. Valvular heart disease - echo 10/2013 severe MR, moderate AI - TEE 11/2013 LVEF 25-30%, severe MR due to MV anular dilatation, moderate AI - 11/2013 s/p 23 mm Hancock II porcine valve AVR, MV repair with Medtronic 3D ring. Postop complicated by pleural effusion, afib - echo 04/2015 mild MR, normally functioning AVR    - some SOB in humidity only. No recent LE edema - compliant with meds. No bleeding troubles on coumadin and aspirin.  2. HTN - checks at home occasionally. Usually 120s-130s/70s-80s   3. Hyperlipidemia - compliant with lipitor - 09/2016 TC 133 TG 116 HDL 53 LDL 57   4. CAD - she reports prior MI - CABG 11/2013 SVG-RCA at time of valve surgery  - denies any recent chest pain.   5. DM2 - followed by pcp  6. History of chronic systolic HF, now with normalized LVEF - echo 10/2013 LVEF 30-35%, severe MR - cath 11/2013 without signifaicant CAD - 04/2015 echo LVEF 50-55%  - compliant with meds - no recent SOB or DOE, no LE edema   7. PAF - no recent symptoms - compliant with meds   8. TIA - recent TIA 09/05/16 while in [place]. INR was 2.9 at the time.  - followed by Dr [name]     Past Medical History:  Diagnosis Date  . Adjustment disorder 10/17/2005  . Atrial fibrillation ([place])   . Cataract    bilateral  . CHF (congestive heart failure) ([place])   . Contracture of knee joint 07/26/2009  . Coronary artery disease   . Degeneration of lumbar or lumbosacral intervertebral disc 07/26/2009  . Diabetes mellitus without complication ([place])   . Esophageal spasm   . Esophageal spasm   . Fibromyalgia   . Fibromyalgia   . Hearing loss 12/18/2010  . Heart murmur   . Hypercholesteremia   . Hypertension   . 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 Leaky heart valve   . Lyme disease   . Macular degeneration    . Macular degeneration of both eyes   . Migraine 10/17/2005  . Mixed incontinence 10/17/2005  . Osteoarthritis    multiple joints   . Overweight 09/19/2010  . Pneumonia   . Postartificial menopausal syndrome 10/10/2009  . Prinzmetal angina ([place]) 10/17/2005  . Psoriasis 06/12/2006  . Sciatica   . TIA (transient ischemic attack)   . Type II diabetes mellitus ([place])   . Urolith 10/17/2005     Allergies  Allergen Reactions  . Penicillins Anaphylaxis    Has patient had a PCN reaction causing immediate rash, facial/tongue/throat swelling, SOB or lightheadedness with hypotension: Yes Has patient had a PCN reaction causing severe rash involving mucus membranes or skin necrosis: Yes Has patient had a PCN reaction that required hospitalization Yes Has patient had a PCN reaction occurring within the last 10 years: No If all of the above answers are "NO", then may proceed with Cephalosporin use.   . Latex   . Celecoxib Itching  . Morphine And Related   . Tape   . Iodine      Current Outpatient Prescriptions  Medication Sig Dispense Refill  . acetaminophen (TYLENOL) 500 MG tablet Take 1,000 mg by mouth every 8 (eight) hours as needed.     Marland Kitchen aspirin EC 81 MG tablet Take 81 mg by mouth every morning.  8 am    . atorvastatin (LIPITOR) 80 MG tablet Take 1 tablet (80 mg total) by mouth daily. 90 tablet 3  . Blood Glucose Monitoring Suppl (FORACARE PREMIUM V10) DEVI Use to test blood sugar three times daily. Dx: E11.9 1 Device 0  . diphenhydrAMINE (BENADRYL) 25 MG tablet Take 25 mg by mouth every 6 (six) hours as needed.    . furosemide (LASIX) 20 MG tablet Take 1 tablet (20 mg total) by mouth every morning. 8 am (Patient taking differently: Take 20 mg by mouth every morning. 8 am) 90 tablet 3  . ibuprofen (ADVIL,MOTRIN) 200 MG tablet Take 200 mg by mouth every 6 (six) hours as needed.    . Ibuprofen-Diphenhydramine Cit (IBUPROFEN PM) 200-38 MG TABS Take 2 capsules by mouth at bedtime as  needed.    Elmore Guise Devices (TRUEDRAW LANCING DEVICE) MISC Use as Directed 1 each 0  . loperamide (IMODIUM A-D) 2 MG tablet Take 2 mg by mouth as needed for diarrhea or loose stools.    . Loratadine 10 MG CAPS Take 1 capsule by mouth daily as needed.    . metFORMIN (GLUCOPHAGE) 500 MG tablet Take 1 tablet (500 mg total) by mouth 2 (two) times daily with a meal. 8am and 7pm 180 tablet 3  . metoprolol tartrate (LOPRESSOR) 25 MG tablet Take 0.5 tablets (12.5 mg total) by mouth 2 (two) times daily. 8 am & 7 pm (Patient taking differently: Take 12.5 mg by mouth 2 (two) times daily. 8 am & 7 pm) 180 tablet 3  . naproxen sodium (ANAPROX) 220 MG tablet Take 220 mg by mouth 2 (two) times daily as needed.    . potassium chloride (K-DUR) 10 MEQ tablet Take 1 tablet (10 mEq total) by mouth every morning. 8 am 90 tablet 3  . Tdap (BOOSTRIX) 5-2.5-18.5 LF-MCG/0.5 injection Inject 0.5 mLs into the muscle once.    . traMADol (ULTRAM) 50 MG tablet Take 1 tablet (50 mg total) by mouth every 6 (six) hours as needed. 120 tablet 0  . valsartan (DIOVAN) 320 MG tablet Take 1 tablet (320 mg total) by mouth daily. 90 tablet 3  . warfarin (COUMADIN) 6 MG tablet Take 1 tablet (6 mg total) by mouth daily. 90 tablet 3   No current facility-administered medications for this visit.      Past Surgical History:  Procedure Laterality Date  . ABDOMINAL HYSTERECTOMY    . AORTIC VALVE REPLACEMENT  12/10/2013  . CHOLECYSTECTOMY    . CORONARY ARTERY BYPASS GRAFT    . esophageal repair surgery    . FRACTURE SURGERY     wrist  . JOINT REPLACEMENT    . MITRAL VALVE REPAIR    . NISSEN FUNDOPLICATION    . REPLACEMENT TOTAL KNEE BILATERAL       Allergies  Allergen Reactions  . Penicillins Anaphylaxis    Has patient had a PCN reaction causing immediate rash, facial/tongue/throat swelling, SOB or lightheadedness with hypotension: Yes Has patient had a PCN reaction causing severe rash involving mucus membranes or skin  necrosis: Yes Has patient had a PCN reaction that required hospitalization Yes Has patient had a PCN reaction occurring within the last 10 years: No If all of the above answers are "NO", then may proceed with Cephalosporin use.   . Latex   . Celecoxib Itching  . Morphine And Related   . Tape   . Iodine       Family History  Problem Relation Age of Onset  .  Stroke Mother   . Heart failure Mother   . Heart disease Mother   . Mitral valve prolapse Mother   . Diabetes Father   . Heart attack Father   . Stroke Father   . Heart disease Father   . Hypertension Father   . Alzheimer's disease Father   . Stroke Maternal Grandmother   . Arthritis Maternal Grandfather        hands  . Breast cancer Paternal Grandmother   . Osteoporosis Paternal Grandmother      Social History Ms. Urbani reports that she quit smoking about 50 years ago. Her smoking use included Cigarettes. She has a 20.00 pack-year smoking history. She has never used smokeless tobacco. Ms. Saffran reports that she does not drink alcohol.   Review of Systems CONSTITUTIONAL: No weight loss, fever, chills, weakness or fatigue.  HEENT: Eyes: No visual loss, blurred vision, double vision or yellow sclerae.No hearing loss, sneezing, congestion, runny nose or sore throat.  SKIN: No rash or itching.  CARDIOVASCULAR:per hpi RESPIRATORY: No shortness of breath, cough or sputum.  GASTROINTESTINAL: No anorexia, nausea, vomiting or diarrhea. No abdominal pain or blood.  GENITOURINARY: No burning on urination, no polyuria NEUROLOGICAL: No headache, dizziness, syncope, paralysis, ataxia, numbness or tingling in the extremities. No change in bowel or bladder control.  MUSCULOSKELETAL: No muscle, back pain, joint pain or stiffness.  LYMPHATICS: No enlarged nodes. No history of splenectomy.  PSYCHIATRIC: No history of depression or anxiety.  ENDOCRINOLOGIC: No reports of sweating, cold or heat intolerance. No polyuria or  polydipsia.  Marland Kitchen   Physical Examination Vitals:   10/08/16 1056  BP: 128/68  Pulse: (!) 55   Vitals:   10/08/16 1056  Weight: 154 lb (69.9 kg)  Height: 5\' 2"  (1.575 m)    Gen: resting comfortably, no acute distress HEENT: no scleral icterus, pupils equal round and reactive, no palptable cervical adenopathy,  CV: RRR, 2/6 systolic murmur rusb, no jvd. Bilateral carotid bruits Resp: Clear to auscultation bilaterally GI: abdomen is soft, non-tender, non-distended, normal bowel sounds, no hepatosplenomegaly MSK: extremities are warm, no edema.  Skin: warm, no rash Neuro:  no focal deficits Psych: appropriate affect   Diagnostic Studies 04/2015 echo Study Conclusions  - Left ventricle: The cavity size was normal. Wall thickness was  increased in a pattern of mild LVH. Systolic function was low  normal. The estimated ejection fraction was in the range of 50%  to 55%. Wall motion was normal; there were no regional wall  motion abnormalities. The study is not technically sufficient to  allow evaluation of LV diastolic function. - Aortic valve: Normally functioning bioprosthetic aortic valve  noted. There was no stenosis. There was no regurgitation. Peak  velocity (S): 282 cm/s. Mean gradient (S): 17 mm Hg. - Aorta: Mild ascending aortic dilatation. Maximal diameter 3.73  cm. - Mitral valve: S/p mitral valve repair. There was mild  regurgitation. Valve area by pressure half-time: 1.39 cm^2. - Left atrium: The atrium was moderately to severely dilated. - Right ventricle: Systolic function was mildly reduced. - Tricuspid valve: There was mild regurgitation. - Pulmonary arteries: Systolic pressure was mildly increased. PA  peak pressure: 33 mm Hg (S).  04/2015 Carotid US IMPRESSION: 1. Moderate right carotid bifurcation atherosclerotic vascular disease. Visually degree of stenosis in the 50-69% range. Elevation of flow velocity ratios also present.  2. Mild left  carotid bifurcation atherosclerotic vascular disease. Degree of stenosis less than 50%.  3. Vertebral arteries are patent with antegrade flow.  Assessment and Plan  1. Valvular heart disease - s/p tissue AVR and MV ring 11/2013 - echo 04/2015 with normal valve function - no recent symptoms, we will continue to monitor. Change lasix to prn only.   2. HTN - bp is at goal, continue current meds  3. Hyperlipidemia - at goal, continue statin.   4. Chronic systolic HF - LVEF has normalized s/p valve surgery and CABG - no significant symptoms - continue to monitor.   5. PAF - . CHADS2Vasc score of 7, continue coumadin. She has also been on ASA due to prior recurrent TIAs - continue current meds      Arnoldo Lenis, M.D.

## 2016-10-08 NOTE — Patient Instructions (Signed)
Your physician wants you to follow-up in: 6 months with Dr Bryna Colander will receive a reminder letter in the mail two months in advance. If you don't receive a letter, please call our office to schedule the follow-up appointment.    Take Lasix 20 mg daily as needed for leg swelling  Only take potassium 10 meq on days you take Lasix     No testing or lab work ordered today.      Thank you for choosing [facility] !

## 2016-10-11 ENCOUNTER — Encounter: Payer: Self-pay | Admitting: Internal Medicine

## 2016-10-11 ENCOUNTER — Ambulatory Visit (INDEPENDENT_AMBULATORY_CARE_PROVIDER_SITE_OTHER): Payer: Medicare Other | Admitting: Internal Medicine

## 2016-10-11 VITALS — BP 110/60 | HR 62 | Temp 98.0°F | Wt 160.0 lb

## 2016-10-11 DIAGNOSIS — I6521 Occlusion and stenosis of right carotid artery: Secondary | ICD-10-CM

## 2016-10-11 DIAGNOSIS — G8929 Other chronic pain: Secondary | ICD-10-CM | POA: Diagnosis not present

## 2016-10-11 DIAGNOSIS — E119 Type 2 diabetes mellitus without complications: Secondary | ICD-10-CM

## 2016-10-11 DIAGNOSIS — I480 Paroxysmal atrial fibrillation: Secondary | ICD-10-CM

## 2016-10-11 DIAGNOSIS — M5441 Lumbago with sciatica, right side: Secondary | ICD-10-CM

## 2016-10-11 DIAGNOSIS — Z23 Encounter for immunization: Secondary | ICD-10-CM

## 2016-10-11 DIAGNOSIS — Z952 Presence of prosthetic heart valve: Secondary | ICD-10-CM

## 2016-10-11 DIAGNOSIS — I10 Essential (primary) hypertension: Secondary | ICD-10-CM

## 2016-10-11 DIAGNOSIS — Z9109 Other allergy status, other than to drugs and biological substances: Secondary | ICD-10-CM | POA: Diagnosis not present

## 2016-10-11 MED ORDER — FLUTICASONE PROPIONATE 50 MCG/ACT NA SUSP: 2.0000 | Freq: Every day | NASAL | 6 refills | Status: DC

## 2016-10-11 MED ORDER — LORATADINE 10 MG PO CAPS: 1.0000 | ORAL_CAPSULE | Freq: Every day | ORAL | 3 refills | Status: DC | PRN

## 2016-10-11 MED ORDER — TETANUS-DIPHTH-ACELL PERTUSSIS 5-2.5-18.5 LF-MCG/0.5 IM SUSP: 0.5000 mL | Freq: Once | INTRAMUSCULAR | 0 refills | Status: AC

## 2016-10-11 MED ORDER — GABAPENTIN 100 MG PO CAPS: 100.0000 mg | ORAL_CAPSULE | Freq: Every day | ORAL | 3 refills | Status: DC

## 2016-10-11 NOTE — Progress Notes (Signed)
Location:  [facility] clinic Provider:  Otilia Kareem L. Mariea Clonts, D.O., C.M.D.  Code Status: full code Goals of Care:  Advanced Directives 10/11/2016  Does Patient Have a Medical Advance Directive? No  Type of Advance Directive -  Does patient want to make changes to medical advance directive? -  Copy of [place] in Chart? -  Would patient like information on creating a medical advance directive? No - Patient declined   Chief Complaint  Patient presents with  . Medical Management of Chronic Issues    30mth follow-up    HPI: Patient is a 76 y.o. female seen today for medical management of chronic diseases.    They brought her bp record which ranges from 540-086 systolic over 76-19 diastolic, but only two in 40s.    Has seen cardiology yesterday.    Lasix was changed to as needed only if weight trends up 3 lbs in 1 day or 5 lbs in a week.  She's been continuing it regularly b/c of filled pillbox.    Weights:  Weight 154 lbs yesterday and 160 lbs here.  Our scale tends to run high.    Sugars:  Also great.  Mostly in 100s to 120s.  hba1c 7.   She traveled to [place] and [place] and ate more goodies than usual.  Would like to lose 10 lbs.  Needs diabetic supplies renewed.  Had a TIA while away.    Having difficulty with the sciatic nerve on right side.  Bottom of buttocks radiating down.  Also has low back on that side.  Aches sitting and it flares up standing.  Also awakens her at night.  Started initially 07/26/15.   Also went 07/21/16 when she had lumbar spine xrays.  She's been doing some weightlifting which has made her joints feel better.  She is also doing silver sneakers.  Ultram at increasing amts affects her the next day and making her sleepy.  Also takes aleve twice a day as needed.  Has had PT after the first ED visit for 6-8 wks.  She does maintain a few of those exercises.    Having allergies and headaches lately.  Uses benadryl or aleve pm.  Discussed that these are probably  causing the hangover.  She does not like the 24 hr loratadine.    Sleeping difficulty but due to above concerns.    Valvular disease s/p AVR:  On coumadin therapy for this and afib.  Monitored at cardiology clinic  HTN:  bp satisfactory most of the time.  No recent palpitations requiring lopressor extra 1/2 tab.  Has not needed imodium very often for loose stools.  Counseled on metformin being with food faithfully.   Lipitor for hyperlipidemia and CAD. Also on baby asa.    Was told by ophtho she could stop preservision.  No longer considered helpful.  Vision worsening.     MMSE - Mini Mental State Exam 10/04/2016 12/06/2015  Orientation to time 5 5  Orientation to Place 5 4  Registration 3 3  Attention/ Calculation 5 5  Recall 2 3  Language- name 2 objects 2 2  Language- repeat 1 1  Language- follow 3 step command 2 3  Language- read & follow direction 1 1  Write a sentence 1 1  Copy design 1 0  Copy design-comments - Patient has poor visual acuity.  Total score 28 28    Past Medical History:  Diagnosis Date  . Adjustment disorder 10/17/2005  . Atrial  fibrillation ([place])   . Cataract    bilateral  . CHF (congestive heart failure) ([place])   . Contracture of knee joint 07/26/2009  . Coronary artery disease   . Degeneration of lumbar or lumbosacral intervertebral disc 07/26/2009  . Diabetes mellitus without complication ([place])   . Esophageal spasm   . Esophageal spasm   . Fibromyalgia   . Fibromyalgia   . Hearing loss 12/18/2010  . Heart murmur   . Hypercholesteremia   . Hypertension   . 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 Leaky heart valve   . Lyme disease   . Macular degeneration   . Macular degeneration of both eyes   . Migraine 10/17/2005  . Mixed incontinence 10/17/2005  . Osteoarthritis    multiple joints   . Overweight 09/19/2010  . Pneumonia   . Postartificial menopausal syndrome 10/10/2009  . Prinzmetal angina ([place]) 10/17/2005    . Psoriasis 06/12/2006  . Sciatica   . TIA (transient ischemic attack)   . Type II diabetes mellitus ([place])   . [name] 10/17/2005    Past Surgical History:  Procedure Laterality Date  . ABDOMINAL HYSTERECTOMY    . AORTIC VALVE REPLACEMENT  12/10/2013  . CHOLECYSTECTOMY    . CORONARY ARTERY BYPASS GRAFT    . esophageal repair surgery    . FRACTURE SURGERY     wrist  . JOINT REPLACEMENT    . MITRAL VALVE REPAIR    . NISSEN FUNDOPLICATION    . REPLACEMENT TOTAL KNEE BILATERAL      Allergies  Allergen Reactions  . Penicillins Anaphylaxis    Has patient had a PCN reaction causing immediate rash, facial/tongue/throat swelling, SOB or lightheadedness with hypotension: Yes Has patient had a PCN reaction causing severe rash involving mucus membranes or skin necrosis: Yes Has patient had a PCN reaction that required hospitalization Yes Has patient had a PCN reaction occurring within the last 10 years: No If all of the above answers are "NO", then may proceed with Cephalosporin use.   . Latex   . Morphine And Related   . Tape   . Iodine     Allergies as of 10/11/2016      Reactions   Penicillins Anaphylaxis   Has patient had a PCN reaction causing immediate rash, facial/tongue/throat swelling, SOB or lightheadedness with hypotension: Yes Has patient had a PCN reaction causing severe rash involving mucus membranes or skin necrosis: Yes Has patient had a PCN reaction that required hospitalization Yes Has patient had a PCN reaction occurring within the last 10 years: No If all of the above answers are "NO", then may proceed with Cephalosporin use.   Latex    Morphine And Related    Tape    Iodine       Medication List       Accurate as of 10/11/16 10:14 AM. Always use your most recent med list.          acetaminophen 500 MG tablet Commonly known as:  TYLENOL Take 1,000 mg by mouth every 8 (eight) hours as needed.   aspirin EC 81 MG tablet Take 81 mg by mouth every  morning. 8 am   atorvastatin 80 MG tablet Commonly known as:  LIPITOR Take 1 tablet (80 mg total) by mouth daily.   diphenhydrAMINE 25 MG tablet Commonly known as:  BENADRYL Take 25 mg by mouth every 6 (six) hours as needed.   FORACARE PREMIUM V10 Devi Use to test blood sugar  three times daily. Dx: E11.9   furosemide 20 MG tablet Commonly known as:  LASIX Take 20 mg daily as needed for leg swelling   ibuprofen 200 MG tablet Commonly known as:  ADVIL,MOTRIN Take 200 mg by mouth every 6 (six) hours as needed.   IBUPROFEN PM 200-38 MG Tabs Generic drug:  Ibuprofen-Diphenhydramine Cit Take 2 capsules by mouth at bedtime as needed.   IMODIUM A-D 2 MG tablet Generic drug:  loperamide Take 2 mg by mouth as needed for diarrhea or loose stools.   Loratadine 10 MG Caps Take 1 capsule by mouth daily as needed.   metFORMIN 500 MG tablet Commonly known as:  GLUCOPHAGE Take 1 tablet (500 mg total) by mouth 2 (two) times daily with a meal. 8am and 7pm   metoprolol tartrate 25 MG tablet Commonly known as:  LOPRESSOR Take 0.5 tablets (12.5 mg total) by mouth 2 (two) times daily. 8 am & 7 pm   naproxen sodium 220 MG tablet Commonly known as:  ANAPROX Take 220 mg by mouth 2 (two) times daily as needed.   potassium chloride 10 MEQ tablet Commonly known as:  K-DUR Take potassium 10 meq only on days you take lasix   Tdap 5-2.5-18.5 LF-MCG/0.5 injection Commonly known as:  BOOSTRIX Inject 0.5 mLs into the muscle once.   traMADol 50 MG tablet Commonly known as:  ULTRAM Take 1 tablet (50 mg total) by mouth every 6 (six) hours as needed.   TRUEDRAW LANCING DEVICE Misc Use as Directed   valsartan 320 MG tablet Commonly known as:  DIOVAN Take 1 tablet (320 mg total) by mouth daily.   warfarin 6 MG tablet Commonly known as:  COUMADIN Take 1 tablet (6 mg total) by mouth daily.       Review of Systems:  Review of Systems  Constitutional: Negative for chills and fever.        Says she has lost weight, but our scale is not showing   HENT: Negative for congestion and hearing loss.   Eyes: Positive for blurred vision.  Respiratory: Negative for cough and shortness of breath.   Cardiovascular: Negative for chest pain, palpitations and leg swelling.  Gastrointestinal: Negative for abdominal pain.  Genitourinary: Negative for dysuria.  Musculoskeletal: Positive for back pain and myalgias. Negative for falls.  Skin: Negative for itching and rash.  Neurological: Positive for tingling and sensory change. Negative for dizziness and loss of consciousness.  Psychiatric/Behavioral: Positive for memory loss.       MCI, seems to rely on her daughter to answer a lot and daughter has charts of mom's results and extensive questions over a page in length    Health Maintenance  Topic Date Due  . MAMMOGRAM  10/31/1992  . DEXA SCAN  11/01/2007  . TETANUS/TDAP  08/14/2016  . OPHTHALMOLOGY EXAM  10/10/2016  . INFLUENZA VACCINE  10/24/2016  . PNA vac Low Risk Adult (2 of 2 - PPSV23) 10/31/2016  . FOOT EXAM  12/05/2016  . HEMOGLOBIN A1C  04/06/2017  . COLONOSCOPY  03/26/2022    Physical Exam: Vitals:   10/11/16 1001  BP: 110/60  Pulse: 62  Temp: 98 F (36.7 C)  TempSrc: Oral  SpO2: 96%  Weight: 160 lb (72.6 kg)   Body mass index is 29.26 kg/m. Physical Exam  Constitutional: She is oriented to person, place, and time. She appears well-developed and well-nourished. No distress.  HENT:  Head: Normocephalic and atraumatic.  Cardiovascular:  Murmur heard. irreg irreg  Pulmonary/Chest:  Effort normal and breath sounds normal. No respiratory distress.  Abdominal: Soft. Bowel sounds are normal. She exhibits no distension.  Musculoskeletal: She exhibits tenderness.  Over paraspinal muscles of lumbar region and into buttock on right  Neurological: She is alert and oriented to person, place, and time.  Skin: Skin is warm and dry. Capillary refill takes less than 2 seconds.    Psychiatric: She has a normal mood and affect.    Labs reviewed: Basic Metabolic Panel:  Recent Labs  12/02/15 0933 12/26/15 1453 04/13/16 1132 10/04/16 0958  NA 141 139 139 139  K 4.2 4.0 4.6 4.5  CL 102 101 102 103  CO2 28 22 31 28   GLUCOSE 115* 94 85 128*  BUN 20 28* 23 20  CREATININE 0.77 1.53* 0.88 0.85  CALCIUM 9.7 9.3 9.7 9.5  TSH 0.70  --   --   --    Liver Function Tests:  Recent Labs  12/26/15 1453 04/13/16 1132 10/04/16 0958  AST 22 20 19   ALT 18 16 17   ALKPHOS 106 91 99  BILITOT 0.4 0.5 0.5  PROT 7.0 7.0 6.8  ALBUMIN 4.0 4.2 4.0    Recent Labs  12/26/15 1453  LIPASE 99*  AMYLASE 72   No results for input(s): AMMONIA in the last 8760 hours. CBC:  Recent Labs  12/26/15 1453 04/13/16 1132 10/04/16 0958  WBC 11.4* 9.3 8.8  NEUTROABS 8,208* 6,231 5,896  HGB 13.0 12.6 12.4  HCT 39.0 39.5 37.8  MCV 83.3 85.1 85.9  PLT 433* 409* 394   Lipid Panel:  Recent Labs  10/04/16 0958  CHOL 133  HDL 53  LDLCALC 57  TRIG 116  CHOLHDL 2.5   Lab Results  Component Value Date   HGBA1C 7.0 (H) 10/04/2016   Reviewed imaging studies from her ED visits and notes  Assessment/Plan 1. Chronic right-sided low back pain with right-sided sciatica -worse recently, advised to resume her exercises previously provided by PT, cont light exercise, use tylenol for pain and try gabapentin 100mg  at hs -if pain worse or not improved with that and still struggling to sleep at hs due to pain, will increase dose  2. Type 2 diabetes mellitus without complication, without long-term current use of insulin (HCC) - control has been fair, cont baby asa, statin, metformin ALWAYS with food - Hemoglobin A1c; Future - Basic metabolic panel; Future  3. Paroxysmal atrial fibrillation (HCC) -cont metoprolol, coumadin per cardiology  4. S/P AVR (aortic valve replacement) -follows with cardiology for echos to monitor and on coumadin through them  5. Essential  hypertension - bp at goal with current regimen as above, cont same and monitor - Basic metabolic panel; Future  6. Environmental allergies -advised against aleve pm and benadryl use due to her daytime sleepiness and lack of benefit anyway -use flonase and loratadine instead when needed  7. Need for Tdap vaccination -Rx provided to pharmacy for tetanus booster - Tdap ([place]) 5-2.5-18.5 LF-MCG/0.5 injection; Inject 0.5 mLs into the muscle once.  Dispense: 0.5 mL; Refill: 0  Counseled on importance of coming for more frequent check-ups so we do not have over an hour's worth of material to cover in a 30 min scheduled appt--spent over an hour with pt and her daughter today.  See pt AVS instructions.  Labs/tests ordered:   Orders Placed This Encounter  Procedures  . Hemoglobin A1c    Standing Status:   Future    Standing Expiration Date:   06/11/2017  . Basic  metabolic panel    Standing Status:   Future    Standing Expiration Date:   06/11/2017    Order Specific Question:   Has the patient fasted?    Answer:   Yes    Next appt:  02/11/2017 med mgt, labs before   [place] Joreen Swearingin, D.O. [facility] [address], [place] [address] Cell Phone (Mon-Fri 8am-5pm):  [phone] On Call:  [phone] & follow prompts after 5pm & weekends Office Phone:  [phone] Office Fax:  [phone]

## 2016-10-11 NOTE — Patient Instructions (Addendum)
Sciatica:  Use gabapentin at bedtime.  Do exercises from PT.    Stop benadryl and PM medications.  These are dangerous at your age due to effects on balance, memory, and hangover effects.  Try loratadine and flonase.    Hold off on tramadol while trying the gabapentin.  Let me know about how this goes.    Check with pharmacy about valsartan recall (diovan).

## 2016-10-12 DIAGNOSIS — H353221 Exudative age-related macular degeneration, left eye, with active choroidal neovascularization: Secondary | ICD-10-CM | POA: Diagnosis not present

## 2016-10-12 DIAGNOSIS — H353212 Exudative age-related macular degeneration, right eye, with inactive choroidal neovascularization: Secondary | ICD-10-CM | POA: Diagnosis not present

## 2016-10-12 DIAGNOSIS — H2513 Age-related nuclear cataract, bilateral: Secondary | ICD-10-CM | POA: Diagnosis not present

## 2016-10-12 DIAGNOSIS — H43813 Vitreous degeneration, bilateral: Secondary | ICD-10-CM | POA: Diagnosis not present

## 2016-10-15 ENCOUNTER — Telehealth: Payer: Self-pay | Admitting: *Deleted

## 2016-10-15 ENCOUNTER — Encounter: Payer: Self-pay | Admitting: Internal Medicine

## 2016-10-15 NOTE — Telephone Encounter (Signed)
Spoke with daughter and advised results. She understood the results and does not need a refill at this time. daughter will call back in a few days and let us know how pt is doing.

## 2016-10-15 NOTE — Telephone Encounter (Signed)
Ok to increase gabapentin to 100mg  in the morning and 200mg  at bedtime.  Please change in directions.  She should use heat on her back and do the stretches she got from PT before.  If she is still struggling with pain, we can refer her to outpatient physical therapy.

## 2016-10-15 NOTE — Telephone Encounter (Signed)
Patient daughter, [name] called and stated that patient hurt her back over the weekend and now having increased sciatica pain. Daughter is wanting the Gabapentin increased to more than one at bedtime. Stated that patient needs it when she gets up also. Please Advise.

## 2016-10-16 ENCOUNTER — Encounter: Payer: Self-pay | Admitting: Internal Medicine

## 2016-10-16 ENCOUNTER — Encounter: Payer: Medicare Other | Attending: Internal Medicine | Admitting: *Deleted

## 2016-10-16 DIAGNOSIS — Z6829 Body mass index (BMI) 29.0-29.9, adult: Secondary | ICD-10-CM | POA: Insufficient documentation

## 2016-10-16 DIAGNOSIS — Z713 Dietary counseling and surveillance: Secondary | ICD-10-CM | POA: Insufficient documentation

## 2016-10-16 DIAGNOSIS — E119 Type 2 diabetes mellitus without complications: Secondary | ICD-10-CM | POA: Insufficient documentation

## 2016-10-18 ENCOUNTER — Other Ambulatory Visit: Payer: Self-pay | Admitting: Internal Medicine

## 2016-10-18 MED ORDER — LOSARTAN POTASSIUM 100 MG PO TABS: 100.0000 mg | ORAL_TABLET | Freq: Every day | ORAL | 3 refills | Status: DC

## 2016-10-18 NOTE — Progress Notes (Signed)
Diabetes Self-Management Education  Visit Type: First/Initial  Appt. Start Time: 1530 Appt. End Time: 1700  10/18/2016  Jeanette Yates, identified by name and date of birth, is a 76 y.o. female with a diagnosis of Diabetes: Type 2. She is here with her daughter who assists in her care. Patient states she has macular degeneration and needs assistance in reading small print. She verbalized excellent understanding of nutrition guidelines she learned about 30 years ago. She is monitoring her carbohydrate, protein and fat grams routinely. She goes to the gym several days a week with her daughter and gets a mix of cardio and weight resistance exercise. She would like a new form to record her food and BG daily.   ASSESSMENT  Height 5\' 2"  (1.575 m), weight 159 lb 1.6 oz (72.2 kg). Body mass index is 29.1 kg/m.      Diabetes Self-Management Education - 10/16/16 1549      Visit Information   Visit Type First/Initial     Initial Visit   Diabetes Type Type 2   Are you currently following a meal plan? Yes   What type of meal plan do you follow? Diabetic   Are you taking your medications as prescribed? Yes   Date Diagnosed 30 years ago     Health Coping   How would you rate your overall health? Good     Psychosocial Assessment   Patient Belief/Attitude about Diabetes Defeat/Burnout   Self-care barriers None   Self-management support Family   Other persons present Patient;Family Member  daughter who assists with her care   Patient Concerns Nutrition/Meal planning;Glycemic Control   Special Needs Large print   What is the last grade level you completed in school? Masters Degree     Pre-Education Assessment   Patient understands the diabetes disease and treatment process. Needs Review   Patient understands incorporating nutritional management into lifestyle. Needs Review   Patient undertands incorporating physical activity into lifestyle. Demonstrates understanding / competency   Patient understands using medications safely. Needs Review   Patient understands monitoring blood glucose, interpreting and using results Demonstrates understanding / competency   Patient understands prevention, detection, and treatment of chronic complications. Needs Review   Patient understands how to develop strategies to address psychosocial issues. Needs Review   Patient understands how to develop strategies to promote health/change behavior. Demonstrates understanding / competency     Complications   Last HgB A1C per patient/outside source 7 %   How often do you check your blood sugar? 3-4 times / week   Fasting Blood glucose range (mg/dL) 70-129;130-179   Postprandial Blood glucose range (mg/dL) 130-179   Number of hypoglycemic episodes per month 0   Have you had a dilated eye exam in the past 12 months? Yes   Have you had a dental exam in the past 12 months? Yes   Are you checking your feet? Yes   How many days per week are you checking your feet? 1     Exercise   Exercise Type Light (walking / raking leaves)   How many days per week to you exercise? 3   How many minutes per day do you exercise? 60   Total minutes per week of exercise 180     Patient Education   Previous Diabetes Education Yes (please comment)  15 and 30 years ago   Disease state  Definition of diabetes, type 1 and 2, and the diagnosis of diabetes   Nutrition management  Role of  diet in the treatment of diabetes and the relationship between the three main macronutrients and blood glucose level;Food label reading, portion sizes and measuring food.;Carbohydrate counting   Physical activity and exercise  Role of exercise on diabetes management, blood pressure control and cardiac health.   Medications Reviewed patients medication for diabetes, action, purpose, timing of dose and side effects.   Monitoring Identified appropriate SMBG and/or A1C goals.   Chronic complications Relationship between chronic  complications and blood glucose control   Psychosocial adjustment Worked with patient to identify barriers to care and solutions     Individualized Goals (developed by patient)   Nutrition Follow meal plan discussed   Physical Activity Exercise 3-5 times per week   Medications take my medication as prescribed   Monitoring  test blood glucose pre and post meals as discussed   Reducing Risk examine blood glucose patterns     Post-Education Assessment   Patient understands the diabetes disease and treatment process. Demonstrates understanding / competency   Patient understands incorporating nutritional management into lifestyle. Demonstrates understanding / competency   Patient undertands incorporating physical activity into lifestyle. Demonstrates understanding / competency   Patient understands using medications safely. Demonstrates understanding / competency   Patient understands monitoring blood glucose, interpreting and using results Demonstrates understanding / competency   Patient understands prevention, detection, and treatment of chronic complications. Demonstrates understanding / competency     Outcomes   Expected Outcomes Demonstrated interest in learning. Expect positive outcomes   Future DMSE 4-6 wks   Program Status Completed      Individualized Plan for Diabetes Self-Management Training:   Learning Objective:  Patient will have a greater understanding of diabetes self-management. Patient education plan is to attend individual and/or group sessions per assessed needs and concerns.   Plan:   Patient Instructions  Plan:  Aim for 2-3 Carb Choices per meal (30-45 grams)   Aim for 0-1 Carbs per snack if hungry  Include protein in moderation with your meals and snacks Consider reading food labels for Total Carbohydrate of foods Continue with your activity level daily as tolerated Consider checking BG at alternate times per day   Consider taking medication as directed by  MD  Expected Outcomes:  Demonstrated interest in learning. Expect positive outcomes  Education material provided: Living Well with Diabetes, A1C conversion sheet, Meal plan card and Carbohydrate counting sheet  If problems or questions, patient to contact team via:  Phone  Future DSME appointment: 4-6 wks

## 2016-10-18 NOTE — Patient Instructions (Signed)
Plan:  Aim for 2-3 Carb Choices per meal (30-45 grams)   Aim for 0-1 Carbs per snack if hungry  Include protein in moderation with your meals and snacks Consider reading food labels for Total Carbohydrate of foods Continue with your activity level daily as tolerated Consider checking BG at alternate times per day   Consider taking medication as directed by MD    

## 2016-10-23 ENCOUNTER — Telehealth: Payer: Self-pay | Admitting: *Deleted

## 2016-10-23 MED ORDER — GABAPENTIN 100 MG PO CAPS: 100.0000 mg | ORAL_CAPSULE | ORAL | 0 refills | Status: DC

## 2016-10-23 NOTE — Telephone Encounter (Signed)
Patient daughter, [name] called and stated that they would like patient's Gabapentin adjusted to work more effectively. Stated that the sciatica problem has came down and no trips to the ER. Working with her with PT exercises. Has dropped the Gabapentin back down to One at bedtime. Patient is still waking up at 1-2 in the morning with pain. Gabapentin is helping with the pain but wears off too soon. Gives the Gabapentin to patient at 9:00pm and the Melatonin. Not getting a good night sleep because she is not getting to sleep right off and then she is waking up around 1-2am with pain. Please Advise.

## 2016-10-23 NOTE — Telephone Encounter (Signed)
Ok, she was to be taking 2 of the gabapentin at bedtime.  Why did it go back to just one?  She may take 3 of the 100mg  gabapentin at bedtime.  Please change in med list and be sure they don't need a refill.

## 2016-10-23 NOTE — Telephone Encounter (Signed)
[name] notified and agreed. Stated that they decreased to one Gabapentin at bedtime because the Sciatica pain was under control. Will increase it back to 2-3 at bedtime.

## 2016-10-29 ENCOUNTER — Other Ambulatory Visit: Payer: Self-pay | Admitting: *Deleted

## 2016-10-29 ENCOUNTER — Telehealth: Payer: Self-pay | Admitting: *Deleted

## 2016-10-29 MED ORDER — GABAPENTIN 100 MG PO CAPS: 100.0000 mg | ORAL_CAPSULE | ORAL | 1 refills | Status: DC

## 2016-10-29 NOTE — Telephone Encounter (Signed)
Patient daughter requested Rx to be faxed to [facility].

## 2016-10-29 NOTE — Telephone Encounter (Signed)
Error/tg °

## 2016-11-01 ENCOUNTER — Ambulatory Visit (INDEPENDENT_AMBULATORY_CARE_PROVIDER_SITE_OTHER): Payer: Medicare Other | Admitting: *Deleted

## 2016-11-01 DIAGNOSIS — I6521 Occlusion and stenosis of right carotid artery: Secondary | ICD-10-CM

## 2016-11-01 DIAGNOSIS — Z5181 Encounter for therapeutic drug level monitoring: Secondary | ICD-10-CM | POA: Diagnosis not present

## 2016-11-01 DIAGNOSIS — Z8673 Personal history of transient ischemic attack (TIA), and cerebral infarction without residual deficits: Secondary | ICD-10-CM | POA: Diagnosis not present

## 2016-11-01 DIAGNOSIS — I4891 Unspecified atrial fibrillation: Secondary | ICD-10-CM | POA: Diagnosis not present

## 2016-11-01 LAB — POCT INR: INR: 2.9

## 2016-11-07 ENCOUNTER — Other Ambulatory Visit: Payer: Self-pay | Admitting: *Deleted

## 2016-11-07 MED ORDER — GABAPENTIN 100 MG PO CAPS: 100.0000 mg | ORAL_CAPSULE | ORAL | 1 refills | Status: DC

## 2016-11-07 NOTE — Telephone Encounter (Signed)
[employer]

## 2016-11-12 ENCOUNTER — Other Ambulatory Visit: Payer: Self-pay | Admitting: Internal Medicine

## 2016-11-12 ENCOUNTER — Other Ambulatory Visit: Payer: Self-pay

## 2016-11-12 DIAGNOSIS — E2839 Other primary ovarian failure: Secondary | ICD-10-CM

## 2016-11-15 ENCOUNTER — Ambulatory Visit
Admission: RE | Admit: 2016-11-15 | Discharge: 2016-11-15 | Disposition: A | Discharge status: Home / Self Care | Payer: Medicare Other | Source: Ambulatory Visit | Attending: Internal Medicine | Admitting: Internal Medicine

## 2016-11-15 ENCOUNTER — Other Ambulatory Visit: Payer: Medicare Other

## 2016-11-15 ENCOUNTER — Ambulatory Visit (INDEPENDENT_AMBULATORY_CARE_PROVIDER_SITE_OTHER): Payer: Medicare Other | Admitting: Gastroenterology

## 2016-11-15 ENCOUNTER — Encounter: Payer: Self-pay | Admitting: Gastroenterology

## 2016-11-15 VITALS — BP 124/65 | HR 61 | Temp 97.8°F | Ht 62.0 in | Wt 159.6 lb

## 2016-11-15 DIAGNOSIS — I6521 Occlusion and stenosis of right carotid artery: Secondary | ICD-10-CM

## 2016-11-15 DIAGNOSIS — R1310 Dysphagia, unspecified: Secondary | ICD-10-CM | POA: Insufficient documentation

## 2016-11-15 DIAGNOSIS — Z780 Asymptomatic menopausal state: Secondary | ICD-10-CM | POA: Diagnosis not present

## 2016-11-15 DIAGNOSIS — M8589 Other specified disorders of bone density and structure, multiple sites: Secondary | ICD-10-CM | POA: Diagnosis not present

## 2016-11-15 DIAGNOSIS — E2839 Other primary ovarian failure: Secondary | ICD-10-CM

## 2016-11-15 DIAGNOSIS — Z1231 Encounter for screening mammogram for malignant neoplasm of breast: Secondary | ICD-10-CM | POA: Diagnosis not present

## 2016-11-15 DIAGNOSIS — Z1239 Encounter for other screening for malignant neoplasm of breast: Secondary | ICD-10-CM

## 2016-11-15 NOTE — Patient Instructions (Signed)
1. I will review all your records. Further recommendations to follow.

## 2016-11-15 NOTE — Progress Notes (Signed)
Primary Care Physician:  Gayland Curry, DO Referring Provider: Bunnie Domino, NP Primary Gastroenterologist:  Garfield Cornea, MD   Chief Complaint  Patient presents with  . Dysphagia    HPI:  Jeanette Yates is a 76 y.o. female here at the request of cardiology, Bunnie Domino, NP for further evaluation of dysphagia. Patient was hospitalized in June, when she went to visit family out of state, for possible TIA. While she was hospitalized she had speech therapy evaluation. Modified barium swallow study was performed. She had mild oropharyngeal phase dysphagia. She had intermittent use of repeat swallow, with slightly reduced bolused cohesion noted, especially with mixed solid textures. Slight post swallow residuals along the posterior tongue base requiring second swallow to clear. Pharyngeal phase characterized by intermittent delay in triggering the pharyngeal swallow reflex. Reduced apical glottic retroflexion. Transient penetration with thin liquids only during the swallow, was not consistent and completely clear the airway upon completion of the swallow. No aspiration. Mild post-swallow residual is in the vallecula a at times spilling to appear formed sinus, repeat independent dries swallowing effective in clearing this residue.  Patient has an extensive GI history of dysphagia. She's had multiple dilations in the past, last one around 2015. See past surgical history for details. She has remote open Nissan fundoplication in the 44R. Has noted to be intact on all prior EGDs, hypertensive LES.   Patient states that previous esophageal dilations didn't really seem to help. She feels like food comes up and sits in her chest, difficult to get it to go down or bring it up. Has coughing fits associated with it. Episodes like this happen about 2-3 times per year. On a daily basis she feels like large pills and food are in the epigastric region. Denies heartburn or reflux. Has not been on PPI in  years. Reports diarrhea about once per week. Otherwise has several soft bowel movements daily. No melena rectal bleeding. Denies abdominal pain unless she presses in the epigastric region. No weight loss.   Current Outpatient Prescriptions  Medication Sig Dispense Refill  . acetaminophen (TYLENOL) 500 MG tablet Take 1,000 mg by mouth every 8 (eight) hours as needed.     Marland Kitchen aspirin EC 81 MG tablet Take 81 mg by mouth every morning. 8 am    . atorvastatin (LIPITOR) 80 MG tablet Take 1 tablet (80 mg total) by mouth daily. 90 tablet 3  . Blood Glucose Monitoring Suppl (FORACARE PREMIUM V10) DEVI Use to test blood sugar three times daily. Dx: E11.9 1 Device 0  . fluticasone (FLONASE) 50 MCG/ACT nasal spray Place 2 sprays into both nostrils daily. 16 g 6  . furosemide (LASIX) 20 MG tablet Take 20 mg daily as needed for leg swelling 90 tablet 3  . gabapentin (NEURONTIN) 100 MG capsule Take 1 capsule (100 mg total) by mouth every morning. Take 2-3 tablets by mouth at bedtime 360 capsule 1  . Lancet Devices (TRUEDRAW LANCING DEVICE) MISC Use as Directed 1 each 0  . loperamide (IMODIUM A-D) 2 MG tablet Take 2 mg by mouth as needed for diarrhea or loose stools.    . Loratadine 10 MG CAPS Take 1 capsule (10 mg total) by mouth daily as needed. 30 each 3  . losartan (COZAAR) 100 MG tablet Take 1 tablet (100 mg total) by mouth daily. 90 tablet 3  . metFORMIN (GLUCOPHAGE) 500 MG tablet Take 1 tablet (500 mg total) by mouth 2 (two) times daily with a meal. 8am and 7pm  180 tablet 3  . metoprolol tartrate (LOPRESSOR) 25 MG tablet Take 0.5 tablets (12.5 mg total) by mouth 2 (two) times daily. 8 am & 7 pm (Patient taking differently: Take 12.5 mg by mouth 2 (two) times daily. 8 am & 7 pm) 180 tablet 3  . potassium chloride (K-DUR) 10 MEQ tablet Take potassium 10 meq only on days you take lasix 90 tablet 3  . warfarin (COUMADIN) 6 MG tablet Take 1 tablet (6 mg total) by mouth daily. 90 tablet 3   No current  facility-administered medications for this visit.     Allergies as of 11/15/2016 - Review Complete 11/15/2016  Allergen Reaction Noted  . Penicillins Anaphylaxis 05/02/2015  . Latex  11/15/2015  . Morphine and related  07/26/2015  . Tape  07/26/2015  . Iodine  11/15/2015    Past Medical History:  Diagnosis Date  . Adjustment disorder 10/17/2005  . Atrial fibrillation ([place])   . Cataract    bilateral  . CHF (congestive heart failure) ([place])   . Contracture of knee joint 07/26/2009  . Coronary artery disease   . Degeneration of lumbar or lumbosacral intervertebral disc 07/26/2009  . Diabetes mellitus without complication ([place])   . Esophageal spasm   . Esophageal spasm   . Fibromyalgia   . Fibromyalgia   . Hearing loss 12/18/2010  . Heart murmur   . Hypercholesteremia   . Hypertension   . 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 Leaky heart valve   . Lyme disease   . Macular degeneration   . Macular degeneration of both eyes   . Migraine 10/17/2005  . Mixed incontinence 10/17/2005  . Osteoarthritis    multiple joints   . Overweight 09/19/2010  . Pneumonia   . Postartificial menopausal syndrome 10/10/2009  . Prinzmetal angina ([place]) 10/17/2005  . Psoriasis 06/12/2006  . Sciatica   . TIA (transient ischemic attack)   . Type II diabetes mellitus ([place])   . [name] 10/17/2005    Past Surgical History:  Procedure Laterality Date  . ABDOMINAL HYSTERECTOMY    . AORTIC VALVE REPLACEMENT  12/10/2013   porcine  . CHOLECYSTECTOMY  1981  . COLONOSCOPY  04/18/2006   Dr. Nelva Nay: internal hemorrhoids  . COLONOSCOPY  10/2003   Dr. Alphonsa Gin: hemorrhoids  . CORONARY ARTERY BYPASS GRAFT  2015  . ESOPHAGOGASTRODUODENOSCOPY  10/16/2013   Dr. Marla Roe: prior Nissen fundoplication intact, hypertonic LES, dilated up to 31 [place] with moderate resistance, gastritis but no H. pylori., Reactive gastritis, no celiac disease.  . ESOPHAGOGASTRODUODENOSCOPY   05/29/2012   Dr. Doy Mince: Moderately severe esophagitis, acute gastritis reactive, no H pylori, no celiac. Esophageal biopsies consistent with GERD, no Barrett  . ESOPHAGOGASTRODUODENOSCOPY  03/21/2009   Dr. Doy Mince: Reflux esophagitis, gastritis without H. pylori, esophagus stretched 57 savory  . ESOPHAGOGASTRODUODENOSCOPY  02/20/2008   Dr. Doy Mince: Esophagus dilated to 70 French, reactive gastropathy with no H pylori. No Barrett's on esophageal biopsy  . ESOPHAGOGASTRODUODENOSCOPY  09/24/2006   Dr. Doy Mince: Tight wrap noted, reactive gastropathy, no Barrett's  . FRACTURE SURGERY     wrist  . JOINT REPLACEMENT    . MITRAL VALVE REPAIR    . NISSEN FUNDOPLICATION  8676  . REPLACEMENT TOTAL KNEE BILATERAL  2010/2013    Family History  Problem Relation Age of Onset  . Stroke Mother   . Heart failure Mother   . Heart disease Mother   . Mitral valve prolapse Mother   . Diabetes Father   .  Heart attack Father   . Stroke Father   . Heart disease Father   . Hypertension Father   . Alzheimer's disease Father   . Stroke Maternal Grandmother   . Arthritis Maternal Grandfather        hands  . Breast cancer Paternal Grandmother   . Osteoporosis Paternal Grandmother   . Breast cancer Cousin   . Colon cancer Neg Hx     Social History   Social History  . Marital status: Widowed    Spouse name: N/A  . Number of children: 3  . Years of education: N/A   Occupational History  . Not on file.   Social History Main Topics  . Smoking status: Former Smoker    Packs/day: 1.00    Years: 20.00    Types: Cigarettes    Quit date: 09/12/1966  . Smokeless tobacco: Never Used  . Alcohol use No  . Drug use: No  . Sexual activity: No   Other Topics Concern  . Not on file   Social History Narrative  . No narrative on file      ROS:  General: Negative for anorexia, weight loss, fever, chills, fatigue, weakness. Eyes: Negative for vision changes.  ENT: Negative for hoarseness,  ,  nasal congestion. See hpi CV: Negative for chest pain, angina, palpitations, dyspnea on exertion, peripheral edema.  Respiratory: Negative for dyspnea at rest, dyspnea on exertion, cough, sputum, wheezing.  GI: See history of present illness. GU:  Negative for dysuria, hematuria, urinary incontinence, urinary frequency, nocturnal urination.  MS: Negative for joint pain, low back pain.  Derm: Negative for rash or itching.  Neuro: Negative for weakness, abnormal sensation, seizure, frequent headaches, memory loss, confusion.  Psych: Negative for anxiety, depression, suicidal ideation, hallucinations.  Endo: Negative for unusual weight change.  Heme: Negative for bruising or bleeding. Allergy: Negative for rash or hives.    Physical Examination:  BP 124/65   Pulse 61   Temp 97.8 F (36.6 C) (Oral)   Ht 5\' 2"  (1.575 m)   Wt 159 lb 9.6 oz (72.4 kg)   BMI 29.19 kg/m    General: Well-nourished, well-developed in no acute distress. Accompanied by daughter in law. Head: Normocephalic, atraumatic.   Eyes: Conjunctiva pink, no icterus. Mouth: Oropharyngeal mucosa moist and pink , no lesions erythema or exudate. Neck: Supple without thyromegaly, masses, or lymphadenopathy.  Lungs: Clear to auscultation bilaterally.  Heart: Regular rate and rhythm, no murmurs rubs or gallops.  Abdomen: Bowel sounds are normal, nontender, nondistended, no hepatosplenomegaly or masses, no abdominal bruits or    hernia , no rebound or guarding.   Rectal: not performed Extremities: No lower extremity edema. No clubbing or deformities.  Neuro: Alert and oriented x 4 , grossly normal neurologically.  Skin: Warm and dry, no rash or jaundice.   Psych: Alert and cooperative, normal mood and affect.  Labs: Lab Results  Component Value Date   INR 2.9 11/01/2016   Lab Results  Component Value Date   CREATININE 0.85 10/04/2016   BUN 20 10/04/2016   NA 139 10/04/2016   K 4.5 10/04/2016   CL 103 10/04/2016    CO2 28 10/04/2016   Lab Results  Component Value Date   ALT 17 10/04/2016   AST 19 10/04/2016   ALKPHOS 99 10/04/2016   BILITOT 0.5 10/04/2016   Lab Results  Component Value Date   WBC 8.8 10/04/2016   HGB 12.4 10/04/2016   HCT 37.8 10/04/2016   MCV  85.9 10/04/2016   PLT 394 10/04/2016   Lab Results  Component Value Date   HGBA1C 7.0 (H) 10/04/2016     Imaging Studies: Dg Bone Density  Result Date: 11/15/2016 EXAM: DUAL X-RAY ABSORPTIOMETRY (DXA) FOR BONE MINERAL DENSITY IMPRESSION: Referring Physician:  Rexene Edison REED PATIENT: Name: Delorice, Bannister Patient ID: [id number] Birth Date: [birth year] Height: 62.0 in. Sex: Female Measured: 11/15/2016 Weight: 161.0 lbs. Indications: Advanced Age, Bilateral Ovariectomy (65.51), Caucasian, Estrogen Deficient, Gabapentin, History of Fracture (Adult) (V15.51), Hx of tobacco use, Hysterectomy, Low Calcium Intake (269.3), Postmenopausal Fractures: Foot, Left wrist Treatments: None ASSESSMENT: The BMD measured at Forearm Radius 33% is 0.684 g/cm2 with a T-score of -2.3. This patient is considered osteopenic according to [facility]) criteria. Lumbar spine was not utilized due to advanced degenerative changes. Site Region Measured Date Measured Age YA BMD Significant CHANGE T-score Right Forearm Radius 33% 11/15/2016 76.0 -2.3 0.684 g/cm2 DualFemur Neck Right 11/15/2016 74.0 -1.9 0.777 g/cm2 World Health [facility]) criteria for post-menopausal, Caucasian Women: Normal       T-score at or above -1 SD Osteopenia   T-score between -1 and -2.5 SD Osteoporosis T-score at or below -2.5 SD RECOMMENDATION: [place] recommends that FDA-approved medical therapies be considered in postmenopausal women and men age 38 or older with a: 1. Hip or vertebral (clinical or morphometric) fracture. 2. T-score of <-2.5 at the spine or hip. 3. Ten-year fracture probability by FRAX of 3% or greater for hip fracture or 20% or  greater for major osteoporotic fracture. All treatment decisions require clinical judgment and consideration of individual patient factors, including patient preferences, co-morbidities, previous drug use, risk factors not captured in the FRAX model (e.g. falls, vitamin D deficiency, increased bone turnover, interval significant decline in bone density) and possible under - or over-estimation of fracture risk by FRAX. All patients should ensure an adequate intake of dietary calcium (1200 mg/d) and vitamin D (800 IU daily) unless contraindicated. FOLLOW-UP: People with diagnosed cases of osteoporosis or at high risk for fracture should have regular bone mineral density tests. For patients eligible for Medicare, routine testing is allowed once every 2 years. The testing frequency can be increased to one year for patients who have rapidly progressing disease, those who are receiving or discontinuing medical therapy to restore bone mass, or have additional risk factors. FRAX* 10-year Probability of Fracture Based on femoral neck BMD: DualFemur (Right) Major Osteoporotic Fracture: 18.4% Hip Fracture:                3.9% Population:                  [place] (Caucasian) Risk Factors:                History of Fracture (Adult) (V15.51) *FRAX is a Materials engineer of the [employer] for Metabolic Bone Disease, a [place] (WHO) [facility]. ASSESSMENT: The probability of a major osteoporotic fracture is 18.4 % within the next ten years. The probability of a hip fracture is 3.9 % within the next ten years. Electronically Signed   By: Earle Gell M.D.   On: 11/15/2016 11:17   Mammogram Digital Screening  Result Date: 11/16/2016 CLINICAL DATA:  Screening. EXAM: DIGITAL SCREENING BILATERAL MAMMOGRAM WITH CAD COMPARISON:  Previous exam(s). ACR Breast Density Category c: The breast tissue is heterogeneously dense, which may obscure small masses. FINDINGS: In the left breast,  calcifications warrant further evaluation. In the  right breast, no findings suspicious for malignancy. Images were processed with CAD. IMPRESSION: Further evaluation is suggested for calcifications in the left breast. RECOMMENDATION: Diagnostic mammogram of the left breast. (Code:[id number]) The patient will be contacted regarding the findings, and additional imaging will be scheduled. BI-RADS CATEGORY  0: Incomplete. Need additional imaging evaluation and/or prior mammograms for comparison. Electronically Signed   By: Marin Olp M.D.   On: 11/16/2016 08:31

## 2016-11-19 ENCOUNTER — Encounter: Payer: Self-pay | Admitting: Gastroenterology

## 2016-11-19 ENCOUNTER — Other Ambulatory Visit: Payer: Self-pay | Admitting: Internal Medicine

## 2016-11-19 DIAGNOSIS — R928 Other abnormal and inconclusive findings on diagnostic imaging of breast: Secondary | ICD-10-CM

## 2016-11-19 DIAGNOSIS — R921 Mammographic calcification found on diagnostic imaging of breast: Secondary | ICD-10-CM

## 2016-11-20 ENCOUNTER — Encounter: Payer: Self-pay | Admitting: Gastroenterology

## 2016-11-20 NOTE — Progress Notes (Signed)
CC'ED TO PCP 

## 2016-11-20 NOTE — Progress Notes (Signed)
Pt is set up for BPE on 11/28/16 @ 8:30 am.

## 2016-11-20 NOTE — Progress Notes (Signed)
Family is aware

## 2016-11-20 NOTE — Progress Notes (Signed)
Please schedule BPE for dysphagia. Ordered has been entered.

## 2016-11-20 NOTE — Assessment & Plan Note (Signed)
76 year old female with history of remote open Nissen fundoplication noted to be intact on multiple EGDs, multiple EGD with dilations (last one in 2015), history of reflux esophagitis/gastritis who presents for further evaluation of ongoing dysphagia. Patient has severe episodes about 2-3 times per year where she develops "coughing fit" when the food becomes lodged. On a regular basis she feels things are slow going down and feels pressure in the epigastric area. She's had no weight loss. Denies significant improvement with esophageal dilations. States her GI doctor told her he can stretch her anymore. Recent speech therapy evaluation with mild oropharyngeal dysphagia, no aspiration.  Would recommend barium pill esophagram the near future.   At some point, need to consider whether she wants to update her colonoscopy, last one in January 2008.

## 2016-11-21 ENCOUNTER — Other Ambulatory Visit: Payer: Self-pay | Admitting: Internal Medicine

## 2016-11-21 ENCOUNTER — Ambulatory Visit
Admission: RE | Admit: 2016-11-21 | Discharge: 2016-11-21 | Disposition: A | Discharge status: Home / Self Care | Payer: Medicare Other | Source: Ambulatory Visit | Attending: Internal Medicine | Admitting: Internal Medicine

## 2016-11-21 ENCOUNTER — Encounter: Payer: Medicare Other | Attending: Internal Medicine | Admitting: *Deleted

## 2016-11-21 DIAGNOSIS — R921 Mammographic calcification found on diagnostic imaging of breast: Secondary | ICD-10-CM

## 2016-11-21 DIAGNOSIS — H353231 Exudative age-related macular degeneration, bilateral, with active choroidal neovascularization: Secondary | ICD-10-CM | POA: Diagnosis not present

## 2016-11-21 DIAGNOSIS — Z6829 Body mass index (BMI) 29.0-29.9, adult: Secondary | ICD-10-CM | POA: Diagnosis not present

## 2016-11-21 DIAGNOSIS — Z713 Dietary counseling and surveillance: Secondary | ICD-10-CM | POA: Diagnosis not present

## 2016-11-21 DIAGNOSIS — H43813 Vitreous degeneration, bilateral: Secondary | ICD-10-CM | POA: Diagnosis not present

## 2016-11-21 DIAGNOSIS — H53413 Scotoma involving central area, bilateral: Secondary | ICD-10-CM | POA: Diagnosis not present

## 2016-11-21 DIAGNOSIS — E119 Type 2 diabetes mellitus without complications: Secondary | ICD-10-CM

## 2016-11-21 DIAGNOSIS — H2513 Age-related nuclear cataract, bilateral: Secondary | ICD-10-CM | POA: Diagnosis not present

## 2016-11-21 NOTE — Patient Instructions (Signed)
Plan:  Aim for 2-3 Carb Choices per meal (30-45 grams)   Aim for 0-1 Carbs per snack if hungry  Include protein in moderation with your meals and snacks Consider reading food labels for Total Carbohydrate of foods Continue with your activity level daily as tolerated Consider checking BG at alternate times per day   Consider taking medication as directed by MD    

## 2016-11-21 NOTE — Progress Notes (Signed)
Diabetes Self-Management Education  Visit Type:  Follow-up  Appt. Start Time: 1100 Appt. End Time: 1130  11/21/2016  [name], identified by name and date of birth, is a 76 y.o. female with a diagnosis of Diabetes: Type 2.  She is here with her daughter again who still appears supportive. She brought some food records which indicate 3 meals a day with some variability with carb intake from meal to meal but overall adequate. She had questions regarding number of vegetable and fruit servings she should be eating, including dark green vegetables due to Coumadin medication. She expressed frustration with lack of weight loss but acknowledges that her health parameters of BG, cholesterol and fitness level are all excellent.   ASSESSMENT  Height 5\' 2"  (1.575 m), weight 160 lb (72.6 kg). Body mass index is 29.26 kg/m.       Diabetes Self-Management Education - 11/21/16 1548      Health Coping   How would you rate your overall health? Good     Psychosocial Assessment   Self-care barriers None   Self-management support Family   Patient Concerns Nutrition/Meal planning;Weight Control;Glycemic Control     Complications   How often do you check your blood sugar? 1-2 times/day   Fasting Blood glucose range (mg/dL) 70-129   Postprandial Blood glucose range (mg/dL) 70-129     Exercise   Exercise Type Light (walking / raking leaves)   How many days per week to you exercise? 3   How many minutes per day do you exercise? 60   Total minutes per week of exercise 180     Patient Education   Previous Diabetes Education Yes (please comment)     Patient Self-Evaluation of Goals - Patient rates self as meeting previously set goals (% of time)   Nutrition >75%   Physical Activity >75%   Medications >75%   Monitoring >75%   Problem Solving >75%   Reducing Risk >75%   Health Coping >75%     Outcomes   Program Status Completed     Subsequent Visit   Since your last visit have you  experienced any weight changes? No change     Learning Objective:  Patient will have a greater understanding of diabetes self-management. Patient education plan is to attend individual and/or group sessions per assessed needs and concerns.  Plan:   Patient Instructions  Plan:  Aim for 2-3 Carb Choices per meal (30-45 grams)   Aim for 0-1 Carbs per snack if hungry  Include protein in moderation with your meals and snacks Consider reading food labels for Total Carbohydrate of foods Continue with your activity level daily as tolerated Consider checking BG at alternate times per day   Consider taking medication as directed by MD  Expected Outcomes:  Demonstrated interest in learning. Expect positive outcomes  Education material provided: Snack sheet  If problems or questions, patient to contact team via:  Phone  Future DSME appointment: - PRN

## 2016-11-22 ENCOUNTER — Other Ambulatory Visit: Payer: Self-pay | Admitting: Internal Medicine

## 2016-11-22 ENCOUNTER — Encounter: Payer: Self-pay | Admitting: Internal Medicine

## 2016-11-22 DIAGNOSIS — W57XXXA Bitten or stung by nonvenomous insect and other nonvenomous arthropods, initial encounter: Secondary | ICD-10-CM

## 2016-11-28 ENCOUNTER — Ambulatory Visit (HOSPITAL_COMMUNITY)
Admission: RE | Admit: 2016-11-28 | Discharge: 2016-11-28 | Disposition: A | Discharge status: Home / Self Care | Payer: Medicare Other | Source: Ambulatory Visit | Attending: Gastroenterology | Admitting: Gastroenterology

## 2016-11-28 DIAGNOSIS — R1310 Dysphagia, unspecified: Secondary | ICD-10-CM | POA: Diagnosis not present

## 2016-11-28 DIAGNOSIS — H353211 Exudative age-related macular degeneration, right eye, with active choroidal neovascularization: Secondary | ICD-10-CM | POA: Diagnosis not present

## 2016-11-28 DIAGNOSIS — K222 Esophageal obstruction: Secondary | ICD-10-CM | POA: Insufficient documentation

## 2016-11-30 ENCOUNTER — Encounter: Payer: Self-pay | Admitting: Internal Medicine

## 2016-12-02 NOTE — Progress Notes (Signed)
Stricture at [facility] obstructing barium tablet. Last EGD/ED in 2015 up to 22 F at outside facility, hypertensive LES, intact Nissen.    To discuss with RMR as patient denies previous improvement post dilation at outside facility. She will also need to hold coumadin +/- lovenox bridge.

## 2016-12-03 ENCOUNTER — Telehealth: Payer: Self-pay | Admitting: *Deleted

## 2016-12-03 NOTE — Telephone Encounter (Signed)
Patient's daughter [name]) called stating that her mother missed a dosage of coumdin sometimes last week. # [phone].

## 2016-12-03 NOTE — Telephone Encounter (Signed)
LM on cell phone for [place].  Instructed pt to continue current dose.  Will not give any additional coumadin since missed dose was last week.

## 2016-12-12 ENCOUNTER — Encounter: Payer: Self-pay | Admitting: Cardiology

## 2016-12-12 ENCOUNTER — Encounter: Payer: Self-pay | Admitting: Internal Medicine

## 2016-12-13 ENCOUNTER — Ambulatory Visit (INDEPENDENT_AMBULATORY_CARE_PROVIDER_SITE_OTHER): Payer: Medicare Other | Admitting: *Deleted

## 2016-12-13 DIAGNOSIS — I4891 Unspecified atrial fibrillation: Secondary | ICD-10-CM

## 2016-12-13 DIAGNOSIS — Z8673 Personal history of transient ischemic attack (TIA), and cerebral infarction without residual deficits: Secondary | ICD-10-CM | POA: Diagnosis not present

## 2016-12-13 DIAGNOSIS — Z5181 Encounter for therapeutic drug level monitoring: Secondary | ICD-10-CM | POA: Diagnosis not present

## 2016-12-13 LAB — POCT INR: INR: 2.9

## 2016-12-16 ENCOUNTER — Encounter: Payer: Self-pay | Admitting: Internal Medicine

## 2016-12-17 ENCOUNTER — Encounter: Payer: Self-pay | Admitting: Internal Medicine

## 2016-12-17 ENCOUNTER — Ambulatory Visit (INDEPENDENT_AMBULATORY_CARE_PROVIDER_SITE_OTHER): Payer: Medicare Other | Admitting: Internal Medicine

## 2016-12-17 VITALS — BP 148/68 | HR 57 | Temp 98.7°F | Wt 162.0 lb

## 2016-12-17 DIAGNOSIS — R2689 Other abnormalities of gait and mobility: Secondary | ICD-10-CM

## 2016-12-17 DIAGNOSIS — I6521 Occlusion and stenosis of right carotid artery: Secondary | ICD-10-CM

## 2016-12-17 DIAGNOSIS — W19XXXA Unspecified fall, initial encounter: Secondary | ICD-10-CM | POA: Diagnosis not present

## 2016-12-17 NOTE — Progress Notes (Signed)
Location:  [facility] clinic Provider: Aaliya Maultsby L. Mariea Clonts, D.O., C.M.D.  Code Status: full code Goals of Care:  Advanced Directives 10/16/2016  Does Patient Have a Medical Advance Directive? Yes  Type of Advance Directive -  Does patient want to make changes to medical advance directive? No - Patient declined  Copy of [place] in Chart? -  Would patient like information on creating a medical advance directive? -   Chief Complaint  Patient presents with  . Acute Visit    fall x 4days bruise on right side of buttock    HPI: Patient is a 76 y.o. female seen today for an acute visit for a fall 4 days ago with a bruise on her right buttock.  She continues to ambulate.  Says she can stand on one foot for 27 seconds, but her daughter says her balance is shaky.  Left foot was shakier than right.  Says she was standing on one leg in the shower and fell out of the tub when the fall occurred.    Is planning to travel so does not want her flu shot today.     Past Medical History:  Diagnosis Date  . Adjustment disorder 10/17/2005  . Atrial fibrillation ([place])   . Cataract    bilateral  . CHF (congestive heart failure) ([place])   . Contracture of knee joint 07/26/2009  . Coronary artery disease   . Degeneration of lumbar or lumbosacral intervertebral disc 07/26/2009  . Diabetes mellitus without complication ([place])   . Esophageal spasm   . Esophageal spasm   . Fibromyalgia   . Fibromyalgia   . Hearing loss 12/18/2010  . Heart murmur   . Hypercholesteremia   . Hypertension   . 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 Leaky heart valve   . Lyme disease   . Macular degeneration   . Macular degeneration of both eyes   . Migraine 10/17/2005  . Mixed incontinence 10/17/2005  . Osteoarthritis    multiple joints   . Overweight 09/19/2010  . Pneumonia   . Postartificial menopausal syndrome 10/10/2009  . Prinzmetal angina ([place]) 10/17/2005  . Psoriasis  06/12/2006  . Sciatica   . TIA (transient ischemic attack)   . Type II diabetes mellitus ([place])   . [name] 10/17/2005    Past Surgical History:  Procedure Laterality Date  . ABDOMINAL HYSTERECTOMY    . AORTIC VALVE REPLACEMENT  12/10/2013   porcine  . CHOLECYSTECTOMY  1981  . COLONOSCOPY  04/18/2006   Dr. Nelva Nay: internal hemorrhoids  . COLONOSCOPY  10/2003   Dr. Alphonsa Gin: hemorrhoids  . CORONARY ARTERY BYPASS GRAFT  2015  . ESOPHAGOGASTRODUODENOSCOPY  10/16/2013   Dr. Marla Roe: prior Nissen fundoplication intact, hypertonic LES, dilated up to 15 [place] with moderate resistance, gastritis but no H. pylori., Reactive gastritis, no celiac disease.  . ESOPHAGOGASTRODUODENOSCOPY  05/29/2012   Dr. Doy Mince: Moderately severe esophagitis, acute gastritis reactive, no H pylori, no celiac. Esophageal biopsies consistent with GERD, no Barrett  . ESOPHAGOGASTRODUODENOSCOPY  03/21/2009   Dr. Doy Mince: Reflux esophagitis, gastritis without H. pylori, esophagus stretched 57 savory  . ESOPHAGOGASTRODUODENOSCOPY  02/20/2008   Dr. Doy Mince: Esophagus dilated to 32 French, reactive gastropathy with no H pylori. No Barrett's on esophageal biopsy  . ESOPHAGOGASTRODUODENOSCOPY  09/24/2006   Dr. Doy Mince: Tight wrap noted, reactive gastropathy, no Barrett's  . FRACTURE SURGERY     wrist  . JOINT REPLACEMENT    . MITRAL VALVE REPAIR    .  NISSEN FUNDOPLICATION  4696  . REPLACEMENT TOTAL KNEE BILATERAL  2010/2013    Allergies  Allergen Reactions  . Penicillins Anaphylaxis    Has patient had a PCN reaction causing immediate rash, facial/tongue/throat swelling, SOB or lightheadedness with hypotension: Yes Has patient had a PCN reaction causing severe rash involving mucus membranes or skin necrosis: Yes Has patient had a PCN reaction that required hospitalization Yes Has patient had a PCN reaction occurring within the last 10 years: No If all of the above answers are "NO", then may proceed  with Cephalosporin use.   . Latex     Redness and rash  . Morphine And Related     Nausea, dizziness  . Tape     Redness and rash  . Iodine     Redness and rash    Outpatient Encounter Prescriptions as of 12/17/2016  Medication Sig  . acetaminophen (TYLENOL) 500 MG tablet Take 1,000 mg by mouth every 8 (eight) hours as needed.   Marland Kitchen aspirin EC 81 MG tablet Take 81 mg by mouth every morning. 8 am  . atorvastatin (LIPITOR) 80 MG tablet Take 1 tablet (80 mg total) by mouth daily.  . Calcium Carb-Cholecalciferol (CALTRATE 600+D3 SOFT PO) Take by mouth 2 (two) times daily.  . cetirizine (ZYRTEC) 10 MG tablet Take 10 mg by mouth daily.  . Cholecalciferol (VITAMIN D3) 2000 units TABS Take by mouth daily.  . fluticasone (FLONASE) 50 MCG/ACT nasal spray Place 2 sprays into both nostrils daily.  . furosemide (LASIX) 20 MG tablet Take 20 mg daily as needed for leg swelling  . gabapentin (NEURONTIN) 100 MG capsule Take 1 capsule (100 mg total) by mouth every morning. Take 2-3 tablets by mouth at bedtime  . loperamide (IMODIUM A-D) 2 MG tablet Take 2 mg by mouth as needed for diarrhea or loose stools.  Marland Kitchen losartan (COZAAR) 100 MG tablet Take 1 tablet (100 mg total) by mouth daily.  . Melatonin 10 MG TABS Take by mouth at bedtime.  . metFORMIN (GLUCOPHAGE) 500 MG tablet Take 1 tablet (500 mg total) by mouth 2 (two) times daily with a meal. 8am and 7pm  . metoprolol tartrate (LOPRESSOR) 25 MG tablet Take 0.5 tablets (12.5 mg total) by mouth 2 (two) times daily. 8 am & 7 pm  . potassium chloride (K-DUR) 10 MEQ tablet Take potassium 10 meq only on days you take lasix  . warfarin (COUMADIN) 6 MG tablet Take 1 tablet (6 mg total) by mouth daily.  . [DISCONTINUED] Blood Glucose Monitoring Suppl (FORACARE PREMIUM V10) DEVI Use to test blood sugar three times daily. Dx: E11.9  . [DISCONTINUED] Lancet Devices (TRUEDRAW LANCING DEVICE) MISC Use as Directed  . [DISCONTINUED] Loratadine 10 MG CAPS Take 1 capsule  (10 mg total) by mouth daily as needed.   No facility-administered encounter medications on file as of 12/17/2016.     Review of Systems:  Review of Systems  Constitutional: Negative for chills and fever.  HENT: Negative for congestion.   Eyes: Negative for blurred vision.  Respiratory: Negative for shortness of breath.   Cardiovascular: Negative for chest pain and palpitations.  Gastrointestinal: Negative for abdominal pain.  Genitourinary: Negative for dysuria.  Musculoskeletal: Positive for falls.       Right Buttock pain  Neurological: Negative for dizziness.       Balance problem  Endo/Heme/Allergies: Bruises/bleeds easily.  Psychiatric/Behavioral: Negative for depression.    Health Maintenance  Topic Date Due  . OPHTHALMOLOGY EXAM  10/10/2016  .  INFLUENZA VACCINE  10/24/2016  . PNA vac Low Risk Adult (2 of 2 - PPSV23) 10/31/2016  . FOOT EXAM  12/05/2016  . HEMOGLOBIN A1C  04/06/2017  . MAMMOGRAM  11/22/2018  . COLONOSCOPY  03/26/2022  . TETANUS/TDAP  10/13/2026  . DEXA SCAN  Completed    Physical Exam: Vitals:   12/17/16 1550  BP: (!) 148/68  Pulse: (!) 57  Temp: 98.7 F (37.1 C)  TempSrc: Oral  SpO2: 97%  Weight: 162 lb (73.5 kg)   Body mass index is 29.63 kg/m. Physical Exam  Constitutional: She is oriented to person, place, and time. She appears well-developed and well-nourished. No distress.  Cardiovascular: Normal rate, regular rhythm, normal heart sounds and intact distal pulses.   Pulmonary/Chest: Effort normal and breath sounds normal. No respiratory distress.  Abdominal: Bowel sounds are normal.  Musculoskeletal: Normal range of motion. She exhibits tenderness.  Right buttock  Neurological: She is alert and oriented to person, place, and time.  Skin:  Softball sized ecchymoses/hematoma dark purple with yellow border on right buttock, tender  Psychiatric: She has a normal mood and affect.    Labs reviewed: Basic Metabolic Panel:  Recent  Labs  12/26/15 1453 04/13/16 1132 10/04/16 0958  NA 139 139 139  K 4.0 4.6 4.5  CL 101 102 103  CO2 '22 31 28  ' GLUCOSE 94 85 128*  BUN 28* 23 20  CREATININE 1.53* 0.88 0.85  CALCIUM 9.3 9.7 9.5   Liver Function Tests:  Recent Labs  12/26/15 1453 04/13/16 1132 10/04/16 0958  AST '22 20 19  ' ALT '18 16 17  ' ALKPHOS 106 91 99  BILITOT 0.4 0.5 0.5  PROT 7.0 7.0 6.8  ALBUMIN 4.0 4.2 4.0    Recent Labs  12/26/15 1453  LIPASE 99*  AMYLASE 72   No results for input(s): AMMONIA in the last 8760 hours. CBC:  Recent Labs  12/26/15 1453 04/13/16 1132 10/04/16 0958  WBC 11.4* 9.3 8.8  NEUTROABS 8,208* 6,231 5,896  HGB 13.0 12.6 12.4  HCT 39.0 39.5 37.8  MCV 83.3 85.1 85.9  PLT 433* 409* 394   Lipid Panel:  Recent Labs  10/04/16 0958  CHOL 133  HDL 53  LDLCALC 57  TRIG 116  CHOLHDL 2.5   Lab Results  Component Value Date   HGBA1C 7.0 (H) 10/04/2016    Procedures since last visit: Dg Esophagus  Result Date: 11/28/2016 CLINICAL DATA:  Dysphagia for foods getting stuck in mid chest area for 20-25 years, began a couple years after a Nissen fundoplication for GERD, prior esophageal dilatation EXAM: ESOPHOGRAM / BARIUM SWALLOW / BARIUM TABLET STUDY TECHNIQUE: Combined double contrast and single contrast examination performed using effervescent crystals, thick barium liquid, and thin barium liquid. The patient was observed with fluoroscopy swallowing a 13 mm barium sulphate tablet. FLUOROSCOPY TIME:  Fluoroscopy Time:  2 minutes 30 seconds Radiation Exposure Index (if provided by the fluoroscopic device): 47.2 mGy Number of Acquired Spot Images: multiple fluoroscopic screen captures COMPARISON:  None FINDINGS: Normal esophageal distention without mass or persistent intraluminal filling defect. Narrowing at the gastroesophageal junction, which obstructed a 12.5 mm diameter barium tablet. Discrete shoulders of a Nissen fundoplication wrap are not visualized. Smooth appearance  of esophageal mucosa on air contrast imaging without irregularity or ulceration. Diffuse age-related impairment of esophageal motility with incomplete clearance of barium by primary peristaltic waves and prolonged thoracic esophageal retention of contrast. Targeted rapid sequence imaging of the cervical esophagus and hypopharynx showed no laryngeal  penetration or aspiration. No significant residuals. IMPRESSION: Stricture at the gastroesophageal junction obstructing a 12.5 mm diameter barium tablet. Diffuse age-related impairment of esophageal motility. Electronically Signed   By: Lavonia Dana M.D.   On: 11/28/2016 10:12   Mm Digital Diagnostic Unilat L  Result Date: 11/21/2016 CLINICAL DATA:  New calcifications in the posterior aspect of the upper-outer left breast on a recent screening mammogram. EXAM: DIGITAL DIAGNOSTIC LEFT MAMMOGRAM WITH CAD COMPARISON:  Previous exam(s). ACR Breast Density Category c: The breast tissue is heterogeneously dense, which may obscure small masses. FINDINGS: 2D true lateral and spot magnification views of the left breast demonstrate a 6 mm group of coarse calcifications in the upper-outer quadrant of the left breast. These have a coral-like configuration. Mammographic images were processed with CAD. IMPRESSION: Left breast benign calcifications compatible with a degenerated fibroadenoma. No evidence of malignancy. RECOMMENDATION: Bilateral screening mammogram in 1 year. I have discussed the findings and recommendations with the patient. Results were also provided in writing at the conclusion of the visit. If applicable, a reminder letter will be sent to the patient regarding the next appointment. BI-RADS CATEGORY  2: Benign. Electronically Signed   By: Claudie Revering M.D.   On: 11/21/2016 13:44    Assessment/Plan 1. Fall, initial encounter -advised not to stand on one leg when bathing in the shower due to that causing fall - Ambulatory referral to Physical Therapy  2.  Balance problem - needs additional therapy exercises for balance, also continue weekly 30 min tai chi class and other exercise classes thru [employer] - Ambulatory referral to Physical Therapy   Labs/tests ordered:   Orders Placed This Encounter  Procedures  . Ambulatory referral to Physical Therapy    Referral Priority:   Routine    Referral Type:   Physical Medicine    Referral Reason:   Specialty Services Required    Requested Specialty:   Physical Therapy    Number of Visits Requested:   1    Next appt:  02/07/2017  Ayano Douthitt L. Macklen Wilhoite, D.O. [facility] [address], [place] [address] Cell Phone (Mon-Fri 8am-5pm):  [phone] On Call:  [phone] & follow prompts after 5pm & weekends Office Phone:  [phone] Office Fax:  [phone]

## 2016-12-17 NOTE — Progress Notes (Signed)
Jeanette Paganini do we need to schedule this?

## 2016-12-18 ENCOUNTER — Encounter: Payer: Self-pay | Admitting: Internal Medicine

## 2016-12-20 ENCOUNTER — Telehealth: Payer: Self-pay

## 2016-12-20 NOTE — Telephone Encounter (Signed)
Tried to call pt- Jeanette Yates with recommendations. Ginger, if she calls back will you schedule her and let Lattie Haw know. Thanks.

## 2016-12-20 NOTE — Progress Notes (Signed)
Discussed with Dr. Gala Romney, he agrees with EGD/ED in OR with RMR.  She will need to hold coumadin for 4 days before and potentially afterwards for short period. PLEASE GET APPROVAL FROM CARDIOLOGY ([facility]) AND IF SHE NEEDS LOVENOX HAVE THEM HELP [place].

## 2016-12-20 NOTE — Telephone Encounter (Signed)
Route to clinical pool

## 2016-12-20 NOTE — Telephone Encounter (Signed)
Pt has a CHADS2-VASC score of 7.  She will need to be bridged with Lovenox.  Please let me know when she is scheduled and I will manage Lovenox.  I will need to see pt 1 week before procedure. Lattie Haw

## 2016-12-20 NOTE — Telephone Encounter (Signed)
Hi Lattie Haw, can you help [place] with this?

## 2016-12-20 NOTE — Telephone Encounter (Signed)
Notes recorded by Mahala Menghini, PA-C on 12/20/2016 at 8:06 AM EDT Discussed with Dr. Gala Romney, he agrees with EGD/ED in OR with RMR.  She will need to hold coumadin for 4 days before and potentially afterwards for short period. PLEASE GET APPROVAL FROM CARDIOLOGY ([facility]) AND IF SHE NEEDS LOVENOX HAVE THEM HELP [place]. ------

## 2016-12-21 ENCOUNTER — Other Ambulatory Visit: Payer: Self-pay

## 2016-12-21 ENCOUNTER — Telehealth: Payer: Self-pay | Admitting: Cardiology

## 2016-12-21 DIAGNOSIS — R1310 Dysphagia, unspecified: Secondary | ICD-10-CM

## 2016-12-21 NOTE — Telephone Encounter (Signed)
Ok to increase lasix dose like she did, did the higher dose help? Please clarify the exact dose she was given and how many days.   Zandra Abts MD

## 2016-12-21 NOTE — Telephone Encounter (Signed)
Spoke with [name] and she confirmed that patient is taking furosemide 20 mg BID prn and started taking it this way this week with improvement in symptoms.

## 2016-12-21 NOTE — Telephone Encounter (Signed)
Pt's daughter [name]) called office. EGD/ED in OR with RMR scheduled for 01/09/17 at 2:15pm. Daughter is aware that Edrick Oh RN at Coumadin clinic will manage Coumadin/Lovenox bridge. Instructions mailed for procedure. Orders entered. Called daughter back and informed of pre-op appt 01/03/17 at 9:00am.   Routing to Edrick Oh to inform of procedure date.

## 2016-12-21 NOTE — Telephone Encounter (Signed)
Routed to [employer].

## 2016-12-21 NOTE — Telephone Encounter (Signed)
Pt's daughter [name] as a question about the pt's Lasix

## 2016-12-21 NOTE — Telephone Encounter (Signed)
Spoke with daughter [name]) and she states that patient has taken her prn furosemide twice daily for about 3 days this week d/t feeling bloated, BLE and increase in weight. Daughter is calling to make MD aware of this change since she increased the dosage herself. Please advise.

## 2016-12-24 NOTE — Telephone Encounter (Signed)
Called daughter [name] with INR appt on 01/02/17 for Lovenox bridging instructions.  She verbalized understanding.

## 2016-12-25 ENCOUNTER — Other Ambulatory Visit: Payer: Self-pay | Admitting: *Deleted

## 2016-12-25 MED ORDER — ENOXAPARIN SODIUM 120 MG/0.8ML ~~LOC~~ SOLN: 120.0000 mg | SUBCUTANEOUS | 0 refills | Status: DC

## 2016-12-31 ENCOUNTER — Telehealth: Payer: Self-pay

## 2016-12-31 NOTE — Telephone Encounter (Signed)
She needs stool test as soon as she gets back.  GI path panel please.

## 2016-12-31 NOTE — Telephone Encounter (Signed)
Pt's daughter called Jeanette Yates) came by the office. She has been having watery diarrhea for the past 3 weeks. 3-5 episodes per day. She is taking Imodium prn. Her daughter said the only reason that she told her was because she ran out of Imodium. She told her daughter while they were on the way to the airport. The pt is currently in [place] until 01/01/17. She is having bloating. No antibiotics recently. No fever. Bonnie's number is [phone].  Routing to LSL.

## 2016-12-31 NOTE — Telephone Encounter (Signed)
Pt's daughter called office 12/28/16 and LMOVM. Pt is scheduled for EGD 01/09/17. Tried to call her daughter back before I left 12/28/16, no answer, LMOVM and told her that I was returning her call. Also informed her that our office would be closing at 12:00. Asked her to call me back if needed.

## 2016-12-31 NOTE — Patient Instructions (Signed)
Jeanette Yates  83/06/1958     @PREFPERIOPPHARMACY @   Your procedure is scheduled on 01/09/2017.  Report to [facility] at 12:45 A.M.  Call this number if you have problems the morning of surgery:  [phone]   Remember:  Do not eat food or drink liquids after midnight.  Take these medicines the morning of surgery with A SIP OF WATER Zyrtec, Flonase, Gabapentin, Cozaar, Metoprolol  DO NOT TAKE DIABETIC MEDICATIONS MORNING OF PROCEDURE   Do not wear jewelry, make-up or nail polish.  Do not wear lotions, powders, or perfumes, or deoderant.  Do not shave 48 hours prior to surgery.  Men may shave face and neck.  Do not bring valuables to the hospital.  [facility] is not responsible for any belongings or valuables.  Contacts, dentures or bridgework may not be worn into surgery.  Leave your suitcase in the car.  After surgery it may be brought to your room.  For patients admitted to the hospital, discharge time will be determined by your treatment team.  Patients discharged the day of surgery will not be allowed to drive home.    Please read over the following fact sheets that you were given. Anesthesia Post-op Instructions    PATIENT INSTRUCTIONS POST-ANESTHESIA  IMMEDIATELY FOLLOWING SURGERY:  Do not drive or operate machinery for the first twenty four hours after surgery.  Do not make any important decisions for twenty four hours after surgery or while taking narcotic pain medications or sedatives.  If you develop intractable nausea and vomiting or a severe headache please notify your doctor immediately.  FOLLOW-UP:  Please make an appointment with your surgeon as instructed. You do not need to follow up with anesthesia unless specifically instructed to do so.  WOUND CARE INSTRUCTIONS (if applicable):  Keep a dry clean dressing on the anesthesia/puncture wound site if there is drainage.  Once the wound has quit draining you may leave it open to air.  Generally you should  leave the bandage intact for twenty four hours unless there is drainage.  If the epidural site drains for more than 36-48 hours please call the anesthesia department.  QUESTIONS?:  Please feel free to call your physician or the hospital operator if you have any questions, and they will be happy to assist you.      Esophagogastroduodenoscopy Esophagogastroduodenoscopy (EGD) is a procedure to examine the lining of the esophagus, stomach, and first part of the small intestine (duodenum). This procedure is done to check for problems such as inflammation, bleeding, ulcers, or growths. During this procedure, a long, flexible, lighted tube with a camera attached (endoscope) is inserted down the throat. Tell a health care provider about:  Any allergies you have.  All medicines you are taking, including vitamins, herbs, eye drops, creams, and over-the-counter medicines.  Any problems you or family members have had with anesthetic medicines.  Any blood disorders you have.  Any surgeries you have had.  Any medical conditions you have.  Whether you are pregnant or may be pregnant. What are the risks? Generally, this is a safe procedure. However, problems may occur, including:  Infection.  Bleeding.  A tear (perforation) in the esophagus, stomach, or duodenum.  Trouble breathing.  Excessive sweating.  Spasms of the larynx.  A slowed heartbeat.  Low blood pressure.  What happens before the procedure?  Follow instructions from your health care provider about eating or drinking restrictions.  Ask your health care provider about: ? Changing or stopping  your regular medicines. This is especially important if you are taking diabetes medicines or blood thinners. ? Taking medicines such as aspirin and ibuprofen. These medicines can thin your blood. Do not take these medicines before your procedure if your health care provider instructs you not to.  Plan to have someone take you home  after the procedure.  If you wear dentures, be ready to remove them before the procedure. What happens during the procedure?  To reduce your risk of infection, your health care team will wash or sanitize their hands.  An IV tube will be put in a vein in your hand or arm. You will get medicines and fluids through this tube.  You will be given one or more of the following: ? A medicine to help you relax (sedative). ? A medicine to numb the area (local anesthetic). This medicine may be sprayed into your throat. It will make you feel more comfortable and keep you from gagging or coughing during the procedure. ? A medicine for pain.  A mouth guard may be placed in your mouth to protect your teeth and to keep you from biting on the endoscope.  You will be asked to lie on your left side.  The endoscope will be lowered down your throat into your esophagus, stomach, and duodenum.  Air will be put into the endoscope. This will help your health care provider see better.  The lining of your esophagus, stomach, and duodenum will be examined.  Your health care provider may: ? Take a tissue sample so it can be looked at in a lab (biopsy). ? Remove growths. ? Remove objects (foreign bodies) that are stuck. ? Treat any bleeding with medicines or other devices that stop tissue from bleeding. ? Widen (dilate) or stretch narrowed areas of your esophagus and stomach.  The endoscope will be taken out. The procedure may vary among health care providers and hospitals. What happens after the procedure?  Your blood pressure, heart rate, breathing rate, and blood oxygen level will be monitored often until the medicines you were given have worn off.  Do not eat or drink anything until the numbing medicine has worn off and your gag reflex has returned. This information is not intended to replace advice given to you by your health care provider. Make sure you discuss any questions you have with your health  care provider. Document Released: 07/13/2004 Document Revised: 08/18/2015 Document Reviewed: 02/03/2015 Elsevier Interactive Patient Education  [employer].

## 2017-01-02 ENCOUNTER — Ambulatory Visit (INDEPENDENT_AMBULATORY_CARE_PROVIDER_SITE_OTHER): Payer: Medicare Other | Admitting: *Deleted

## 2017-01-02 ENCOUNTER — Other Ambulatory Visit: Payer: Self-pay | Admitting: Gastroenterology

## 2017-01-02 ENCOUNTER — Other Ambulatory Visit: Payer: Self-pay

## 2017-01-02 ENCOUNTER — Other Ambulatory Visit (HOSPITAL_COMMUNITY)
Admission: RE | Admit: 2017-01-02 | Discharge: 2017-01-02 | Disposition: A | Discharge status: Home / Self Care | Payer: Medicare Other | Source: Ambulatory Visit | Attending: Cardiology | Admitting: Cardiology

## 2017-01-02 DIAGNOSIS — I4891 Unspecified atrial fibrillation: Secondary | ICD-10-CM | POA: Insufficient documentation

## 2017-01-02 DIAGNOSIS — Z5181 Encounter for therapeutic drug level monitoring: Secondary | ICD-10-CM

## 2017-01-02 DIAGNOSIS — Z8673 Personal history of transient ischemic attack (TIA), and cerebral infarction without residual deficits: Secondary | ICD-10-CM | POA: Diagnosis not present

## 2017-01-02 DIAGNOSIS — R197 Diarrhea, unspecified: Secondary | ICD-10-CM

## 2017-01-02 LAB — POCT INR: INR: 6.8

## 2017-01-02 LAB — PROTIME-INR
INR: 4.04
PROTHROMBIN TIME: 38.9 s — AB (ref 11.4–15.2)

## 2017-01-02 NOTE — Telephone Encounter (Signed)
Tried to call pts daughterHorris Yates, [place]. Lab order done and sent to [facility].

## 2017-01-02 NOTE — Patient Instructions (Addendum)
01/05/17-Last dose of Coumadin.  01/06/17- No Coumadin or Lovenox.  01/07/17- Inject Lovenox 120mg  in the fatty abdominal tissue at least 2 inches from the belly button once a day about 24 hours apart at 8am  rotate sites. No Coumadin.  01/08/17- Inject Lovenox in the fatty tissue in the morning at 8 am. No Coumadin.  01/09/17- Procedure Day - No Lovenox - Resume Coumadin in the evening or as directed by doctor (take an extra half tablet with usual dose for 2 days then resume normal dose).  01/10/17- Resume Lovenox inject in the fatty tissue every 12 hours and take Coumadin.  01/11/17- Inject Lovenox in the fatty tissue every 12 hours and take Coumadin.  01/12/17- Inject Lovenox in the fatty tissue every 12 hours and take Coumadin.  01/13/17- Inject Lovenox in the fatty tissue every 12 hours and take Coumadin.  01/14/17- Coumadin appt to check INR @ 345pm.

## 2017-01-03 ENCOUNTER — Encounter (HOSPITAL_COMMUNITY)
Admission: RE | Admit: 2017-01-03 | Discharge: 2017-01-03 | Disposition: A | Discharge status: Home / Self Care | Payer: Medicare Other | Source: Ambulatory Visit | Attending: Internal Medicine | Admitting: Internal Medicine

## 2017-01-03 ENCOUNTER — Encounter (HOSPITAL_COMMUNITY): Payer: Self-pay

## 2017-01-03 ENCOUNTER — Other Ambulatory Visit: Payer: Self-pay | Admitting: Gastroenterology

## 2017-01-03 DIAGNOSIS — R1310 Dysphagia, unspecified: Secondary | ICD-10-CM | POA: Insufficient documentation

## 2017-01-03 DIAGNOSIS — Z01818 Encounter for other preprocedural examination: Secondary | ICD-10-CM | POA: Diagnosis not present

## 2017-01-03 DIAGNOSIS — R197 Diarrhea, unspecified: Secondary | ICD-10-CM | POA: Diagnosis not present

## 2017-01-03 HISTORY — DX: Other specified postprocedural states: Z98.890

## 2017-01-03 HISTORY — DX: Nausea with vomiting, unspecified: R11.2

## 2017-01-03 LAB — BASIC METABOLIC PANEL
Anion gap: 7 (ref 5–15)
BUN: 24 mg/dL — AB (ref 6–20)
CHLORIDE: 102 mmol/L (ref 101–111)
CO2: 29 mmol/L (ref 22–32)
Calcium: 8.8 mg/dL — ABNORMAL LOW (ref 8.9–10.3)
Creatinine, Ser: 1.13 mg/dL — ABNORMAL HIGH (ref 0.44–1.00)
GFR calc Af Amer: 54 mL/min — ABNORMAL LOW (ref >60)
GFR calc non Af Amer: 47 mL/min — ABNORMAL LOW (ref >60)
GLUCOSE: 99 mg/dL (ref 65–99)
POTASSIUM: 3.8 mmol/L (ref 3.5–5.1)
SODIUM: 138 mmol/L (ref 135–145)

## 2017-01-03 LAB — CBC WITH DIFFERENTIAL/PLATELET
Basophils Absolute: 0 10*3/uL (ref 0.0–0.1)
Basophils Relative: 1 %
Eosinophils Absolute: 0.3 10*3/uL (ref 0.0–0.7)
Eosinophils Relative: 4 %
HCT: 36.5 % (ref 36.0–46.0)
HEMOGLOBIN: 11.7 g/dL — AB (ref 12.0–15.0)
LYMPHS ABS: 2.1 10*3/uL (ref 0.7–4.0)
LYMPHS PCT: 24 %
MCH: 28.5 pg (ref 26.0–34.0)
MCHC: 32.1 g/dL (ref 30.0–36.0)
MCV: 89 fL (ref 78.0–100.0)
Monocytes Absolute: 0.9 10*3/uL (ref 0.1–1.0)
Monocytes Relative: 10 %
NEUTROS ABS: 5.3 10*3/uL (ref 1.7–7.7)
NEUTROS PCT: 61 %
Platelets: 316 10*3/uL (ref 150–400)
RBC: 4.1 MIL/uL (ref 3.87–5.11)
RDW: 13.4 % (ref 11.5–15.5)
WBC: 8.6 10*3/uL (ref 4.0–10.5)

## 2017-01-03 NOTE — Telephone Encounter (Signed)
pts daughter is aware. 

## 2017-01-07 ENCOUNTER — Ambulatory Visit (HOSPITAL_COMMUNITY): Payer: Medicare Other | Attending: Internal Medicine | Admitting: Physical Therapy

## 2017-01-07 ENCOUNTER — Encounter (HOSPITAL_COMMUNITY): Payer: Self-pay | Admitting: Physical Therapy

## 2017-01-07 DIAGNOSIS — R2681 Unsteadiness on feet: Secondary | ICD-10-CM | POA: Diagnosis not present

## 2017-01-07 DIAGNOSIS — M79605 Pain in left leg: Secondary | ICD-10-CM | POA: Diagnosis not present

## 2017-01-07 DIAGNOSIS — M6281 Muscle weakness (generalized): Secondary | ICD-10-CM | POA: Insufficient documentation

## 2017-01-07 DIAGNOSIS — Z9181 History of falling: Secondary | ICD-10-CM | POA: Diagnosis not present

## 2017-01-07 DIAGNOSIS — R29898 Other symptoms and signs involving the musculoskeletal system: Secondary | ICD-10-CM | POA: Insufficient documentation

## 2017-01-07 LAB — GI PROFILE, STOOL, PCR
ADENOVIRUS F 40/41: NOT DETECTED
Astrovirus: NOT DETECTED
C DIFFICILE TOXIN A/B: NOT DETECTED
CAMPYLOBACTER: NOT DETECTED
Cryptosporidium: NOT DETECTED
Cyclospora cayetanensis: NOT DETECTED
ENTAMOEBA HISTOLYTICA: NOT DETECTED
ENTEROAGGREGATIVE E COLI: NOT DETECTED
ENTEROTOXIGENIC E COLI: NOT DETECTED
Enteropathogenic E coli: DETECTED — AB
GIARDIA LAMBLIA: NOT DETECTED
NOROVIRUS GI/GII: NOT DETECTED
Plesiomonas shigelloides: NOT DETECTED
Rotavirus A: NOT DETECTED
SALMONELLA: NOT DETECTED
Sapovirus: NOT DETECTED
Shiga-toxin-producing E coli: NOT DETECTED
Shigella/Enteroinvasive E coli: NOT DETECTED
VIBRIO CHOLERAE: NOT DETECTED
Vibrio: NOT DETECTED
YERSINIA ENTEROCOLITICA: NOT DETECTED

## 2017-01-07 NOTE — Therapy (Signed)
[place], [place], [address] Phone: [phone]   Fax:  [phone]  Physical Therapy Evaluation  Patient Details  Name: Jeanette Yates MRN: [id number] Date of Birth: 09-25-1940 Referring Provider: Hollace Kinnier   Encounter Date: 01/07/2017      PT End of Session - 01/07/17 1419    Visit Number 1   Number of Visits 9   Date for PT Re-Evaluation 02/04/17   Authorization Type Medicare and AARP    Authorization Time Period 01/07/17 to 02/07/17   Authorization - Visit Number 1   Authorization - Number of Visits 10   PT Start Time 6967  patient arrived late    PT Stop Time 1343   PT Time Calculation (min) 32 min   Equipment Utilized During Treatment Gait belt   Activity Tolerance Patient tolerated treatment well   Behavior During Therapy [facility] for tasks assessed/performed      Past Medical History:  Diagnosis Date  . Adjustment disorder 10/17/2005  . Atrial fibrillation ([place])   . Cataract    bilateral  . CHF (congestive heart failure) ([place])   . Contracture of knee joint 07/26/2009  . Coronary artery disease   . Degeneration of lumbar or lumbosacral intervertebral disc 07/26/2009  . Diabetes mellitus without complication ([place])   . Esophageal spasm   . Esophageal spasm   . Fibromyalgia   . Fibromyalgia   . Hearing loss 12/18/2010  . Heart murmur   . Hypercholesteremia   . Hypertension   . 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 Leaky heart valve   . Lyme disease   . Macular degeneration   . Macular degeneration of both eyes   . Migraine 10/17/2005  . Mixed incontinence 10/17/2005  . Osteoarthritis    multiple joints   . Overweight 09/19/2010  . Pneumonia   . PONV (postoperative nausea and vomiting)   . Postartificial menopausal syndrome 10/10/2009  . Prinzmetal angina ([place]) 10/17/2005  . Psoriasis 06/12/2006  . Sciatica   . TIA (transient ischemic attack)   . Type II  diabetes mellitus ([place])   . [name] 10/17/2005    Past Surgical History:  Procedure Laterality Date  . ABDOMINAL HYSTERECTOMY    . AORTIC VALVE REPLACEMENT  12/10/2013   porcine  . CHOLECYSTECTOMY  1981  . COLONOSCOPY  04/18/2006   Dr. Nelva Nay: internal hemorrhoids  . COLONOSCOPY  10/2003   Dr. Alphonsa Gin: hemorrhoids  . CORONARY ARTERY BYPASS GRAFT  2015  . ESOPHAGOGASTRODUODENOSCOPY  10/16/2013   Dr. Marla Roe: prior Nissen fundoplication intact, hypertonic LES, dilated up to 58 [place] with moderate resistance, gastritis but no H. pylori., Reactive gastritis, no celiac disease.  . ESOPHAGOGASTRODUODENOSCOPY  05/29/2012   Dr. Doy Mince: Moderately severe esophagitis, acute gastritis reactive, no H pylori, no celiac. Esophageal biopsies consistent with GERD, no Barrett  . ESOPHAGOGASTRODUODENOSCOPY  03/21/2009   Dr. Doy Mince: Reflux esophagitis, gastritis without H. pylori, esophagus stretched 57 savory  . ESOPHAGOGASTRODUODENOSCOPY  02/20/2008   Dr. Doy Mince: Esophagus dilated to 74 French, reactive gastropathy with no H pylori. No Barrett's on esophageal biopsy  . ESOPHAGOGASTRODUODENOSCOPY  09/24/2006   Dr. Doy Mince: Tight wrap noted, reactive gastropathy, no Barrett's  . FRACTURE SURGERY Left    wrist  . JOINT REPLACEMENT    . MITRAL VALVE REPAIR  11/2013  . NISSEN FUNDOPLICATION  8938  . REPLACEMENT TOTAL KNEE BILATERAL  2010/2013    There were no vitals filed for this  visit.       Subjective Assessment - 01/07/17 1312    Subjective Patient arrives stating that she lifted up her leg to wash it while in the shower and fell; this happened about a month ago. She has a bad bruise due to this but no other injuries. She has been feeling a bit unteady recently as well. Pateint rates herself as being 75%; family rates her at 55% however. patient reports poor vision due to macular degeneration.    Pertinent History history TIA, history A-fib, CHF, DM, fibromyalgia, CHF, history  chornic back pain, macular degeneration and cataracts impairing vision, HTN    Patient Stated Goals improve gait over stairs and outside, improve strength including core, improve flexibility; review of back exercises given last bout of PT    Currently in Pain? No/denies            [facility] PT Assessment - 01/07/17 0001      Assessment   Medical Diagnosis balance/falls    Referring Provider Tiffany Reed    Onset Date/Surgical Date --  about a month ago    Next MD Visit Dr. Mariea Clonts end of November    Prior Therapy PT here in 2017     Precautions   Precautions Fall;Other (comment)   Precaution Comments poor vision      Restrictions   Weight Bearing Restrictions No     Balance Screen   Has the patient fallen in the past 6 months Yes   How many times? 1   Has the patient had a decrease in activity level because of a fear of falling?  Yes   Is the patient reluctant to leave their home because of a fear of falling?  No     Prior Function   Level of Independence Independent   Vocation Retired   Clinical research associate, reading on ipad, gardening, puttering outside      Strength   Right Hip Flexion 4/5   Right Hip Extension 3+/5   Right Hip ABduction 4+/5   Left Hip Flexion 4/5   Left Hip Extension 3+/5   Left Hip ABduction 4+/5   Right Knee Flexion 4+/5   Right Knee Extension 5/5   Left Knee Flexion 4+/5   Left Knee Extension 5/5   Right Ankle Dorsiflexion 5/5   Left Ankle Dorsiflexion 5/5     Ambulation/Gait   Gait Comments rigid truck, proximal weakness, mild unsteadiness especially with corners      6 minute walk test results    Aerobic Endurance Distance Walked 577   Endurance additional comments 3MWT, no device      Standardized Balance Assessment   Standardized Balance Assessment Dynamic Gait Index     Dynamic Gait Index   Level Surface Normal   Change in Gait Speed Normal   Gait with Horizontal Head Turns Mild Impairment   Gait with Vertical Head Turns Mild  Impairment   Gait and Pivot Turn Mild Impairment   Step Over Obstacle Normal   Step Around Obstacles Normal   Steps Mild Impairment   Total Score 20   DGI comment: 83%             Objective measurements completed on examination: See above findings.                  PT Education - 01/07/17 1418    Education provided Yes   Education Details exam findings, POC, prognosis; possible role of poor vision and depth perception in current  complaints of unsteadiness; glow tape and brightly colored tape to assist with perception on stairs    Person(s) Educated Patient;Child(ren)   Methods Explanation   Comprehension Verbalized understanding          PT Short Term Goals - 01/07/17 1425      PT SHORT TERM GOAL #1   Title Pt will demo consistency and independence with HEP   Time 1   Period Weeks   Status New   Target Date 01/14/17     PT SHORT TERM GOAL #2   Title Patient to be participatory in regular progressive walking program in order to improve functional activity tolerance    Time 2   Period Weeks   Status New   Target Date 01/21/17     PT SHORT TERM GOAL #3   Title Patient and family to be able to verbalize strategies to assist in compensating for poor vision in order to reduce fall risk related to impaired perception/vision    Time 2   Period Weeks   Status New           PT Long Term Goals - 01/07/17 1427      PT LONG TERM GOAL #1   Title Patient to demonstrate MMT as being 5/5 in all tested groups in order to improve balance and reduce fall risk    Time 4   Period Weeks   Status New   Target Date 02/04/17     PT LONG TERM GOAL #2   Title Patient to be able to score at least 22/24 points on DGI in order to show improved dynamic balance/reduced fall risk    Time 4   Period Weeks   Status New     PT LONG TERM GOAL #3   Title Patient to be able to ambulate at least 667ft during 3MWT with no unsteadiness or fatigue in order to show improved  stable community access    Time 4   Period [place] - 01/07/17 1419    Clinical Impression Statement Patient arrives reporting a fall in her bathtub about one month ago, as well as ongoing unsteadiness and close calls with falling especially when performance activities such as stairs; she and her daughter report bilateral macular degeneration and cataracts which have been severely effecting patient's vision including depth perception. Examination reveals mild functional weakness as well as reduced functional activity tolerance and mild dynamic unsteadiness and gait impairment. At this point suspect that part of patient's balance deficit may be due to vision impairment and discussed technique of using brightly colored tape and glow tape on surfaces such as stairs to improve depth perception, as well as use of cane over unsteady surfaces. Recommend skilled PT services to address functional deficits and assist in reducing fall risk moving forward.    History and Personal Factors relevant to plan of care: poor vision due to cataracts and macular degeneration, success with PT in the past    Clinical Presentation Stable   Clinical Presentation due to: general unsteadiness, poor vision/depth perception    Clinical Decision Making Low   Rehab Potential Good   Clinical Impairments Affecting Rehab Potential (+) success with PT in the past, supportive family, motivated; (-) poor vision with progressive deterioration (macular degeneration), history of falls    PT Frequency 2x / week   PT Duration 4 weeks   PT Treatment/Interventions  ADLs/Self Care Home Management;DME Instruction;Gait training;Stair training;Functional mobility training;Therapeutic activities;Therapeutic exercise;Neuromuscular re-education;Balance training;Patient/family education;Manual techniques;Energy conservation;Taping   PT Next Visit Plan review initial eval/goals; assign HEP; discuss strategies to  improve depth perception/reduce fall risk (glow tape, cane outside, etc), primary focus on balance and advanced strength   PT Home Exercise Plan Need to assign 2nd session    Consulted and Agree with Plan of Care Patient      Patient will benefit from skilled therapeutic intervention in order to improve the following deficits and impairments:  Abnormal gait, Decreased coordination, Decreased mobility, Decreased activity tolerance, Decreased strength, Decreased balance, Decreased safety awareness, Difficulty walking  Visit Diagnosis: Unsteadiness on feet - Plan: PT plan of care cert/re-cert  Muscle weakness (generalized) - Plan: PT plan of care cert/re-cert  History of falling - Plan: PT plan of care cert/re-cert      G-Codes - 42/68/34 1429    Functional Assessment Tool Used (Outpatient Only) Based on skilled clinical assessment of gait, strength, balance, fall risk   Functional Limitation Mobility: Walking and moving around   Mobility: Walking and Moving Around Current Status (H9622) At least 1 percent but less than 20 percent impaired, limited or restricted   Mobility: Walking and Moving Around Goal Status (W9798) 0 percent impaired, limited or restricted       Problem List Patient Active Problem List   Diagnosis Date Noted  . Dysphagia 11/15/2016  . S/P AVR (aortic valve replacement) 04/13/2016  . Encounter for therapeutic drug monitoring 01/02/2016  . Hx of TIA (transient ischemic attack) and stroke 12/06/2015  . Type II diabetes mellitus ([place])   . Osteoarthritis   . Fibromyalgia   . Atrial fibrillation ([place])   . CHF (congestive heart failure) ([place])   . Esophageal spasm   . Numbness on right side 05/03/2015  . TIA (transient ischemic attack) 05/02/2015  . Hypertension 05/02/2015    Deniece Ree PT, DPT [place] [address] [place], [place], [address] Phone: [phone]   Fax:  [phone]  Name:  Jeanette Yates MRN: [id number] Date of Birth: [birth year]

## 2017-01-09 ENCOUNTER — Encounter (HOSPITAL_COMMUNITY)
Admission: RE | Disposition: A | Discharge status: Home / Self Care | Payer: Self-pay | Source: Ambulatory Visit | Attending: Internal Medicine

## 2017-01-09 ENCOUNTER — Encounter (HOSPITAL_COMMUNITY): Payer: Self-pay | Admitting: *Deleted

## 2017-01-09 ENCOUNTER — Ambulatory Visit (HOSPITAL_COMMUNITY)
Admission: RE | Admit: 2017-01-09 | Discharge: 2017-01-09 | Disposition: A | Discharge status: Home / Self Care | Payer: Medicare Other | Source: Ambulatory Visit | Attending: Internal Medicine | Admitting: Internal Medicine

## 2017-01-09 ENCOUNTER — Ambulatory Visit (HOSPITAL_COMMUNITY): Payer: Medicare Other | Admitting: Anesthesiology

## 2017-01-09 DIAGNOSIS — Z9889 Other specified postprocedural states: Secondary | ICD-10-CM | POA: Insufficient documentation

## 2017-01-09 DIAGNOSIS — E7800 Pure hypercholesterolemia, unspecified: Secondary | ICD-10-CM | POA: Insufficient documentation

## 2017-01-09 DIAGNOSIS — Z952 Presence of prosthetic heart valve: Secondary | ICD-10-CM | POA: Diagnosis not present

## 2017-01-09 DIAGNOSIS — Z951 Presence of aortocoronary bypass graft: Secondary | ICD-10-CM | POA: Diagnosis not present

## 2017-01-09 DIAGNOSIS — Z8249 Family history of ischemic heart disease and other diseases of the circulatory system: Secondary | ICD-10-CM | POA: Insufficient documentation

## 2017-01-09 DIAGNOSIS — H3530 Unspecified macular degeneration: Secondary | ICD-10-CM | POA: Diagnosis not present

## 2017-01-09 DIAGNOSIS — I2510 Atherosclerotic heart disease of native coronary artery without angina pectoris: Secondary | ICD-10-CM | POA: Insufficient documentation

## 2017-01-09 DIAGNOSIS — Z8673 Personal history of transient ischemic attack (TIA), and cerebral infarction without residual deficits: Secondary | ICD-10-CM | POA: Diagnosis not present

## 2017-01-09 DIAGNOSIS — K2950 Unspecified chronic gastritis without bleeding: Secondary | ICD-10-CM | POA: Diagnosis not present

## 2017-01-09 DIAGNOSIS — Z91040 Latex allergy status: Secondary | ICD-10-CM | POA: Insufficient documentation

## 2017-01-09 DIAGNOSIS — R011 Cardiac murmur, unspecified: Secondary | ICD-10-CM | POA: Insufficient documentation

## 2017-01-09 DIAGNOSIS — E119 Type 2 diabetes mellitus without complications: Secondary | ICD-10-CM | POA: Diagnosis not present

## 2017-01-09 DIAGNOSIS — R1310 Dysphagia, unspecified: Secondary | ICD-10-CM | POA: Diagnosis not present

## 2017-01-09 DIAGNOSIS — I110 Hypertensive heart disease with heart failure: Secondary | ICD-10-CM | POA: Diagnosis not present

## 2017-01-09 DIAGNOSIS — Z880 Allergy status to penicillin: Secondary | ICD-10-CM | POA: Insufficient documentation

## 2017-01-09 DIAGNOSIS — Z885 Allergy status to narcotic agent status: Secondary | ICD-10-CM | POA: Insufficient documentation

## 2017-01-09 DIAGNOSIS — Z79899 Other long term (current) drug therapy: Secondary | ICD-10-CM | POA: Diagnosis not present

## 2017-01-09 DIAGNOSIS — Z90710 Acquired absence of both cervix and uterus: Secondary | ICD-10-CM | POA: Insufficient documentation

## 2017-01-09 DIAGNOSIS — Z96653 Presence of artificial knee joint, bilateral: Secondary | ICD-10-CM | POA: Diagnosis not present

## 2017-01-09 DIAGNOSIS — I509 Heart failure, unspecified: Secondary | ICD-10-CM | POA: Insufficient documentation

## 2017-01-09 DIAGNOSIS — F4320 Adjustment disorder, unspecified: Secondary | ICD-10-CM | POA: Insufficient documentation

## 2017-01-09 DIAGNOSIS — I4891 Unspecified atrial fibrillation: Secondary | ICD-10-CM | POA: Insufficient documentation

## 2017-01-09 DIAGNOSIS — Z7982 Long term (current) use of aspirin: Secondary | ICD-10-CM | POA: Diagnosis not present

## 2017-01-09 DIAGNOSIS — Z7901 Long term (current) use of anticoagulants: Secondary | ICD-10-CM | POA: Insufficient documentation

## 2017-01-09 DIAGNOSIS — K2970 Gastritis, unspecified, without bleeding: Secondary | ICD-10-CM | POA: Diagnosis not present

## 2017-01-09 DIAGNOSIS — Z87891 Personal history of nicotine dependence: Secondary | ICD-10-CM | POA: Insufficient documentation

## 2017-01-09 DIAGNOSIS — M797 Fibromyalgia: Secondary | ICD-10-CM | POA: Diagnosis not present

## 2017-01-09 DIAGNOSIS — Z7984 Long term (current) use of oral hypoglycemic drugs: Secondary | ICD-10-CM | POA: Insufficient documentation

## 2017-01-09 DIAGNOSIS — M1990 Unspecified osteoarthritis, unspecified site: Secondary | ICD-10-CM | POA: Insufficient documentation

## 2017-01-09 DIAGNOSIS — Z888 Allergy status to other drugs, medicaments and biological substances status: Secondary | ICD-10-CM | POA: Insufficient documentation

## 2017-01-09 HISTORY — PX: ESOPHAGOGASTRODUODENOSCOPY (EGD) WITH PROPOFOL: SHX5813

## 2017-01-09 HISTORY — PX: MALONEY DILATION: SHX5535

## 2017-01-09 LAB — GLUCOSE, CAPILLARY
GLUCOSE-CAPILLARY: 98 mg/dL (ref 65–99)
Glucose-Capillary: 104 mg/dL — ABNORMAL HIGH (ref 65–99)

## 2017-01-09 SURGERY — ESOPHAGOGASTRODUODENOSCOPY (EGD) WITH PROPOFOL
Anesthesia: Monitor Anesthesia Care

## 2017-01-09 MED ORDER — CHLORHEXIDINE GLUCONATE CLOTH 2 % EX PADS: 6.0000 | MEDICATED_PAD | Freq: Once | CUTANEOUS | Status: DC

## 2017-01-09 MED ORDER — MIDAZOLAM HCL 2 MG/2ML IJ SOLN
1.0000 mg | INTRAMUSCULAR | Status: AC
  Administered 2017-01-09: 2 mg via INTRAVENOUS

## 2017-01-09 MED ORDER — PROPOFOL 500 MG/50ML IV EMUL
INTRAVENOUS | Status: DC | PRN
  Administered 2017-01-09: 150 ug/kg/min via INTRAVENOUS

## 2017-01-09 MED ORDER — LIDOCAINE VISCOUS 2 % MT SOLN
3.0000 mL | OROMUCOSAL | Status: AC | PRN
  Administered 2017-01-09 (×2): 3 mL via OROMUCOSAL

## 2017-01-09 MED ORDER — LIDOCAINE HCL (PF) 1 % IJ SOLN
INTRAMUSCULAR | Status: AC
  Filled 2017-01-09: qty 5

## 2017-01-09 MED ORDER — FENTANYL CITRATE (PF) 100 MCG/2ML IJ SOLN
INTRAMUSCULAR | Status: AC
  Filled 2017-01-09: qty 2

## 2017-01-09 MED ORDER — FENTANYL CITRATE (PF) 100 MCG/2ML IJ SOLN
25.0000 ug | Freq: Once | INTRAMUSCULAR | Status: AC
  Administered 2017-01-09: 25 ug via INTRAVENOUS

## 2017-01-09 MED ORDER — LACTATED RINGERS IV SOLN
INTRAVENOUS | Status: DC
  Administered 2017-01-09: 1000 mL via INTRAVENOUS

## 2017-01-09 MED ORDER — MIDAZOLAM HCL 2 MG/2ML IJ SOLN
INTRAMUSCULAR | Status: AC
  Filled 2017-01-09: qty 2

## 2017-01-09 MED ORDER — ONDANSETRON HCL 4 MG/2ML IJ SOLN
4.0000 mg | Freq: Once | INTRAMUSCULAR | Status: AC
  Administered 2017-01-09: 4 mg via INTRAVENOUS

## 2017-01-09 MED ORDER — LIDOCAINE VISCOUS 2 % MT SOLN
OROMUCOSAL | Status: AC
  Filled 2017-01-09: qty 15

## 2017-01-09 MED ORDER — STERILE WATER FOR IRRIGATION IR SOLN
Status: DC | PRN
  Administered 2017-01-09: 2.5 mL

## 2017-01-09 MED ORDER — PROPOFOL 10 MG/ML IV BOLUS
INTRAVENOUS | Status: AC
  Filled 2017-01-09: qty 20

## 2017-01-09 MED ORDER — ONDANSETRON HCL 4 MG/2ML IJ SOLN
INTRAMUSCULAR | Status: AC
  Filled 2017-01-09: qty 2

## 2017-01-09 NOTE — H&P (Signed)
@LOGO @   Primary Care Physician:  Gayland Curry, DO Primary Gastroenterologist:  Dr. Gala Romney  Pre-Procedure History & Physical: HPI:  Jeanette Yates is a 76 y.o. female here for EGD with possible esophageal dilation.  Recurrent dysphagia since Nissen fundoplicatiion 30 years ago.  Lovenox-last dose yesterday. Nooumadin x 5 days  Past Medical History:  Diagnosis Date  . Adjustment disorder 10/17/2005  . Atrial fibrillation ([place])   . Cataract    bilateral  . CHF (congestive heart failure) ([place])   . Contracture of knee joint 07/26/2009  . Coronary artery disease   . Degeneration of lumbar or lumbosacral intervertebral disc 07/26/2009  . Diabetes mellitus without complication ([place])   . Esophageal spasm   . Esophageal spasm   . Fibromyalgia   . Fibromyalgia   . Hearing loss 12/18/2010  . Heart murmur   . Hypercholesteremia   . Hypertension   . 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 Leaky heart valve   . Lyme disease   . Macular degeneration   . Macular degeneration of both eyes   . Migraine 10/17/2005  . Mixed incontinence 10/17/2005  . Osteoarthritis    multiple joints   . Overweight 09/19/2010  . Pneumonia   . PONV (postoperative nausea and vomiting)   . Postartificial menopausal syndrome 10/10/2009  . Prinzmetal angina ([place]) 10/17/2005  . Psoriasis 06/12/2006  . Sciatica   . TIA (transient ischemic attack)   . Type II diabetes mellitus ([employer])   . [name] 10/17/2005    Past Surgical History:  Procedure Laterality Date  . ABDOMINAL HYSTERECTOMY    . AORTIC VALVE REPLACEMENT  12/10/2013   porcine  . CHOLECYSTECTOMY  1981  . COLONOSCOPY  04/18/2006   Dr. Nelva Nay: internal hemorrhoids  . COLONOSCOPY  10/2003   Dr. Alphonsa Gin: hemorrhoids  . CORONARY ARTERY BYPASS GRAFT  2015  . ESOPHAGOGASTRODUODENOSCOPY  10/16/2013   Dr. Marla Roe: prior Nissen fundoplication intact, hypertonic LES, dilated up to 54 [place] with moderate resistance,  gastritis but no H. pylori., Reactive gastritis, no celiac disease.  . ESOPHAGOGASTRODUODENOSCOPY  05/29/2012   Dr. Doy Mince: Moderately severe esophagitis, acute gastritis reactive, no H pylori, no celiac. Esophageal biopsies consistent with GERD, no Barrett  . ESOPHAGOGASTRODUODENOSCOPY  03/21/2009   Dr. Doy Mince: Reflux esophagitis, gastritis without H. pylori, esophagus stretched 57 savory  . ESOPHAGOGASTRODUODENOSCOPY  02/20/2008   Dr. Doy Mince: Esophagus dilated to 26 French, reactive gastropathy with no H pylori. No Barrett's on esophageal biopsy  . ESOPHAGOGASTRODUODENOSCOPY  09/24/2006   Dr. Doy Mince: Tight wrap noted, reactive gastropathy, no Barrett's  . FRACTURE SURGERY Left    wrist  . JOINT REPLACEMENT    . MITRAL VALVE REPAIR  11/2013  . NISSEN FUNDOPLICATION  5284  . REPLACEMENT TOTAL KNEE BILATERAL  2010/2013    Prior to Admission medications   Medication Sig Start Date End Date Taking? Authorizing Provider  acetaminophen (TYLENOL) 650 MG CR tablet Take 650-1,300 mg by mouth every 8 (eight) hours as needed for pain.    Yes [provider]  aspirin EC 81 MG tablet Take 81 mg by mouth every morning.    Yes [provider]  atorvastatin (LIPITOR) 80 MG tablet Take 1 tablet (80 mg total) by mouth daily. 05/11/16  Yes Reed, Tiffany L, DO  Calcium Carb-Cholecalciferol (CALTRATE 600+D3 SOFT PO) Take 1 tablet by mouth 2 (two) times daily.    Yes [provider]  cetirizine (ZYRTEC) 10 MG tablet Take 10 mg  by mouth daily as needed for allergies.    Yes [provider]  Cholecalciferol (VITAMIN D3) 2000 units TABS Take 2,000 Units by mouth daily.    Yes [provider]  diphenhydrAMINE-zinc acetate (BENADRYL) cream Apply 1 application topically 3 (three) times daily as needed for itching.   Yes [provider]  enoxaparin (LOVENOX) 120 MG/0.8ML injection Inject 0.8 mLs (120 mg total) into the skin daily. At 8am 01/05/17 01/15/17  Yes Branch, Alphonse Guild, MD  fluticasone (FLONASE) 50 MCG/ACT nasal spray Place 2 sprays into both nostrils daily. Patient taking differently: Place 2 sprays into both nostrils daily as needed for allergies.  10/11/16  Yes Reed, Tiffany L, DO  furosemide (LASIX) 20 MG tablet Take 20 mg daily as needed for leg swelling Patient taking differently: Take 20 mg by mouth 2 (two) times daily.  10/08/16  Yes BranchAlphonse Guild, MD  gabapentin (NEURONTIN) 100 MG capsule Take 1 capsule (100 mg total) by mouth every morning. Take 2-3 tablets by mouth at bedtime Patient taking differently: Take 200-300 mg by mouth See admin instructions. Take 200 mg by mouth at bedtime. May take an addition 100 mg = 300 mg dose as needed for pain. May take 100 mg by mouth in the morning as needed for pain. 11/07/16  Yes Reed, Tiffany L, DO  loperamide (IMODIUM A-D) 2 MG tablet Take 2 mg by mouth as needed for diarrhea or loose stools.   Yes [provider]  losartan (COZAAR) 100 MG tablet Take 1 tablet (100 mg total) by mouth daily. 10/18/16  Yes Reed, Tiffany L, DO  Melatonin 10 MG TABS Take 10 mg by mouth at bedtime.    Yes [provider]  metFORMIN (GLUCOPHAGE) 500 MG tablet Take 1 tablet (500 mg total) by mouth 2 (two) times daily with a meal. 8am and 7pm 05/11/16  Yes Reed, Tiffany L, DO  metoprolol tartrate (LOPRESSOR) 25 MG tablet Take 0.5 tablets (12.5 mg total) by mouth 2 (two) times daily. 8 am & 7 pm Patient taking differently: Take 12.5 mg by mouth See admin instructions. Take 12.5 mg by mouth twice daily. May take an additional 12.5 mg by mouth as needed for arrhythmia 05/11/16  Yes Reed, Tiffany L, DO  potassium chloride (K-DUR) 10 MEQ tablet Take potassium 10 meq only on days you take lasix Patient taking differently: Take 10 mEq by mouth 2 (two) times daily.  10/08/16  Yes BranchAlphonse Guild, MD  warfarin (COUMADIN) 6 MG tablet Take 1 tablet (6 mg total) by mouth daily. 05/11/16   Reed, Tiffany L, DO     Allergies as of 12/21/2016 - Review Complete 12/17/2016  Allergen Reaction Noted  . Penicillins Anaphylaxis 05/02/2015  . Latex  11/15/2015  . Morphine and related  07/26/2015  . Tape  07/26/2015  . Iodine  11/15/2015    Family History  Problem Relation Age of Onset  . Stroke Mother   . Heart failure Mother   . Heart disease Mother   . Mitral valve prolapse Mother   . Diabetes Father   . Heart attack Father   . Stroke Father   . Heart disease Father   . Hypertension Father   . Alzheimer's disease Father   . Stroke Maternal Grandmother   . Arthritis Maternal Grandfather        hands  . Breast cancer Paternal Grandmother   . Osteoporosis Paternal Grandmother   . Breast cancer Cousin   . Colon cancer Neg  Hx     Social History   Social History  . Marital status: Widowed    Spouse name: N/A  . Number of children: 3  . Years of education: N/A   Occupational History  . Not on file.   Social History Main Topics  . Smoking status: Former Smoker    Packs/day: 1.00    Years: 20.00    Types: Cigarettes    Quit date: 09/12/1966  . Smokeless tobacco: Never Used  . Alcohol use No  . Drug use: No  . Sexual activity: No   Other Topics Concern  . Not on file   Social History Narrative  . No narrative on file    Review of Systems: See HPI, otherwise negative ROS  Physical Exam: BP (!) 171/83   Pulse (!) 56   Temp 98.5 F (36.9 C) (Oral)   Resp 15   SpO2 96%  General:   Alert,  Well-developed, well-nourished, pleasant and cooperative in NAD SNeck:  Supple; no masses or thyromegaly. No significant cervical adenopathy. Lungs:  Clear throughout to auscultation.   No wheezes, crackles, or rhonchi. No acute distress. Heart:  Regular rate and rhythm; no murmurs, clicks, rubs,  or gallops. Abdomen: Non-distended, normal bowel sounds.  Soft and nontender without appreciable mass or hepatosplenomegaly.  Pulses:  Normal pulses noted. Extremities:  Without clubbing or  edema.  1. Impression:  Recurrent esophageal dysphagia in a patient with a historically somewhat tight Nissan fundoplication. I have offered the patient an EGD with ED as feasible/appropriate.  The risks, benefits, limitations, alternatives and imponderables have been reviewed with the patient. Potential for esophageal dilation, biopsy, etc. have also been reviewed.  Questions have been answered. All parties agreeable.  Further recommendations to follow.   Notice: This dictation was prepared with Dragon dictation along with smaller phrase technology. Any transcriptional errors that result from this process are unintentional and may not be corrected upon review.

## 2017-01-09 NOTE — Anesthesia Postprocedure Evaluation (Signed)
Anesthesia Post Note  Patient: Jeanette Yates  Procedure(s) Performed: ESOPHAGOGASTRODUODENOSCOPY (EGD) WITH PROPOFOL (N/A ) MALONEY DILATION (N/A )  Patient location during evaluation: PACU Anesthesia Type: MAC Level of consciousness: awake and alert, oriented and patient cooperative Pain management: pain level controlled Vital Signs Assessment: post-procedure vital signs reviewed and stable Respiratory status: spontaneous breathing Cardiovascular status: stable Postop Assessment: no apparent nausea or vomiting Anesthetic complications: no     Last Vitals:  Vitals:   01/09/17 1400 01/09/17 1435  BP: (!) 175/76   Pulse:  (!) (P) 52  Resp: 20 (P) 18  Temp:  (P) 36.7 C  SpO2: 97% (P) 100%    Last Pain:  Vitals:   01/09/17 1319  TempSrc: Oral                 Nicholous Girgenti A

## 2017-01-09 NOTE — Anesthesia Procedure Notes (Signed)
Procedure Name: MAC Date/Time: 01/09/2017 2:15 PM Performed by: Andree Elk, Karra Pink A Pre-anesthesia Checklist: Patient identified, Emergency Drugs available, Suction available, Patient being monitored and Timeout performed Oxygen Delivery Method: Simple face mask

## 2017-01-09 NOTE — Op Note (Signed)
[facility] Patient Name: Jeanette Yates Procedure Date: 01/09/2017 1:48 PM MRN: [id number] Date of Birth: 12/04/1940 Attending MD: Norvel Richards , MD CSN: [id number] Age: 76 Admit Type: Outpatient Procedure:                Upper GI endoscopy Indications:              Dysphagia Providers:                Norvel Richards, MD, Selena Lesser, Aram Candela Referring MD:             Rozann Lesches, MD Medicines:                Propofol per Anesthesia Complications:            No immediate complications. Estimated Blood Loss:     Estimated blood loss was minimal. Procedure:                Pre-Anesthesia Assessment:                           - Prior to the procedure, a History and Physical                            was performed, and patient medications and                            allergies were reviewed. The patient's tolerance of                            previous anesthesia was also reviewed. The risks                            and benefits of the procedure and the sedation                            options and risks were discussed with the patient.                            All questions were answered, and informed consent                            was obtained. Prior Anticoagulants: The patient                            last took Coumadin (warfarin) 5 days prior to the                            procedure. ASA Grade Assessment: III - A patient                            with severe systemic disease. After reviewing the  risks and benefits, the patient was deemed in                            satisfactory condition to undergo the procedure.                           After obtaining informed consent, the endoscope was                            passed under direct vision. Throughout the                            procedure, the patient's blood pressure, pulse, and                            oxygen saturations  were monitored continuously. The                            EG-[id number] ([id number]) scope was introduced through the                            and advanced to the second part of duodenum. The                            upper GI endoscopy was accomplished without                            difficulty. The patient tolerated the procedure                            well. Scope In: 2:22:01 PM Scope Out: 2:28:02 PM Total Procedure Duration: 0 hours 6 minutes 1 second  Findings:      The examined esophagus was normal. The scope was withdrawn. Dilation was       performed with a Maloney dilator with mild resistance at 56 Fr. The       scope was withdrawn. Dilation was performed with a Maloney dilator with       mild resistance at 82 Fr. The dilation site was examined following       endoscope reinsertion and showed no change. Estimated blood loss: none.      Inflammation was found in the entire examined stomach. Evidence of prior       fundoplication noted retroflexed. abnormal gastric mucosa was biopsied       with a cold forceps for histology. Estimated blood loss was minimal.      The duodenal bulb and second portion of the duodenum were normal. Impression:               status post prior fundoplication.                           - Normal esophagus. Dilated.                           - Gastritis. Biopsied.                           -  Normal second portion of the duodenum. Moderate Sedation:      Moderate (conscious) sedation was personally administered by an       anesthesia professional. The following parameters were monitored: oxygen       saturation, heart rate, blood pressure, respiratory rate, EKG, adequacy       of pulmonary ventilation, and response to care. Total physician       intraservice time was 11 minutes. Recommendation:           - Patient has a contact number available for                            emergencies. The signs and symptoms of potential                             delayed complications were discussed with the                            patient. Return to normal activities tomorrow.                            Written discharge instructions were provided to the                            patient.                           - Resume previous diet.                           - Continue present medications. Resume Lovenox and                            Coumadin per cardiology clinic.                           - Await pathology results.                           - Repeat upper endoscopy.                           - Return to GI clinic in 4 months. Procedure Code(s):        --- Professional ---                           [phone], Esophagogastroduodenoscopy, flexible,                            transoral; with biopsy, single or multiple                           43450, Dilation of esophagus, by unguided sound or                            bougie, single or multiple passes Diagnosis Code(s):        --- Professional ---  K29.70, Gastritis, unspecified, without bleeding                           R13.10, Dysphagia, unspecified CPT copyright 2016 American Medical Association. All rights reserved. The codes documented in this report are preliminary and upon coder review may  be revised to meet current compliance requirements. Cristopher Estimable. Rourk, MD Norvel Richards, MD 01/09/2017 2:39:13 PM This report has been signed electronically. Number of Addenda: 0

## 2017-01-09 NOTE — Transfer of Care (Signed)
Immediate Anesthesia Transfer of Care Note  Patient: Jeanette Yates  Procedure(s) Performed: ESOPHAGOGASTRODUODENOSCOPY (EGD) WITH PROPOFOL (N/A ) MALONEY DILATION (N/A )  Patient Location: PACU  Anesthesia Type:MAC  Level of Consciousness: awake, oriented and patient cooperative  Airway & Oxygen Therapy: Patient Spontanous Breathing and Patient connected to nasal cannula oxygen  Post-op Assessment: Report given to RN and Post -op Vital signs reviewed and stable  Post vital signs: Reviewed and stable  Last Vitals:  Vitals:   01/09/17 1345 01/09/17 1400  BP: (!) 171/83 (!) 175/76  Pulse:    Resp: 15 20  Temp:    SpO2: 96% 97%    Last Pain:  Vitals:   01/09/17 1319  TempSrc: Oral      Patients Stated Pain Goal: 7 (65/68/12 7517)  Complications: No apparent anesthesia complications

## 2017-01-09 NOTE — Anesthesia Preprocedure Evaluation (Addendum)
Anesthesia Evaluation  Patient identified by MRN, date of birth, ID band Patient awake    Reviewed: Allergy & Precautions, NPO status , Patient's Chart, lab work & pertinent test results  History of Anesthesia Complications (+) PONV and history of anesthetic complications  Airway Mallampati: II  TM Distance: >3 FB Neck ROM: Full    Dental  (+) Teeth Intact   Pulmonary former smoker,    breath sounds clear to auscultation       Cardiovascular hypertension, Pt. on medications + CAD, + CABG and +CHF  + Valvular Problems/Murmurs ( AVR + MVR, on coumadin Rx)  Rhythm:Regular Rate:Normal     Neuro/Psych  Headaches, PSYCHIATRIC DISORDERS TIA Neuromuscular disease    GI/Hepatic negative GI ROS, Neg liver ROS,   Endo/Other  diabetes, Type 2  Renal/GU negative Renal ROS     Musculoskeletal  (+) Fibromyalgia -  Abdominal   Peds  Hematology   Anesthesia Other Findings   Reproductive/Obstetrics                             Anesthesia Physical Anesthesia Plan  ASA: III  Anesthesia Plan: MAC   Post-op Pain Management:    Induction: Intravenous  PONV Risk Score and Plan:   Airway Management Planned: Simple Face Mask  Additional Equipment:   Intra-op Plan:   Post-operative Plan:   Informed Consent: I have reviewed the patients History and Physical, chart, labs and discussed the procedure including the risks, benefits and alternatives for the proposed anesthesia with the patient or authorized representative who has indicated his/her understanding and acceptance.     Plan Discussed with:   Anesthesia Plan Comments:         Anesthesia Quick Evaluation

## 2017-01-09 NOTE — Discharge Instructions (Signed)
EGD Discharge instructions Please read the instructions outlined below and refer to this sheet in the next few weeks. These discharge instructions provide you with general information on caring for yourself after you leave the hospital. Your doctor may also give you specific instructions. While your treatment has been planned according to the most current medical practices available, unavoidable complications occasionally occur. If you have any problems or questions after discharge, please call your doctor. ACTIVITY  You may resume your regular activity but move at a slower pace for the next 24 hours.   Take frequent rest periods for the next 24 hours.   Walking will help expel (get rid of) the air and reduce the bloated feeling in your abdomen.   No driving for 24 hours (because of the anesthesia (medicine) used during the test).   You may shower.   Do not sign any important legal documents or operate any machinery for 24 hours (because of the anesthesia used during the test).  NUTRITION  Drink plenty of fluids.   You may resume your normal diet.   Begin with a light meal and progress to your normal diet.   Avoid alcoholic beverages for 24 hours or as instructed by your caregiver.  MEDICATIONS  You may resume your normal medications unless your caregiver tells you otherwise.  WHAT YOU CAN EXPECT TODAY  You may experience abdominal discomfort such as a feeling of fullness or gas pains.  FOLLOW-UP  Your doctor will discuss the results of your test with you.  SEEK IMMEDIATE MEDICAL ATTENTION IF ANY OF THE FOLLOWING OCCUR:  Excessive nausea (feeling sick to your stomach) and/or vomiting.   Severe abdominal pain and distention (swelling).   Trouble swallowing.   Temperature over 101 F (37.8 C).   Rectal bleeding or vomiting of blood.    Further recommendations to follow pending review of pathology report  Office visit with [place] in 4 mo  Resume Lovenox and Coumadin  today.  Follow instructions of the Coumadin clinic regarding future dosing.

## 2017-01-10 NOTE — Progress Notes (Signed)
Please find out how the patient's diarrhea is.  She did have enteropathogenic Ecoli. Typically does not require antibiotics unless prolonged diarrheal illness.

## 2017-01-14 ENCOUNTER — Encounter (HOSPITAL_COMMUNITY): Payer: Self-pay | Admitting: Internal Medicine

## 2017-01-14 ENCOUNTER — Ambulatory Visit (INDEPENDENT_AMBULATORY_CARE_PROVIDER_SITE_OTHER): Payer: Medicare Other | Admitting: *Deleted

## 2017-01-14 ENCOUNTER — Ambulatory Visit (HOSPITAL_COMMUNITY): Payer: Medicare Other

## 2017-01-14 DIAGNOSIS — Z8673 Personal history of transient ischemic attack (TIA), and cerebral infarction without residual deficits: Secondary | ICD-10-CM | POA: Diagnosis not present

## 2017-01-14 DIAGNOSIS — Z5181 Encounter for therapeutic drug level monitoring: Secondary | ICD-10-CM

## 2017-01-14 DIAGNOSIS — Z9181 History of falling: Secondary | ICD-10-CM | POA: Diagnosis not present

## 2017-01-14 DIAGNOSIS — R2681 Unsteadiness on feet: Secondary | ICD-10-CM

## 2017-01-14 DIAGNOSIS — M79605 Pain in left leg: Secondary | ICD-10-CM | POA: Diagnosis not present

## 2017-01-14 DIAGNOSIS — I4891 Unspecified atrial fibrillation: Secondary | ICD-10-CM

## 2017-01-14 DIAGNOSIS — M6281 Muscle weakness (generalized): Secondary | ICD-10-CM

## 2017-01-14 DIAGNOSIS — R29898 Other symptoms and signs involving the musculoskeletal system: Secondary | ICD-10-CM | POA: Diagnosis not present

## 2017-01-14 LAB — POCT INR: INR: 2.8

## 2017-01-14 NOTE — Therapy (Signed)
[place], [place], [address] Phone: [phone]   Fax:  [phone]  Physical Therapy Treatment  Patient Details  Name: Jeanette Yates MRN: [id number] Date of Birth: 1940/04/26 Referring Provider: Hollace Kinnier   Encounter Date: 01/14/2017      PT End of Session - 01/14/17 1348    Visit Number 2   Number of Visits 9   Date for PT Re-Evaluation 02/04/17   Authorization Type Medicare and AARP    Authorization Time Period 01/07/17 to 02/07/17   Authorization - Visit Number 2   Authorization - Number of Visits 10   PT Start Time 1302   PT Stop Time 1344   PT Time Calculation (min) 42 min   Activity Tolerance Patient tolerated treatment well   Behavior During Therapy [facility] for tasks assessed/performed      Past Medical History:  Diagnosis Date  . Adjustment disorder 10/17/2005  . Atrial fibrillation ([place])   . Cataract    bilateral  . CHF (congestive heart failure) ([place])   . Contracture of knee joint 07/26/2009  . Coronary artery disease   . Degeneration of lumbar or lumbosacral intervertebral disc 07/26/2009  . Diabetes mellitus without complication ([place])   . Esophageal spasm   . Esophageal spasm   . Fibromyalgia   . Fibromyalgia   . Hearing loss 12/18/2010  . Heart murmur   . Hypercholesteremia   . Hypertension   . 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 Leaky heart valve   . Lyme disease   . Macular degeneration   . Macular degeneration of both eyes   . Migraine 10/17/2005  . Mixed incontinence 10/17/2005  . Osteoarthritis    multiple joints   . Overweight 09/19/2010  . Pneumonia   . PONV (postoperative nausea and vomiting)   . Postartificial menopausal syndrome 10/10/2009  . Prinzmetal angina ([place]) 10/17/2005  . Psoriasis 06/12/2006  . Sciatica   . TIA (transient ischemic attack)   . Type II diabetes mellitus ([facility])   . [name] 10/17/2005    Past Surgical  History:  Procedure Laterality Date  . ABDOMINAL HYSTERECTOMY    . AORTIC VALVE REPLACEMENT  12/10/2013   porcine  . CHOLECYSTECTOMY  1981  . COLONOSCOPY  04/18/2006   Dr. Nelva Nay: internal hemorrhoids  . COLONOSCOPY  10/2003   Dr. Alphonsa Gin: hemorrhoids  . CORONARY ARTERY BYPASS GRAFT  2015  . ESOPHAGOGASTRODUODENOSCOPY  10/16/2013   Dr. Marla Roe: prior Nissen fundoplication intact, hypertonic LES, dilated up to 67 [place] with moderate resistance, gastritis but no H. pylori., Reactive gastritis, no celiac disease.  . ESOPHAGOGASTRODUODENOSCOPY  05/29/2012   Dr. Doy Mince: Moderately severe esophagitis, acute gastritis reactive, no H pylori, no celiac. Esophageal biopsies consistent with GERD, no Barrett  . ESOPHAGOGASTRODUODENOSCOPY  03/21/2009   Dr. Doy Mince: Reflux esophagitis, gastritis without H. pylori, esophagus stretched 57 savory  . ESOPHAGOGASTRODUODENOSCOPY  02/20/2008   Dr. Doy Mince: Esophagus dilated to 81 French, reactive gastropathy with no H pylori. No Barrett's on esophageal biopsy  . ESOPHAGOGASTRODUODENOSCOPY  09/24/2006   Dr. Doy Mince: Tight wrap noted, reactive gastropathy, no Barrett's  . ESOPHAGOGASTRODUODENOSCOPY (EGD) WITH PROPOFOL N/A 01/09/2017   Procedure: ESOPHAGOGASTRODUODENOSCOPY (EGD) WITH PROPOFOL;  Surgeon: Daneil Dolin, MD;  [facility];  Service: Endoscopy;  Laterality: N/A;  2:15PM  . FRACTURE SURGERY Left    wrist  . JOINT REPLACEMENT    . MALONEY DILATION N/A 01/09/2017   Procedure: Venia Minks  DILATION;  Surgeon: Daneil Dolin, MD;  Location: [facility];  Service: Endoscopy;  Laterality: N/A;  . MITRAL VALVE REPAIR  11/2013  . NISSEN FUNDOPLICATION  1062  . REPLACEMENT TOTAL KNEE BILATERAL  2010/2013    There were no vitals filed for this visit.      Subjective Assessment - 01/14/17 1307    Subjective Pt reports no major updates sinc elast visit, butshe has been experieincing some intermittent sharp shoulder pain. She  reports that its left arm this time, but typicall yher Right arm.    Pertinent History history TIA, history A-fib, CHF, DM, fibromyalgia, CHF, history chornic back pain, macular degeneration and cataracts impairing vision, HTN    Currently in Pain? Yes   Pain Score 2   left upper arm    Pain Orientation Left   Pain Descriptors / Indicators Aching   Aggravating Factors  reach forward or overhead                          [facility] Adult PT Treatment/Exercise - 01/14/17 0001      Exercises   Exercises Knee/Hip     Knee/Hip Exercises: Standing   Heel Raises 2 sets;15 reps  (HEP)    Other Standing Knee Exercises SIde stepping (Blue TB) 1x70ft  (added to HEP)     Knee/Hip Exercises: Seated   Knee/Hip Flexion 10x3sH bilat alternating (HEP)    Sit to Sand 15 reps;2 sets  (HEP)      Knee/Hip Exercises: Supine   Bridges 2 sets;10 reps;Both  glute max brige (HEP)                   PT Short Term Goals - 01/14/17 1309      PT SHORT TERM GOAL #1   Title Pt will demo consistency and independence with HEP   Time 1   Period Weeks   Status On-going     PT SHORT TERM GOAL #2   Title Patient to be participatory in regular progressive walking program in order to improve functional activity tolerance    Time 2   Period Weeks   Status On-going     PT SHORT TERM GOAL #3   Title Patient and family to be able to verbalize strategies to assist in compensating for poor vision in order to reduce fall risk related to impaired perception/vision.    Time 2   Period Weeks   Status On-going           PT Long Term Goals - 01/14/17 1311      PT LONG TERM GOAL #1   Title Patient to demonstrate MMT as being 5/5 in all tested groups in order to improve balance and reduce fall risk    Time 4   Period Weeks   Status On-going     PT LONG TERM GOAL #2   Title Patient to be able to score at least 22/24 points on DGI in order to show improved dynamic balance/reduced fall  risk    Time 4   Period Weeks   Status On-going     PT LONG TERM GOAL #3   Title Patient to be able to ambulate at least 652ft during 3MWT with no unsteadiness or fatigue in order to show improved stable community access    Time 4   Period Weeks   Status On-going  Plan - 01/14/17 1349    Clinical Impression Statement Reviewed goals, Taught HEP, performed 2 reps. Unable o get to visual home modification for impaired, but will review next visit. Pt performing well, large print required fo rHEP. mild knee pain with sidestepping inititally but improved with posture modificaiton.    Rehab Potential Good   Clinical Impairments Affecting Rehab Potential (+) success with PT in the past, supportive family, motivated; (-) poor vision with progressive deterioration (macular degeneration), history of falls    PT Frequency 2x / week   PT Duration 4 weeks   PT Treatment/Interventions ADLs/Self Care Home Management;DME Instruction;Gait training;Stair training;Functional mobility training;Therapeutic activities;Therapeutic exercise;Neuromuscular re-education;Balance training;Patient/family education;Manual techniques;Energy conservation;Taping   PT Next Visit Plan review HEP and get feedback; education on home modification for visually impaired, started balance training activity.    PT Home Exercise Plan See attachment    Consulted and Agree with Plan of Care Patient;Family member/caregiver   Family Member Consulted daughter      Patient will benefit from skilled therapeutic intervention in order to improve the following deficits and impairments:  Abnormal gait, Decreased coordination, Decreased mobility, Decreased activity tolerance, Decreased strength, Decreased balance, Decreased safety awareness, Difficulty walking  Visit Diagnosis: Unsteadiness on feet  Muscle weakness (generalized)  History of falling  Pain in left leg  Other symptoms and signs involving the  musculoskeletal system     Problem List Patient Active Problem List   Diagnosis Date Noted  . Dysphagia 11/15/2016  . S/P AVR (aortic valve replacement) 04/13/2016  . Encounter for therapeutic drug monitoring 01/02/2016  . Hx of TIA (transient ischemic attack) and stroke 12/06/2015  . Type II diabetes mellitus ([place])   . Osteoarthritis   . Fibromyalgia   . Atrial fibrillation ([place])   . CHF (congestive heart failure) ([place])   . Esophageal spasm   . Numbness on right side 05/03/2015  . TIA (transient ischemic attack) 05/02/2015  . Hypertension 05/02/2015    1:51 PM, 01/14/17 Etta Grandchild, PT, DPT Physical Therapist at [facility] [phone] (office)     [facility] [address] [place], [place], [address] Phone: [phone]   Fax:  [phone]  Name: Jeanette Yates MRN: [id number] Date of Birth: 1940-05-23

## 2017-01-14 NOTE — Progress Notes (Signed)
If recurrent or persistent diarrhea she needs to let us know. Please inform patient.

## 2017-01-15 ENCOUNTER — Encounter: Payer: Self-pay | Admitting: Internal Medicine

## 2017-01-15 ENCOUNTER — Encounter: Payer: Self-pay | Admitting: Gastroenterology

## 2017-01-15 DIAGNOSIS — H43813 Vitreous degeneration, bilateral: Secondary | ICD-10-CM | POA: Diagnosis not present

## 2017-01-15 DIAGNOSIS — H353231 Exudative age-related macular degeneration, bilateral, with active choroidal neovascularization: Secondary | ICD-10-CM | POA: Diagnosis not present

## 2017-01-15 DIAGNOSIS — H53413 Scotoma involving central area, bilateral: Secondary | ICD-10-CM | POA: Diagnosis not present

## 2017-01-15 DIAGNOSIS — H2513 Age-related nuclear cataract, bilateral: Secondary | ICD-10-CM | POA: Diagnosis not present

## 2017-01-16 ENCOUNTER — Telehealth: Payer: Self-pay | Admitting: Gastroenterology

## 2017-01-16 ENCOUNTER — Other Ambulatory Visit: Payer: Self-pay | Admitting: Gastroenterology

## 2017-01-16 DIAGNOSIS — Z23 Encounter for immunization: Secondary | ICD-10-CM | POA: Diagnosis not present

## 2017-01-16 MED ORDER — AZITHROMYCIN 500 MG PO TABS: 500.0000 mg | ORAL_TABLET | Freq: Every day | ORAL | 0 refills | Status: DC

## 2017-01-16 NOTE — Telephone Encounter (Signed)
Spoke with pt's daughter [name].  Pt has not received Zithromax from mail order pharmacy yet.  Corliss Skains to have pt take antibiotic 2 days and skip coumadin on the third day and repeat till finished with Abx.  She will then resume regular coumadin schedule.  INR appt moved up to 01/29/17.

## 2017-01-16 NOTE — Telephone Encounter (Signed)
Lattie Haw, I have prescribed zithromax 500mg  daily for 7 days for this patient for diarrhea illness. I informed her daughter to make sure you are aware of the change as coumadin may have to be adjusted. Thanks for your help!

## 2017-01-17 ENCOUNTER — Telehealth (HOSPITAL_COMMUNITY): Payer: Self-pay | Admitting: Physical Therapy

## 2017-01-17 ENCOUNTER — Ambulatory Visit (HOSPITAL_COMMUNITY): Payer: Medicare Other | Admitting: Physical Therapy

## 2017-01-17 NOTE — Telephone Encounter (Signed)
Antibiotic should be arriving Tonight or tomorrow 01/18/17 per pts daughter [name]

## 2017-01-17 NOTE — Telephone Encounter (Signed)
She came in thinking her appointment was @ 4pm when it  was @ 3:15pm

## 2017-01-17 NOTE — Telephone Encounter (Signed)
No show (#1).  Pt showed 40 minutes late.  CG reports she got her appointments mixed up. Teena Irani, PTA/CLT [phone]

## 2017-01-17 NOTE — Telephone Encounter (Signed)
Ok. Thanks!

## 2017-01-17 NOTE — Telephone Encounter (Signed)
Can we find out how fast patient will get zithromax from mail order pharmacy? I should have sent it to a local one but sent it to the pharmacy listed in [facility].

## 2017-01-20 ENCOUNTER — Encounter: Payer: Self-pay | Admitting: Gastroenterology

## 2017-01-21 ENCOUNTER — Ambulatory Visit (HOSPITAL_COMMUNITY): Payer: Medicare Other | Admitting: Physical Therapy

## 2017-01-21 ENCOUNTER — Other Ambulatory Visit: Payer: Self-pay | Admitting: Gastroenterology

## 2017-01-21 DIAGNOSIS — M6281 Muscle weakness (generalized): Secondary | ICD-10-CM

## 2017-01-21 DIAGNOSIS — M79605 Pain in left leg: Secondary | ICD-10-CM

## 2017-01-21 DIAGNOSIS — R2681 Unsteadiness on feet: Secondary | ICD-10-CM | POA: Diagnosis not present

## 2017-01-21 DIAGNOSIS — Z9181 History of falling: Secondary | ICD-10-CM

## 2017-01-21 DIAGNOSIS — R29898 Other symptoms and signs involving the musculoskeletal system: Secondary | ICD-10-CM | POA: Diagnosis not present

## 2017-01-21 MED ORDER — AZITHROMYCIN 500 MG PO TABS: 500.0000 mg | ORAL_TABLET | Freq: Every day | ORAL | 0 refills | Status: DC

## 2017-01-21 NOTE — Therapy (Signed)
[place], [place], [address] Phone: [phone]   Fax:  [phone]  Physical Therapy Treatment  Patient Details  Name: Jeanette Yates MRN: [id number] Date of Birth: [birth year] Referring Provider: Hollace Kinnier   Encounter Date: 01/21/2017      PT End of Session - 01/21/17 1707    Visit Number 3   Number of Visits 9   Date for PT Re-Evaluation 02/04/17   Authorization Type Medicare and AARP    Authorization Time Period 01/07/17 to 02/07/17   Authorization - Visit Number 3   Authorization - Number of Visits 10   PT Start Time 2025   PT Stop Time 1648   PT Time Calculation (min) 43 min   Activity Tolerance Patient tolerated treatment well   Behavior During Therapy [facility] for tasks assessed/performed      Past Medical History:  Diagnosis Date  . Adjustment disorder 10/17/2005  . Atrial fibrillation ([place])   . Cataract    bilateral  . CHF (congestive heart failure) ([place])   . Contracture of knee joint 07/26/2009  . Coronary artery disease   . Degeneration of lumbar or lumbosacral intervertebral disc 07/26/2009  . Diabetes mellitus without complication ([place])   . Esophageal spasm   . Esophageal spasm   . Fibromyalgia   . Fibromyalgia   . Hearing loss 12/18/2010  . Heart murmur   . Hypercholesteremia   . Hypertension   . 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 Leaky heart valve   . Lyme disease   . Macular degeneration   . Macular degeneration of both eyes   . Migraine 10/17/2005  . Mixed incontinence 10/17/2005  . Osteoarthritis    multiple joints   . Overweight 09/19/2010  . Pneumonia   . PONV (postoperative nausea and vomiting)   . Postartificial menopausal syndrome 10/10/2009  . Prinzmetal angina ([place]) 10/17/2005  . Psoriasis 06/12/2006  . Sciatica   . TIA (transient ischemic attack)   . Type II diabetes mellitus ([place])   . [name] 10/17/2005    Past Surgical  History:  Procedure Laterality Date  . ABDOMINAL HYSTERECTOMY    . AORTIC VALVE REPLACEMENT  12/10/2013   porcine  . CHOLECYSTECTOMY  1981  . COLONOSCOPY  04/18/2006   Dr. Nelva Nay: internal hemorrhoids  . COLONOSCOPY  10/2003   Dr. Alphonsa Gin: hemorrhoids  . CORONARY ARTERY BYPASS GRAFT  2015  . ESOPHAGOGASTRODUODENOSCOPY  10/16/2013   Dr. Marla Roe: prior Nissen fundoplication intact, hypertonic LES, dilated up to 76 [place] with moderate resistance, gastritis but no H. pylori., Reactive gastritis, no celiac disease.  . ESOPHAGOGASTRODUODENOSCOPY  05/29/2012   Dr. Doy Mince: Moderately severe esophagitis, acute gastritis reactive, no H pylori, no celiac. Esophageal biopsies consistent with GERD, no Barrett  . ESOPHAGOGASTRODUODENOSCOPY  03/21/2009   Dr. Doy Mince: Reflux esophagitis, gastritis without H. pylori, esophagus stretched 57 savory  . ESOPHAGOGASTRODUODENOSCOPY  02/20/2008   Dr. Doy Mince: Esophagus dilated to 50 French, reactive gastropathy with no H pylori. No Barrett's on esophageal biopsy  . ESOPHAGOGASTRODUODENOSCOPY  09/24/2006   Dr. Doy Mince: Tight wrap noted, reactive gastropathy, no Barrett's  . ESOPHAGOGASTRODUODENOSCOPY (EGD) WITH PROPOFOL N/A 01/09/2017   Procedure: ESOPHAGOGASTRODUODENOSCOPY (EGD) WITH PROPOFOL;  Surgeon: Daneil Dolin, MD;  [facility];  Service: Endoscopy;  Laterality: N/A;  2:15PM  . FRACTURE SURGERY Left    wrist  . JOINT REPLACEMENT    . MALONEY DILATION N/A 01/09/2017   Procedure: Venia Minks  DILATION;  Surgeon: Daneil Dolin, MD;  Location[facility];  Service: Endoscopy;  Laterality: N/A;  . MITRAL VALVE REPAIR  11/2013  . NISSEN FUNDOPLICATION  1610  . REPLACEMENT TOTAL KNEE BILATERAL  2010/2013    There were no vitals filed for this visit.      Subjective Assessment - 01/21/17 1624    Subjective PT states her Lt foot and back are hurting some today at 4/10.  Reports no other issues.  STates she feels most her  issues are due to her low vision.   Currently in Pain? Yes   Pain Score 4    Pain Location Back   Pain Orientation Left   Pain Descriptors / Indicators Aching                         [facility] Adult PT Treatment/Exercise - 01/21/17 0001      Knee/Hip Exercises: Standing   Heel Raises 15 reps   Hip Flexion Both;10 reps   Hip Flexion Limitations high march with 3" holds   Forward Lunges Both;10 reps   Forward Lunges Limitations 4" no UE's   Hip Abduction Both;10 reps   Hip Extension Both;10 reps   SLS completed 30" solid surface, 15"Lt and 30" Rt on foam   Other Standing Knee Exercises side stepping BTB under feet 1RT     Knee/Hip Exercises: Seated   Sit to Sand 2 sets;10 reps     Knee/Hip Exercises: Supine   Bridges 15 reps   Straight Leg Raises Both;10 reps             Balance Exercises - 01/21/17 1629      Balance Exercises: Standing   Standing Eyes Opened Foam/compliant surface;30 secs   Tandem Stance Eyes open;Foam/compliant surface;30 secs             PT Short Term Goals - 01/14/17 1309      PT SHORT TERM GOAL #1   Title Pt will demo consistency and independence with HEP   Time 1   Period Weeks   Status On-going     PT SHORT TERM GOAL #2   Title Patient to be participatory in regular progressive walking program in order to improve functional activity tolerance    Time 2   Period Weeks   Status On-going     PT SHORT TERM GOAL #3   Title Patient and family to be able to verbalize strategies to assist in compensating for poor vision in order to reduce fall risk related to impaired perception/vision.    Time 2   Period Weeks   Status On-going           PT Long Term Goals - 01/14/17 1311      PT LONG TERM GOAL #1   Title Patient to demonstrate MMT as being 5/5 in all tested groups in order to improve balance and reduce fall risk    Time 4   Period Weeks   Status On-going     PT LONG TERM GOAL #2   Title Patient to be able  to score at least 22/24 points on DGI in order to show improved dynamic balance/reduced fall risk    Time 4   Period Weeks   Status On-going     PT LONG TERM GOAL #3   Title Patient to be able to ambulate at least 678ft during 3MWT with no unsteadiness or fatigue in order to show improved stable  community access    Time 4   Period Weeks   Status On-going               Plan - 01/21/17 1707    Clinical Impression Statement Reviewed HEP with cues for completing slowly, controlled and in correct form.  Began static balance activities with ability to maintain SLS for 30" on stable surface.  When added foam surface, able to maintain on Rt but unable to stay greater than 15" on the Lt.  Caregiver is present with her today that assures she completes her HEP daily.  Pt instructed to place band under her feet as she as complaints of discomfort wrapping around her LE's for side stepping.  Additional exercises added for strengthenign and balance this session all with good form.   Rehab Potential Good   Clinical Impairments Affecting Rehab Potential (+) success with PT in the past, supportive family, motivated; (-) poor vision with progressive deterioration (macular degeneration), history of falls    PT Frequency 2x / week   PT Duration 4 weeks   PT Treatment/Interventions ADLs/Self Care Home Management;DME Instruction;Gait training;Stair training;Functional mobility training;Therapeutic activities;Therapeutic exercise;Neuromuscular re-education;Balance training;Patient/family education;Manual techniques;Energy conservation;Taping   PT Next Visit Plan Continue to progress difficulty of exercises, challenge of balance.    PT Home Exercise Plan See attachment    Consulted and Agree with Plan of Care Patient;Family member/caregiver   Family Member Consulted daughter      Patient will benefit from skilled therapeutic intervention in order to improve the following deficits and impairments:  Abnormal  gait, Decreased coordination, Decreased mobility, Decreased activity tolerance, Decreased strength, Decreased balance, Decreased safety awareness, Difficulty walking  Visit Diagnosis: Unsteadiness on feet  Muscle weakness (generalized)  History of falling  Pain in left leg  Other symptoms and signs involving the musculoskeletal system     Problem List Patient Active Problem List   Diagnosis Date Noted  . Dysphagia 11/15/2016  . S/P AVR (aortic valve replacement) 04/13/2016  . Encounter for therapeutic drug monitoring 01/02/2016  . Hx of TIA (transient ischemic attack) and stroke 12/06/2015  . Type II diabetes mellitus ([place])   . Osteoarthritis   . Fibromyalgia   . Atrial fibrillation ([place])   . CHF (congestive heart failure) ([place])   . Esophageal spasm   . Numbness on right side 05/03/2015  . TIA (transient ischemic attack) 05/02/2015  . Hypertension 05/02/2015    Jeanette Yates 01/21/2017, 5:15 PM  [place] [address] [place], [place], [address] Phone: [phone]   Fax:  [phone]  Name: AMERA BANOS MRN: [id number] Date of Birth: 1941/03/17

## 2017-01-24 ENCOUNTER — Ambulatory Visit (HOSPITAL_COMMUNITY): Payer: Medicare Other | Attending: Internal Medicine | Admitting: Physical Therapy

## 2017-01-24 ENCOUNTER — Encounter (HOSPITAL_COMMUNITY): Payer: Self-pay | Admitting: Physical Therapy

## 2017-01-24 DIAGNOSIS — Z9181 History of falling: Secondary | ICD-10-CM

## 2017-01-24 DIAGNOSIS — M6281 Muscle weakness (generalized): Secondary | ICD-10-CM

## 2017-01-24 DIAGNOSIS — M79605 Pain in left leg: Secondary | ICD-10-CM | POA: Diagnosis not present

## 2017-01-24 DIAGNOSIS — R29898 Other symptoms and signs involving the musculoskeletal system: Secondary | ICD-10-CM | POA: Insufficient documentation

## 2017-01-24 DIAGNOSIS — R2681 Unsteadiness on feet: Secondary | ICD-10-CM

## 2017-01-24 NOTE — Therapy (Signed)
[place], [place], [address] Phone: [phone]   Fax:  [phone]  Physical Therapy Treatment  Patient Details  Name: Jeanette Yates MRN: [id number] Date of Birth: [birth year] Referring Provider: Hollace Kinnier   Encounter Date: 01/24/2017      PT End of Session - 01/24/17 1708    Visit Number 4   Number of Visits 9   Date for PT Re-Evaluation 02/04/17   Authorization Type Medicare and AARP    Authorization Time Period 01/07/17 to 02/07/17   Authorization - Visit Number 4   Authorization - Number of Visits 10   PT Start Time 1602   PT Stop Time 1645   PT Time Calculation (min) 43 min   Activity Tolerance Patient tolerated treatment well   Behavior During Therapy [facility] for tasks assessed/performed      Past Medical History:  Diagnosis Date  . Adjustment disorder 10/17/2005  . Atrial fibrillation ([place])   . Cataract    bilateral  . CHF (congestive heart failure) ([place])   . Contracture of knee joint 07/26/2009  . Coronary artery disease   . Degeneration of lumbar or lumbosacral intervertebral disc 07/26/2009  . Diabetes mellitus without complication ([place])   . Esophageal spasm   . Esophageal spasm   . Fibromyalgia   . Fibromyalgia   . Hearing loss 12/18/2010  . Heart murmur   . Hypercholesteremia   . Hypertension   . 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 Leaky heart valve   . Lyme disease   . Macular degeneration   . Macular degeneration of both eyes   . Migraine 10/17/2005  . Mixed incontinence 10/17/2005  . Osteoarthritis    multiple joints   . Overweight 09/19/2010  . Pneumonia   . PONV (postoperative nausea and vomiting)   . Postartificial menopausal syndrome 10/10/2009  . Prinzmetal angina ([place]) 10/17/2005  . Psoriasis 06/12/2006  . Sciatica   . TIA (transient ischemic attack)   . Type II diabetes mellitus ([place])   . [name] 10/17/2005    Past Surgical History:   Procedure Laterality Date  . ABDOMINAL HYSTERECTOMY    . AORTIC VALVE REPLACEMENT  12/10/2013   porcine  . CHOLECYSTECTOMY  1981  . COLONOSCOPY  04/18/2006   Dr. Nelva Nay: internal hemorrhoids  . COLONOSCOPY  10/2003   Dr. Alphonsa Gin: hemorrhoids  . CORONARY ARTERY BYPASS GRAFT  2015  . ESOPHAGOGASTRODUODENOSCOPY  10/16/2013   Dr. Marla Roe: prior Nissen fundoplication intact, hypertonic LES, dilated up to 5 [place] with moderate resistance, gastritis but no H. pylori., Reactive gastritis, no celiac disease.  . ESOPHAGOGASTRODUODENOSCOPY  05/29/2012   Dr. Doy Mince: Moderately severe esophagitis, acute gastritis reactive, no H pylori, no celiac. Esophageal biopsies consistent with GERD, no Barrett  . ESOPHAGOGASTRODUODENOSCOPY  03/21/2009   Dr. Doy Mince: Reflux esophagitis, gastritis without H. pylori, esophagus stretched 57 savory  . ESOPHAGOGASTRODUODENOSCOPY  02/20/2008   Dr. Doy Mince: Esophagus dilated to 29 French, reactive gastropathy with no H pylori. No Barrett's on esophageal biopsy  . ESOPHAGOGASTRODUODENOSCOPY  09/24/2006   Dr. Doy Mince: Tight wrap noted, reactive gastropathy, no Barrett's  . ESOPHAGOGASTRODUODENOSCOPY (EGD) WITH PROPOFOL N/A 01/09/2017   Procedure: ESOPHAGOGASTRODUODENOSCOPY (EGD) WITH PROPOFOL;  Surgeon: Daneil Dolin, MD;  [facility];  Service: Endoscopy;  Laterality: N/A;  2:15PM  . FRACTURE SURGERY Left    wrist  . JOINT REPLACEMENT    . MALONEY DILATION N/A 01/09/2017   Procedure: Venia Minks  DILATION;  Surgeon: Daneil Dolin, MD;  [facility];  Service: Endoscopy;  Laterality: N/A;  . MITRAL VALVE REPAIR  11/2013  . NISSEN FUNDOPLICATION  7253  . REPLACEMENT TOTAL KNEE BILATERAL  2010/2013    There were no vitals filed for this visit.      Subjective Assessment - 01/24/17 1604    Subjective patient arrives today stating she is doing well, she was sore after last session. She has not tried using glow tape as markers  yet.    Patient Stated Goals improve gait over stairs and outside, improve strength including core, improve flexibility; review of back exercises given last bout of PT    Currently in Pain? No/denies                         [facility] Adult PT Treatment/Exercise - 01/24/17 0001      Knee/Hip Exercises: Standing   Heel Raises Both;1 set;15 reps   Heel Raises Limitations heel and toe    Forward Lunges Both;1 set;15 reps   Forward Lunges Limitations 4 inch box    Forward Step Up Both;1 set;15 reps   Forward Step Up Limitations 4 inch box U HHA    Other Standing Knee Exercises hip hikes 2x10 B with and without swing    Other Standing Knee Exercises 3 way hip holds with 3 second puases 1x10 B              Balance Exercises - 01/24/17 1620      Balance Exercises: Standing   Tandem Stance Eyes open;Foam/compliant surface;3 reps;15 secs   Tandem Gait Forward;4 reps   Retro Gait 3 reps   Step Over Hurdles / Cones small hurdles forwards and lateral stepping in // bars    Other Standing Exercises dual tasking: forward and retrogait while pouring water            PT Education - 01/24/17 1707    Education provided Yes   Education Details compensation patterns, muscle imbalances and possible relation to knee pain/balance deficit    Person(s) Educated Patient;Child(ren)   Methods Explanation   Comprehension Verbalized understanding          PT Short Term Goals - 01/14/17 1309      PT SHORT TERM GOAL #1   Title Pt will demo consistency and independence with HEP   Time 1   Period Weeks   Status On-going     PT SHORT TERM GOAL #2   Title Patient to be participatory in regular progressive walking program in order to improve functional activity tolerance    Time 2   Period Weeks   Status On-going     PT SHORT TERM GOAL #3   Title Patient and family to be able to verbalize strategies to assist in compensating for poor vision in order to reduce fall risk related  to impaired perception/vision.    Time 2   Period Weeks   Status On-going           PT Long Term Goals - 01/14/17 1311      PT LONG TERM GOAL #1   Title Patient to demonstrate MMT as being 5/5 in all tested groups in order to improve balance and reduce fall risk    Time 4   Period Weeks   Status On-going     PT LONG TERM GOAL #2   Title Patient to be able to score at least 22/24  points on DGI in order to show improved dynamic balance/reduced fall risk    Time 4   Period Weeks   Status On-going     PT LONG TERM GOAL #3   Title Patient to be able to ambulate at least 623ft during 3MWT with no unsteadiness or fatigue in order to show improved stable community access    Time 4   Period Weeks   Status On-going               Plan - 01/24/17 1708    Clinical Impression Statement Patient arrives today reporting she is doing well, she has not tried using tape markers or glow tape yet. Continued working on functional strength and balance this session, with progressions introduced as appropriate. Note some exercises were limited and/or adjusted due to knee pain. Patient doing well so far with skilled PT services.    Rehab Potential Good   Clinical Impairments Affecting Rehab Potential (+) success with PT in the past, supportive family, motivated; (-) poor vision with progressive deterioration (macular degeneration), history of falls    PT Frequency 2x / week   PT Duration 4 weeks   PT Treatment/Interventions ADLs/Self Care Home Management;DME Instruction;Gait training;Stair training;Functional mobility training;Therapeutic activities;Therapeutic exercise;Neuromuscular re-education;Balance training;Patient/family education;Manual techniques;Energy conservation;Taping   PT Next Visit Plan Continue to progress difficulty of exercises, challenge of balance.    PT Home Exercise Plan See attachment    Consulted and Agree with Plan of Care Patient;Family member/caregiver   Family  Member Consulted daughter      Patient will benefit from skilled therapeutic intervention in order to improve the following deficits and impairments:  Abnormal gait, Decreased coordination, Decreased mobility, Decreased activity tolerance, Decreased strength, Decreased balance, Decreased safety awareness, Difficulty walking  Visit Diagnosis: Unsteadiness on feet  Muscle weakness (generalized)  History of falling     Problem List Patient Active Problem List   Diagnosis Date Noted  . Dysphagia 11/15/2016  . S/P AVR (aortic valve replacement) 04/13/2016  . Encounter for therapeutic drug monitoring 01/02/2016  . Hx of TIA (transient ischemic attack) and stroke 12/06/2015  . Type II diabetes mellitus ([place])   . Osteoarthritis   . Fibromyalgia   . Atrial fibrillation ([place])   . CHF (congestive heart failure) ([place])   . Esophageal spasm   . Numbness on right side 05/03/2015  . TIA (transient ischemic attack) 05/02/2015  . Hypertension 05/02/2015    Deniece Ree PT, DPT [place] [address] [place], [place], [address] Phone: [phone]   Fax:  [phone]  Name: Jeanette Yates MRN: [id number] Date of Birth: 1941-03-15

## 2017-01-25 DIAGNOSIS — H02835 Dermatochalasis of left lower eyelid: Secondary | ICD-10-CM | POA: Diagnosis not present

## 2017-01-25 DIAGNOSIS — H353232 Exudative age-related macular degeneration, bilateral, with inactive choroidal neovascularization: Secondary | ICD-10-CM | POA: Diagnosis not present

## 2017-01-25 DIAGNOSIS — H2513 Age-related nuclear cataract, bilateral: Secondary | ICD-10-CM | POA: Diagnosis not present

## 2017-01-25 DIAGNOSIS — H02831 Dermatochalasis of right upper eyelid: Secondary | ICD-10-CM | POA: Diagnosis not present

## 2017-01-25 DIAGNOSIS — H43813 Vitreous degeneration, bilateral: Secondary | ICD-10-CM | POA: Diagnosis not present

## 2017-01-25 DIAGNOSIS — H02832 Dermatochalasis of right lower eyelid: Secondary | ICD-10-CM | POA: Diagnosis not present

## 2017-01-25 DIAGNOSIS — E119 Type 2 diabetes mellitus without complications: Secondary | ICD-10-CM | POA: Diagnosis not present

## 2017-01-25 DIAGNOSIS — H02834 Dermatochalasis of left upper eyelid: Secondary | ICD-10-CM | POA: Diagnosis not present

## 2017-01-28 ENCOUNTER — Ambulatory Visit (HOSPITAL_COMMUNITY): Payer: Medicare Other | Admitting: Physical Therapy

## 2017-01-28 DIAGNOSIS — Z9181 History of falling: Secondary | ICD-10-CM | POA: Diagnosis not present

## 2017-01-28 DIAGNOSIS — M79605 Pain in left leg: Secondary | ICD-10-CM | POA: Diagnosis not present

## 2017-01-28 DIAGNOSIS — M6281 Muscle weakness (generalized): Secondary | ICD-10-CM

## 2017-01-28 DIAGNOSIS — R2681 Unsteadiness on feet: Secondary | ICD-10-CM | POA: Diagnosis not present

## 2017-01-28 DIAGNOSIS — R29898 Other symptoms and signs involving the musculoskeletal system: Secondary | ICD-10-CM | POA: Diagnosis not present

## 2017-01-28 NOTE — Therapy (Signed)
[place], [place], [address] Phone: [phone]   Fax:  [phone]  Physical Therapy Treatment  Patient Details  Name: Jeanette Yates MRN: [id number] Date of Birth: 08/27/1940 Referring Provider: Hollace Kinnier    Encounter Date: 01/28/2017  PT End of Session - 01/28/17 1648    Visit Number  5    Number of Visits  9    Date for PT Re-Evaluation  02/04/17    Authorization Type  Medicare and AARP     Authorization Time Period  01/07/17 to 02/07/17    Authorization - Visit Number  5    Authorization - Number of Visits  10    PT Start Time  6384    PT Stop Time  1648    PT Time Calculation (min)  39 min    Activity Tolerance  Patient tolerated treatment well    Behavior During Therapy  [facility] for tasks assessed/performed       Past Medical History:  Diagnosis Date  . Adjustment disorder 10/17/2005  . Atrial fibrillation ([place])   . Cataract    bilateral  . CHF (congestive heart failure) ([place])   . Contracture of knee joint 07/26/2009  . Coronary artery disease   . Degeneration of lumbar or lumbosacral intervertebral disc 07/26/2009  . Diabetes mellitus without complication ([place])   . Esophageal spasm   . Esophageal spasm   . Fibromyalgia   . Fibromyalgia   . Hearing loss 12/18/2010  . Heart murmur   . Hypercholesteremia   . Hypertension   . 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 Leaky heart valve   . Lyme disease   . Macular degeneration   . Macular degeneration of both eyes   . Migraine 10/17/2005  . Mixed incontinence 10/17/2005  . Osteoarthritis    multiple joints   . Overweight 09/19/2010  . Pneumonia   . PONV (postoperative nausea and vomiting)   . Postartificial menopausal syndrome 10/10/2009  . Prinzmetal angina ([place]) 10/17/2005  . Psoriasis 06/12/2006  . Sciatica   . TIA (transient ischemic attack)   . Type II diabetes mellitus ([place])   . [name] 10/17/2005     Past Surgical History:  Procedure Laterality Date  . ABDOMINAL HYSTERECTOMY    . AORTIC VALVE REPLACEMENT  12/10/2013   porcine  . CHOLECYSTECTOMY  1981  . COLONOSCOPY  04/18/2006   Dr. Nelva Nay: internal hemorrhoids  . COLONOSCOPY  10/2003   Dr. Alphonsa Gin: hemorrhoids  . CORONARY ARTERY BYPASS GRAFT  2015  . ESOPHAGOGASTRODUODENOSCOPY  10/16/2013   Dr. Marla Roe: prior Nissen fundoplication intact, hypertonic LES, dilated up to 31 [place] with moderate resistance, gastritis but no H. pylori., Reactive gastritis, no celiac disease.  . ESOPHAGOGASTRODUODENOSCOPY  05/29/2012   Dr. Doy Mince: Moderately severe esophagitis, acute gastritis reactive, no H pylori, no celiac. Esophageal biopsies consistent with GERD, no Barrett  . ESOPHAGOGASTRODUODENOSCOPY  03/21/2009   Dr. Doy Mince: Reflux esophagitis, gastritis without H. pylori, esophagus stretched 57 savory  . ESOPHAGOGASTRODUODENOSCOPY  02/20/2008   Dr. Doy Mince: Esophagus dilated to 73 French, reactive gastropathy with no H pylori. No Barrett's on esophageal biopsy  . ESOPHAGOGASTRODUODENOSCOPY  09/24/2006   Dr. Doy Mince: Tight wrap noted, reactive gastropathy, no Barrett's  . FRACTURE SURGERY Left    wrist  . JOINT REPLACEMENT    . MITRAL VALVE REPAIR  11/2013  . NISSEN FUNDOPLICATION  6659  . REPLACEMENT TOTAL KNEE BILATERAL  2010/2013  There were no vitals filed for this visit.  Subjective Assessment - 01/28/17 1650    Subjective  Pt late today.  Reports compliance with HEP.                      [facility] Adult PT Treatment/Exercise - 01/28/17 0001      Knee/Hip Exercises: Standing   Heel Raises  Both;1 set;15 reps    Heel Raises Limitations  heel and toe     Forward Lunges  Both;1 set;15 reps    Forward Lunges Limitations  no step     Hip Abduction  Both;15 reps    Hip Extension  Both;15 reps    SLS  completed 30" solid surface, 15"Lt and 30" Rt on foam    Other Standing Knee Exercises  hip hikes  2x10 B with and without swing           Balance Exercises - 01/28/17 1648      Balance Exercises: Standing   Tandem Gait  Forward;Retro;2 reps    Sidestepping  2 reps        PT Education - 01/28/17 1650    Education provided  Yes    Education Details  given additional exercise instructions for HEP    Person(s) Educated  Patient;Caregiver(s)    Methods  Handout;Demonstration;Tactile cues;Verbal cues    Comprehension  Returned demonstration;Verbalized understanding       PT Short Term Goals - 01/14/17 1309      PT SHORT TERM GOAL #1   Title  Pt will demo consistency and independence with HEP    Time  1    Period  Weeks    Status  On-going      PT SHORT TERM GOAL #2   Title  Patient to be participatory in regular progressive walking program in order to improve functional activity tolerance     Time  2    Period  Weeks    Status  On-going      PT SHORT TERM GOAL #3   Title  Patient and family to be able to verbalize strategies to assist in compensating for poor vision in order to reduce fall risk related to impaired perception/vision.     Time  2    Period  Weeks    Status  On-going        PT Long Term Goals - 01/14/17 1311      PT LONG TERM GOAL #1   Title  Patient to demonstrate MMT as being 5/5 in all tested groups in order to improve balance and reduce fall risk     Time  4    Period  Weeks    Status  On-going      PT LONG TERM GOAL #2   Title  Patient to be able to score at least 22/24 points on DGI in order to show improved dynamic balance/reduced fall risk     Time  4    Period  Weeks    Status  On-going      PT LONG TERM GOAL #3   Title  Patient to be able to ambulate at least 646ft during 3MWT with no unsteadiness or fatigue in order to show improved stable community access     Time  4    Period  Weeks    Status  On-going            Plan - 01/28/17 1649    Clinical Impression Statement  Pt arrived late.  Contiued with focus on balance  and stability exercises.  progressed to balance beam with dynamic tasks.  CG asked to print copies of all exercises as she is wiling to do some better than others at times.  Copy given to pateint and CG. No LOB this session with ability to self correct.     Rehab Potential  Good    Clinical Impairments Affecting Rehab Potential  (+) success with PT in the past, supportive family, motivated; (-) poor vision with progressive deterioration (macular degeneration), history of falls     PT Frequency  2x / week    PT Duration  4 weeks    PT Treatment/Interventions  ADLs/Self Care Home Management;DME Instruction;Gait training;Stair training;Functional mobility training;Therapeutic activities;Therapeutic exercise;Neuromuscular re-education;Balance training;Patient/family education;Manual techniques;Energy conservation;Taping    PT Next Visit Plan  Continue to progress difficulty of exercises, challenge of balance.     PT Home Exercise Plan  See attachment     Consulted and Agree with Plan of Care  Patient;Family member/caregiver    Family Member Consulted  daughter       Patient will benefit from skilled therapeutic intervention in order to improve the following deficits and impairments:  Abnormal gait, Decreased coordination, Decreased mobility, Decreased activity tolerance, Decreased strength, Decreased balance, Decreased safety awareness, Difficulty walking  Visit Diagnosis: Unsteadiness on feet  Muscle weakness (generalized)  History of falling  Pain in left leg  Other symptoms and signs involving the musculoskeletal system     Problem List Patient Active Problem List   Diagnosis Date Noted  . Dysphagia 11/15/2016  . S/P AVR (aortic valve replacement) 04/13/2016  . Encounter for therapeutic drug monitoring 01/02/2016  . Hx of TIA (transient ischemic attack) and stroke 12/06/2015  . Type II diabetes mellitus ([place])   . Osteoarthritis   . Fibromyalgia   . Atrial fibrillation ([place])   .  CHF (congestive heart failure) ([place])   . Esophageal spasm   . Numbness on right side 05/03/2015  . TIA (transient ischemic attack) 05/02/2015  . Hypertension 05/02/2015   Teena Irani, PTA/CLT [phone]  Teena Irani 01/28/2017, 4:52 PM  [facility] [address] [place], [place], [address] Phone: [phone]   Fax:  [phone]  Name: Jeanette Yates MRN: [id number] Date of Birth: [birth year]

## 2017-01-31 ENCOUNTER — Ambulatory Visit (HOSPITAL_COMMUNITY): Payer: Medicare Other

## 2017-01-31 ENCOUNTER — Ambulatory Visit (INDEPENDENT_AMBULATORY_CARE_PROVIDER_SITE_OTHER): Payer: Medicare Other | Admitting: *Deleted

## 2017-01-31 ENCOUNTER — Encounter (HOSPITAL_COMMUNITY): Payer: Self-pay

## 2017-01-31 DIAGNOSIS — I4891 Unspecified atrial fibrillation: Secondary | ICD-10-CM | POA: Diagnosis not present

## 2017-01-31 DIAGNOSIS — Z9181 History of falling: Secondary | ICD-10-CM | POA: Diagnosis not present

## 2017-01-31 DIAGNOSIS — M79605 Pain in left leg: Secondary | ICD-10-CM

## 2017-01-31 DIAGNOSIS — Z5181 Encounter for therapeutic drug level monitoring: Secondary | ICD-10-CM

## 2017-01-31 DIAGNOSIS — R29898 Other symptoms and signs involving the musculoskeletal system: Secondary | ICD-10-CM | POA: Diagnosis not present

## 2017-01-31 DIAGNOSIS — Z8673 Personal history of transient ischemic attack (TIA), and cerebral infarction without residual deficits: Secondary | ICD-10-CM | POA: Diagnosis not present

## 2017-01-31 DIAGNOSIS — R2681 Unsteadiness on feet: Secondary | ICD-10-CM

## 2017-01-31 DIAGNOSIS — M6281 Muscle weakness (generalized): Secondary | ICD-10-CM

## 2017-01-31 LAB — POCT INR: INR: 3

## 2017-01-31 NOTE — Therapy (Signed)
[place], [place], [address] Phone: [phone]   Fax:  [phone]  Physical Therapy Treatment  Patient Details  Name: Jeanette Yates MRN: [id number] Date of Birth: 06-Jul-1940 Referring Provider: Hollace Kinnier    Encounter Date: 01/31/2017  PT End of Session - 01/31/17 1646    Visit Number  6    Number of Visits  9    Date for PT Re-Evaluation  02/04/17    Authorization Type  Medicare and AARP     Authorization Time Period  01/07/17 to 02/07/17    Authorization - Visit Number  6    Authorization - Number of Visits  10    PT Start Time  1600    PT Stop Time  6712    PT Time Calculation (min)  45 min    Activity Tolerance  Patient tolerated treatment well    Behavior During Therapy  [facility] for tasks assessed/performed       Past Medical History:  Diagnosis Date  . Adjustment disorder 10/17/2005  . Atrial fibrillation ([place])   . Cataract    bilateral  . CHF (congestive heart failure) ([place])   . Contracture of knee joint 07/26/2009  . Coronary artery disease   . Degeneration of lumbar or lumbosacral intervertebral disc 07/26/2009  . Diabetes mellitus without complication ([place])   . Esophageal spasm   . Esophageal spasm   . Fibromyalgia   . Fibromyalgia   . Hearing loss 12/18/2010  . Heart murmur   . Hypercholesteremia   . Hypertension   . 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 Leaky heart valve   . Lyme disease   . Macular degeneration   . Macular degeneration of both eyes   . Migraine 10/17/2005  . Mixed incontinence 10/17/2005  . Osteoarthritis    multiple joints   . Overweight 09/19/2010  . Pneumonia   . PONV (postoperative nausea and vomiting)   . Postartificial menopausal syndrome 10/10/2009  . Prinzmetal angina ([place]) 10/17/2005  . Psoriasis 06/12/2006  . Sciatica   . TIA (transient ischemic attack)   . Type II diabetes mellitus ([place])   . [name] 10/17/2005     Past Surgical History:  Procedure Laterality Date  . ABDOMINAL HYSTERECTOMY    . AORTIC VALVE REPLACEMENT  12/10/2013   porcine  . CHOLECYSTECTOMY  1981  . COLONOSCOPY  04/18/2006   Dr. Nelva Nay: internal hemorrhoids  . COLONOSCOPY  10/2003   Dr. Alphonsa Gin: hemorrhoids  . CORONARY ARTERY BYPASS GRAFT  2015  . ESOPHAGOGASTRODUODENOSCOPY  10/16/2013   Dr. Marla Roe: prior Nissen fundoplication intact, hypertonic LES, dilated up to 45 [place] with moderate resistance, gastritis but no H. pylori., Reactive gastritis, no celiac disease.  . ESOPHAGOGASTRODUODENOSCOPY  05/29/2012   Dr. Doy Mince: Moderately severe esophagitis, acute gastritis reactive, no H pylori, no celiac. Esophageal biopsies consistent with GERD, no Barrett  . ESOPHAGOGASTRODUODENOSCOPY  03/21/2009   Dr. Doy Mince: Reflux esophagitis, gastritis without H. pylori, esophagus stretched 57 savory  . ESOPHAGOGASTRODUODENOSCOPY  02/20/2008   Dr. Doy Mince: Esophagus dilated to 60 French, reactive gastropathy with no H pylori. No Barrett's on esophageal biopsy  . ESOPHAGOGASTRODUODENOSCOPY  09/24/2006   Dr. Doy Mince: Tight wrap noted, reactive gastropathy, no Barrett's  . FRACTURE SURGERY Left    wrist  . JOINT REPLACEMENT    . MITRAL VALVE REPAIR  11/2013  . NISSEN FUNDOPLICATION  4580  . REPLACEMENT TOTAL KNEE BILATERAL  2010/2013  There were no vitals filed for this visit.  Subjective Assessment - 01/31/17 1603    Subjective  Pt notes that she is "not doing well today." She notes a headachea nd low back apin. She said her hips have been sore.     Pain Score  5     Pain Location  Back    Pain Orientation  Medial    Pain Descriptors / Indicators  Aching                      [facility] Adult PT Treatment/Exercise - 01/31/17 0001      Knee/Hip Exercises: Standing   Heel Raises  Both;20 reps    Heel Raises Limitations  heel and toe     SLS with Vectors  Tandem stance 30 sec x 2 foam beam; ball toss  10 each leg on foam     Rebounder  Forward ambulation 52 ft. x 1 each head turns, looking up/down    Walking with Sports Cord  Forward and Lateral stepping over 4" and 8" hurdles 2RT each    Gait Training  Side stepping, backward ambulate 2x2RT each;    Other Standing Knee Exercises  hip hike x 20    Other Standing Knee Exercises  3 way hip holds with 3 second puases 1x10 B       Knee/Hip Exercises: Seated   Sit to Sand  10 reps;2 sets no UE, 2000 gr ball       Knee/Hip Exercises: Supine   Terminal Knee Extension  10 reps    Theraband Level (Terminal Knee Extension)  Level 3 ([place])    Bridges  15 reps;2 sets    Straight Leg Raises  15 reps;2 sets;Both               PT Short Term Goals - 01/14/17 1309      PT SHORT TERM GOAL #1   Title  Pt will demo consistency and independence with HEP    Time  1    Period  Weeks    Status  On-going      PT SHORT TERM GOAL #2   Title  Patient to be participatory in regular progressive walking program in order to improve functional activity tolerance     Time  2    Period  Weeks    Status  On-going      PT SHORT TERM GOAL #3   Title  Patient and family to be able to verbalize strategies to assist in compensating for poor vision in order to reduce fall risk related to impaired perception/vision.     Time  2    Period  Weeks    Status  On-going        PT Long Term Goals - 01/14/17 1311      PT LONG TERM GOAL #1   Title  Patient to demonstrate MMT as being 5/5 in all tested groups in order to improve balance and reduce fall risk     Time  4    Period  Weeks    Status  On-going      PT LONG TERM GOAL #2   Title  Patient to be able to score at least 22/24 points on DGI in order to show improved dynamic balance/reduced fall risk     Time  4    Period  Weeks    Status  On-going      PT LONG TERM GOAL #3  Title  Patient to be able to ambulate at least 614ft during 3MWT with no unsteadiness or fatigue in order to show improved  stable community access     Time  4    Period  Weeks    Status  On-going            Plan - 01/31/17 1646    Clinical Impression Statement  Today's session was limited secondary to hip pain and soreness in low back. Daughter observed today's session. Patient tolerated functional balance exercises and strengthening in standing and supine well. Despite soreness she was able to tolerate increased repetitions today. Overall, she tolerated balance challenges well with most difficulty stepping forward overall 8" hurdles with LOB all trials. Resume hip strengthening exercises next session.    Rehab Potential  Good    Clinical Impairments Affecting Rehab Potential  (+) success with PT in the past, supportive family, motivated; (-) poor vision with progressive deterioration (macular degeneration), history of falls     PT Frequency  2x / week    PT Duration  4 weeks    PT Treatment/Interventions  ADLs/Self Care Home Management;DME Instruction;Gait training;Stair training;Functional mobility training;Therapeutic activities;Therapeutic exercise;Neuromuscular re-education;Balance training;Patient/family education;Manual techniques;Energy conservation;Taping    PT Next Visit Plan  Continue to progress difficulty of exercises, challenge of balance. Resume hip strengthening and progressions next session.     PT Home Exercise Plan  See attachment     Consulted and Agree with Plan of Care  Patient;Family member/caregiver    Family Member Consulted  daughter       Patient will benefit from skilled therapeutic intervention in order to improve the following deficits and impairments:  Abnormal gait, Decreased coordination, Decreased mobility, Decreased activity tolerance, Decreased strength, Decreased balance, Decreased safety awareness, Difficulty walking  Visit Diagnosis: Unsteadiness on feet  Muscle weakness (generalized)  History of falling  Pain in left leg  Other symptoms and signs involving the  musculoskeletal system     Problem List Patient Active Problem List   Diagnosis Date Noted  . Dysphagia 11/15/2016  . S/P AVR (aortic valve replacement) 04/13/2016  . Encounter for therapeutic drug monitoring 01/02/2016  . Hx of TIA (transient ischemic attack) and stroke 12/06/2015  . Type II diabetes mellitus ([place])   . Osteoarthritis   . Fibromyalgia   . Atrial fibrillation ([place])   . CHF (congestive heart failure) ([place])   . Esophageal spasm   . Numbness on right side 05/03/2015  . TIA (transient ischemic attack) 05/02/2015  . Hypertension 05/02/2015   Starr [place] PT, DPT 5:36 PM, 01/31/17 [place], [place], [address] Phone: [phone]   Fax:  [phone]  Name: Jeanette Yates MRN: [id number] Date of Birth: September 23, 1940

## 2017-02-05 ENCOUNTER — Ambulatory Visit (HOSPITAL_COMMUNITY): Payer: Medicare Other | Admitting: Physical Therapy

## 2017-02-07 ENCOUNTER — Ambulatory Visit (HOSPITAL_COMMUNITY): Payer: Medicare Other | Admitting: Physical Therapy

## 2017-02-07 ENCOUNTER — Other Ambulatory Visit: Payer: Medicare Other

## 2017-02-07 ENCOUNTER — Telehealth (HOSPITAL_COMMUNITY): Payer: Self-pay | Admitting: Internal Medicine

## 2017-02-07 DIAGNOSIS — I10 Essential (primary) hypertension: Secondary | ICD-10-CM | POA: Diagnosis not present

## 2017-02-07 DIAGNOSIS — E119 Type 2 diabetes mellitus without complications: Secondary | ICD-10-CM

## 2017-02-07 NOTE — Telephone Encounter (Signed)
02/07/17  daughter left a message to cx said that her mom woke up ill and is going to try and get her in with her dr and will call back to reschedule

## 2017-02-08 LAB — HEMOGLOBIN A1C
Hgb A1c MFr Bld: 7.1 % of total Hgb — ABNORMAL HIGH (ref <5.7)
Mean Plasma Glucose: 157 (calc)
eAG (mmol/L): 8.7 (calc)

## 2017-02-08 LAB — BASIC METABOLIC PANEL
BUN/Creatinine Ratio: 23 (calc) — ABNORMAL HIGH (ref 6–22)
BUN: 26 mg/dL — ABNORMAL HIGH (ref 7–25)
CO2: 32 mmol/L (ref 20–32)
Calcium: 9.8 mg/dL (ref 8.6–10.4)
Chloride: 101 mmol/L (ref 98–110)
Creat: 1.14 mg/dL — ABNORMAL HIGH (ref 0.60–0.93)
Glucose, Bld: 120 mg/dL — ABNORMAL HIGH (ref 65–99)
Potassium: 4.6 mmol/L (ref 3.5–5.3)
Sodium: 141 mmol/L (ref 135–146)

## 2017-02-11 ENCOUNTER — Telehealth (HOSPITAL_COMMUNITY): Payer: Self-pay | Admitting: Internal Medicine

## 2017-02-11 ENCOUNTER — Encounter: Payer: Self-pay | Admitting: Internal Medicine

## 2017-02-11 ENCOUNTER — Ambulatory Visit (HOSPITAL_COMMUNITY): Payer: Medicare Other

## 2017-02-11 ENCOUNTER — Ambulatory Visit (INDEPENDENT_AMBULATORY_CARE_PROVIDER_SITE_OTHER): Payer: Medicare Other | Admitting: Internal Medicine

## 2017-02-11 VITALS — BP 140/80 | HR 64 | Temp 98.2°F | Wt 162.0 lb

## 2017-02-11 DIAGNOSIS — I5032 Chronic diastolic (congestive) heart failure: Secondary | ICD-10-CM

## 2017-02-11 DIAGNOSIS — R2689 Other abnormalities of gait and mobility: Secondary | ICD-10-CM | POA: Diagnosis not present

## 2017-02-11 DIAGNOSIS — J069 Acute upper respiratory infection, unspecified: Secondary | ICD-10-CM | POA: Diagnosis not present

## 2017-02-11 DIAGNOSIS — I6521 Occlusion and stenosis of right carotid artery: Secondary | ICD-10-CM | POA: Diagnosis not present

## 2017-02-11 DIAGNOSIS — I480 Paroxysmal atrial fibrillation: Secondary | ICD-10-CM | POA: Diagnosis not present

## 2017-02-11 DIAGNOSIS — I10 Essential (primary) hypertension: Secondary | ICD-10-CM

## 2017-02-11 DIAGNOSIS — H2512 Age-related nuclear cataract, left eye: Secondary | ICD-10-CM | POA: Diagnosis not present

## 2017-02-11 DIAGNOSIS — E119 Type 2 diabetes mellitus without complications: Secondary | ICD-10-CM | POA: Diagnosis not present

## 2017-02-11 NOTE — Telephone Encounter (Signed)
02/11/17  daughter left a message to cx said her mom was still sick and just can't handle PT

## 2017-02-11 NOTE — Patient Instructions (Signed)
Decrease sodium intake to 1500-2000mg  per day.

## 2017-02-11 NOTE — Progress Notes (Signed)
Location:  [facility] clinic Provider:  Taleeya Blondin L. Mariea Clonts, D.O., C.M.D.  Code Status: DNR Goals of Care:  Advanced Directives 02/11/2017  Does Patient Have a Medical Advance Directive? Yes  Type of Paramedic of [place];Living will  Does patient want to make changes to medical advance directive? No - Patient declined  Copy of [place] in Chart? Yes  Would patient like information on creating a medical advance directive? -   Chief Complaint  Patient presents with  . Medical Management of Chronic Issues    42mth follow-up    HPI: Patient is a 76 y.o. female seen today for medical management of chronic diseases.    Has a head cold and a sore throat.  Started 3 days ago.  OJ/honey/olive oil concoction for sore throat, methalatum to keep her nose and chest open.  No whiskey was available for a hot toddy.  No sick contacts.  No fever.  Right ear hurts, but longer than all the rest.    BP at upper limits of normal today.  DM:  hba1c 7.1 vs 7 last time.  Weight also trending up.  Renal function stable.  Needs her diabetic supplies renewed.    She is having cataract surgery with Dr. Bing Plume coming up.  Next Tuesday 11/27 and then 12/18 for left eye and then right after (in a couple of weeks).    PT:  She had wanted therapy canceled a couple of visits.  Hip pain has returned.  It's a something is not right pain.  Walking on balance beams and backwards.  Sideways stuff causes the discomfort.  Says she'll keep it up for a while anyway after my encouragement.    Lasix--she feels she needs the lasix bid instead of daily due to bloating.  Her daughter got consent from cardiology for bid dosing.   Does not use added salt, but daughter disagrees.  Might be the processed foods and cooking and baking.  Twice a day lasix helps with some sob she gets when walking.  Weight went up and breathing problem went up with some wheezing when only on daily lasix.  Loses 6 lbs when  lasix resumed bid.  Discussed 1500mg -2000mg  daily sodium restriction.    Requests to get pneumovax on Monday.  Had prevnar 11/01/15.  Has had her flu shot.  Has to hold lasix and metformin before her cataract surgery.    Past Medical History:  Diagnosis Date  . Adjustment disorder 10/17/2005  . Atrial fibrillation ([place])   . Cataract    bilateral  . CHF (congestive heart failure) ([place])   . Contracture of knee joint 07/26/2009  . Coronary artery disease   . Degeneration of lumbar or lumbosacral intervertebral disc 07/26/2009  . Diabetes mellitus without complication ([place])   . Esophageal spasm   . Esophageal spasm   . Fibromyalgia   . Fibromyalgia   . Hearing loss 12/18/2010  . Heart murmur   . Hypercholesteremia   . Hypertension   . 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 Leaky heart valve   . Lyme disease   . Macular degeneration   . Macular degeneration of both eyes   . Migraine 10/17/2005  . Mixed incontinence 10/17/2005  . Osteoarthritis    multiple joints   . Overweight 09/19/2010  . Pneumonia   . PONV (postoperative nausea and vomiting)   . Postartificial menopausal syndrome 10/10/2009  . Prinzmetal angina ([place]) 10/17/2005  . Psoriasis 06/12/2006  .  Sciatica   . TIA (transient ischemic attack)   . Type II diabetes mellitus ([place])   . [name] 10/17/2005    Past Surgical History:  Procedure Laterality Date  . ABDOMINAL HYSTERECTOMY    . AORTIC VALVE REPLACEMENT  12/10/2013   porcine  . CHOLECYSTECTOMY  1981  . COLONOSCOPY  04/18/2006   Dr. Nelva Nay: internal hemorrhoids  . COLONOSCOPY  10/2003   Dr. Alphonsa Gin: hemorrhoids  . CORONARY ARTERY BYPASS GRAFT  2015  . ESOPHAGOGASTRODUODENOSCOPY  10/16/2013   Dr. Marla Roe: prior Nissen fundoplication intact, hypertonic LES, dilated up to 29 [place] with moderate resistance, gastritis but no H. pylori., Reactive gastritis, no celiac disease.  . ESOPHAGOGASTRODUODENOSCOPY  05/29/2012   Dr.  Doy Mince: Moderately severe esophagitis, acute gastritis reactive, no H pylori, no celiac. Esophageal biopsies consistent with GERD, no Barrett  . ESOPHAGOGASTRODUODENOSCOPY  03/21/2009   Dr. Doy Mince: Reflux esophagitis, gastritis without H. pylori, esophagus stretched 57 savory  . ESOPHAGOGASTRODUODENOSCOPY  02/20/2008   Dr. Doy Mince: Esophagus dilated to 39 French, reactive gastropathy with no H pylori. No Barrett's on esophageal biopsy  . ESOPHAGOGASTRODUODENOSCOPY  09/24/2006   Dr. Doy Mince: Tight wrap noted, reactive gastropathy, no Barrett's  . ESOPHAGOGASTRODUODENOSCOPY (EGD) WITH PROPOFOL N/A 01/09/2017   Performed by Daneil Dolin, MD at [place]  . FRACTURE SURGERY Left    wrist  . JOINT REPLACEMENT    . MALONEY DILATION N/A 01/09/2017   Performed by Daneil Dolin, MD at [place]  . MITRAL VALVE REPAIR  11/2013  . NISSEN FUNDOPLICATION  4401  . REPLACEMENT TOTAL KNEE BILATERAL  2010/2013    Allergies  Allergen Reactions  . Penicillins Anaphylaxis and Other (See Comments)    Has patient had a PCN reaction causing immediate rash, facial/tongue/throat swelling, SOB or lightheadedness with hypotension: Yes Has patient had a PCN reaction causing severe rash involving mucus membranes or skin necrosis: Yes Has patient had a PCN reaction that required hospitalization Yes Has patient had a PCN reaction occurring within the last 10 years: No If all of the above answers are "NO", then may proceed with Cephalosporin use.   . Latex Other (See Comments)    Redness and rash  . Morphine And Related Nausea Only and Other (See Comments)    Dizziness  . Tape Other (See Comments)    Redness and rash  . Iodine Other (See Comments)    Redness and rash    Outpatient Encounter Medications as of 02/11/2017  Medication Sig  . acetaminophen (TYLENOL) 650 MG CR tablet Take 650-1,300 mg by mouth every 8 (eight) hours as needed for pain.   Marland Kitchen aspirin EC 81 MG tablet Take 81 mg by  mouth every morning.   Marland Kitchen atorvastatin (LIPITOR) 80 MG tablet Take 1 tablet (80 mg total) by mouth daily.  . Calcium Carb-Cholecalciferol (CALTRATE 600+D3 SOFT PO) Take 1 tablet by mouth 2 (two) times daily.   . cetirizine (ZYRTEC) 10 MG tablet Take 10 mg by mouth daily as needed for allergies.   . Cholecalciferol (VITAMIN D3) 2000 units TABS Take 2,000 Units by mouth daily.   . diphenhydrAMINE-zinc acetate (BENADRYL) cream Apply 1 application topically 3 (three) times daily as needed for itching.  . fluticasone (FLONASE) 50 MCG/ACT nasal spray Place 2 sprays into both nostrils daily.  . furosemide (LASIX) 20 MG tablet Take 20 mg daily as needed for leg swelling (Patient taking differently: Take 20 mg by mouth 2 (two) times daily. )  . gabapentin (  NEURONTIN) 100 MG capsule Take 1 capsule (100 mg total) by mouth every morning. Take 2-3 tablets by mouth at bedtime  . loperamide (IMODIUM A-D) 2 MG tablet Take 2 mg by mouth as needed for diarrhea or loose stools.  Marland Kitchen losartan (COZAAR) 100 MG tablet Take 1 tablet (100 mg total) by mouth daily.  . Melatonin 10 MG TABS Take 10 mg by mouth at bedtime.   . metFORMIN (GLUCOPHAGE) 500 MG tablet Take 1 tablet (500 mg total) by mouth 2 (two) times daily with a meal. 8am and 7pm  . metoprolol tartrate (LOPRESSOR) 25 MG tablet Take 0.5 tablets (12.5 mg total) by mouth 2 (two) times daily. 8 am & 7 pm  . potassium chloride (K-DUR) 10 MEQ tablet Take potassium 10 meq only on days you take lasix (Patient taking differently: Take 10 mEq by mouth 2 (two) times daily. )  . warfarin (COUMADIN) 6 MG tablet Take 1 tablet (6 mg total) by mouth daily.  . [DISCONTINUED] azithromycin (ZITHROMAX) 500 MG tablet Take 1 tablet (500 mg total) by mouth daily. As directed  . [DISCONTINUED] enoxaparin (LOVENOX) 120 MG/0.8ML injection Inject 0.8 mLs (120 mg total) into the skin daily. At 8am   No facility-administered encounter medications on file as of 02/11/2017.     Review of  Systems:  Review of Systems  Constitutional: Negative for chills and fever.  HENT: Positive for congestion, ear pain and sore throat. Negative for sinus pain.   Eyes: Negative for blurred vision.       For cataract surgery  Respiratory: Negative for cough and shortness of breath.   Cardiovascular: Negative for chest pain and palpitations.  Gastrointestinal: Negative for abdominal pain, blood in stool, constipation and melena.  Genitourinary: Negative for dysuria.  Musculoskeletal: Negative for falls.  Skin: Negative for itching and rash.  Neurological: Positive for sensory change. Negative for dizziness and loss of consciousness.  Endo/Heme/Allergies: Bruises/bleeds easily.  Psychiatric/Behavioral: Negative for depression and memory loss.    Health Maintenance  Topic Date Due  . OPHTHALMOLOGY EXAM  10/10/2016  . INFLUENZA VACCINE  10/24/2016  . PNA vac Low Risk Adult (2 of 2 - PPSV23) 10/31/2016  . FOOT EXAM  12/05/2016  . HEMOGLOBIN A1C  08/07/2017  . MAMMOGRAM  11/22/2018  . COLONOSCOPY  03/26/2022  . TETANUS/TDAP  10/13/2026  . DEXA SCAN  Completed    Physical Exam: Vitals:   02/11/17 1036  BP: 140/80  Pulse: 64  Temp: 98.2 F (36.8 C)  TempSrc: Oral  SpO2: 99%  Weight: 162 lb (73.5 kg)   Body mass index is 29.63 kg/m. Physical Exam  Constitutional: She is oriented to person, place, and time. She appears well-developed and well-nourished. No distress.  HENT:  Head: Normocephalic and atraumatic.  Cardiovascular: Normal rate, regular rhythm and intact distal pulses.  Murmur heard. Pulmonary/Chest: Effort normal and breath sounds normal. No respiratory distress.  Abdominal: Bowel sounds are normal.  Neurological: She is alert and oriented to person, place, and time. No cranial nerve deficit.  Skin: Skin is warm and dry.  Psychiatric: She has a normal mood and affect.    Labs reviewed: Basic Metabolic Panel: Recent Labs    10/04/16 0958 01/03/17 0919  02/07/17 0913  NA 139 138 141  K 4.5 3.8 4.6  CL 103 102 101  CO2 28 29 32  GLUCOSE 128* 99 120*  BUN 20 24* 26*  CREATININE 0.85 1.13* 1.14*  CALCIUM 9.5 8.8* 9.8   Liver Function Tests:  Recent Labs    04/13/16 1132 10/04/16 0958  AST 20 19  ALT 16 17  ALKPHOS 91 99  BILITOT 0.5 0.5  PROT 7.0 6.8  ALBUMIN 4.2 4.0   No results for input(s): LIPASE, AMYLASE in the last 8760 hours. No results for input(s): AMMONIA in the last 8760 hours. CBC: Recent Labs    04/13/16 1132 10/04/16 0958 01/03/17 0919  WBC 9.3 8.8 8.6  NEUTROABS 6,231 5,896 5.3  HGB 12.6 12.4 11.7*  HCT 39.5 37.8 36.5  MCV 85.1 85.9 89.0  PLT 409* 394 316   Lipid Panel: Recent Labs    10/04/16 0958  CHOL 133  HDL 53  LDLCALC 57  TRIG 116  CHOLHDL 2.5   Lab Results  Component Value Date   HGBA1C 7.1 (H) 02/07/2017   Assessment/Plan 1. Type 2 diabetes mellitus without complication, without long-term current use of insulin (HCC) - Sugar is well controlled but trending up so encouraged her to work on diet and exercise program - cont metformin -hold the metformin morning of cataract surgery per their instructions at Dr. Rachael Fee -Hemoglobin A1c; Future - Lipid panel; Future  2. Viral URI -no signs of strep on examination -cont conservative therapies and return if not resolving after another week or symptoms not manageable with otc and herbal regimen - CBC with Differential/Platelet; Future  3. Paroxysmal atrial fibrillation (HCC) -cont coumadin therapy, rate controlled  4. Essential hypertension - bp at goal, but barely - decrease sodium in diet - Basic metabolic panel; Future - CBC with Differential/Platelet; Future  5. Chronic diastolic congestive heart failure ([place]) -now on lasix 20mg  po bid per cardiology according to her daughter, also on potassium -cut back on sodium in diet  6. Balance problem -cont PT for last two sessions  Labs/tests ordered:   Orders Placed This  Encounter  Procedures  . Basic metabolic panel    Standing Status:   Future    Standing Expiration Date:   10/11/2017    Order Specific Question:   Has the patient fasted?    Answer:   Yes  . CBC with Differential/Platelet    Standing Status:   Future    Standing Expiration Date:   10/11/2017  . Hemoglobin A1c    Standing Status:   Future    Standing Expiration Date:   10/11/2017  . Lipid panel    Standing Status:   Future    Standing Expiration Date:   10/11/2017    Order Specific Question:   Has the patient fasted?    Answer:   Yes   Next appt:  4 mos med mgt, labs before  [place] Arnaldo Heffron, D.O. [facility] [address], [place] [address] Cell Phone (Mon-Fri 8am-5pm):  [phone] On Call:  [phone] & follow prompts after 5pm & weekends Office Phone:  [phone] Office Fax:  [phone]

## 2017-02-19 DIAGNOSIS — H25812 Combined forms of age-related cataract, left eye: Secondary | ICD-10-CM | POA: Diagnosis not present

## 2017-02-19 DIAGNOSIS — H2512 Age-related nuclear cataract, left eye: Secondary | ICD-10-CM | POA: Diagnosis not present

## 2017-02-25 DIAGNOSIS — H2511 Age-related nuclear cataract, right eye: Secondary | ICD-10-CM | POA: Diagnosis not present

## 2017-03-08 ENCOUNTER — Ambulatory Visit (INDEPENDENT_AMBULATORY_CARE_PROVIDER_SITE_OTHER): Payer: Medicare Other | Admitting: *Deleted

## 2017-03-08 DIAGNOSIS — I4891 Unspecified atrial fibrillation: Secondary | ICD-10-CM

## 2017-03-08 DIAGNOSIS — Z5181 Encounter for therapeutic drug level monitoring: Secondary | ICD-10-CM

## 2017-03-08 DIAGNOSIS — Z8673 Personal history of transient ischemic attack (TIA), and cerebral infarction without residual deficits: Secondary | ICD-10-CM

## 2017-03-08 LAB — POCT INR: INR: 3.2

## 2017-03-08 NOTE — Patient Instructions (Signed)
Decrease coumadin to 1 tablet daily except 1/2 tablet on Saturdays Recheck in 4 weeks

## 2017-03-11 ENCOUNTER — Encounter: Payer: Self-pay | Admitting: Internal Medicine

## 2017-03-12 ENCOUNTER — Telehealth: Payer: Self-pay

## 2017-03-12 DIAGNOSIS — H25811 Combined forms of age-related cataract, right eye: Secondary | ICD-10-CM | POA: Diagnosis not present

## 2017-03-12 DIAGNOSIS — H2511 Age-related nuclear cataract, right eye: Secondary | ICD-10-CM | POA: Diagnosis not present

## 2017-03-12 MED ORDER — LISINOPRIL 5 MG PO TABS: 5.0000 mg | ORAL_TABLET | Freq: Every day | ORAL | 0 refills | Status: DC

## 2017-03-12 MED ORDER — LISINOPRIL 5 MG PO TABS: 5.0000 mg | ORAL_TABLET | Freq: Every day | ORAL | 3 refills | Status: DC

## 2017-03-12 NOTE — Telephone Encounter (Signed)
-----   Message from Arnoldo Lenis, MD sent at 03/11/2017 12:36 PM EST ----- Regarding: RE:  constant diarrhea  She had been on that class of medicine since I started seeing her in 2017. Not only does help bp but it has a lot of there benefits for her including helping prevent future heart attack or stroke and helps protect the kidneys in diabets. There is another medcine with similar benefits we could try, does she ever remember being on lisinopril?   Carlyle Dolly MD ----- Message ----- From: Drema Dallas, CMA Sent: 03/11/2017  11:45 AM To: Arnoldo Lenis, MD Subject:  constant diarrhea                             Pt's daughter came in office to discuss her mother's constant diarrhea. She states that since the valsartan recall when we switched her mother to losartan she has had constant severe diarrhea. Last week she stopped giving her the losartan just to be sure that was the cause, and the days she did not give it to her she had no diarrhea. She did keep a log of her blood pressure. 154/73, 114/52, 121/54,146/75, 131/60, 111/62, 142/54, 140/70 were her readings from last week not taking the losartan. Daughter asks if she needs to be on a blood pressure medication since her numbers were so good? And if she does could you please change the losartan? Lease advise.

## 2017-03-12 NOTE — Telephone Encounter (Signed)
Spoke with pt's daughter. Advised her of medication changes. She voiced understanding. Sent 2 weeks worth of  Lisinopril to [facility] in [place], and 90 days to [place].

## 2017-03-12 NOTE — Telephone Encounter (Signed)
Stop losartan, start lisinopril 5mg  daily   JBranch MD

## 2017-03-12 NOTE — Telephone Encounter (Signed)
Called pt's daughter. She states she does not recall her ever being on lisinopril. She will be willing to try it, but does not want to continue the losartan.

## 2017-03-12 NOTE — Addendum Note (Signed)
Addended by: Debbora Lacrosse R on: 03/12/2017 04:31 PM   Modules accepted: Orders

## 2017-03-23 ENCOUNTER — Other Ambulatory Visit: Payer: Self-pay | Admitting: Internal Medicine

## 2017-03-23 ENCOUNTER — Encounter: Payer: Self-pay | Admitting: Internal Medicine

## 2017-03-23 DIAGNOSIS — E118 Type 2 diabetes mellitus with unspecified complications: Secondary | ICD-10-CM

## 2017-03-30 IMAGING — MR MR MRA HEAD W/O CM
1 series · 13 of 48 positions shown · non-contrast
Comparison: 05/02/2015 brain MR. No comparison MR angiogram.

CLINICAL DATA: 74-year-old hypertensive female with right-sided
facial numbness and headache. Subsequent encounter.

EXAM:
MRA HEAD WITHOUT CONTRAST
TECHNIQUE: Angiographic images of the Circle of Willis were obtained using MRA
technique without intravenous contrast.

[Series 2: MRA · axial · 0.6mm · 0.26mm/px · z∈[-57,+56]mm · 13 of 200 slices shown]
[im 1/200]
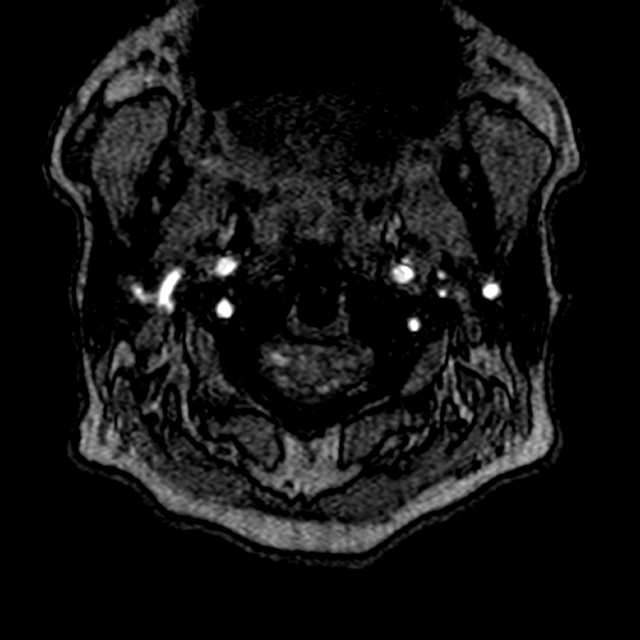
[im 5/200]
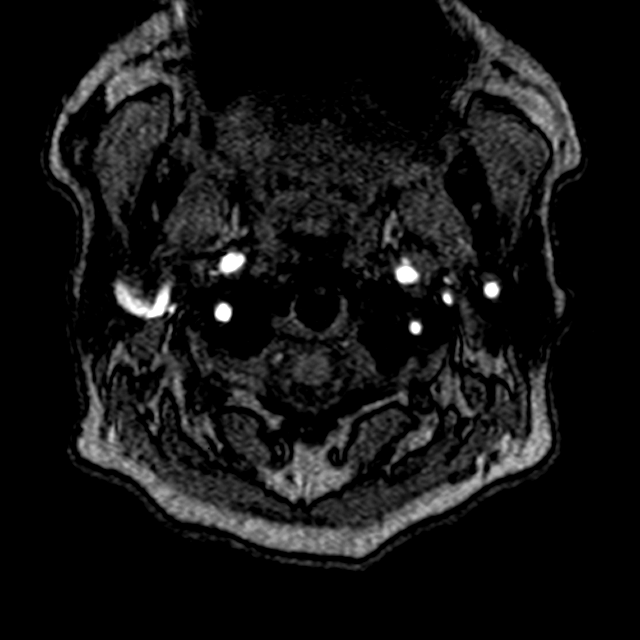
[im 13/200]
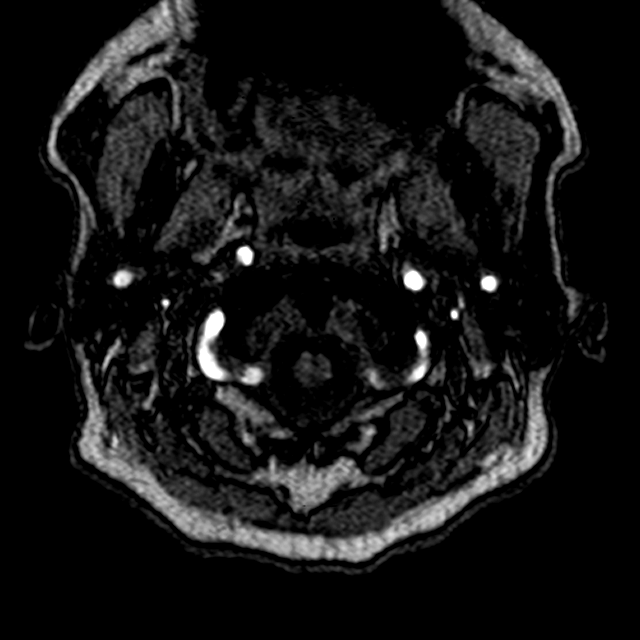
[im 34/200]
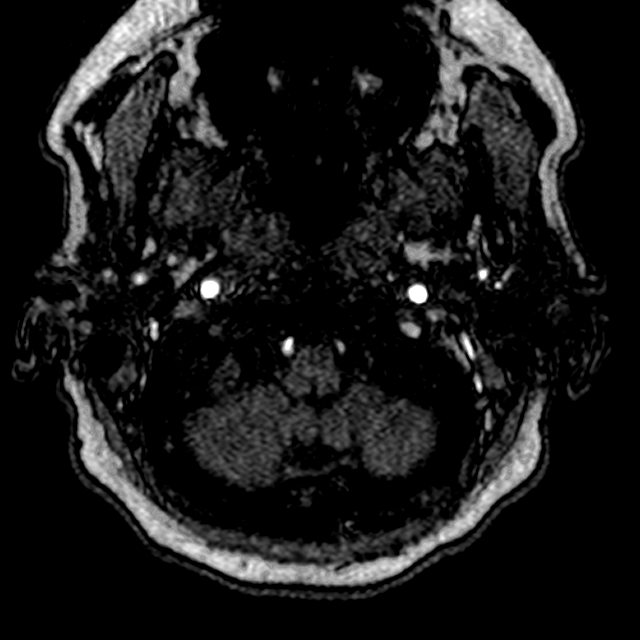
[im 39/200]
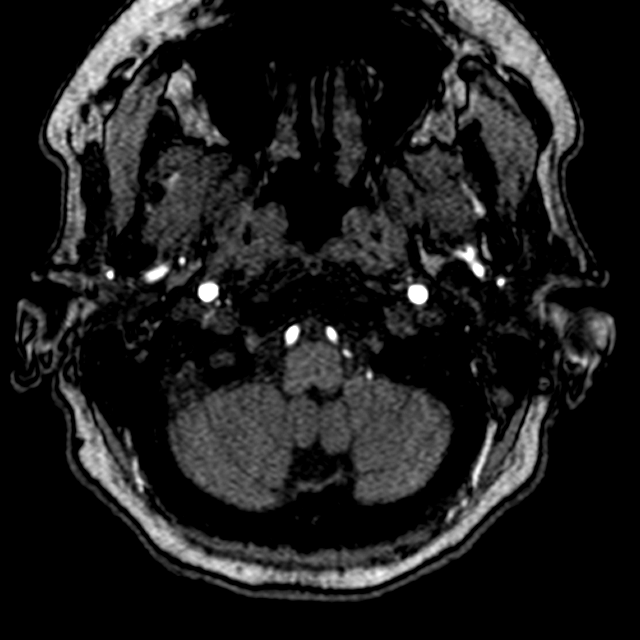
[im 64/200]
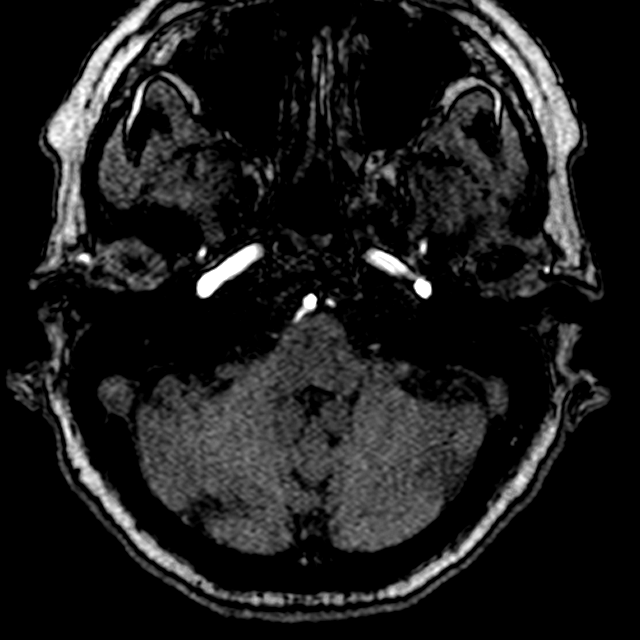
[im 89/200]
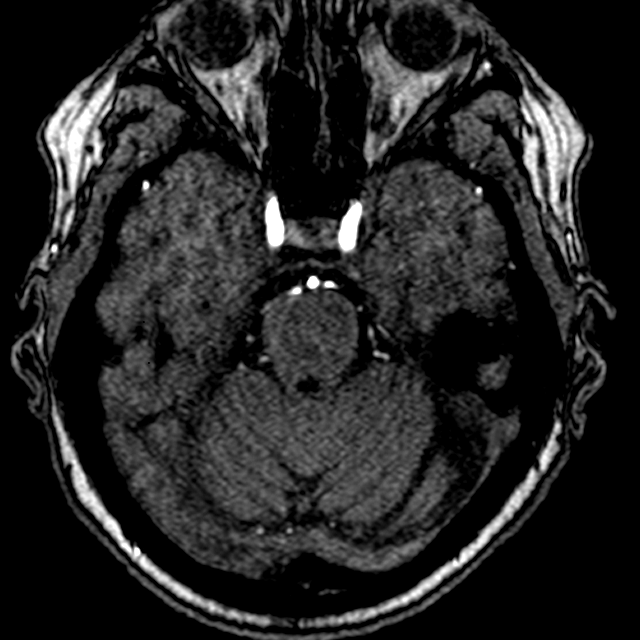
[im 102/200]
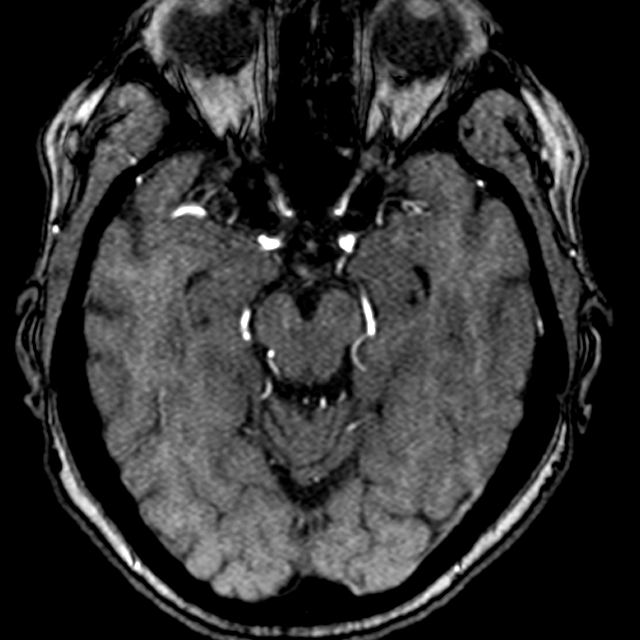
[im 115/200]
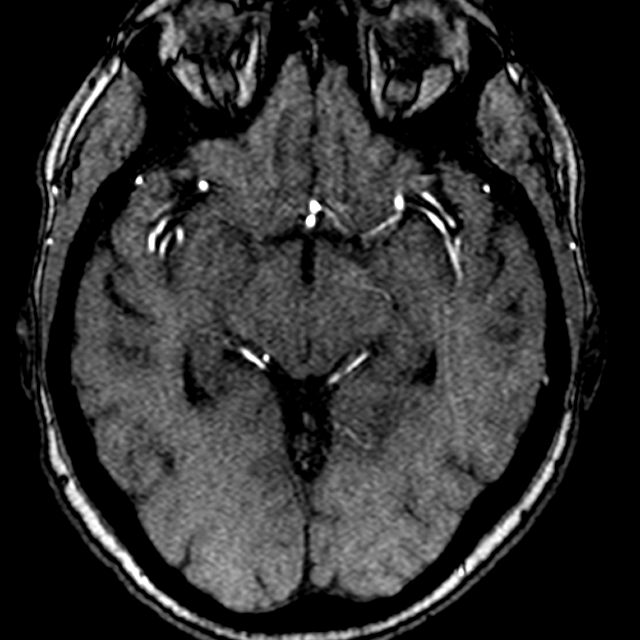
[im 140/200]
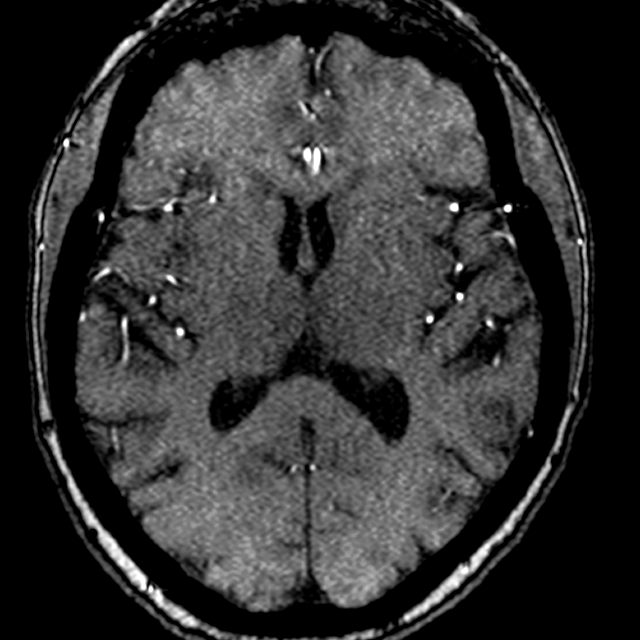
[im 166/200]
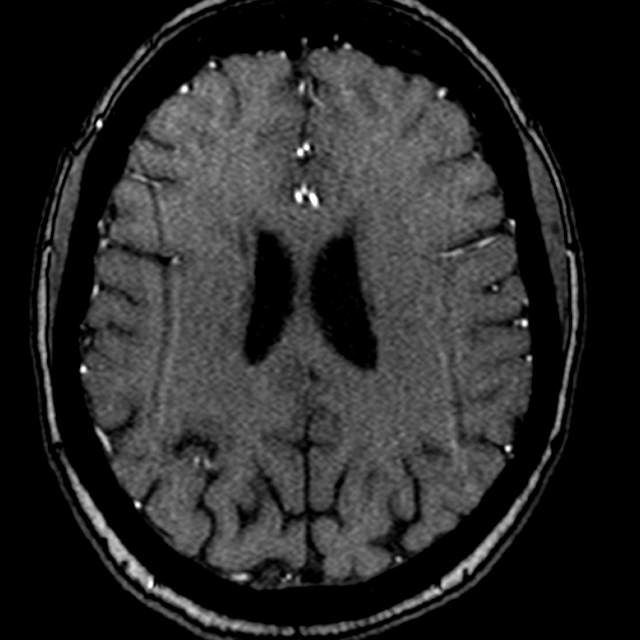
[im 170/200]
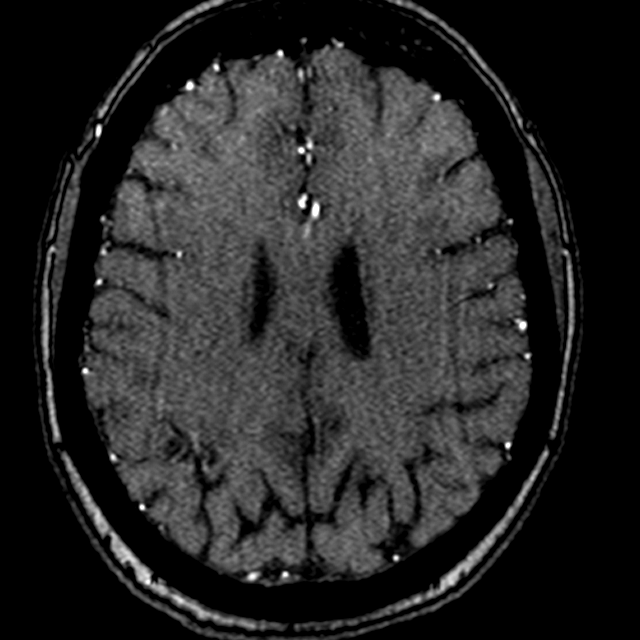
[im 191/200]
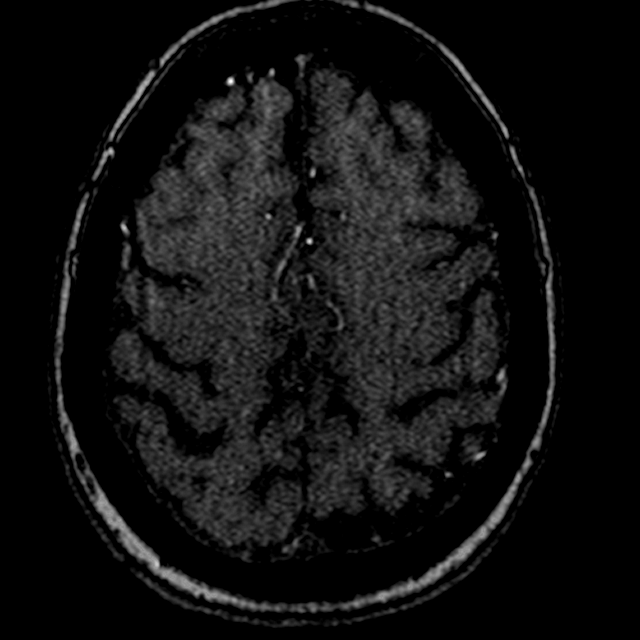

[13 of 48 positions shown; findings below may reference images not displayed]

FINDINGS: Mild narrowing cavernous segment internal carotid artery
bilaterally.

Mild moderate focal narrowing A1 segment right anterior cerebral
artery.

Middle cerebral artery mild branch vessel irregularity.

Right vertebral artery is dominant without significant narrowing.

Mild narrowing distal left vertebral artery proximal to the
posterior inferior cerebellar artery takeoff.

Nonvisualized right posterior inferior cerebellar artery. Narrowing
mid to distal left posterior inferior cerebellar artery.

Slight irregularity with minimal narrowing basilar artery without
high-grade stenosis.

Mild narrowing proximal and distal aspect anterior cerebral artery
bilaterally.

Mild to moderate tandem stenosis proximal to mid right posterior
cerebral artery. Narrowing and irregularity distal branches of the
posterior cerebral artery bilaterally.

No aneurysm noted.
IMPRESSION: Mild narrowing cavernous segment internal carotid artery
bilaterally.

Mild to moderate focal narrowing A1 segment right anterior cerebral
artery.

Middle cerebral artery mild branch vessel irregularity.

Right vertebral artery is dominant without significant narrowing.

Mild narrowing distal left vertebral artery proximal to the
posterior inferior cerebellar artery takeoff.

Nonvisualized right posterior inferior cerebellar artery. Narrowing
mid to distal left posterior inferior cerebellar artery.

Slight irregularity with minimal narrowing basilar artery without
high-grade stenosis.

Mild narrowing proximal and distal aspect anterior cerebral artery
bilaterally.

Mild to moderate tandem stenosis proximal to mid right posterior
cerebral artery. Narrowing and irregularity distal branches of the
posterior cerebral artery bilaterally.

## 2017-04-02 DIAGNOSIS — H43813 Vitreous degeneration, bilateral: Secondary | ICD-10-CM | POA: Diagnosis not present

## 2017-04-02 DIAGNOSIS — H353231 Exudative age-related macular degeneration, bilateral, with active choroidal neovascularization: Secondary | ICD-10-CM | POA: Diagnosis not present

## 2017-04-04 ENCOUNTER — Telehealth: Payer: Self-pay | Admitting: *Deleted

## 2017-04-04 ENCOUNTER — Ambulatory Visit: Payer: Medicare Other | Admitting: Neurology

## 2017-04-04 ENCOUNTER — Ambulatory Visit (INDEPENDENT_AMBULATORY_CARE_PROVIDER_SITE_OTHER): Payer: Medicare Other | Admitting: *Deleted

## 2017-04-04 ENCOUNTER — Encounter: Payer: Self-pay | Admitting: Cardiology

## 2017-04-04 DIAGNOSIS — Z5181 Encounter for therapeutic drug level monitoring: Secondary | ICD-10-CM | POA: Diagnosis not present

## 2017-04-04 DIAGNOSIS — Z8673 Personal history of transient ischemic attack (TIA), and cerebral infarction without residual deficits: Secondary | ICD-10-CM | POA: Diagnosis not present

## 2017-04-04 DIAGNOSIS — I4891 Unspecified atrial fibrillation: Secondary | ICD-10-CM | POA: Diagnosis not present

## 2017-04-04 LAB — POCT INR: INR: 2

## 2017-04-04 MED ORDER — FUROSEMIDE 20 MG PO TABS: 20.0000 mg | ORAL_TABLET | Freq: Two times a day (BID) | ORAL | 0 refills | Status: DC

## 2017-04-04 NOTE — Patient Instructions (Signed)
Take coumadin 1 1/2 tablets tonight then resume 1 tablet daily except 1/2 tablet on Saturdays Recheck in 4 weeks

## 2017-04-04 NOTE — Telephone Encounter (Signed)
Patient daughter notified. LM

## 2017-04-04 NOTE — Telephone Encounter (Signed)
Patient daughter, [name] called and stated that there has been a delay in patient getting her medications from [facility]. She stated that [facility] told her that it was because there was a interaction with the Warfarin and the other medications patient was taking. Daughter wanted to know if you knew anything about this. Daughter called and talked with [facility] and they sent medication out to her.   She stated that the pharmacy has sent the medications out to her but she is going to run out of the Lasix before receiving them and wants a local supply of #10 sent to [facility]. Faxed.

## 2017-04-04 NOTE — Telephone Encounter (Signed)
Noted.  I was not made aware of those concerns by the pharmacy.  Perhaps, her cardiologist was notified?

## 2017-04-26 ENCOUNTER — Other Ambulatory Visit: Payer: Medicare Other

## 2017-05-07 ENCOUNTER — Ambulatory Visit (INDEPENDENT_AMBULATORY_CARE_PROVIDER_SITE_OTHER): Payer: Medicare Other | Admitting: *Deleted

## 2017-05-07 DIAGNOSIS — Z5181 Encounter for therapeutic drug level monitoring: Secondary | ICD-10-CM | POA: Diagnosis not present

## 2017-05-07 DIAGNOSIS — Z8673 Personal history of transient ischemic attack (TIA), and cerebral infarction without residual deficits: Secondary | ICD-10-CM | POA: Diagnosis not present

## 2017-05-07 DIAGNOSIS — I4891 Unspecified atrial fibrillation: Secondary | ICD-10-CM

## 2017-05-07 LAB — POCT INR: INR: 1.9

## 2017-05-07 NOTE — Patient Instructions (Signed)
Take coumadin 1 1/2 tablets tonight then increase dose to 1 tablet daily Recheck in 4 weeks 

## 2017-05-13 ENCOUNTER — Ambulatory Visit: Payer: Medicare Other | Admitting: Gastroenterology

## 2017-05-14 ENCOUNTER — Ambulatory Visit: Payer: Medicare Other | Admitting: Gastroenterology

## 2017-06-03 ENCOUNTER — Other Ambulatory Visit: Payer: Self-pay | Admitting: Internal Medicine

## 2017-06-04 ENCOUNTER — Ambulatory Visit (INDEPENDENT_AMBULATORY_CARE_PROVIDER_SITE_OTHER): Payer: Medicare Other | Admitting: *Deleted

## 2017-06-04 DIAGNOSIS — Z8673 Personal history of transient ischemic attack (TIA), and cerebral infarction without residual deficits: Secondary | ICD-10-CM | POA: Diagnosis not present

## 2017-06-04 DIAGNOSIS — Z5181 Encounter for therapeutic drug level monitoring: Secondary | ICD-10-CM

## 2017-06-04 DIAGNOSIS — I4891 Unspecified atrial fibrillation: Secondary | ICD-10-CM | POA: Diagnosis not present

## 2017-06-04 LAB — POCT INR: INR: 3.3

## 2017-06-04 NOTE — Patient Instructions (Signed)
Decrease coumadin to 1 tablet daily except 1/2 tablet on Wednesdays Recheck in 3 weeks

## 2017-06-12 ENCOUNTER — Encounter: Payer: Self-pay | Admitting: Internal Medicine

## 2017-06-17 ENCOUNTER — Ambulatory Visit: Payer: Medicare Other | Admitting: Internal Medicine

## 2017-06-18 ENCOUNTER — Encounter: Payer: Self-pay | Admitting: Nurse Practitioner

## 2017-06-18 ENCOUNTER — Ambulatory Visit
Admission: RE | Admit: 2017-06-18 | Discharge: 2017-06-18 | Disposition: A | Discharge status: Home / Self Care | Payer: Medicare Other | Source: Ambulatory Visit | Attending: Nurse Practitioner | Admitting: Nurse Practitioner

## 2017-06-18 ENCOUNTER — Ambulatory Visit (INDEPENDENT_AMBULATORY_CARE_PROVIDER_SITE_OTHER): Payer: Medicare Other | Admitting: Nurse Practitioner

## 2017-06-18 VITALS — BP 134/78 | HR 65 | Temp 98.6°F | Ht 62.0 in | Wt 168.4 lb

## 2017-06-18 DIAGNOSIS — R0989 Other specified symptoms and signs involving the circulatory and respiratory systems: Secondary | ICD-10-CM | POA: Diagnosis not present

## 2017-06-18 DIAGNOSIS — J0140 Acute pansinusitis, unspecified: Secondary | ICD-10-CM | POA: Diagnosis not present

## 2017-06-18 DIAGNOSIS — H6122 Impacted cerumen, left ear: Secondary | ICD-10-CM

## 2017-06-18 NOTE — Patient Instructions (Signed)
neti pot twice daily To use flonase after netipot Plain nasal saline spray throughout the day as needed May use tylenol 325 mg 2 tablets every 6 hours as needed aches and pains or sore throat humidifier in the home to help with the dry air Mucinex DM by mouth twice daily for 7 days then as needed for cough and congestion with full glass of water  Keep well hydrated Avoid forcefully blowing nose To use zyrtec (cetirizine)  or Claritin (loratadine) daily    Sinusitis, Adult Sinusitis is soreness and inflammation of your sinuses. Sinuses are hollow spaces in the bones around your face. Your sinuses are located:  Around your eyes.  In the middle of your forehead.  Behind your nose.  In your cheekbones.  Your sinuses and nasal passages are lined with a stringy fluid (mucus). Mucus normally drains out of your sinuses. When your nasal tissues become inflamed or swollen, the mucus can become trapped or blocked so air cannot flow through your sinuses. This allows bacteria, viruses, and funguses to grow, which leads to infection. Sinusitis can develop quickly and last for 7?10 days (acute) or for more than 12 weeks (chronic). Sinusitis often develops after a cold. What are the causes? This condition is caused by anything that creates swelling in the sinuses or stops mucus from draining, including:  Allergies.  Asthma.  Bacterial or viral infection.  Abnormally shaped bones between the nasal passages.  Nasal growths that contain mucus (nasal polyps).  Narrow sinus openings.  Pollutants, such as chemicals or irritants in the air.  A foreign object stuck in the nose.  A fungal infection. This is rare.  What increases the risk? The following factors may make you more likely to develop this condition:  Having allergies or asthma.  Having had a recent cold or respiratory tract infection.  Having structural deformities or blockages in your nose or sinuses.  Having a weak immune  system.  Doing a lot of swimming or diving.  Overusing nasal sprays.  Smoking.  What are the signs or symptoms? The main symptoms of this condition are pain and a feeling of pressure around the affected sinuses. Other symptoms include:  Upper toothache.  Earache.  Headache.  Bad breath.  Decreased sense of smell and taste.  A cough that may get worse at night.  Fatigue.  Fever.  Thick drainage from your nose. The drainage is often green and it may contain pus (purulent).  Stuffy nose or congestion.  Postnasal drip. This is when extra mucus collects in the throat or back of the nose.  Swelling and warmth over the affected sinuses.  Sore throat.  Sensitivity to light.  How is this diagnosed? This condition is diagnosed based on symptoms, a medical history, and a physical exam. To find out if your condition is acute or chronic, your health care provider may:  Look in your nose for signs of nasal polyps.  Tap over the affected sinus to check for signs of infection.  View the inside of your sinuses using an imaging device that has a light attached (endoscope).  If your health care provider suspects that you have chronic sinusitis, you may also:  Be tested for allergies.  Have a sample of mucus taken from your nose (nasal culture) and checked for bacteria.  Have a mucus sample examined to see if your sinusitis is related to an allergy.  If your sinusitis does not respond to treatment and it lasts longer than 8 weeks, you  may have an MRI or CT scan to check your sinuses. These scans also help to determine how severe your infection is. In rare cases, a bone biopsy may be done to rule out more serious types of fungal sinus disease. How is this treated? Treatment for sinusitis depends on the cause and whether your condition is chronic or acute. If a virus is causing your sinusitis, your symptoms will go away on their own within 10 days. You may be given medicines to  relieve your symptoms, including:  Topical nasal decongestants. They shrink swollen nasal passages and let mucus drain from your sinuses.  Antihistamines. These drugs block inflammation that is triggered by allergies. This can help to ease swelling in your nose and sinuses.  Topical nasal corticosteroids. These are nasal sprays that ease inflammation and swelling in your nose and sinuses.  Nasal saline washes. These rinses can help to get rid of thick mucus in your nose.  If your condition is caused by bacteria, you will be given an antibiotic medicine. If your condition is caused by a fungus, you will be given an antifungal medicine. Surgery may be needed to correct underlying conditions, such as narrow nasal passages. Surgery may also be needed to remove polyps. Follow these instructions at home: Medicines  Take, use, or apply over-the-counter and prescription medicines only as told by your health care provider. These may include nasal sprays.  If you were prescribed an antibiotic medicine, take it as told by your health care provider. Do not stop taking the antibiotic even if you start to feel better. Hydrate and Humidify  Drink enough water to keep your urine clear or pale yellow. Staying hydrated will help to thin your mucus.  Use a cool mist humidifier to keep the humidity level in your home above 50%.  Inhale steam for 10-15 minutes, 3-4 times a day or as told by your health care provider. You can do this in the bathroom while a hot shower is running.  Limit your exposure to cool or dry air. Rest  Rest as much as possible.  Sleep with your head raised (elevated).  Make sure to get enough sleep each night. General instructions  Apply a warm, moist washcloth to your face 3-4 times a day or as told by your health care provider. This will help with discomfort.  Wash your hands often with soap and water to reduce your exposure to viruses and other germs. If soap and water are  not available, use hand sanitizer.  Do not smoke. Avoid being around people who are smoking (secondhand smoke).  Keep all follow-up visits as told by your health care provider. This is important. Contact a health care provider if:  You have a fever.  Your symptoms get worse.  Your symptoms do not improve within 10 days. Get help right away if:  You have a severe headache.  You have persistent vomiting.  You have pain or swelling around your face or eyes.  You have vision problems.  You develop confusion.  Your neck is stiff.  You have trouble breathing. This information is not intended to replace advice given to you by your health care provider. Make sure you discuss any questions you have with your health care provider. Document Released: 03/12/2005 Document Revised: 11/06/2015 Document Reviewed: 01/05/2015 Elsevier Interactive Patient Education  [employer].

## 2017-06-18 NOTE — Progress Notes (Signed)
Careteam: Patient Care Team: Gayland Curry, DO as PCP - General (Geriatric Medicine) Gala Romney Cristopher Estimable, MD as Consulting Physician (Gastroenterology)  Advanced Directive information    Allergies  Allergen Reactions  . Penicillins Anaphylaxis and Other (See Comments)    Has patient had a PCN reaction causing immediate rash, facial/tongue/throat swelling, SOB or lightheadedness with hypotension: Yes Has patient had a PCN reaction causing severe rash involving mucus membranes or skin necrosis: Yes Has patient had a PCN reaction that required hospitalization Yes Has patient had a PCN reaction occurring within the last 10 years: No If all of the above answers are "NO", then may proceed with Cephalosporin use.   . Latex Other (See Comments)    Redness and rash  . Morphine And Related Nausea Only and Other (See Comments)    Dizziness  . Tape Other (See Comments)    Redness and rash  . Iodine Other (See Comments)    Redness and rash    Chief Complaint  Patient presents with  . Acute Visit    Pt is being seen due to head congestion, non-productive, dry cough for 6 days  . Other    Daughter in room     HPI: Patient is a 77 y.o. female seen in the office today due to head congestion.  Reports cough (dry), head and nasal congestion, diarrhea.  Some shortness of breath with exertion.  Reports symptoms all started on 06/12/17 Reports everyone has gotten this in her house.  Using orange juice, olive oil and honey which helps her symptoms Taking dayquil and benadryl.  Sore throat due to drainage, drink is helping with that.    Review of Systems:  Review of Systems  Constitutional: Positive for malaise/fatigue. Negative for chills and fever.  HENT: Positive for congestion. Negative for ear pain, hearing loss, sinus pain, sore throat and tinnitus.   Respiratory: Positive for cough and shortness of breath. Negative for sputum production and wheezing.   Cardiovascular: Negative  for chest pain and palpitations.  Neurological: Positive for dizziness (minior, 1 time episode), weakness and headaches.    Past Medical History:  Diagnosis Date  . Adjustment disorder 10/17/2005  . Atrial fibrillation ([place])   . Cataract    bilateral  . CHF (congestive heart failure) ([place])   . Contracture of knee joint 07/26/2009  . Coronary artery disease   . Degeneration of lumbar or lumbosacral intervertebral disc 07/26/2009  . Diabetes mellitus without complication ([place])   . Esophageal spasm   . Esophageal spasm   . Fibromyalgia   . Fibromyalgia   . Hearing loss 12/18/2010  . Heart murmur   . Hypercholesteremia   . Hypertension   . 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 Leaky heart valve   . Lyme disease   . Macular degeneration   . Macular degeneration of both eyes   . Migraine 10/17/2005  . Mixed incontinence 10/17/2005  . Osteoarthritis    multiple joints   . Overweight 09/19/2010  . Pneumonia   . PONV (postoperative nausea and vomiting)   . Postartificial menopausal syndrome 10/10/2009  . Prinzmetal angina ([place]) 10/17/2005  . Psoriasis 06/12/2006  . Sciatica   . TIA (transient ischemic attack)   . Type II diabetes mellitus ([place])   . [name] 10/17/2005   Past Surgical History:  Procedure Laterality Date  . ABDOMINAL HYSTERECTOMY    . AORTIC VALVE REPLACEMENT  12/10/2013   porcine  . CHOLECYSTECTOMY  1981  .  COLONOSCOPY  04/18/2006   Dr. Nelva Nay: internal hemorrhoids  . COLONOSCOPY  10/2003   Dr. Alphonsa Gin: hemorrhoids  . CORONARY ARTERY BYPASS GRAFT  2015  . ESOPHAGOGASTRODUODENOSCOPY  10/16/2013   Dr. Marla Roe: prior Nissen fundoplication intact, hypertonic LES, dilated up to 63 [place] with moderate resistance, gastritis but no H. pylori., Reactive gastritis, no celiac disease.  . ESOPHAGOGASTRODUODENOSCOPY  05/29/2012   Dr. Doy Mince: Moderately severe esophagitis, acute gastritis reactive, no H pylori, no celiac.  Esophageal biopsies consistent with GERD, no Barrett  . ESOPHAGOGASTRODUODENOSCOPY  03/21/2009   Dr. Doy Mince: Reflux esophagitis, gastritis without H. pylori, esophagus stretched 57 savory  . ESOPHAGOGASTRODUODENOSCOPY  02/20/2008   Dr. Doy Mince: Esophagus dilated to 67 [place], reactive gastropathy with no H pylori. No Barrett's on esophageal biopsy  . ESOPHAGOGASTRODUODENOSCOPY  09/24/2006   Dr. Doy Mince: Tight wrap noted, reactive gastropathy, no Barrett's  . ESOPHAGOGASTRODUODENOSCOPY (EGD) WITH PROPOFOL N/A 01/09/2017   Procedure: ESOPHAGOGASTRODUODENOSCOPY (EGD) WITH PROPOFOL;  Surgeon: Daneil Dolin, MD;  Location[facility];  Service: Endoscopy;  Laterality: N/A;  2:15PM  . FRACTURE SURGERY Left    wrist  . JOINT REPLACEMENT    . MALONEY DILATION N/A 01/09/2017   Procedure: Venia Minks DILATION;  Surgeon: Daneil Dolin, MD;  Location: [facility];  Service: Endoscopy;  Laterality: N/A;  . MITRAL VALVE REPAIR  11/2013  . NISSEN FUNDOPLICATION  8144  . REPLACEMENT TOTAL KNEE BILATERAL  2010/2013   Social History:   reports that she quit smoking about 50 years ago. Her smoking use included cigarettes. She has a 20.00 pack-year smoking history. She has never used smokeless tobacco. She reports that she does not drink alcohol or use drugs.  Family History  Problem Relation Age of Onset  . Stroke Mother   . Heart failure Mother   . Heart disease Mother   . Mitral valve prolapse Mother   . Diabetes Father   . Heart attack Father   . Stroke Father   . Heart disease Father   . Hypertension Father   . Alzheimer's disease Father   . Stroke Maternal Grandmother   . Arthritis Maternal Grandfather        hands  . Breast cancer Paternal Grandmother   . Osteoporosis Paternal Grandmother   . Breast cancer Cousin   . Colon cancer Neg Hx     Medications: Patient's Medications  New Prescriptions   No medications on file  Previous Medications   ACETAMINOPHEN (TYLENOL) 650 MG  CR TABLET    Take 650-1,300 mg by mouth every 8 (eight) hours as needed for pain.    ASPIRIN EC 81 MG TABLET    Take 81 mg by mouth every morning.    ATORVASTATIN (LIPITOR) 80 MG TABLET    TAKE 1 TABLET EVERY DAY   CALCIUM CARB-CHOLECALCIFEROL (CALTRATE 600+D3 SOFT PO)    Take 1 tablet by mouth 2 (two) times daily.    CETIRIZINE (ZYRTEC) 10 MG TABLET    Take 10 mg by mouth daily as needed for allergies.    CHOLECALCIFEROL (VITAMIN D3) 2000 UNITS TABS    Take 2,000 Units by mouth daily.    DIPHENHYDRAMINE-ZINC ACETATE (BENADRYL) CREAM    Apply 1 application topically 3 (three) times daily as needed for itching.   FLUTICASONE (FLONASE) 50 MCG/ACT NASAL SPRAY    Place 2 sprays into both nostrils daily.   FUROSEMIDE (LASIX) 20 MG TABLET    Take 1 tablet (20 mg total) by mouth 2 (two) times  daily.   GABAPENTIN (NEURONTIN) 100 MG CAPSULE    TAKE 1 CAPSULE  EVERY MORNING AND TAKE 2 TO 3 CAPSULES  AT BEDTIME   LISINOPRIL (PRINIVIL,ZESTRIL) 5 MG TABLET    Take 5 mg by mouth daily.   LOPERAMIDE (IMODIUM A-D) 2 MG TABLET    Take 2 mg by mouth as needed for diarrhea or loose stools.   MELATONIN 10 MG TABS    Take 10 mg by mouth at bedtime.    METFORMIN (GLUCOPHAGE) 500 MG TABLET    TAKE 1 TABLET TWICE DAILY WITH A MEAL AT 8AM AND 7PM   METOPROLOL TARTRATE (LOPRESSOR) 25 MG TABLET    TAKE 1/2 TABLETS TWO TIMES DAILY AT 8 AM & 7 PM   POTASSIUM CHLORIDE (K-DUR) 10 MEQ TABLET    Take 10 mEq by mouth 2 (two) times daily. Take with lasix   WARFARIN (COUMADIN) 6 MG TABLET    TAKE 1 TABLET DAILY.  Modified Medications   No medications on file  Discontinued Medications   LISINOPRIL (PRINIVIL,ZESTRIL) 5 MG TABLET    Take 1 tablet (5 mg total) by mouth daily.   POTASSIUM CHLORIDE (K-DUR) 10 MEQ TABLET    Take potassium 10 meq only on days you take lasix     Physical Exam:  Vitals:   06/18/17 0954  BP: 134/78  Pulse: 65  Temp: 98.6 F (37 C)  TempSrc: Oral  SpO2: 98%  Weight: 168 lb 6.4 oz (76.4 kg)    Height: 5\' 2"  (1.575 m)   Body mass index is 30.8 kg/m.  Physical Exam  Constitutional: She is oriented to person, place, and time. She appears well-developed and well-nourished. No distress.  HENT:  Head: Normocephalic and atraumatic.  Right Ear: External ear normal.  Left Ear: External ear normal.  Nose: Mucosal edema and rhinorrhea present.  Mouth/Throat: Oropharynx is clear and moist. No oropharyngeal exudate.  Eyes: Pupils are equal, round, and reactive to light. EOM are normal.  Neck: Normal range of motion. Neck supple.  Cardiovascular: Normal rate, regular rhythm and intact distal pulses.  Murmur heard. Pulmonary/Chest: Effort normal and breath sounds normal. No respiratory distress. She has no wheezes. She exhibits no tenderness.  Abdominal: Soft. Bowel sounds are normal.  Lymphadenopathy:    She has no cervical adenopathy.  Neurological: She is alert and oriented to person, place, and time. No cranial nerve deficit.  Skin: Skin is warm and dry.  Psychiatric: She has a normal mood and affect.    Labs reviewed: Basic Metabolic Panel: Recent Labs    10/04/16 0958 01/03/17 0919 02/07/17 0913  NA 139 138 141  K 4.5 3.8 4.6  CL 103 102 101  CO2 28 29 32  GLUCOSE 128* 99 120*  BUN 20 24* 26*  CREATININE 0.85 1.13* 1.14*  CALCIUM 9.5 8.8* 9.8   Liver Function Tests: Recent Labs    10/04/16 0958  AST 19  ALT 17  ALKPHOS 99  BILITOT 0.5  PROT 6.8  ALBUMIN 4.0   No results for input(s): LIPASE, AMYLASE in the last 8760 hours. No results for input(s): AMMONIA in the last 8760 hours. CBC: Recent Labs    10/04/16 0958 01/03/17 0919  WBC 8.8 8.6  NEUTROABS 5,896 5.3  HGB 12.4 11.7*  HCT 37.8 36.5  MCV 85.9 89.0  PLT 394 316   Lipid Panel: Recent Labs    10/04/16 0958  CHOL 133  HDL 53  LDLCALC 57  TRIG 116  CHOLHDL 2.5  TSH: No results for input(s): TSH in the last 8760 hours. A1C: Lab Results  Component Value Date   HGBA1C 7.1 (H)  02/07/2017     Assessment/Plan 1. Acute non-recurrent pansinusitis neti pot twice daily Plain nasal saline spray throughout the day as needed May use tylenol 325 mg 2 tablets every 6 hours as needed aches and pains or sore throat humidifier in the home to help with the dry air Mucinex DM by mouth twice daily as needed for cough and congestion with full glass of water  Keep well hydrated Avoid forcefully blowing nose Flonase twice daily -zyrtec 10 mg daily - DG Chest 2 View- due to chest tightness which appears to be related to chest congestion but daughter reports she had pneumonia they are concerned over this.   2. Impacted cerumen of left ear - Ear Lavage - pt tolerated well, cerumen removed.   Next appt: 07/24/2017 Carlos American. [place], [facility] [phone]

## 2017-06-19 ENCOUNTER — Telehealth: Payer: Self-pay | Admitting: *Deleted

## 2017-06-19 NOTE — Telephone Encounter (Signed)
[name], daughter called and stated that she was returning Terry's call for Chest X-Ray results done yesterday.  Imaging results given and daughter agreed. Daughter stated that patient was feeling better this morning.   I could not pull up the Chest X-Ray results to comment on them.

## 2017-06-24 ENCOUNTER — Encounter: Payer: Self-pay | Admitting: Cardiology

## 2017-06-25 ENCOUNTER — Ambulatory Visit (INDEPENDENT_AMBULATORY_CARE_PROVIDER_SITE_OTHER): Payer: Medicare Other | Admitting: *Deleted

## 2017-06-25 DIAGNOSIS — Z5181 Encounter for therapeutic drug level monitoring: Secondary | ICD-10-CM | POA: Diagnosis not present

## 2017-06-25 DIAGNOSIS — Z8673 Personal history of transient ischemic attack (TIA), and cerebral infarction without residual deficits: Secondary | ICD-10-CM

## 2017-06-25 DIAGNOSIS — I4891 Unspecified atrial fibrillation: Secondary | ICD-10-CM

## 2017-06-25 LAB — POCT INR: INR: 2.8

## 2017-06-25 NOTE — Patient Instructions (Signed)
Continue coumadin 1 tablet daily except 1/2 tablet on Wednesdays Recheck in 4 weeks

## 2017-07-02 DIAGNOSIS — H353231 Exudative age-related macular degeneration, bilateral, with active choroidal neovascularization: Secondary | ICD-10-CM | POA: Diagnosis not present

## 2017-07-02 DIAGNOSIS — H43813 Vitreous degeneration, bilateral: Secondary | ICD-10-CM | POA: Diagnosis not present

## 2017-07-03 ENCOUNTER — Encounter: Payer: Self-pay | Admitting: Internal Medicine

## 2017-07-05 ENCOUNTER — Ambulatory Visit (INDEPENDENT_AMBULATORY_CARE_PROVIDER_SITE_OTHER): Payer: Medicare Other | Admitting: Internal Medicine

## 2017-07-05 ENCOUNTER — Encounter: Payer: Self-pay | Admitting: Internal Medicine

## 2017-07-05 ENCOUNTER — Encounter: Payer: Self-pay | Admitting: Cardiology

## 2017-07-05 VITALS — BP 124/76 | HR 59 | Temp 99.1°F | Ht 62.0 in | Wt 168.0 lb

## 2017-07-05 DIAGNOSIS — Z87891 Personal history of nicotine dependence: Secondary | ICD-10-CM | POA: Diagnosis not present

## 2017-07-05 DIAGNOSIS — Z79899 Other long term (current) drug therapy: Secondary | ICD-10-CM

## 2017-07-05 DIAGNOSIS — J302 Other seasonal allergic rhinitis: Secondary | ICD-10-CM | POA: Diagnosis not present

## 2017-07-05 DIAGNOSIS — J209 Acute bronchitis, unspecified: Secondary | ICD-10-CM

## 2017-07-05 DIAGNOSIS — E119 Type 2 diabetes mellitus without complications: Secondary | ICD-10-CM

## 2017-07-05 MED ORDER — IPRATROPIUM-ALBUTEROL 0.5-2.5 (3) MG/3ML IN SOLN
3.0000 mL | Freq: Once | RESPIRATORY_TRACT | Status: AC
  Administered 2017-07-05: 3 mL via RESPIRATORY_TRACT

## 2017-07-05 MED ORDER — AZITHROMYCIN 250 MG PO TABS: ORAL_TABLET | ORAL | 0 refills | Status: DC

## 2017-07-05 MED ORDER — IPRATROPIUM-ALBUTEROL 0.5-2.5 (3) MG/3ML IN SOLN: 3.0000 mL | Freq: Once | RESPIRATORY_TRACT | Status: DC

## 2017-07-05 MED ORDER — PREDNISONE 10 MG PO TABS: ORAL_TABLET | ORAL | 0 refills | Status: DC

## 2017-07-05 MED ORDER — ALBUTEROL SULFATE HFA 108 (90 BASE) MCG/ACT IN AERS: INHALATION_SPRAY | RESPIRATORY_TRACT | 2 refills | Status: DC

## 2017-07-05 NOTE — Patient Instructions (Signed)
Please schedule follow up appt with coumadin clinic due to antibiotic and prednisone use  START ZPAK AS DIRECTED  START PREDNISONE TAPER AS DIRECTED (10MG  TABS TAKE 4 BY MOUTH X 2 DAYS --> 3 ---> 2 ---->1 AND STOP)  START INHALER TAPER (2 PUFFS 3 TIMES DAILY X 7 DAYS --> 2 TIMES DAILY ---> 1 TIME DAILY THEN STOP)  Take probiotic daily while on antibiotic   Continue other medications as ordered  Push fluids and rest  May continue cough drops and tylenol as needed  Take tessalon perles 3 times daily as needed for cough. Please swallow without liquid  Follow up as scheduled with Dr Mariea Clonts or sooner if need be   Acute Bronchitis, Adult Acute bronchitis is when air tubes (bronchi) in the lungs suddenly get swollen. The condition can make it hard to breathe. It can also cause these symptoms:  A cough.  Coughing up clear, yellow, or green mucus.  Wheezing.  Chest congestion.  Shortness of breath.  A fever.  Body aches.  Chills.  A sore throat.  Follow these instructions at home: Medicines  Take over-the-counter and prescription medicines only as told by your doctor.  If you were prescribed an antibiotic medicine, take it as told by your doctor. Do not stop taking the antibiotic even if you start to feel better. General instructions  Rest.  Drink enough fluids to keep your pee (urine) clear or pale yellow.  Avoid smoking and secondhand smoke. If you smoke and you need help quitting, ask your doctor. Quitting will help your lungs heal faster.  Use an inhaler, cool mist vaporizer, or humidifier as told by your doctor.  Keep all follow-up visits as told by your doctor. This is important. How is this prevented? To lower your risk of getting this condition again:  Wash your hands often with soap and water. If you cannot use soap and water, use hand sanitizer.  Avoid contact with people who have cold symptoms.  Try not to touch your hands to your mouth, nose, or  eyes.  Make sure to get the flu shot every year.  Contact a doctor if:  Your symptoms do not get better in 2 weeks. Get help right away if:  You cough up blood.  You have chest pain.  You have very bad shortness of breath.  You become dehydrated.  You faint (pass out) or keep feeling like you are going to pass out.  You keep throwing up (vomiting).  You have a very bad headache.  Your fever or chills gets worse. This information is not intended to replace advice given to you by your health care provider. Make sure you discuss any questions you have with your health care provider. Document Released: 08/29/2007 Document Revised: 10/19/2015 Document Reviewed: 08/31/2015 Elsevier Interactive Patient Education  [employer].

## 2017-07-05 NOTE — Progress Notes (Signed)
Patient ID: Jeanette Yates, female   DOB: May 16, 1940, 77 y.o.   MRN: [id number]   [facility] OFFICE  Provider: DR Arletha Grippe  Code Status:  Goals of Care:  Advanced Directives 02/11/2017  Does Patient Have a Medical Advance Directive? Yes  Type of Paramedic of [place];Living will  Does patient want to make changes to medical advance directive? No - Patient declined  Copy of [place] in Chart? Yes  Would patient like information on creating a medical advance directive? -     Chief Complaint  Patient presents with  . Acute Visit    Breathing concerns, sinus pressure, congestion, diarrhea, vomiting (last episode 2 days ago) and dizzness. Tried mucinex, imodium, and tylenol   . Medication Refill    No refills needed     HPI: Patient is a 77 y.o. female seen today for an acute visit for cough x 3 days. She was tx for similar sx's in March 2019 and sx's resolved. She now has severe nonproductive cough with wheezing, CP. She had 1 episode of N/V 2 days ago. No sore throat but it feels irritated. No f/c. She tried OTC mucinex DM, tylenol wo relief. She is on coumadin with goal INR 2-3 for afib. INR 2.8 on 06/25/17.     Past Medical History:  Diagnosis Date  . Adjustment disorder 10/17/2005  . Atrial fibrillation ([place])   . Cataract    bilateral  . CHF (congestive heart failure) ([place])   . Contracture of knee joint 07/26/2009  . Coronary artery disease   . Degeneration of lumbar or lumbosacral intervertebral disc 07/26/2009  . Diabetes mellitus without complication ([place])   . Esophageal spasm   . Esophageal spasm   . Fibromyalgia   . Fibromyalgia   . Hearing loss 12/18/2010  . Heart murmur   . Hypercholesteremia   . Hypertension   . 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 Leaky heart valve   . Lyme disease   . Macular degeneration   . Macular degeneration of both eyes   . Migraine 10/17/2005  . Mixed  incontinence 10/17/2005  . Osteoarthritis    multiple joints   . Overweight 09/19/2010  . Pneumonia   . PONV (postoperative nausea and vomiting)   . Postartificial menopausal syndrome 10/10/2009  . Prinzmetal angina ([place]) 10/17/2005  . Psoriasis 06/12/2006  . Sciatica   . TIA (transient ischemic attack)   . Type II diabetes mellitus ([place])   . [name] 10/17/2005    Past Surgical History:  Procedure Laterality Date  . ABDOMINAL HYSTERECTOMY    . AORTIC VALVE REPLACEMENT  12/10/2013   porcine  . CHOLECYSTECTOMY  1981  . COLONOSCOPY  04/18/2006   Dr. Nelva Nay: internal hemorrhoids  . COLONOSCOPY  10/2003   Dr. Alphonsa Gin: hemorrhoids  . CORONARY ARTERY BYPASS GRAFT  2015  . ESOPHAGOGASTRODUODENOSCOPY  10/16/2013   Dr. Marla Roe: prior Nissen fundoplication intact, hypertonic LES, dilated up to 7 [place] with moderate resistance, gastritis but no H. pylori., Reactive gastritis, no celiac disease.  . ESOPHAGOGASTRODUODENOSCOPY  05/29/2012   Dr. Doy Mince: Moderately severe esophagitis, acute gastritis reactive, no H pylori, no celiac. Esophageal biopsies consistent with GERD, no Barrett  . ESOPHAGOGASTRODUODENOSCOPY  03/21/2009   Dr. Doy Mince: Reflux esophagitis, gastritis without H. pylori, esophagus stretched 57 savory  . ESOPHAGOGASTRODUODENOSCOPY  02/20/2008   Dr. Doy Mince: Esophagus dilated to 65 French, reactive gastropathy with no H pylori. No Barrett's on esophageal biopsy  .  ESOPHAGOGASTRODUODENOSCOPY  09/24/2006   Dr. Doy Mince: Tight wrap noted, reactive gastropathy, no Barrett's  . ESOPHAGOGASTRODUODENOSCOPY (EGD) WITH PROPOFOL N/A 01/09/2017   Procedure: ESOPHAGOGASTRODUODENOSCOPY (EGD) WITH PROPOFOL;  Surgeon: Daneil Dolin, MD;  Location: [facility] SU[facility];  Service: Endoscopy;  Laterality: N/A;  2:15PM  . FRACTURE SURGERY Left    wrist  . JOINT REPLACEMENT    . MALONEY DILATION N/A 01/09/2017   Procedure: Venia Minks DILATION;  Surgeon: Daneil Dolin, MD;  Location:  [facility];  Service: Endoscopy;  Laterality: N/A;  . MITRAL VALVE REPAIR  11/2013  . NISSEN FUNDOPLICATION  2706  . REPLACEMENT TOTAL KNEE BILATERAL  2010/2013     reports that she quit smoking about 50 years ago. Her smoking use included cigarettes. She has a 20.00 pack-year smoking history. She has never used smokeless tobacco. She reports that she does not drink alcohol or use drugs. Social History   Socioeconomic History  . Marital status: Widowed    Spouse name: Not on file  . Number of children: 3  . Years of education: Not on file  . Highest education level: Not on file  Occupational History  . Not on file  Social Needs  . Financial resource strain: Not on file  . Food insecurity:    Worry: Not on file    Inability: Not on file  . Transportation needs:    Medical: Not on file    Non-medical: Not on file  Tobacco Use  . Smoking status: Former Smoker    Packs/day: 1.00    Years: 20.00    Pack years: 20.00    Types: Cigarettes    Last attempt to quit: 09/12/1966    Years since quitting: 50.8  . Smokeless tobacco: Never Used  Substance and Sexual Activity  . Alcohol use: No  . Drug use: No  . Sexual activity: Never  Lifestyle  . Physical activity:    Days per week: Not on file    Minutes per session: Not on file  . Stress: Not on file  Relationships  . Social connections:    Talks on phone: Not on file    Gets together: Not on file    Attends religious service: Not on file    Active member of club or organization: Not on file    Attends meetings of clubs or organizations: Not on file    Relationship status: Not on file  . Intimate partner violence:    Fear of current or ex partner: Not on file    Emotionally abused: Not on file    Physically abused: Not on file    Forced sexual activity: Not on file  Other Topics Concern  . Not on file  Social History Narrative  . Not on file    Family History  Problem Relation Age of Onset  . Stroke Mother   .  Heart failure Mother   . Heart disease Mother   . Mitral valve prolapse Mother   . Diabetes Father   . Heart attack Father   . Stroke Father   . Heart disease Father   . Hypertension Father   . Alzheimer's disease Father   . Stroke Maternal Grandmother   . Arthritis Maternal Grandfather        hands  . Breast cancer Paternal Grandmother   . Osteoporosis Paternal Grandmother   . Breast cancer Cousin   . Colon cancer Neg Hx     Allergies  Allergen Reactions  . Penicillins  Anaphylaxis and Other (See Comments)    Has patient had a PCN reaction causing immediate rash, facial/tongue/throat swelling, SOB or lightheadedness with hypotension: Yes Has patient had a PCN reaction causing severe rash involving mucus membranes or skin necrosis: Yes Has patient had a PCN reaction that required hospitalization Yes Has patient had a PCN reaction occurring within the last 10 years: No If all of the above answers are "NO", then may proceed with Cephalosporin use.   . Latex Other (See Comments)    Redness and rash  . Morphine And Related Nausea Only and Other (See Comments)    Dizziness  . Tape Other (See Comments)    Redness and rash  . Iodine Other (See Comments)    Redness and rash    Outpatient Encounter Medications as of 07/05/2017  Medication Sig  . acetaminophen (TYLENOL) 650 MG CR tablet Take 650-1,300 mg by mouth every 8 (eight) hours as needed for pain.   Marland Kitchen aspirin EC 81 MG tablet Take 81 mg by mouth every morning.   Marland Kitchen atorvastatin (LIPITOR) 80 MG tablet TAKE 1 TABLET EVERY DAY  . Calcium Carb-Cholecalciferol (CALTRATE 600+D3 SOFT PO) Take 1 tablet by mouth 2 (two) times daily.   . cetirizine (ZYRTEC) 10 MG tablet Take 10 mg by mouth daily as needed for allergies.   . Cholecalciferol (VITAMIN D3) 2000 units TABS Take 2,000 Units by mouth daily.   . diphenhydrAMINE-zinc acetate (BENADRYL) cream Apply 1 application topically 3 (three) times daily as needed for itching.  . fluticasone  (FLONASE) 50 MCG/ACT nasal spray Place 2 sprays into both nostrils daily.  . furosemide (LASIX) 20 MG tablet Take 1 tablet (20 mg total) by mouth 2 (two) times daily.  Marland Kitchen gabapentin (NEURONTIN) 100 MG capsule TAKE 1 CAPSULE  EVERY MORNING AND TAKE 2 TO 3 CAPSULES  AT BEDTIME  . lisinopril (PRINIVIL,ZESTRIL) 5 MG tablet Take 5 mg by mouth daily.  Marland Kitchen loperamide (IMODIUM A-D) 2 MG tablet Take 2 mg by mouth as needed for diarrhea or loose stools.  . Melatonin 10 MG TABS Take 10 mg by mouth at bedtime.   . metFORMIN (GLUCOPHAGE) 500 MG tablet TAKE 1 TABLET TWICE DAILY WITH A MEAL AT 8AM AND 7PM  . metoprolol tartrate (LOPRESSOR) 25 MG tablet TAKE 1/2 TABLETS TWO TIMES DAILY AT 8 AM & 7 PM  . potassium chloride (K-DUR) 10 MEQ tablet Take 10 mEq by mouth 2 (two) times daily. Take with lasix  . warfarin (COUMADIN) 6 MG tablet TAKE 1 TABLET DAILY.   No facility-administered encounter medications on file as of 07/05/2017.     Review of Systems:  Review of Systems  Respiratory: Positive for cough, shortness of breath and wheezing.   Cardiovascular: Negative for chest pain.  All other systems reviewed and are negative.   Health Maintenance  Topic Date Due  . OPHTHALMOLOGY EXAM  10/10/2016  . PNA vac Low Risk Adult (2 of 2 - PPSV23) 10/31/2016  . FOOT EXAM  12/05/2016  . HEMOGLOBIN A1C  08/07/2017  . INFLUENZA VACCINE  10/24/2017  . MAMMOGRAM  11/22/2018  . COLONOSCOPY  03/26/2022  . TETANUS/TDAP  10/13/2026  . DEXA SCAN  Completed    Physical Exam: Vitals:   07/05/17 1144  BP: 124/76  Pulse: (!) 59  Temp: 99.1 F (37.3 C)  TempSrc: Oral  SpO2: 97%  Weight: 168 lb (76.2 kg)  Height: 5\' 2"  (1.575 m)   Body mass index is 30.73 kg/m. Physical Exam  Constitutional:  She is oriented to person, place, and time. She appears well-developed and well-nourished.  Looks ill in NAD, no conversational dyspnea  HENT:  Mouth/Throat: Oropharynx is clear and moist. No oropharyngeal exudate.  MMM;  no oral thrush; no sinus TTP  Eyes: Pupils are equal, round, and reactive to light. No scleral icterus.  Neck: Neck supple. Carotid bruit is not present. No tracheal deviation present.  Cardiovascular: Normal rate, regular rhythm and intact distal pulses. Exam reveals no gallop and no friction rub.  Murmur (2/6 SEM) heard. No LE edema b/l. no calf TTP.   Pulmonary/Chest: Effort normal. No stridor. No respiratory distress. She has wheezes (end expiratory with prolonged expiratory phase). She has no rales.  Abdominal: Soft. Normal appearance and bowel sounds are normal. She exhibits no distension and no mass. There is no hepatomegaly. There is tenderness (RUQ). There is no rigidity, no rebound and no guarding. No hernia.  Musculoskeletal: She exhibits edema.  Lymphadenopathy:    She has no cervical adenopathy.  Neurological: She is alert and oriented to person, place, and time.  Skin: Skin is warm and dry. No rash noted.  Psychiatric: She has a normal mood and affect. Her behavior is normal. Judgment and thought content normal.    Labs reviewed: Basic Metabolic Panel: Recent Labs    10/04/16 0958 01/03/17 0919 02/07/17 0913  NA 139 138 141  K 4.5 3.8 4.6  CL 103 102 101  CO2 28 29 32  GLUCOSE 128* 99 120*  BUN 20 24* 26*  CREATININE 0.85 1.13* 1.14*  CALCIUM 9.5 8.8* 9.8   Liver Function Tests: Recent Labs    10/04/16 0958  AST 19  ALT 17  ALKPHOS 99  BILITOT 0.5  PROT 6.8  ALBUMIN 4.0   No results for input(s): LIPASE, AMYLASE in the last 8760 hours. No results for input(s): AMMONIA in the last 8760 hours. CBC: Recent Labs    10/04/16 0958 01/03/17 0919  WBC 8.8 8.6  NEUTROABS 5,896 5.3  HGB 12.4 11.7*  HCT 37.8 36.5  MCV 85.9 89.0  PLT 394 316   Lipid Panel: Recent Labs    10/04/16 0958  CHOL 133  HDL 53  LDLCALC 57  TRIG 116  CHOLHDL 2.5   Lab Results  Component Value Date   HGBA1C 7.1 (H) 02/07/2017    Procedures since last visit: Dg Chest 2  View  Result Date: 06/18/2017 CLINICAL DATA:  Congestion for 1 week, history of aortic valve replacement and CHF EXAM: CHEST - 2 VIEW COMPARISON:  Chest x-ray of 05/02/2011 FINDINGS: No active infiltrate or effusion is seen. Mediastinal and hilar contours are unremarkable. Cardiomegaly is stable and aortic and mitral valvular replacements are noted. Median sternotomy sutures are noted. No bony abnormality is seen. IMPRESSION: 1. No active lung disease. 2. Stable mild cardiomegaly with aortic valve and mitral valve replacements. Electronically Signed   By: Ivar Drape M.D.   On: 06/18/2017 16:38    Assessment/Plan   ICD-10-CM   1. Acute bronchitis, unspecified organism J20.9 albuterol (PROVENTIL HFA;VENTOLIN HFA) 108 (90 Base) MCG/ACT inhaler    predniSONE (DELTASONE) 10 MG tablet    ipratropium-albuterol (DUONEB) 0.5-2.5 (3) MG/3ML nebulizer solution 3 mL    DISCONTINUED: ipratropium-albuterol (DUONEB) 0.5-2.5 (3) MG/3ML nebulizer solution 3 mL  2. Seasonal allergies J30.2   3. High risk medication use Z79.899   4. Type 2 diabetes mellitus without complication, without long-term current use of insulin (HCC) E11.9   5. History of tobacco abuse Z87.891  Please schedule follow up appt with coumadin clinic due to antibiotic and prednisone use  START ZPAK AS DIRECTED  START PREDNISONE TAPER AS DIRECTED (10MG  TABS TAKE 4 BY MOUTH X 2 DAYS --> 3 ---> 2 ---->1 AND STOP)  START INHALER TAPER (2 PUFFS 3 TIMES DAILY X 7 DAYS --> 2 TIMES DAILY ---> 1 TIME DAILY THEN STOP)  Take probiotic daily while on antibiotic   Continue other medications as ordered  Push fluids and rest  May continue cough drops and tylenol as needed  Take tessalon perles 3 times daily as needed for cough. Please swallow without liquid  Follow up as scheduled with Dr Mariea Clonts or sooner if need be   Cordella Register. Perlie Gold  [facility] [address] [place], [place]  [address] [phone] Cell (Monday-Friday 8 AM - 5 PM) [phone] After 5 PM and follow prompts

## 2017-07-09 ENCOUNTER — Ambulatory Visit (INDEPENDENT_AMBULATORY_CARE_PROVIDER_SITE_OTHER): Payer: Medicare Other | Admitting: *Deleted

## 2017-07-09 DIAGNOSIS — I4891 Unspecified atrial fibrillation: Secondary | ICD-10-CM

## 2017-07-09 DIAGNOSIS — Z8673 Personal history of transient ischemic attack (TIA), and cerebral infarction without residual deficits: Secondary | ICD-10-CM | POA: Diagnosis not present

## 2017-07-09 DIAGNOSIS — Z5181 Encounter for therapeutic drug level monitoring: Secondary | ICD-10-CM | POA: Diagnosis not present

## 2017-07-09 LAB — POCT INR: INR: 4

## 2017-07-09 NOTE — Patient Instructions (Signed)
Hold coumadin tomorrow, take 1/2 tablet Thursday then resume 1 tablet daily except 1/2 tablet on Wednesdays Took coumadin this morning Will finish prednisone taper 4/18 Will finish zpak 4/17 Recheck on 5/2

## 2017-07-24 ENCOUNTER — Other Ambulatory Visit: Payer: Medicare Other

## 2017-07-25 ENCOUNTER — Encounter: Payer: Self-pay | Admitting: Neurology

## 2017-07-25 ENCOUNTER — Ambulatory Visit (INDEPENDENT_AMBULATORY_CARE_PROVIDER_SITE_OTHER): Payer: Medicare Other | Admitting: Neurology

## 2017-07-25 ENCOUNTER — Ambulatory Visit (INDEPENDENT_AMBULATORY_CARE_PROVIDER_SITE_OTHER): Payer: Medicare Other | Admitting: *Deleted

## 2017-07-25 VITALS — BP 168/64 | HR 60 | Ht 62.0 in | Wt 168.0 lb

## 2017-07-25 DIAGNOSIS — G459 Transient cerebral ischemic attack, unspecified: Secondary | ICD-10-CM | POA: Diagnosis not present

## 2017-07-25 DIAGNOSIS — I10 Essential (primary) hypertension: Secondary | ICD-10-CM

## 2017-07-25 DIAGNOSIS — E119 Type 2 diabetes mellitus without complications: Secondary | ICD-10-CM

## 2017-07-25 DIAGNOSIS — I38 Endocarditis, valve unspecified: Secondary | ICD-10-CM

## 2017-07-25 DIAGNOSIS — I4891 Unspecified atrial fibrillation: Secondary | ICD-10-CM

## 2017-07-25 DIAGNOSIS — Z8673 Personal history of transient ischemic attack (TIA), and cerebral infarction without residual deficits: Secondary | ICD-10-CM

## 2017-07-25 DIAGNOSIS — Z5181 Encounter for therapeutic drug level monitoring: Secondary | ICD-10-CM

## 2017-07-25 DIAGNOSIS — I6521 Occlusion and stenosis of right carotid artery: Secondary | ICD-10-CM | POA: Diagnosis not present

## 2017-07-25 DIAGNOSIS — G451 Carotid artery syndrome (hemispheric): Secondary | ICD-10-CM

## 2017-07-25 DIAGNOSIS — E785 Hyperlipidemia, unspecified: Secondary | ICD-10-CM

## 2017-07-25 LAB — POCT INR: INR: 3.4

## 2017-07-25 NOTE — Patient Instructions (Addendum)
1.  We will check a carotid doppler.We have sent a referral to [facility] for the carotid doppler and they will call you directly to schedule your appt. If you need to contact them directly please call [phone]  2.  Continue aspirin and Coumadin  3.  Continue Lipitor   4.  Recheck blood pressure later and contact PCP if still elevated.  5.  Repeat carotid doppler again in one year and then follow up with me.

## 2017-07-25 NOTE — Progress Notes (Signed)
NEUROLOGY FOLLOW UP OFFICE NOTE  [name] [id number]  HISTORY OF PRESENT ILLNESS: Jeanette Yates is a 77 year old right-handed female with valvular heart disease status post porcine aortic valve repair and mitral valve repair with Medtronic 3-D ring, CAD status post CABG, hypertension, hyperlipidemia, former smoker and macular degeneration who follows up for left hemispheric TIA and right carotid artery stenosis.  She is accompanied by her daughter who supplements history.  UPDATE: She is doing well.  She has not had any recurrent TIAs.  She still has some short-term memory deficits, unchanged from last year.  She has upcoming labs.     HISTORY: While visiting family in [place], [place] in June 2018, she had an episode of sudden onset right sided numbness of the face, tongue, throat and fingers, lasting 10 to 20 minutes.  There was no associated unilateral weakness, slurred speech or facial droop (although her daughter reports th at her baseline left droopy eyelid seemed more pronounced for a few days afterwards).  For 24 hours prior to the event, she had a bifrontal/parietal 5/6 nonthrobbing headache.  She was brought to [facility] where she was worked up for TIA.  Blood pressure on arrival was reportedly elevated.  CT of head showed chronic encephalomalation in the right parietal region and right internal capsule, but no acute abnormality.  MRI of brain showed encephalomalacia in the right frontal and parietal lobes but no acute infarct or bleed.  Carotid doppler revealed 50-69% right ICA stenosis.  Echocardiogram demonstrated a LV EF of 65% with no cardiac source of emboli.  INR was 2.90 to 3.25, LDL 50, and Hgb A1c 6.5.  No changes were made to her medications.   Since then, she has been doing well.  She had a couple of those headaches again, but nothing severe.  Her blood pressure has been controlled.  She has not had any recurrent spells. She reports that she feels a little  off-balance, leaning towards the right.   She did have a similar TIA a year ago, presenting the same with right sided numbness, however she had some drooping of the left upper face (eyelid and cheek).   She takes ASA 81mg , Coumadin, Lipitor 80mg , Lopressor, Lasix and Diovan.   She has peripheral vision loss due to macular degeneration.  She has remote history of migraines.  PAST MEDICAL HISTORY: Past Medical History:  Diagnosis Date  . Adjustment disorder 10/17/2005  . Atrial fibrillation ([place])   . Cataract    bilateral  . CHF (congestive heart failure) ([place])   . Contracture of knee joint 07/26/2009  . Coronary artery disease   . Degeneration of lumbar or lumbosacral intervertebral disc 07/26/2009  . Diabetes mellitus without complication ([place])   . Esophageal spasm   . Esophageal spasm   . Fibromyalgia   . Fibromyalgia   . Hearing loss 12/18/2010  . Heart murmur   . Hypercholesteremia   . Hypertension   . 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 Leaky heart valve   . Lyme disease   . Macular degeneration   . Macular degeneration of both eyes   . Migraine 10/17/2005  . Mixed incontinence 10/17/2005  . Osteoarthritis    multiple joints   . Overweight 09/19/2010  . Pneumonia   . PONV (postoperative nausea and vomiting)   . Postartificial menopausal syndrome 10/10/2009  . Prinzmetal angina ([place]) 10/17/2005  . Psoriasis 06/12/2006  . Sciatica   . TIA (  transient ischemic attack)   . Type II diabetes mellitus ([place])   . Urolith 10/17/2005    MEDICATIONS: Current Outpatient Medications on File Prior to Visit  Medication Sig Dispense Refill  . acetaminophen (TYLENOL) 650 MG CR tablet Take 650-1,300 mg by mouth every 8 (eight) hours as needed for pain.     Marland Kitchen albuterol (PROVENTIL HFA;VENTOLIN HFA) 108 (90 Base) MCG/ACT inhaler 2 puffs TID x 7 days then BID x 7 days then daily x 7 days and stop for bronchitis 1 Inhaler 2  . aspirin EC 81 MG tablet Take  81 mg by mouth every morning.     Marland Kitchen atorvastatin (LIPITOR) 80 MG tablet TAKE 1 TABLET EVERY DAY 90 tablet 3  . azithromycin (ZITHROMAX) 250 MG tablet Take 2 tabs po today then 1 tab po daily x 4 days (Patient not taking: Reported on 07/25/2017) 6 tablet 0  . Calcium Carb-Cholecalciferol (CALTRATE 600+D3 SOFT PO) Take 1 tablet by mouth 2 (two) times daily.     . cetirizine (ZYRTEC) 10 MG tablet Take 10 mg by mouth daily as needed for allergies.     . Cholecalciferol (VITAMIN D3) 2000 units TABS Take 2,000 Units by mouth daily.     . diphenhydrAMINE-zinc acetate (BENADRYL) cream Apply 1 application topically 3 (three) times daily as needed for itching.    . fluticasone (FLONASE) 50 MCG/ACT nasal spray Place 2 sprays into both nostrils daily. 16 g 6  . furosemide (LASIX) 20 MG tablet Take 1 tablet (20 mg total) by mouth 2 (two) times daily. 10 tablet 0  . gabapentin (NEURONTIN) 100 MG capsule TAKE 1 CAPSULE  EVERY MORNING AND TAKE 2 TO 3 CAPSULES  AT BEDTIME 360 capsule 1  . lisinopril (PRINIVIL,ZESTRIL) 5 MG tablet Take 5 mg by mouth daily.    Marland Kitchen loperamide (IMODIUM A-D) 2 MG tablet Take 2 mg by mouth as needed for diarrhea or loose stools.    . Melatonin 10 MG TABS Take 10 mg by mouth at bedtime.     . metFORMIN (GLUCOPHAGE) 500 MG tablet TAKE 1 TABLET TWICE DAILY WITH A MEAL AT 8AM AND 7PM 180 tablet 3  . metoprolol tartrate (LOPRESSOR) 25 MG tablet TAKE 1/2 TABLETS TWO TIMES DAILY AT 8 AM & 7 PM 90 tablet 0  . potassium chloride (K-DUR) 10 MEQ tablet Take 10 mEq by mouth 2 (two) times daily. Take with lasix    . predniSONE (DELTASONE) 10 MG tablet Take 4 tabs po daily x 2 days then 3 tabs daily x 2 days then 2 tabs daily x 2 days then 1 tab daily x 2 days and stop (Patient not taking: Reported on 07/25/2017) 20 tablet 0  . warfarin (COUMADIN) 6 MG tablet TAKE 1 TABLET DAILY. 90 tablet 3   No current facility-administered medications on file prior to visit.     ALLERGIES: Allergies  Allergen  Reactions  . Penicillins Anaphylaxis and Other (See Comments)    Has patient had a PCN reaction causing immediate rash, facial/tongue/throat swelling, SOB or lightheadedness with hypotension: Yes Has patient had a PCN reaction causing severe rash involving mucus membranes or skin necrosis: Yes Has patient had a PCN reaction that required hospitalization Yes Has patient had a PCN reaction occurring within the last 10 years: No If all of the above answers are "NO", then may proceed with Cephalosporin use.   . Latex Other (See Comments)    Redness and rash  . Morphine And Related Nausea Only and Other (  See Comments)    Dizziness  . Tape Other (See Comments)    Redness and rash  . Iodine Other (See Comments)    Redness and rash    FAMILY HISTORY: Family History  Problem Relation Age of Onset  . Stroke Mother   . Heart failure Mother   . Heart disease Mother   . Mitral valve prolapse Mother   . Diabetes Father   . Heart attack Father   . Stroke Father   . Heart disease Father   . Hypertension Father   . Alzheimer's disease Father   . Stroke Maternal Grandmother   . Arthritis Maternal Grandfather        hands  . Breast cancer Paternal Grandmother   . Osteoporosis Paternal Grandmother   . Breast cancer Cousin   . Colon cancer Neg Hx     SOCIAL HISTORY: Social History   Socioeconomic History  . Marital status: Widowed    Spouse name: Not on file  . Number of children: 3  . Years of education: Not on file  . Highest education level: Not on file  Occupational History  . Not on file  Social Needs  . Financial resource strain: Not on file  . Food insecurity:    Worry: Not on file    Inability: Not on file  . Transportation needs:    Medical: Not on file    Non-medical: Not on file  Tobacco Use  . Smoking status: Former Smoker    Packs/day: 1.00    Years: 20.00    Pack years: 20.00    Types: Cigarettes    Last attempt to quit: 09/12/1966    Years since quitting:  50.9  . Smokeless tobacco: Never Used  Substance and Sexual Activity  . Alcohol use: No  . Drug use: No  . Sexual activity: Never  Lifestyle  . Physical activity:    Days per week: Not on file    Minutes per session: Not on file  . Stress: Not on file  Relationships  . Social connections:    Talks on phone: Not on file    Gets together: Not on file    Attends religious service: Not on file    Active member of club or organization: Not on file    Attends meetings of clubs or organizations: Not on file    Relationship status: Not on file  . Intimate partner violence:    Fear of current or ex partner: Not on file    Emotionally abused: Not on file    Physically abused: Not on file    Forced sexual activity: Not on file  Other Topics Concern  . Not on file  Social History Narrative  . Not on file    REVIEW OF SYSTEMS: Constitutional: No fevers, chills, or sweats, no generalized fatigue, change in appetite Eyes: No visual changes, double vision, eye pain Ear, nose and throat: No hearing loss, ear pain, nasal congestion, sore throat Cardiovascular: No chest pain, palpitations Respiratory:  No shortness of breath at rest or with exertion, wheezes GastrointestinaI: No nausea, vomiting, diarrhea, abdominal pain, fecal incontinence Genitourinary:  No dysuria, urinary retention or frequency Musculoskeletal:  No neck pain, back pain Integumentary: No rash, pruritus, skin lesions Neurological: as above Psychiatric: No depression, insomnia, anxiety Endocrine: No palpitations, fatigue, diaphoresis, mood swings, change in appetite, change in weight, increased thirst Hematologic/Lymphatic:  No purpura, petechiae. Allergic/Immunologic: no itchy/runny eyes, nasal congestion, recent allergic reactions, rashes  PHYSICAL EXAM: Vitals:  07/25/17 1428  BP: (!) 168/64  Pulse: 60  SpO2: 97%   General: No acute distress.  Patient appears well-groomed.   Head:   Normocephalic/atraumatic Eyes:  Fundi examined but not visualized Neck: supple, no paraspinal tenderness, full range of motion Heart:  Regular rate and rhythm Lungs:  Clear to auscultation bilaterally Back: No paraspinal tenderness Neurological Exam: alert and oriented to person, place, and time. Attention span and concentration intact, recent and remote memory intact, fund of knowledge intact.  Speech fluent and not dysarthric, language intact.  Peripheral vision loss.  Otherwise, CN II-XII intact. Bulk and tone normal, muscle strength 5/5 throughout.  Sensation to light touch  intact.  Deep tendon reflexes 2+ throughout.  Finger to nose testing intact.  Gait normal, Romberg negative.  IMPRESSION: 1.  TIA, left hemispheric.  Similar TIA presentation a year ago.  With associated headache and history of migraines, consider atypical migraine as well. 2.  Asymptomatic right sided carotid artery stenosis with incidental remote right frontal stroke (not clinical). 3.  Hyperlipidemia, adequately treated 4.  Hypertension, elevated today.  Usually controlled.  She forgot to take her medication this morning and only took it on the way to this appointment. 5.  Type 2 diabetes mellitus 6.  Atrial fibrillation  PLAN: 1.  Continue Coumadin and ASA 81mg  daily  2.  Continue atorvastatin 80mg  daily.  LDL goal less than 70 3.  Recheck blood pressure at home.  If still elevated, recommended to contact PCP's office. 4. Glycemic control 5.  Repeat carotid doppler ASAP and again in one year prior to follow up. 6.  Follow up in one year.  17 minutes spent face to face with patient, over 50% spent discussing management.  Metta Clines, DO  CC:  Hollace Kinnier, DO

## 2017-07-25 NOTE — Patient Instructions (Signed)
Hold coumadin tonight then decrease dose to 1 tablet daily except 1/2 tablet on Sundays and Wednesdays Took coumadin 1 tablet last night because she forgot the 1/2 tablet yesterday morning Recheck in 3 weeks

## 2017-07-29 ENCOUNTER — Ambulatory Visit: Payer: Medicare Other | Admitting: Gastroenterology

## 2017-07-30 ENCOUNTER — Ambulatory Visit: Payer: Medicare Other | Admitting: Gastroenterology

## 2017-07-31 ENCOUNTER — Encounter: Payer: Self-pay | Admitting: Cardiology

## 2017-08-01 ENCOUNTER — Ambulatory Visit: Payer: Medicare Other | Admitting: Internal Medicine

## 2017-08-08 ENCOUNTER — Other Ambulatory Visit: Payer: Medicare Other

## 2017-08-09 ENCOUNTER — Ambulatory Visit: Payer: Medicare Other | Admitting: Cardiology

## 2017-08-12 ENCOUNTER — Ambulatory Visit: Payer: Medicare Other | Admitting: Internal Medicine

## 2017-08-15 ENCOUNTER — Ambulatory Visit (INDEPENDENT_AMBULATORY_CARE_PROVIDER_SITE_OTHER): Payer: Medicare Other | Admitting: *Deleted

## 2017-08-15 DIAGNOSIS — Z8673 Personal history of transient ischemic attack (TIA), and cerebral infarction without residual deficits: Secondary | ICD-10-CM

## 2017-08-15 DIAGNOSIS — Z5181 Encounter for therapeutic drug level monitoring: Secondary | ICD-10-CM | POA: Diagnosis not present

## 2017-08-15 DIAGNOSIS — I4891 Unspecified atrial fibrillation: Secondary | ICD-10-CM

## 2017-08-15 LAB — POCT INR: INR: 2 (ref 2.0–3.0)

## 2017-08-15 NOTE — Patient Instructions (Signed)
Continue coumadin 1 tablet daily except 1/2 tablet on Sundays and Wednesdays  Recheck in 4 weeks 

## 2017-08-26 ENCOUNTER — Encounter: Payer: Self-pay | Admitting: Cardiology

## 2017-08-26 ENCOUNTER — Other Ambulatory Visit: Payer: Self-pay | Admitting: Internal Medicine

## 2017-08-26 ENCOUNTER — Ambulatory Visit (INDEPENDENT_AMBULATORY_CARE_PROVIDER_SITE_OTHER): Payer: Medicare Other | Admitting: Cardiology

## 2017-08-26 VITALS — BP 140/80 | HR 64 | Ht 62.0 in | Wt 168.0 lb

## 2017-08-26 DIAGNOSIS — E782 Mixed hyperlipidemia: Secondary | ICD-10-CM | POA: Diagnosis not present

## 2017-08-26 DIAGNOSIS — I6521 Occlusion and stenosis of right carotid artery: Secondary | ICD-10-CM | POA: Diagnosis not present

## 2017-08-26 DIAGNOSIS — I480 Paroxysmal atrial fibrillation: Secondary | ICD-10-CM | POA: Diagnosis not present

## 2017-08-26 DIAGNOSIS — I10 Essential (primary) hypertension: Secondary | ICD-10-CM

## 2017-08-26 DIAGNOSIS — I38 Endocarditis, valve unspecified: Secondary | ICD-10-CM | POA: Diagnosis not present

## 2017-08-26 MED ORDER — LISINOPRIL 10 MG PO TABS: 10.0000 mg | ORAL_TABLET | Freq: Every day | ORAL | 3 refills | Status: DC

## 2017-08-26 NOTE — Progress Notes (Addendum)
Clinical Summary Ms. Stubblefield is a 77 y.o.female seen today for follow up of the following medical problems.    1. Valvular heart disease - echo 10/2013 severe MR, moderate AI - TEE 11/2013 LVEF 25-30%, severe MR due to MV anular dilatation, moderate AI - 11/2013 s/p 23 mm Hancock II porcine valve AVR, MV repair with Medtronic 3D ring. Postop complicated by pleural effusion, afib - echo 04/2015 mild MR, normally functioning AVR. LVEF 50-55% - 08/2016 echo LVEF 65%, normal AVR, no significant MR.      - no recent edema. On hot some SOB.  - [place] a [place] troubles.  - compliant with meds  2. HTN - does not check at home - compliant with meds.  3. Hyperlipidemia - 09/2016 TC 133 TG 116 HDL 53 LDL 57 - compliant with statin.   4. CAD - she reports prior MI - CABG 11/2013 SVG-RCA at time of valve surgery - no recent symptoms.   5. DM2 - followed by pcp  6. History of chronic systolic HF, now with normalized LVEF - echo 10/2013 LVEF 30-35%, severe MR - cath 11/2013 without signifaicant CAD - 04/2015 echo LVEF 50-55%  - compliant with meds - no recent SOB or DOE, no LE edema   7. PAF - no recent symptoms.    8. TIA - recent TIA 09/05/16 while in [place]. INR was 2.9 at the time.  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 Adjustment disorder 10/17/2005  . Atrial fibrillation ([place])   . Cataract    bilateral  . CHF (congestive heart failure) ([place])   . Contracture of knee joint 07/26/2009  . Coronary artery disease   . Degeneration of lumbar or lumbosacral intervertebral disc 07/26/2009  . Diabetes mellitus without complication ([place])   . Esophageal spasm   . Esophageal spasm   . Fibromyalgia   . Fibromyalgia   . Hearing loss 12/18/2010  . Heart murmur   .  Hypercholesteremia   . Hypertension   . 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 Leaky heart valve   . Lyme disease   . Macular degeneration   . Macular degeneration of both eyes   . Migraine 10/17/2005  . Mixed incontinence 10/17/2005  . Osteoarthritis    multiple joints   . Overweight 09/19/2010  . Pneumonia   . PONV (postoperative nausea and vomiting)   . Postartificial menopausal syndrome 10/10/2009  . Prinzmetal angina ([place]) 10/17/2005  . Psoriasis 06/12/2006  . Sciatica   . TIA (transient ischemic attack)   . Type II diabetes mellitus ([place])   . Urolith 10/17/2005     Allergies  Allergen Reactions  . Penicillins Anaphylaxis and Other (See Comments)    Has patient had a PCN reaction causing immediate rash, facial/tongue/throat swelling, SOB or lightheadedness with hypotension: Yes Has patient had a PCN reaction causing severe rash involving mucus membranes or skin necrosis: Yes Has patient had a PCN reaction that required hospitalization Yes Has patient had a PCN reaction occurring within the last 10 years: No If all of the above answers are "NO", then may proceed with Cephalosporin use.   . Latex Other (See Comments)    Redness and rash  . Morphine And Related Nausea Only and  Other (See Comments)    Dizziness  . Tape Other (See Comments)    Redness and rash  . Iodine Other (See Comments)    Redness and rash     Current Outpatient Medications  Medication Sig Dispense Refill  . acetaminophen (TYLENOL) 650 MG CR tablet Take 650-1,300 mg by mouth every 8 (eight) hours as needed for pain.     Marland Kitchen aspirin EC 81 MG tablet Take 81 mg by mouth every morning.     Marland Kitchen atorvastatin (LIPITOR) 80 MG tablet TAKE 1 TABLET EVERY DAY 90 tablet 3  . Calcium Carb-Cholecalciferol (CALTRATE 600+D3 SOFT PO) Take 1 tablet by mouth 2 (two) times daily.     . cetirizine (ZYRTEC) 10 MG tablet Take 10 mg by mouth daily as needed for allergies.     . Cholecalciferol  (VITAMIN D3) 2000 units TABS Take 2,000 Units by mouth daily.     . diphenhydrAMINE-zinc acetate (BENADRYL) cream Apply 1 application topically 3 (three) times daily as needed for itching.    . fluticasone (FLONASE) 50 MCG/ACT nasal spray Place 2 sprays into both nostrils daily. 16 g 6  . furosemide (LASIX) 20 MG tablet TAKE 1 TABLET (20 MG TOTAL) BY MOUTH EVERY MORNING AT 8 AM 90 tablet 1  . gabapentin (NEURONTIN) 100 MG capsule TAKE 1 CAPSULE  EVERY MORNING AND TAKE 2 TO 3 CAPSULES  AT BEDTIME 360 capsule 1  . lisinopril (PRINIVIL,ZESTRIL) 5 MG tablet Take 5 mg by mouth daily.    Marland Kitchen loperamide (IMODIUM A-D) 2 MG tablet Take 2 mg by mouth as needed for diarrhea or loose stools.    . Melatonin 10 MG TABS Take 10 mg by mouth at bedtime.     . metFORMIN (GLUCOPHAGE) 500 MG tablet TAKE 1 TABLET TWICE DAILY WITH A MEAL AT 8AM AND 7PM 180 tablet 3  . metoprolol tartrate (LOPRESSOR) 25 MG tablet TAKE 1/2 TABLETS TWO TIMES DAILY AT 8 AM & 7 PM 90 tablet 0  . potassium chloride (K-DUR) 10 MEQ tablet Take 10 mEq by mouth 2 (two) times daily. Take with lasix    . potassium chloride (K-DUR,KLOR-CON) 10 MEQ tablet TAKE 1 TABLET (10 MEQ TOTAL) BY MOUTH EVERY MORNING AT  8 AM 90 tablet 1  . warfarin (COUMADIN) 6 MG tablet TAKE 1 TABLET DAILY. 90 tablet 3   No current facility-administered medications for this visit.      Past Surgical History:  Procedure Laterality Date  . ABDOMINAL HYSTERECTOMY    . AORTIC VALVE REPLACEMENT  12/10/2013   porcine  . CHOLECYSTECTOMY  1981  . COLONOSCOPY  04/18/2006   Dr. Nelva Nay: internal hemorrhoids  . COLONOSCOPY  10/2003   Dr. Alphonsa Gin: hemorrhoids  . CORONARY ARTERY BYPASS GRAFT  2015  . ESOPHAGOGASTRODUODENOSCOPY  10/16/2013   Dr. Marla Roe: prior Nissen fundoplication intact, hypertonic LES, dilated up to 86 [place] with moderate resistance, gastritis but no H. pylori., Reactive gastritis, no celiac disease.  . ESOPHAGOGASTRODUODENOSCOPY  05/29/2012   Dr.  Doy Mince: Moderately severe esophagitis, acute gastritis reactive, no H pylori, no celiac. Esophageal biopsies consistent with GERD, no Barrett  . ESOPHAGOGASTRODUODENOSCOPY  03/21/2009   Dr. Doy Mince: Reflux esophagitis, gastritis without H. pylori, esophagus stretched 57 savory  . ESOPHAGOGASTRODUODENOSCOPY  02/20/2008   Dr. Doy Mince: Esophagus dilated to 73 [place], reactive gastropathy with no H pylori. No Barrett's on esophageal biopsy  . ESOPHAGOGASTRODUODENOSCOPY  09/24/2006   Dr. Doy Mince: Tight wrap noted, reactive gastropathy, no Barrett's  . ESOPHAGOGASTRODUODENOSCOPY (EGD)  WITH PROPOFOL N/A 01/09/2017   Procedure: ESOPHAGOGASTRODUODENOSCOPY (EGD) WITH PROPOFOL;  Surgeon: Daneil Dolin, MD;  Location: [facility];  Service: Endoscopy;  Laterality: N/A;  2:15PM  . FRACTURE SURGERY Left    wrist  . JOINT REPLACEMENT    . MALONEY DILATION N/A 01/09/2017   Procedure: Venia Minks DILATION;  Surgeon: Daneil Dolin, MD;  Location: [facility];  Service: Endoscopy;  Laterality: N/A;  . MITRAL VALVE REPAIR  11/2013  . NISSEN FUNDOPLICATION  2993  . REPLACEMENT TOTAL KNEE BILATERAL  2010/2013     Allergies  Allergen Reactions  . Penicillins Anaphylaxis and Other (See Comments)    Has patient had a PCN reaction causing immediate rash, facial/tongue/throat swelling, SOB or lightheadedness with hypotension: Yes Has patient had a PCN reaction causing severe rash involving mucus membranes or skin necrosis: Yes Has patient had a PCN reaction that required hospitalization Yes Has patient had a PCN reaction occurring within the last 10 years: No If all of the above answers are "NO", then may proceed with Cephalosporin use.   . Latex Other (See Comments)    Redness and rash  . Morphine And Related Nausea Only and Other (See Comments)    Dizziness  . Tape Other (See Comments)    Redness and rash  . Iodine Other (See Comments)    Redness and rash      Family History  Problem  Relation Age of Onset  . Stroke Mother   . Heart failure Mother   . Heart disease Mother   . Mitral valve prolapse Mother   . Diabetes Father   . Heart attack Father   . Stroke Father   . Heart disease Father   . Hypertension Father   . Alzheimer's disease Father   . Stroke Maternal Grandmother   . Arthritis Maternal Grandfather        hands  . Breast cancer Paternal Grandmother   . Osteoporosis Paternal Grandmother   . Breast cancer Cousin   . Colon cancer Neg Hx      Social History Ms. Jaffee reports that she quit smoking about 50 years ago. Her smoking use included cigarettes. She has a 20.00 pack-year smoking history. She has never used smokeless tobacco. Ms. Cregan reports that she does not drink alcohol.   Review of Systems CONSTITUTIONAL: No weight loss, fever, chills, weakness or fatigue.  HEENT: Eyes: No visual loss, blurred vision, double vision or yellow sclerae.No hearing loss, sneezing, congestion, runny nose or sore throat.  SKIN: No rash or itching.  CARDIOVASCULAR: per hpi RESPIRATORY: No shortness of breath, cough or sputum.  GASTROINTESTINAL: No anorexia, nausea, vomiting or diarrhea. No abdominal pain or blood.  GENITOURINARY: No burning on urination, no polyuria NEUROLOGICAL: No headache, dizziness, syncope, paralysis, ataxia, numbness or tingling in the extremities. No change in bowel or bladder control.  MUSCULOSKELETAL: No muscle, back pain, joint pain or stiffness.  LYMPHATICS: No enlarged nodes. No history of splenectomy.  PSYCHIATRIC: No history of depression or anxiety.  ENDOCRINOLOGIC: No reports of sweating, cold or heat intolerance. No polyuria or polydipsia.  Marland Kitchen   Physical Examination Vitals:   08/26/17 1531  BP: 140/80  Pulse: 64  SpO2: 97%   Vitals:   08/26/17 1531  Weight: 168 lb (76.2 kg)  Height: 5\' 2"  (1.575 m)    Gen: resting comfortably, no acute distress HEENT: no scleral icterus, pupils equal round and reactive, no  palptable cervical adenopathy,  CV: RRR, no m/r/g, no jvd  Resp: Clear to auscultation bilaterally GI: abdomen is soft, non-tender, non-distended, normal bowel sounds, no hepatosplenomegaly MSK: extremities are warm, no edema.  Skin: warm, no rash Neuro:  no focal deficits Psych: appropriate affect   Diagnostic Studies 04/2015 echo Study Conclusions  - Left ventricle: The cavity size was normal. Wall thickness was  increased in a pattern of mild LVH. Systolic function was low  normal. The estimated ejection fraction was in the range of 50%  to 55%. Wall motion was normal; there were no regional wall  motion abnormalities. The study is not technically sufficient to  allow evaluation of LV diastolic function. - Aortic valve: Normally functioning bioprosthetic aortic valve  noted. There was no stenosis. There was no regurgitation. Peak  velocity (S): 282 cm/s. Mean gradient (S): 17 mm Hg. - Aorta: Mild ascending aortic dilatation. Maximal diameter 3.73  cm. - Mitral valve: S/p mitral valve repair. There was mild  regurgitation. Valve area by pressure half-time: 1.39 cm^2. - Left atrium: The atrium was moderately to severely dilated. - Right ventricle: Systolic function was mildly reduced. - Tricuspid valve: There was mild regurgitation. - Pulmonary arteries: Systolic pressure was mildly increased. PA  peak pressure: 33 mm Hg (S).  04/2015 Carotid US IMPRESSION: 1. Moderate right carotid bifurcation atherosclerotic vascular disease. Visually degree of stenosis in the 50-69% range. Elevation of flow velocity ratios also present.  2. Mild left carotid bifurcation atherosclerotic vascular disease. Degree of stenosis less than 50%.  3. Vertebral arteries are patent with antegrade flow.    Assessment and Plan   1. Valvular heart disease - s/p tissue AVR and MV ring 11/2013 - echo 04/2015 with normal valve function - no symptoms, continue current meds  2. HTN -  bp above goal of 130/80 or less, increase lisinopril to 10mg  daily, check BMET in 3 weeks.   3. Hyperlipidemia - she will continue statin  4. Chronic systolic HF - LVEF has normalized s/p valve surgery and CABG - no symptoms, continue to monitor.   5. PAF - . CHADS2Vasc score of 7, continue coumadin. She has also been on ASA due to prior recurrent TIAs - she will continue current meds, no symptoms - EKG today in clinic shows SR.   F/u 46months   Arnoldo Lenis, M.D.

## 2017-08-26 NOTE — Patient Instructions (Addendum)
Medication Instructions:   Your physician has recommended you make the following change in your medication:   Increase lisinopril to 10 mg by mouth daily. You may take (2) of your 5 mg tablets daily until they are finished.  Continue all other medications the same.  Labwork:  Your physician recommends that you return for lab work in: 3 weeks to check your BMET.  Testing/Procedures:  NONE  Follow-Up:  Your physician recommends that you schedule a follow-up appointment in: 6 months. You will receive a reminder letter in the mail in about 4 months reminding you to call and schedule your appointment. If you don't receive this letter, please contact our office.  Any Other Special Instructions Will Be Listed Below (If Applicable).  Your physician recommends that you follow a DASH Diet. Please use the information given to you today as a guide.  If you need a refill on your cardiac medications before your next appointment, please call your pharmacy. DASH Eating Plan DASH stands for "Dietary Approaches to Stop Hypertension." The DASH eating plan is a healthy eating plan that has been shown to reduce high blood pressure (hypertension). It may also reduce your risk for type 2 diabetes, heart disease, and stroke. The DASH eating plan may also help with weight loss. What are tips for following this plan? General guidelines  Avoid eating more than 2,300 mg (milligrams) of salt (sodium) a day. If you have hypertension, you may need to reduce your sodium intake to 1,500 mg a day.  Limit alcohol intake to no more than 1 drink a day for nonpregnant women and 2 drinks a day for men. One drink equals 12 oz of beer, 5 oz of wine, or 1 oz of hard liquor.  Work with your health care provider to maintain a healthy body weight or to lose weight. Ask what an ideal weight is for you.  Get at least 30 minutes of exercise that causes your heart to beat faster (aerobic exercise) most days of the week.  Activities may include walking, swimming, or biking.  Work with your health care provider or diet and nutrition specialist (dietitian) to adjust your eating plan to your individual calorie needs. Reading food labels  Check food labels for the amount of sodium per serving. Choose foods with less than 5 percent of the Daily Value of sodium. Generally, foods with less than 300 mg of sodium per serving fit into this eating plan.  To find whole grains, look for the word "whole" as the first word in the ingredient list. Shopping  Buy products labeled as "low-sodium" or "no salt added."  Buy fresh foods. Avoid canned foods and premade or frozen meals. Cooking  Avoid adding salt when cooking. Use salt-free seasonings or herbs instead of table salt or sea salt. Check with your health care provider or pharmacist before using salt substitutes.  Do not fry foods. Cook foods using healthy methods such as baking, boiling, grilling, and broiling instead.  Cook with heart-healthy oils, such as olive, canola, soybean, or sunflower oil. Meal planning   Eat a balanced diet that includes: ? 5 or more servings of fruits and vegetables each day. At each meal, try to fill half of your plate with fruits and vegetables. ? Up to 6-8 servings of whole grains each day. ? Less than 6 oz of lean meat, poultry, or fish each day. A 3-oz serving of meat is about the same size as a deck of cards. One egg equals 1 oz. ?  2 servings of low-fat dairy each day. ? A serving of nuts, seeds, or beans 5 times each week. ? Heart-healthy fats. Healthy fats called Omega-3 fatty acids are found in foods such as flaxseeds and coldwater fish, like sardines, salmon, and mackerel.  Limit how much you eat of the following: ? Canned or prepackaged foods. ? Food that is high in trans fat, such as fried foods. ? Food that is high in saturated fat, such as fatty meat. ? Sweets, desserts, sugary drinks, and other foods with added  sugar. ? Full-fat dairy products.  Do not salt foods before eating.  Try to eat at least 2 vegetarian meals each week.  Eat more home-cooked food and less restaurant, buffet, and fast food.  When eating at a restaurant, ask that your food be prepared with less salt or no salt, if possible. What foods are recommended? The items listed may not be a complete list. Talk with your dietitian about what dietary choices are best for you. Grains Whole-grain or whole-wheat bread. Whole-grain or whole-wheat pasta. Brown rice. [place]. Bulgur. Whole-grain and low-sodium cereals. Pita bread. Low-fat, low-sodium crackers. Whole-wheat flour tortillas. Vegetables Fresh or frozen vegetables (raw, steamed, roasted, or grilled). Low-sodium or reduced-sodium tomato and vegetable juice. Low-sodium or reduced-sodium tomato sauce and tomato paste. Low-sodium or reduced-sodium canned vegetables. Fruits All fresh, dried, or frozen fruit. Canned fruit in natural juice (without added sugar). Meat and other protein foods Skinless chicken or [place]. Ground chicken or [place]. Pork with fat trimmed off. Fish and seafood. Egg whites. Dried beans, peas, or lentils. Unsalted nuts, nut butters, and seeds. Unsalted canned beans. Lean cuts of beef with fat trimmed off. Low-sodium, lean deli meat. Dairy Low-fat (1%) or fat-free (skim) milk. Fat-free, low-fat, or reduced-fat cheeses. Nonfat, low-sodium ricotta or cottage cheese. Low-fat or nonfat yogurt. Low-fat, low-sodium cheese. Fats and oils Soft margarine without trans fats. Vegetable oil. Low-fat, reduced-fat, or light mayonnaise and salad dressings (reduced-sodium). Canola, safflower, olive, soybean, and sunflower oils. Avocado. Seasoning and other foods Herbs. Spices. Seasoning mixes without salt. Unsalted popcorn and pretzels. Fat-free sweets. What foods are not recommended? The items listed may not be a complete list. Talk with your dietitian about what  dietary choices are best for you. Grains Baked goods made with fat, such as croissants, muffins, or some breads. Dry pasta or rice meal packs. Vegetables Creamed or fried vegetables. Vegetables in a cheese sauce. Regular canned vegetables (not low-sodium or reduced-sodium). Regular canned tomato sauce and paste (not low-sodium or reduced-sodium). Regular tomato and vegetable juice (not low-sodium or reduced-sodium). Angie Fava. Olives. Fruits Canned fruit in a light or heavy syrup. Fried fruit. Fruit in cream or butter sauce. Meat and other protein foods Fatty cuts of meat. Ribs. Fried meat. Berniece Salines. Sausage. Bologna and other processed lunch meats. Salami. Fatback. Hotdogs. Bratwurst. Salted nuts and seeds. Canned beans with added salt. Canned or smoked fish. Whole eggs or egg yolks. Chicken or [place] with skin. Dairy Whole or 2% milk, cream, and half-and-half. Whole or full-fat cream cheese. Whole-fat or sweetened yogurt. Full-fat cheese. Nondairy creamers. Whipped toppings. Processed cheese and cheese spreads. Fats and oils Butter. Stick margarine. Lard. Shortening. Ghee. Bacon fat. Tropical oils, such as coconut, palm kernel, or palm oil. Seasoning and other foods Salted popcorn and pretzels. Onion salt, garlic salt, seasoned salt, table salt, and sea salt. Worcestershire sauce. Tartar sauce. Barbecue sauce. Teriyaki sauce. Soy sauce, including reduced-sodium. Steak sauce. Canned and packaged gravies. Fish sauce. Oyster sauce. Cocktail sauce.  Horseradish that you find on the shelf. Ketchup. Mustard. Meat flavorings and tenderizers. Bouillon cubes. Hot sauce and Tabasco sauce. Premade or packaged marinades. Premade or packaged taco seasonings. Relishes. Regular salad dressings. Where to find more information:  National Heart, Lung, and [place]: https://wilson-eaton.com/  American Heart Association: www.heart.org Summary  The DASH eating plan is a healthy eating plan that has been shown to reduce  high blood pressure (hypertension). It may also reduce your risk for type 2 diabetes, heart disease, and stroke.  With the DASH eating plan, you should limit salt (sodium) intake to 2,300 mg a day. If you have hypertension, you may need to reduce your sodium intake to 1,500 mg a day.  When on the DASH eating plan, aim to eat more fresh fruits and vegetables, whole grains, lean proteins, low-fat dairy, and heart-healthy fats.  Work with your health care provider or diet and nutrition specialist (dietitian) to adjust your eating plan to your individual calorie needs. This information is not intended to replace advice given to you by your health care provider. Make sure you discuss any questions you have with your health care provider. Document Released: 03/01/2011 Document Revised: 03/05/2016 Document Reviewed: 03/05/2016 Elsevier Interactive Patient Education  [employer].

## 2017-08-31 ENCOUNTER — Encounter: Payer: Self-pay | Admitting: Cardiology

## 2017-09-03 ENCOUNTER — Other Ambulatory Visit: Payer: Medicare Other

## 2017-09-03 DIAGNOSIS — J069 Acute upper respiratory infection, unspecified: Secondary | ICD-10-CM | POA: Diagnosis not present

## 2017-09-03 DIAGNOSIS — I10 Essential (primary) hypertension: Secondary | ICD-10-CM | POA: Diagnosis not present

## 2017-09-03 DIAGNOSIS — E119 Type 2 diabetes mellitus without complications: Secondary | ICD-10-CM | POA: Diagnosis not present

## 2017-09-04 LAB — CBC WITH DIFFERENTIAL/PLATELET
Basophils Absolute: 108 cells/uL (ref 0–200)
Basophils Relative: 1.4 %
Eosinophils Absolute: 531 cells/uL — ABNORMAL HIGH (ref 15–500)
Eosinophils Relative: 6.9 %
HCT: 36.3 % (ref 35.0–45.0)
Hemoglobin: 12.4 g/dL (ref 11.7–15.5)
Lymphs Abs: 1763 cells/uL (ref 850–3900)
MCH: 29.3 pg (ref 27.0–33.0)
MCHC: 34.2 g/dL (ref 32.0–36.0)
MCV: 85.8 fL (ref 80.0–100.0)
MPV: 10.1 fL (ref 7.5–12.5)
Monocytes Relative: 6.9 %
Neutro Abs: 4766 cells/uL (ref 1500–7800)
Neutrophils Relative %: 61.9 %
Platelets: 351 10*3/uL (ref 140–400)
RBC: 4.23 10*6/uL (ref 3.80–5.10)
RDW: 12.8 % (ref 11.0–15.0)
Total Lymphocyte: 22.9 %
WBC mixed population: 531 cells/uL (ref 200–950)
WBC: 7.7 10*3/uL (ref 3.8–10.8)

## 2017-09-04 LAB — BASIC METABOLIC PANEL
BUN: 24 mg/dL (ref 7–25)
CO2: 30 mmol/L (ref 20–32)
Calcium: 9.3 mg/dL (ref 8.6–10.4)
Chloride: 104 mmol/L (ref 98–110)
Creat: 0.77 mg/dL (ref 0.60–0.93)
Glucose, Bld: 131 mg/dL — ABNORMAL HIGH (ref 65–99)
Potassium: 4.4 mmol/L (ref 3.5–5.3)
Sodium: 140 mmol/L (ref 135–146)

## 2017-09-04 LAB — LIPID PANEL
Cholesterol: 148 mg/dL (ref <200)
HDL: 43 mg/dL — ABNORMAL LOW (ref >50)
LDL Cholesterol (Calc): 75 mg/dL (calc)
Non-HDL Cholesterol (Calc): 105 mg/dL (calc) (ref <130)
Total CHOL/HDL Ratio: 3.4 (calc) (ref <5.0)
Triglycerides: 199 mg/dL — ABNORMAL HIGH (ref <150)

## 2017-09-04 LAB — HEMOGLOBIN A1C
Hgb A1c MFr Bld: 7.6 % of total Hgb — ABNORMAL HIGH (ref <5.7)
Mean Plasma Glucose: 171 (calc)
eAG (mmol/L): 9.5 (calc)

## 2017-09-05 ENCOUNTER — Encounter: Payer: Self-pay | Admitting: Internal Medicine

## 2017-09-05 ENCOUNTER — Ambulatory Visit (INDEPENDENT_AMBULATORY_CARE_PROVIDER_SITE_OTHER): Payer: Medicare Other | Admitting: Internal Medicine

## 2017-09-05 VITALS — BP 140/80 | HR 60 | Temp 98.5°F | Ht 62.0 in | Wt 167.0 lb

## 2017-09-05 DIAGNOSIS — E1169 Type 2 diabetes mellitus with other specified complication: Secondary | ICD-10-CM | POA: Diagnosis not present

## 2017-09-05 DIAGNOSIS — Z6830 Body mass index (BMI) 30.0-30.9, adult: Secondary | ICD-10-CM | POA: Insufficient documentation

## 2017-09-05 DIAGNOSIS — I6521 Occlusion and stenosis of right carotid artery: Secondary | ICD-10-CM | POA: Diagnosis not present

## 2017-09-05 DIAGNOSIS — I480 Paroxysmal atrial fibrillation: Secondary | ICD-10-CM

## 2017-09-05 DIAGNOSIS — R490 Dysphonia: Secondary | ICD-10-CM | POA: Diagnosis not present

## 2017-09-05 DIAGNOSIS — Z23 Encounter for immunization: Secondary | ICD-10-CM | POA: Diagnosis not present

## 2017-09-05 DIAGNOSIS — E785 Hyperlipidemia, unspecified: Secondary | ICD-10-CM | POA: Diagnosis not present

## 2017-09-05 DIAGNOSIS — E6609 Other obesity due to excess calories: Secondary | ICD-10-CM | POA: Diagnosis not present

## 2017-09-05 DIAGNOSIS — J302 Other seasonal allergic rhinitis: Secondary | ICD-10-CM

## 2017-09-05 MED ORDER — FLUTICASONE PROPIONATE 50 MCG/ACT NA SUSP: 2.0000 | Freq: Every day | NASAL | 6 refills | Status: DC

## 2017-09-05 NOTE — Progress Notes (Signed)
Location:  [facility] clinic Provider:  Annaleigha Woo L. Mariea Clonts, D.O., C.M.D.  Goals of Care:  Advanced Directives 09/05/2017  Does Patient Have a Medical Advance Directive? Yes  Type of Paramedic of [place];Living will  Does patient want to make changes to medical advance directive? No - Patient declined  Copy of [place] in Chart? Yes  Would patient like information on creating a medical advance directive? No - Patient declined   Chief Complaint  Patient presents with  . Medical Management of Chronic Issues    9mth follow-up    HPI: Patient is a 77 y.o. female seen today for medical management of chronic diseases.    DMII:  Sugar has been good.  hba1c 7.6 up from 7.1 in Nov.  She just got back form her granddaughter's graduation for 4 days and they ate out every night.  They ate poorly during that trip.  She ate more meat than usual.  LDL 75.  Triglycerides went up also to 199 from 116.  She has not been sticking to her diet.  She's no longer going to the gym.  Her insurance quit covering silver sneakers.  They are going to get a gym room in their house.  Weight is up one lb.  Encouraged walking outside which she's not done.    She is hoarse.  She doesn't know why.  It's been getting worse over the past 10 days, but started mid to end of May.  She's had some minor allergy complaints before the allergy tablets were started.  Needs a flonase refill.  Not feeling postnasal drip.  She didn't take any allergy pills while she was away so only started on Monday (4 days ago).  Slight throat tenderness.  Not sore.    Dr. Harl Bowie is her cardiologist at [facility].  He adjusted her bp medication.  She is also followed for her afib and aortic stenosis  Has a GI appt when she gets back from her next trip.  Swallowing did not get much better after dilation.   [facility]--sees Neil Crouch, PA.  Dr. Gala Romney.  She had gastritis on the EGD in  10/18.    They have remodeled her apt area.  She is now staying in her own space more.    Dr. Baird Cancer 07/02/17.   Shingrix is not covered on her insurance--almost $300.    Past Medical History:  Diagnosis Date  . Adjustment disorder 10/17/2005  . Atrial fibrillation ([place])   . Cataract    bilateral  . CHF (congestive heart failure) ([place])   . Contracture of knee joint 07/26/2009  . Coronary artery disease   . Degeneration of lumbar or lumbosacral intervertebral disc 07/26/2009  . Diabetes mellitus without complication ([place])   . Esophageal spasm   . Esophageal spasm   . Fibromyalgia   . Fibromyalgia   . Hearing loss 12/18/2010  . Heart murmur   . Hypercholesteremia   . Hypertension   . 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 Leaky heart valve   . Lyme disease   . Macular degeneration   . Macular degeneration of both eyes   . Migraine 10/17/2005  . Mixed incontinence 10/17/2005  . Osteoarthritis    multiple joints   . Overweight 09/19/2010  . Pneumonia   . PONV (postoperative nausea and vomiting)   . Postartificial menopausal syndrome 10/10/2009  . Prinzmetal angina ([place]) 10/17/2005  . Psoriasis 06/12/2006  . Sciatica   .  TIA (transient ischemic attack)   . Type II diabetes mellitus ([place])   . [name] 10/17/2005    Past Surgical History:  Procedure Laterality Date  . ABDOMINAL HYSTERECTOMY    . AORTIC VALVE REPLACEMENT  12/10/2013   porcine  . CHOLECYSTECTOMY  1981  . COLONOSCOPY  04/18/2006   Dr. Nelva Nay: internal hemorrhoids  . COLONOSCOPY  10/2003   Dr. Alphonsa Gin: hemorrhoids  . CORONARY ARTERY BYPASS GRAFT  2015  . ESOPHAGOGASTRODUODENOSCOPY  10/16/2013   Dr. Marla Roe: prior Nissen fundoplication intact, hypertonic LES, dilated up to 11 [place] with moderate resistance, gastritis but no H. pylori., Reactive gastritis, no celiac disease.  . ESOPHAGOGASTRODUODENOSCOPY  05/29/2012   Dr. Doy Mince: Moderately severe esophagitis, acute  gastritis reactive, no H pylori, no celiac. Esophageal biopsies consistent with GERD, no Barrett  . ESOPHAGOGASTRODUODENOSCOPY  03/21/2009   Dr. Doy Mince: Reflux esophagitis, gastritis without H. pylori, esophagus stretched 57 savory  . ESOPHAGOGASTRODUODENOSCOPY  02/20/2008   Dr. Doy Mince: Esophagus dilated to 13 [place], reactive gastropathy with no H pylori. No Barrett's on esophageal biopsy  . ESOPHAGOGASTRODUODENOSCOPY  09/24/2006   Dr. Doy Mince: Tight wrap noted, reactive gastropathy, no Barrett's  . ESOPHAGOGASTRODUODENOSCOPY (EGD) WITH PROPOFOL N/A 01/09/2017   Procedure: ESOPHAGOGASTRODUODENOSCOPY (EGD) WITH PROPOFOL;  Surgeon: Daneil Dolin, MD;  [facility];  Service: Endoscopy;  Laterality: N/A;  2:15PM  . FRACTURE SURGERY Left    wrist  . JOINT REPLACEMENT    . MALONEY DILATION N/A 01/09/2017   Procedure: Venia Minks DILATION;  Surgeon: Daneil Dolin, MD;  Location: [facility];  Service: Endoscopy;  Laterality: N/A;  . MITRAL VALVE REPAIR  11/2013  . NISSEN FUNDOPLICATION  3500  . REPLACEMENT TOTAL KNEE BILATERAL  2010/2013    Allergies  Allergen Reactions  . Penicillins Anaphylaxis and Other (See Comments)    Has patient had a PCN reaction causing immediate rash, facial/tongue/throat swelling, SOB or lightheadedness with hypotension: Yes Has patient had a PCN reaction causing severe rash involving mucus membranes or skin necrosis: Yes Has patient had a PCN reaction that required hospitalization Yes Has patient had a PCN reaction occurring within the last 10 years: No If all of the above answers are "NO", then may proceed with Cephalosporin use.   . Latex Other (See Comments)    Redness and rash  . Morphine And Related Nausea Only and Other (See Comments)    Dizziness  . Tape Other (See Comments)    Redness and rash  . Iodine Other (See Comments)    Redness and rash    Outpatient Encounter Medications as of 09/05/2017  Medication Sig  . acetaminophen  (TYLENOL) 650 MG CR tablet Take 650-1,300 mg by mouth every 8 (eight) hours as needed for pain.   Marland Kitchen aspirin EC 81 MG tablet Take 81 mg by mouth every morning.   Marland Kitchen atorvastatin (LIPITOR) 80 MG tablet TAKE 1 TABLET EVERY DAY  . Calcium Carb-Cholecalciferol (CALTRATE 600+D3 SOFT PO) Take 1 tablet by mouth 2 (two) times daily.   . cetirizine (ZYRTEC) 10 MG tablet Take 10 mg by mouth daily as needed for allergies.   . Cholecalciferol (VITAMIN D3) 2000 units TABS Take 2,000 Units by mouth daily.   . diphenhydrAMINE-zinc acetate (BENADRYL) cream Apply 1 application topically 3 (three) times daily as needed for itching.  . fluticasone (FLONASE) 50 MCG/ACT nasal spray Place 2 sprays into both nostrils daily.  . furosemide (LASIX) 20 MG tablet TAKE 1 TABLET (20 MG TOTAL) BY MOUTH EVERY  MORNING AT 8 AM  . gabapentin (NEURONTIN) 100 MG capsule TAKE 1 CAPSULE  EVERY MORNING AND TAKE 2 TO 3 CAPSULES  AT BEDTIME  . lisinopril (PRINIVIL,ZESTRIL) 10 MG tablet Take 1 tablet (10 mg total) by mouth daily.  Marland Kitchen loperamide (IMODIUM A-D) 2 MG tablet Take 2 mg by mouth as needed for diarrhea or loose stools.  . metFORMIN (GLUCOPHAGE) 500 MG tablet TAKE 1 TABLET TWICE DAILY WITH A MEAL AT 8AM AND 7PM  . metoprolol tartrate (LOPRESSOR) 25 MG tablet TAKE 1/2 TABLETS TWO TIMES DAILY AT 8 AM & 7 PM  . potassium chloride (K-DUR,KLOR-CON) 10 MEQ tablet TAKE 1 TABLET (10 MEQ TOTAL) BY MOUTH EVERY MORNING AT  8 AM  . warfarin (COUMADIN) 6 MG tablet TAKE 1 TABLET DAILY.  . [DISCONTINUED] potassium chloride (K-DUR) 10 MEQ tablet Take 10 mEq by mouth 2 (two) times daily. Take with lasix   No facility-administered encounter medications on file as of 09/05/2017.     Review of Systems:  Review of Systems  Constitutional: Negative for chills and fever.  HENT: Positive for hearing loss.        Getting worse; hoarseness  Eyes: Negative for blurred vision.  Respiratory: Negative for shortness of breath.   Cardiovascular: Negative for  chest pain, palpitations and leg swelling.  Gastrointestinal: Negative for abdominal pain, blood in stool, constipation and melena.  Genitourinary: Negative for dysuria.  Musculoskeletal: Positive for back pain. Negative for falls.  Skin: Negative for itching and rash.  Neurological: Negative for dizziness and loss of consciousness.  Psychiatric/Behavioral: Positive for memory loss. Negative for depression. The patient is not nervous/anxious and does not have insomnia.     Health Maintenance  Topic Date Due  . OPHTHALMOLOGY EXAM  10/10/2016  . PNA vac Low Risk Adult (2 of 2 - PPSV23) 10/31/2016  . FOOT EXAM  12/05/2016  . INFLUENZA VACCINE  10/24/2017  . HEMOGLOBIN A1C  03/05/2018  . MAMMOGRAM  11/22/2018  . COLONOSCOPY  03/26/2022  . TETANUS/TDAP  10/13/2026  . DEXA SCAN  Completed    Physical Exam: Vitals:   09/05/17 1507  BP: (!) 148/80  Pulse: 60  Temp: 98.5 F (36.9 C)  TempSrc: Oral  SpO2: 96%  Weight: 167 lb (75.8 kg)  Height: 5\' 2"  (1.575 m)   Body mass index is 30.54 kg/m. Physical Exam  Constitutional: She is oriented to person, place, and time. She appears well-developed and well-nourished. No distress.  HENT:  Head: Normocephalic and atraumatic.  Right Ear: External ear normal.  Left Ear: External ear normal.  Nose: Nose normal.  Mouth/Throat: Oropharynx is clear and moist. No oropharyngeal exudate.  Eyes: Conjunctivae are normal.  Neck: Neck supple. No JVD present. No tracheal deviation present. No thyromegaly present.  Cardiovascular: Normal rate, regular rhythm and intact distal pulses.  Murmur heard. Pulmonary/Chest: Effort normal and breath sounds normal. No respiratory distress. She has no wheezes. She has no rales.  Abdominal: Soft. Bowel sounds are normal. She exhibits no mass. There is no tenderness. There is no rebound and no guarding.  Musculoskeletal: Normal range of motion. She exhibits no tenderness.  Lymphadenopathy:    She has no  cervical adenopathy.  Neurological: She is alert and oriented to person, place, and time.  Skin: Skin is warm and dry.  Psychiatric: She has a normal mood and affect.    Labs reviewed: Basic Metabolic Panel: Recent Labs    01/03/17 0919 02/07/17 0913 09/03/17 0819  NA 138 141 140  K 3.8 4.6 4.4  CL 102 101 104  CO2 29 32 30  GLUCOSE 99 120* 131*  BUN 24* 26* 24  CREATININE 1.13* 1.14* 0.77  CALCIUM 8.8* 9.8 9.3   Liver Function Tests: Recent Labs    10/04/16 0958  AST 19  ALT 17  ALKPHOS 99  BILITOT 0.5  PROT 6.8  ALBUMIN 4.0   No results for input(s): LIPASE, AMYLASE in the last 8760 hours. No results for input(s): AMMONIA in the last 8760 hours. CBC: Recent Labs    10/04/16 0958 01/03/17 0919 09/03/17 0819  WBC 8.8 8.6 7.7  NEUTROABS 5,896 5.3 4,766  HGB 12.4 11.7* 12.4  HCT 37.8 36.5 36.3  MCV 85.9 89.0 85.8  PLT 394 316 351   Lipid Panel: Recent Labs    10/04/16 0958 09/03/17 0819  CHOL 133 148  HDL 53 43*  LDLCALC 57 75  TRIG 116 199*  CHOLHDL 2.5 3.4   Lab Results  Component Value Date   HGBA1C 7.6 (H) 09/03/2017   Assessment/Plan 1. Seasonal allergies - continue zyrtec which she started just 4 days ago and cont flonase -may help hoarseness - fluticasone (FLONASE) 50 MCG/ACT nasal spray; Place 2 sprays into both nostrils daily.  Dispense: 16 g; Refill: 6  2. Body mass index (BMI) of 30.0-30.9 in adult -noted, educated on weight loss plan today with diet and restarting walking since gym no longer covered on insurance  3. Class 1 obesity due to excess calories with serious comorbidity and body mass index (BMI) of 30.0 to 30.9 in adult -again, diet and exercise education performed, provided low fat and low cholesterol diet  4. Type 2 diabetes mellitus with other specified complication, without long-term current use of insulin (HCC) -cont metformin, ace, statin, asa  5. Hyperlipidemia associated with type 2 diabetes mellitus ([place]) -cont  lipitor therapy and work on diet and exercise  6. Hoarseness -suspect mix of her allergies and reflux given her h/o gastritis -educated about gerd and potential impact on vocal cords  7. Paroxysmal atrial fibrillation (HCC) -cont warfarin anticoagulation--monitored through cardiology and lopressor for rate control  8. Need for 23-polyvalent pneumococcal polysaccharide vaccine - Pneumococcal polysaccharide vaccine 23-valent greater than or equal to 2yo subcutaneous/IM Given today, now up to date on pneumonia vaccines  Labs/tests ordered:   Orders Placed This Encounter  Procedures  . Pneumococcal polysaccharide vaccine 23-valent greater than or equal to 2yo subcutaneous/IM   Next appt:  01/09/2018--she will get her labs done at [facility] beforehand so they don't have to drive to [place] twice   Deasiah Hagberg L. Yeison Sippel, D.O. [facility] [address], [place] [address] Cell Phone (Mon-Fri 8am-5pm):  [phone] On Call:  [phone] & follow prompts after 5pm & weekends Office Phone:  [phone] Office Fax:  [phone]

## 2017-09-05 NOTE — Patient Instructions (Addendum)
Fat and Cholesterol Restricted Diet Getting too much fat and cholesterol in your diet may cause health problems. Following this diet helps keep your fat and cholesterol at normal levels. This can keep you from getting sick. What types of fat should I choose?  Choose monosaturated and polyunsaturated fats. These are found in foods such as olive oil, canola oil, flaxseeds, walnuts, almonds, and seeds.  Eat more omega-3 fats. Good choices include salmon, mackerel, sardines, tuna, flaxseed oil, and ground flaxseeds.  Limit saturated fats. These are in animal products such as meats, butter, and cream. They can also be in plant products such as palm oil, palm kernel oil, and coconut oil.  Avoid foods with partially hydrogenated oils in them. These contain trans fats. Examples of foods that have trans fats are stick margarine, some tub margarines, cookies, crackers, and other baked goods. What general guidelines do I need to follow?  Check food labels. Look for the words "trans fat" and "saturated fat."  When preparing a meal: ? Fill half of your plate with vegetables and green salads. ? Fill one fourth of your plate with whole grains. Look for the word "whole" as the first word in the ingredient list. ? Fill one fourth of your plate with lean protein foods.  Eat more foods that have fiber, like apples, carrots, beans, peas, and barley.  Eat more home-cooked foods. Eat less at restaurants and buffets.  Limit or avoid alcohol.  Limit foods high in starch and sugar.  Limit fried foods.  Cook foods without frying them. Baking, boiling, grilling, and broiling are all great options.  Lose weight if you are overweight. Losing even a small amount of weight can help your overall health. It can also help prevent diseases such as diabetes and heart disease. What foods can I eat? Grains Whole grains, such as whole wheat or whole grain breads, crackers, cereals, and pasta. Unsweetened oatmeal,  bulgur, barley, quinoa, or brown rice. Corn or whole wheat flour tortillas. Vegetables Fresh or frozen vegetables (raw, steamed, roasted, or grilled). Green salads. Fruits All fresh, canned (in natural juice), or frozen fruits. Meat and Other Protein Products Ground beef (85% or leaner), grass-fed beef, or beef trimmed of fat. Skinless chicken or [place]. Ground chicken or [place]. Pork trimmed of fat. All fish and seafood. Eggs. Dried beans, peas, or lentils. Unsalted nuts or seeds. Unsalted canned or dry beans. Dairy Low-fat dairy products, such as skim or 1% milk, 2% or reduced-fat cheeses, low-fat ricotta or cottage cheese, or plain low-fat yogurt. Fats and Oils Tub margarines without trans fats. Light or reduced-fat mayonnaise and salad dressings. Avocado. Olive, canola, sesame, or safflower oils. Natural peanut or almond butter (choose ones without added sugar and oil). The items listed above may not be a complete list of recommended foods or beverages. Contact your dietitian for more options. What foods are not recommended? Grains White bread. White pasta. White rice. Cornbread. Bagels, pastries, and croissants. Crackers that contain trans fat. Vegetables White potatoes. Corn. Creamed or fried vegetables. Vegetables in a cheese sauce. Fruits Dried fruits. Canned fruit in light or heavy syrup. Fruit juice. Meat and Other Protein Products Fatty cuts of meat. Ribs, chicken wings, bacon, sausage, bologna, salami, chitterlings, fatback, hot dogs, bratwurst, and packaged luncheon meats. Liver and organ meats. Dairy Whole or 2% milk, cream, half-and-half, and cream cheese. Whole milk cheeses. Whole-fat or sweetened yogurt. Full-fat cheeses. Nondairy creamers and whipped toppings. Processed cheese, cheese spreads, or cheese curds. Sweets and Desserts  Corn syrup, sugars, honey, and molasses. Candy. Jam and jelly. Syrup. Sweetened cereals. Cookies, pies, cakes, donuts, muffins, and ice  cream. Fats and Oils Butter, stick margarine, lard, shortening, ghee, or bacon fat. Coconut, palm kernel, or palm oils. Beverages Alcohol. Sweetened drinks (such as sodas, lemonade, and fruit drinks or punches). The items listed above may not be a complete list of foods and beverages to avoid. Contact your dietitian for more information. This information is not intended to replace advice given to you by your health care provider. Make sure you discuss any questions you have with your health care provider. Document Released: 09/11/2011 Document Revised: 11/17/2015 Document Reviewed: 06/11/2013 Elsevier Interactive Patient Education  2018 [place] for Gastroesophageal Reflux Disease, Adult When you have gastroesophageal reflux disease (GERD), the foods you eat and your eating habits are very important. Choosing the right foods can help ease your discomfort. What guidelines do I need to follow?  Choose fruits, vegetables, whole grains, and low-fat dairy products.  Choose low-fat meat, fish, and poultry.  Limit fats such as oils, salad dressings, butter, nuts, and avocado.  Keep a food diary. This helps you identify foods that cause symptoms.  Avoid foods that cause symptoms. These may be different for everyone.  Eat small meals often instead of 3 large meals a day.  Eat your meals slowly, in a place where you are relaxed.  Limit fried foods.  Cook foods using methods other than frying.  Avoid drinking alcohol.  Avoid drinking large amounts of liquids with your meals.  Avoid bending over or lying down until 2-3 hours after eating. What foods are not recommended? These are some foods and drinks that may make your symptoms worse: Vegetables Tomatoes. Tomato juice. Tomato and spaghetti sauce. Chili peppers. Onion and garlic. Horseradish. Fruits Oranges, grapefruit, and lemon (fruit and juice). Meats High-fat meats, fish, and poultry. This includes hot dogs,  ribs, ham, sausage, salami, and bacon. Dairy Whole milk and chocolate milk. Sour cream. Cream. Butter. Ice cream. Cream cheese. Drinks Coffee and tea. Bubbly (carbonated) drinks or energy drinks. Condiments Hot sauce. Barbecue sauce. Sweets/Desserts Chocolate and cocoa. Donuts. Peppermint and spearmint. Fats and Oils High-fat foods. This includes [place] fries and potato chips. Other Vinegar. Strong spices. This includes black pepper, white pepper, red pepper, cayenne, curry powder, cloves, ginger, and chili powder. The items listed above may not be a complete list of foods and drinks to avoid. Contact your dietitian for more information. This information is not intended to replace advice given to you by your health care provider. Make sure you discuss any questions you have with your health care provider. Document Released: 09/11/2011 Document Revised: 08/18/2015 Document Reviewed: 01/14/2013 Elsevier Interactive Patient Education  2017 [employer].

## 2017-09-10 ENCOUNTER — Ambulatory Visit (INDEPENDENT_AMBULATORY_CARE_PROVIDER_SITE_OTHER): Payer: Medicare Other | Admitting: *Deleted

## 2017-09-10 DIAGNOSIS — Z8673 Personal history of transient ischemic attack (TIA), and cerebral infarction without residual deficits: Secondary | ICD-10-CM

## 2017-09-10 DIAGNOSIS — I4891 Unspecified atrial fibrillation: Secondary | ICD-10-CM

## 2017-09-10 DIAGNOSIS — Z5181 Encounter for therapeutic drug level monitoring: Secondary | ICD-10-CM

## 2017-09-10 LAB — POCT INR: INR: 2.2 (ref 2.0–3.0)

## 2017-09-10 NOTE — Patient Instructions (Signed)
Continue coumadin 1 tablet daily except 1/2 tablet on Sundays and Wednesdays  Recheck in 4 weeks 

## 2017-09-17 ENCOUNTER — Encounter: Payer: Self-pay | Admitting: Gastroenterology

## 2017-09-17 ENCOUNTER — Ambulatory Visit (INDEPENDENT_AMBULATORY_CARE_PROVIDER_SITE_OTHER): Payer: Medicare Other | Admitting: Gastroenterology

## 2017-09-17 VITALS — BP 147/77 | HR 71 | Temp 98.7°F | Ht 62.0 in | Wt 167.0 lb

## 2017-09-17 DIAGNOSIS — R490 Dysphonia: Secondary | ICD-10-CM | POA: Diagnosis not present

## 2017-09-17 DIAGNOSIS — I6521 Occlusion and stenosis of right carotid artery: Secondary | ICD-10-CM

## 2017-09-17 DIAGNOSIS — R1319 Other dysphagia: Secondary | ICD-10-CM

## 2017-09-17 DIAGNOSIS — R1310 Dysphagia, unspecified: Secondary | ICD-10-CM | POA: Diagnosis not present

## 2017-09-17 MED ORDER — PANTOPRAZOLE SODIUM 40 MG PO TBEC: 40.0000 mg | DELAYED_RELEASE_TABLET | Freq: Every day | ORAL | 1 refills | Status: DC

## 2017-09-17 NOTE — Assessment & Plan Note (Addendum)
77 year old female with history of remote open Niesen fundoplication noted to be intact on multiple EGDs, who presents with persistent dysphagia to solids and liquids.  History recent EGD with dilation in October 2018 with no noted benefit.  Prior to that EGD she had a barium pill esophagram indicating distal esophageal stricture.  Also noted to have age-related esophageal dysmotility.    She is now complaining of hoarseness.  No typical heartburn.  Treatment for allergies has not helped her hoarseness or her new cough.  Discussed possibility of LPR.  She is not on a PPI at this point.  Offered her trial of PPI therapy to see if this helps.  Begin pantoprazole 40 mg daily.  They will contact me in a few weeks with a progress report.  If she has no noted improvement in her swallowing or her hoarseness, would pursue further evaluation possibly via ENT for hoarseness and/or repeat barium esophagram to see if any persistent stricture noted.  Return to the office in 4 months with Dr. Gala Romney.

## 2017-09-17 NOTE — Progress Notes (Signed)
Primary Care Physician: Gayland Curry, DO  Primary Gastroenterologist:  Garfield Cornea, MD   Chief Complaint  Patient presents with  . Dysphagia    c/o hoarsness for a while, trouble with water/food    HPI: Jeanette Yates is a 77 y.o. female here for follow up of dysphagia. Initially seen in 10/2016. She has h/o mild oropharyngeal phase dysphagia. See 10/2016 note for complete details. She has h[place]. Remote open Nissan fundoplication in the 09X. Had BPE 11/2016 with narrowing at the gastroesophageal junction, which obstructed a 12.5 mm diameter barium tablet. Discrete shoulders of a Nissen fundoplication wrap are not visualized. Smooth appearance of esophageal mucosa on air contrast imaging without irregularity or ulceration. Diffuse age-related impairment of esophageal motility with incomplete clearance of barium by primary peristaltic waves and prolonged thoracic esophageal retention of contrast.  Patient underwent EGD with esophageal dilation by Dr. Buford Dresser in October 2018.  Dilation performed up to 63 [place].  Esophagus appeared normal.  Inflammation noted in the entire stomach.  Prior fundoplication noted.  Gastric biopsies consistent with mild chronic gastritis, no H. pylori.  Patient reports no significant improvement of her dysphagia status post esophageal dilation.  Never really has gained benefit from esophageal dilation in the past either.  Continues to have solid food dysphagia but more recently having problems swallowing liquids as well.  Feels like it is sitting in her chest and sometimes comes back up.  Denies problems swallowing her pills however.  About 3-4 times a year she gets strangled like liquid goes down the wrong way.    She is also concerned about progressive hoarseness over the past several weeks.  PCP question possible allergies.  She has been on Zyrtec for 2 to 3 weeks as well as Flonase but no noted benefit.  Nuys typical  heartburn.  No abdominal pain.  Has a couple of bowel movements a day, typically loose.  No blood in the stool or melena.    She believes her last colonoscopy was done in [place] around 5 years ago.  When we are initially requested records we received 2008 colonoscopy report.  She is adamant that she had one since then.  She is open to possibility of colonoscopy when necessary.    Current Outpatient Medications  Medication Sig Dispense Refill  . acetaminophen (TYLENOL) 650 MG CR tablet Take 650-1,300 mg by mouth every 8 (eight) hours as needed for pain.     Marland Kitchen aspirin EC 81 MG tablet Take 81 mg by mouth every morning.     Marland Kitchen atorvastatin (LIPITOR) 80 MG tablet TAKE 1 TABLET EVERY DAY 90 tablet 3  . Calcium Carb-Cholecalciferol (CALTRATE 600+D3 SOFT PO) Take 1 tablet by mouth 2 (two) times daily.     . cetirizine (ZYRTEC) 10 MG tablet Take 10 mg by mouth daily as needed for allergies.     . Cholecalciferol (VITAMIN D3) 2000 units TABS Take 2,000 Units by mouth daily.     . fluticasone (FLONASE) 50 MCG/ACT nasal spray Place 2 sprays into both nostrils daily. (Patient taking differently: Place 2 sprays into both nostrils as needed. ) 16 g 6  . furosemide (LASIX) 20 MG tablet TAKE 1 TABLET (20 MG TOTAL) BY MOUTH EVERY MORNING AT 8 AM 90 tablet 1  . gabapentin (NEURONTIN) 100 MG capsule TAKE 1 CAPSULE  EVERY MORNING AND TAKE 2 TO 3 CAPSULES  AT BEDTIME 360 capsule 1  . lisinopril (PRINIVIL,ZESTRIL) 10  MG tablet Take 1 tablet (10 mg total) by mouth daily. 90 tablet 3  . loperamide (IMODIUM A-D) 2 MG tablet Take 2 mg by mouth as needed for diarrhea or loose stools.    . metFORMIN (GLUCOPHAGE) 500 MG tablet TAKE 1 TABLET TWICE DAILY WITH A MEAL AT 8AM AND 7PM 180 tablet 3  . metoprolol tartrate (LOPRESSOR) 25 MG tablet TAKE 1/2 TABLETS TWO TIMES DAILY AT 8 AM & 7 PM 90 tablet 0  . potassium chloride (K-DUR,KLOR-CON) 10 MEQ tablet TAKE 1 TABLET (10 MEQ TOTAL) BY MOUTH EVERY MORNING AT  8 AM 90 tablet 1    . warfarin (COUMADIN) 6 MG tablet TAKE 1 TABLET DAILY. 90 tablet 3  . diphenhydrAMINE-zinc acetate (BENADRYL) cream Apply 1 application topically 3 (three) times daily as needed for itching.     No current facility-administered medications for this visit.     Allergies as of 09/17/2017 - Review Complete 09/05/2017  Allergen Reaction Noted  . Penicillins Anaphylaxis and Other (See Comments) 05/02/2015  . Latex Other (See Comments) 11/15/2015  . Morphine and related Nausea Only and Other (See Comments) 07/26/2015  . Tape Other (See Comments) 07/26/2015  . Iodine Other (See Comments) 11/15/2015    ROS:  General: Negative for anorexia, weight loss, fever, chills, fatigue, weakness. ENT: Negative for hoarseness,   nasal congestion. See hpi CV: Negative for chest pain, angina, palpitations, dyspnea on exertion, peripheral edema.  Respiratory: Negative for dyspnea at rest, dyspnea on exertion, cough, sputum, wheezing.  GI: See history of present illness. GU:  Negative for dysuria, hematuria, urinary incontinence, urinary frequency, nocturnal urination.  Endo: Negative for unusual weight change.    Physical Examination:   BP (!) 147/77   Pulse 71   Temp 98.7 F (37.1 C) (Oral)   Ht 5\' 2"  (1.575 m)   Wt 167 lb (75.8 kg)   BMI 30.54 kg/m   General: Well-nourished, well-developed in no acute distress. Accompanied by daughter.  Eyes: No icterus. Mouth: Oropharyngeal mucosa moist and pink , no lesions erythema or exudate. Lungs: Clear to auscultation bilaterally.  Heart: Regular rate and rhythm, no murmurs rubs or gallops.  Abdomen: Bowel sounds are normal, nontender, nondistended, no hepatosplenomegaly or masses, no abdominal bruits or hernia , no rebound or guarding.   Extremities: No lower extremity edema. No clubbing or deformities. Neuro: Alert and oriented x 4   Skin: Warm and dry, no jaundice.   Psych: Alert and cooperative, normal mood and affect.  Labs:  Lab Results   Component Value Date   CREATININE 0.77 09/03/2017   BUN 24 09/03/2017   NA 140 09/03/2017   K 4.4 09/03/2017   CL 104 09/03/2017   CO2 30 09/03/2017   Lab Results  Component Value Date   WBC 7.7 09/03/2017   HGB 12.4 09/03/2017   HCT 36.3 09/03/2017   MCV 85.8 09/03/2017   PLT 351 09/03/2017   Lab Results  Component Value Date   HGBA1C 7.6 (H) 09/03/2017   Lab Results  Component Value Date   ALT 17 10/04/2016   AST 19 10/04/2016   ALKPHOS 99 10/04/2016   BILITOT 0.5 10/04/2016      Imaging Studies: No results found.

## 2017-09-17 NOTE — Patient Instructions (Addendum)
1. Start pantoprazole 40mg  daily before breakfast to see if this helps hoarseness and swallowing (ie for possible atypical reflux).  2. Let me know how medication is working in the next several weeks. If no significant improvement then we will pursue additional work up.  3. Try increasing dietary fiber or add a supplement such as FiberChoice chew two daily to see if this will help your stool consistency. Reach out with any questions or concerns.  4. I will request last colonoscopy for review.  5. Return office visit in four months with Dr. Gala Romney.

## 2017-09-17 NOTE — Progress Notes (Signed)
cc'd to pcp 

## 2017-09-19 ENCOUNTER — Telehealth: Payer: Self-pay | Admitting: *Deleted

## 2017-09-19 ENCOUNTER — Encounter: Payer: Self-pay | Admitting: Nurse Practitioner

## 2017-09-19 ENCOUNTER — Ambulatory Visit (INDEPENDENT_AMBULATORY_CARE_PROVIDER_SITE_OTHER): Payer: Medicare Other | Admitting: Nurse Practitioner

## 2017-09-19 ENCOUNTER — Encounter: Payer: Self-pay | Admitting: Cardiology

## 2017-09-19 VITALS — BP 134/82 | HR 64 | Temp 98.6°F | Ht 62.0 in | Wt 161.8 lb

## 2017-09-19 DIAGNOSIS — J40 Bronchitis, not specified as acute or chronic: Secondary | ICD-10-CM | POA: Diagnosis not present

## 2017-09-19 DIAGNOSIS — I6521 Occlusion and stenosis of right carotid artery: Secondary | ICD-10-CM | POA: Diagnosis not present

## 2017-09-19 DIAGNOSIS — R197 Diarrhea, unspecified: Secondary | ICD-10-CM | POA: Diagnosis not present

## 2017-09-19 MED ORDER — BENZONATATE 100 MG PO CAPS: 100.0000 mg | ORAL_CAPSULE | Freq: Three times a day (TID) | ORAL | 0 refills | Status: DC | PRN

## 2017-09-19 MED ORDER — LEVOFLOXACIN 500 MG PO TABS: 500.0000 mg | ORAL_TABLET | Freq: Every day | ORAL | 0 refills | Status: DC

## 2017-09-19 NOTE — Patient Instructions (Addendum)
To hold off on antibiotic but if symptoms worsen over the weekend to start If you start levaquin need to follow up INR early next week  To use MUCINEX DM by mouth twice daily for 7 days with full glass of water To increase water intake  Bland diet and advance as tolerates  To use FLORASTOR twice daily for 7 days, more if needed  Can also use benefiber daily to help bulk stools

## 2017-09-19 NOTE — Telephone Encounter (Signed)
Patient daughter called and left message on Clinical Intake stating patient had a temperature of 99.5, chills and diarrhea. Daughter was wondering if she had the flu. Wants to bring in for appointment.   Tried calling daughter back and Message on number stated that person is not accepting calls at this time. Will try back later.

## 2017-09-19 NOTE — Progress Notes (Signed)
Careteam: Patient Care Team: Gayland Curry, DO as PCP - General (Geriatric Medicine) Harl Bowie Alphonse Guild, MD as PCP - Cardiology (Cardiology) Gala Romney Cristopher Estimable, MD as Consulting Physician (Gastroenterology)  Advanced Directive information Does Patient Have a Medical Advance Directive?: Yes, Type of Advance Directive: [place];Living will  Allergies  Allergen Reactions  . Penicillins Anaphylaxis and Other (See Comments)    Has patient had a PCN reaction causing immediate rash, facial/tongue/throat swelling, SOB or lightheadedness with hypotension: Yes Has patient had a PCN reaction causing severe rash involving mucus membranes or skin necrosis: Yes Has patient had a PCN reaction that required hospitalization Yes Has patient had a PCN reaction occurring within the last 10 years: No If all of the above answers are "NO", then may proceed with Cephalosporin use.   . Latex Other (See Comments)    Redness and rash  . Morphine And Related Nausea Only and Other (See Comments)    Dizziness  . Tape Other (See Comments)    Redness and rash  . Iodine Other (See Comments)    Redness and rash    Chief Complaint  Patient presents with  . Acute Visit    Pt is being seen due to cough with yellow mucus for 3 days, diarrhea for 2 days, and fever off and on.      HPI: Patient is a 77 y.o. female seen in the office today due to cough and congestion with body aches, fever and chills with diarrhea and fatigue for 3 days.  She just got back from out of town and was very worn out but then the next day was feeling very poorly.  Drinking fluids, decrease appetite.  Diarrhea has been the worse but this has improved until she eats something.  No abdominal pain, no nausea or vomiting but stomach feels unsettled.  Reports she has been hoarse for sometime. Was recommended to start protonix but she has not started yet.  Now has a deep cough, which is a different cough then previously.   Has not taken anything for cough.  Not feeling very congested but having increased shortness of breath/wheezing.  Now can not walk to the mailbox. Has bronchitis in April, was placed on zpak with prednisone. Symptoms improved at that time.       Review of Systems:  Review of Systems  Constitutional: Positive for chills, fever and malaise/fatigue.  Respiratory: Positive for cough, shortness of breath and wheezing. Negative for sputum production.   Cardiovascular: Negative for chest pain.  Gastrointestinal: Positive for diarrhea. Negative for abdominal pain, constipation, nausea and vomiting.  Neurological: Negative for dizziness and headaches.    Past Medical History:  Diagnosis Date  . Adjustment disorder 10/17/2005  . Atrial fibrillation ([place])   . Cataract    bilateral  . CHF (congestive heart failure) ([place])   . Contracture of knee joint 07/26/2009  . Coronary artery disease   . Degeneration of lumbar or lumbosacral intervertebral disc 07/26/2009  . Diabetes mellitus without complication ([place])   . Esophageal spasm   . Esophageal spasm   . Fibromyalgia   . Fibromyalgia   . Hearing loss 12/18/2010  . Heart murmur   . Hypercholesteremia   . Hypertension   . 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 Leaky heart valve   . Lyme disease   . Macular degeneration   . Macular degeneration of both eyes   . Migraine 10/17/2005  . Mixed incontinence 10/17/2005  .  Osteoarthritis    multiple joints   . Overweight 09/19/2010  . Pneumonia   . PONV (postoperative nausea and vomiting)   . Postartificial menopausal syndrome 10/10/2009  . Prinzmetal angina ([place]) 10/17/2005  . Psoriasis 06/12/2006  . Sciatica   . TIA (transient ischemic attack)   . Type II diabetes mellitus ([place])   . [name] 10/17/2005   Past Surgical History:  Procedure Laterality Date  . ABDOMINAL HYSTERECTOMY    . AORTIC VALVE REPLACEMENT  12/10/2013   porcine  . CHOLECYSTECTOMY   1981  . COLONOSCOPY  04/18/2006   Dr. Nelva Nay: internal hemorrhoids  . COLONOSCOPY  10/2003   Dr. Alphonsa Gin: hemorrhoids  . CORONARY ARTERY BYPASS GRAFT  2015  . ESOPHAGOGASTRODUODENOSCOPY  10/16/2013   Dr. Marla Roe: prior Nissen fundoplication intact, hypertonic LES, dilated up to 22 [place] with moderate resistance, gastritis but no H. pylori., Reactive gastritis, no celiac disease.  . ESOPHAGOGASTRODUODENOSCOPY  05/29/2012   Dr. Doy Mince: Moderately severe esophagitis, acute gastritis reactive, no H pylori, no celiac. Esophageal biopsies consistent with GERD, no Barrett  . ESOPHAGOGASTRODUODENOSCOPY  03/21/2009   Dr. Doy Mince: Reflux esophagitis, gastritis without H. pylori, esophagus stretched 57 savory  . ESOPHAGOGASTRODUODENOSCOPY  02/20/2008   Dr. Doy Mince: Esophagus dilated to 99 French, reactive gastropathy with no H pylori. No Barrett's on esophageal biopsy  . ESOPHAGOGASTRODUODENOSCOPY  09/24/2006   Dr. Doy Mince: Tight wrap noted, reactive gastropathy, no Barrett's  . ESOPHAGOGASTRODUODENOSCOPY (EGD) WITH PROPOFOL N/A 01/09/2017   Procedure: ESOPHAGOGASTRODUODENOSCOPY (EGD) WITH PROPOFOL;  Surgeon: Daneil Dolin, MD;  [facility];  Service: Endoscopy;  Laterality: N/A;  2:15PM  . FRACTURE SURGERY Left    wrist  . JOINT REPLACEMENT    . MALONEY DILATION N/A 01/09/2017   Procedure: Venia Minks DILATION;  Surgeon: Daneil Dolin, MD;  Location: [facility];  Service: Endoscopy;  Laterality: N/A;  . MITRAL VALVE REPAIR  11/2013  . NISSEN FUNDOPLICATION  8338  . REPLACEMENT TOTAL KNEE BILATERAL  2010/2013   Social History:   reports that she quit smoking about 51 years ago. Her smoking use included cigarettes. She has a 20.00 pack-year smoking history. She has never used smokeless tobacco. She reports that she does not drink alcohol or use drugs.  Family History  Problem Relation Age of Onset  . Stroke Mother   . Heart failure Mother   . Heart disease Mother    . Mitral valve prolapse Mother   . Diabetes Father   . Heart attack Father   . Stroke Father   . Heart disease Father   . Hypertension Father   . Alzheimer's disease Father   . Stroke Maternal Grandmother   . Arthritis Maternal Grandfather        hands  . Breast cancer Paternal Grandmother   . Osteoporosis Paternal Grandmother   . Breast cancer Cousin   . Colon cancer Neg Hx     Medications: Patient's Medications  New Prescriptions   No medications on file  Previous Medications   ACETAMINOPHEN (TYLENOL) 650 MG CR TABLET    Take 650-1,300 mg by mouth every 8 (eight) hours as needed for pain.    ASPIRIN EC 81 MG TABLET    Take 81 mg by mouth every morning.    ATORVASTATIN (LIPITOR) 80 MG TABLET    TAKE 1 TABLET EVERY DAY   CALCIUM CARB-CHOLECALCIFEROL (CALTRATE 600+D3 SOFT PO)    Take 1 tablet by mouth 2 (two) times daily.    CETIRIZINE (ZYRTEC) 10  MG TABLET    Take 10 mg by mouth daily as needed for allergies.    CHOLECALCIFEROL (VITAMIN D3) 2000 UNITS TABS    Take 2,000 Units by mouth daily.    FLUTICASONE (FLONASE) 50 MCG/ACT NASAL SPRAY    Place 2 sprays into both nostrils daily.   FUROSEMIDE (LASIX) 20 MG TABLET    TAKE 1 TABLET (20 MG TOTAL) BY MOUTH EVERY MORNING AT 8 AM   GABAPENTIN (NEURONTIN) 100 MG CAPSULE    TAKE 1 CAPSULE  EVERY MORNING AND TAKE 2 TO 3 CAPSULES  AT BEDTIME   LISINOPRIL (PRINIVIL,ZESTRIL) 10 MG TABLET    Take 1 tablet (10 mg total) by mouth daily.   LOPERAMIDE (IMODIUM A-D) 2 MG TABLET    Take 2 mg by mouth as needed for diarrhea or loose stools.   METFORMIN (GLUCOPHAGE) 500 MG TABLET    TAKE 1 TABLET TWICE DAILY WITH A MEAL AT 8AM AND 7PM   METOPROLOL TARTRATE (LOPRESSOR) 25 MG TABLET    TAKE 1/2 TABLETS TWO TIMES DAILY AT 8 AM & 7 PM   PANTOPRAZOLE (PROTONIX) 40 MG TABLET    Take 1 tablet (40 mg total) by mouth daily before breakfast.   POTASSIUM CHLORIDE (K-DUR,KLOR-CON) 10 MEQ TABLET    TAKE 1 TABLET (10 MEQ TOTAL) BY MOUTH EVERY MORNING AT  8 AM    WARFARIN (COUMADIN) 6 MG TABLET    TAKE 1 TABLET DAILY.  Modified Medications   No medications on file  Discontinued Medications   No medications on file     Physical Exam:  Vitals:   09/19/17 1550  BP: 134/82  Pulse: 64  Temp: 98.6 F (37 C)  TempSrc: Oral  SpO2: 95%  Weight: 161 lb 12.8 oz (73.4 kg)  Height: 5\' 2"  (1.575 m)   Body mass index is 29.59 kg/m.  Physical Exam  Constitutional: She is oriented to person, place, and time. She appears well-developed and well-nourished. No distress.  HENT:  Head: Normocephalic and atraumatic.  Right Ear: External ear normal.  Left Ear: External ear normal.  Nose: Mucosal edema and rhinorrhea present.  Mouth/Throat: Oropharynx is clear and moist. No oropharyngeal exudate.  Eyes: Pupils are equal, round, and reactive to light. Conjunctivae and EOM are normal.  Neck: Neck supple. No JVD present. No tracheal deviation present. No thyromegaly present.  Cardiovascular: Normal rate, regular rhythm and intact distal pulses.  Murmur heard. Pulmonary/Chest: Effort normal and breath sounds normal. No respiratory distress. She has no wheezes. She has no rales.  Abdominal: Soft. Bowel sounds are normal. There is no tenderness. There is guarding.  Musculoskeletal: Normal range of motion. She exhibits no tenderness.  Lymphadenopathy:    She has no cervical adenopathy.  Neurological: She is alert and oriented to person, place, and time.  Skin: Skin is warm and dry.  Psychiatric: She has a normal mood and affect.   Labs reviewed: Basic Metabolic Panel: Recent Labs    01/03/17 0919 02/07/17 0913 09/03/17 0819  NA 138 141 140  K 3.8 4.6 4.4  CL 102 101 104  CO2 29 32 30  GLUCOSE 99 120* 131*  BUN 24* 26* 24  CREATININE 1.13* 1.14* 0.77  CALCIUM 8.8* 9.8 9.3   Liver Function Tests: Recent Labs    10/04/16 0958  AST 19  ALT 17  ALKPHOS 99  BILITOT 0.5  PROT 6.8  ALBUMIN 4.0   No results for input(s): LIPASE, AMYLASE in the  last 8760 hours. No results  for input(s): AMMONIA in the last 8760 hours. CBC: Recent Labs    10/04/16 0958 01/03/17 0919 09/03/17 0819  WBC 8.8 8.6 7.7  NEUTROABS 5,896 5.3 4,766  HGB 12.4 11.7* 12.4  HCT 37.8 36.5 36.3  MCV 85.9 89.0 85.8  PLT 394 316 351   Lipid Panel: Recent Labs    10/04/16 0958 09/03/17 0819  CHOL 133 148  HDL 53 43*  LDLCALC 57 75  TRIG 116 199*  CHOLHDL 2.5 3.4   TSH: No results for input(s): TSH in the last 8760 hours. A1C: Lab Results  Component Value Date   HGBA1C 7.6 (H) 09/03/2017     Assessment/Plan 1. Bronchitis -recently treated with azithromycin.  -overall improving but still feeling poorly. Rx given for levaquin to start if she starts feeling worse (since going into weekend) as levaquin can worsen diarrhea and alter INR.  - levofloxacin (LEVAQUIN) 500 MG tablet; Take 1 tablet (500 mg total) by mouth daily.  Dispense: 7 tablet; Refill: 0 - benzonatate (TESSALON) 100 MG capsule; Take 1 capsule (100 mg total) by mouth every 8 (eight) hours as needed for cough.  Dispense: 20 capsule; Refill: 0 -will need follow up INR early next week if starts levaquin -to start mucinex DM by mouth twice daily with full glass of water -to use albuterol which was previously given as needed for wheezing. No wheezing noted during today' s exam.  -return precautions given.   2. Diarrhea of presumed infectious origin -may use florastor twice daily -bland diet advance as tolerated -to try bene-fiber daily   Next appt:  Carlos American. [place], [facility] [phone]

## 2017-09-19 NOTE — Telephone Encounter (Signed)
Daughter scheduled an appointment for patient to be seen today.

## 2017-09-24 ENCOUNTER — Ambulatory Visit (INDEPENDENT_AMBULATORY_CARE_PROVIDER_SITE_OTHER): Payer: Medicare Other | Admitting: *Deleted

## 2017-09-24 DIAGNOSIS — I4891 Unspecified atrial fibrillation: Secondary | ICD-10-CM | POA: Diagnosis not present

## 2017-09-24 DIAGNOSIS — Z5181 Encounter for therapeutic drug level monitoring: Secondary | ICD-10-CM

## 2017-09-24 DIAGNOSIS — Z8673 Personal history of transient ischemic attack (TIA), and cerebral infarction without residual deficits: Secondary | ICD-10-CM

## 2017-09-24 LAB — POCT INR: INR: 1.4 — AB (ref 2.0–3.0)

## 2017-09-24 NOTE — Patient Instructions (Signed)
Take coumadin 1 1/2 tablets tonight, take 1 tablet tomorrow night then resume 1 tablet daily except 1/2 tablet on Sundays and Wednesdays Recheck in 2 weeks

## 2017-09-28 NOTE — Progress Notes (Addendum)
Requested last colonoscopy and again last one received from 2008. She was supposed to have a five year follow up per that report.   Please let patient know above. We can discuss possible TCS at next ov.

## 2017-09-30 NOTE — Progress Notes (Signed)
Spoke with pt and she is aware that at the next office visit, TCS will be discussed. Pt would like to know if cologuard is an option instead of doing a TCS? Pt is aware of [facility] recommendations of TCS.

## 2017-10-08 ENCOUNTER — Other Ambulatory Visit (HOSPITAL_COMMUNITY)
Admission: RE | Admit: 2017-10-08 | Discharge: 2017-10-08 | Disposition: A | Discharge status: Home / Self Care | Payer: Medicare Other | Source: Ambulatory Visit | Attending: Cardiology | Admitting: Cardiology

## 2017-10-08 ENCOUNTER — Ambulatory Visit (HOSPITAL_COMMUNITY)
Admission: RE | Admit: 2017-10-08 | Discharge: 2017-10-08 | Disposition: A | Discharge status: Home / Self Care | Payer: Medicare Other | Source: Ambulatory Visit | Attending: Neurology | Admitting: Neurology

## 2017-10-08 ENCOUNTER — Ambulatory Visit (INDEPENDENT_AMBULATORY_CARE_PROVIDER_SITE_OTHER): Payer: Medicare Other | Admitting: *Deleted

## 2017-10-08 DIAGNOSIS — G459 Transient cerebral ischemic attack, unspecified: Secondary | ICD-10-CM

## 2017-10-08 DIAGNOSIS — Z5181 Encounter for therapeutic drug level monitoring: Secondary | ICD-10-CM

## 2017-10-08 DIAGNOSIS — E785 Hyperlipidemia, unspecified: Secondary | ICD-10-CM | POA: Diagnosis not present

## 2017-10-08 DIAGNOSIS — I6521 Occlusion and stenosis of right carotid artery: Secondary | ICD-10-CM

## 2017-10-08 DIAGNOSIS — G451 Carotid artery syndrome (hemispheric): Secondary | ICD-10-CM

## 2017-10-08 DIAGNOSIS — Z8673 Personal history of transient ischemic attack (TIA), and cerebral infarction without residual deficits: Secondary | ICD-10-CM | POA: Diagnosis not present

## 2017-10-08 DIAGNOSIS — Z952 Presence of prosthetic heart valve: Secondary | ICD-10-CM | POA: Diagnosis not present

## 2017-10-08 DIAGNOSIS — I38 Endocarditis, valve unspecified: Secondary | ICD-10-CM | POA: Diagnosis not present

## 2017-10-08 DIAGNOSIS — I10 Essential (primary) hypertension: Secondary | ICD-10-CM | POA: Diagnosis not present

## 2017-10-08 DIAGNOSIS — I480 Paroxysmal atrial fibrillation: Secondary | ICD-10-CM | POA: Insufficient documentation

## 2017-10-08 DIAGNOSIS — I6523 Occlusion and stenosis of bilateral carotid arteries: Secondary | ICD-10-CM | POA: Diagnosis not present

## 2017-10-08 DIAGNOSIS — I4891 Unspecified atrial fibrillation: Secondary | ICD-10-CM

## 2017-10-08 LAB — BASIC METABOLIC PANEL
Anion gap: 9 (ref 5–15)
BUN: 23 mg/dL (ref 8–23)
CHLORIDE: 104 mmol/L (ref 98–111)
CO2: 28 mmol/L (ref 22–32)
Calcium: 9.4 mg/dL (ref 8.9–10.3)
Creatinine, Ser: 0.97 mg/dL (ref 0.44–1.00)
GFR calc Af Amer: >60 mL/min (ref >60)
GFR calc non Af Amer: 56 mL/min — ABNORMAL LOW (ref >60)
Glucose, Bld: 156 mg/dL — ABNORMAL HIGH (ref 70–99)
POTASSIUM: 4.1 mmol/L (ref 3.5–5.1)
SODIUM: 141 mmol/L (ref 135–145)

## 2017-10-08 LAB — POCT INR: INR: 3 (ref 2.0–3.0)

## 2017-10-08 NOTE — Patient Instructions (Addendum)
Description   Continue same dose of coumadin  1 tablet daily except 1/2 tablet on Sundays and Wednesdays Recheck in 2 weeks

## 2017-10-14 ENCOUNTER — Telehealth: Payer: Self-pay | Admitting: *Deleted

## 2017-10-14 NOTE — Telephone Encounter (Signed)
-----   Message from Arnoldo Lenis, MD sent at 10/14/2017 12:39 PM EDT ----- Labs look good  Zandra Abts MD

## 2017-10-14 NOTE — Telephone Encounter (Signed)
LM with normal results on cell (per DPR) - routed to pcp

## 2017-10-22 ENCOUNTER — Ambulatory Visit (INDEPENDENT_AMBULATORY_CARE_PROVIDER_SITE_OTHER): Payer: Medicare Other | Admitting: *Deleted

## 2017-10-22 DIAGNOSIS — H43813 Vitreous degeneration, bilateral: Secondary | ICD-10-CM | POA: Diagnosis not present

## 2017-10-22 DIAGNOSIS — I4891 Unspecified atrial fibrillation: Secondary | ICD-10-CM | POA: Diagnosis not present

## 2017-10-22 DIAGNOSIS — H353212 Exudative age-related macular degeneration, right eye, with inactive choroidal neovascularization: Secondary | ICD-10-CM | POA: Diagnosis not present

## 2017-10-22 DIAGNOSIS — H353221 Exudative age-related macular degeneration, left eye, with active choroidal neovascularization: Secondary | ICD-10-CM | POA: Diagnosis not present

## 2017-10-22 DIAGNOSIS — Z8673 Personal history of transient ischemic attack (TIA), and cerebral infarction without residual deficits: Secondary | ICD-10-CM | POA: Diagnosis not present

## 2017-10-22 DIAGNOSIS — Z5181 Encounter for therapeutic drug level monitoring: Secondary | ICD-10-CM

## 2017-10-22 DIAGNOSIS — H53413 Scotoma involving central area, bilateral: Secondary | ICD-10-CM | POA: Diagnosis not present

## 2017-10-22 LAB — POCT INR: INR: 3.4 — AB (ref 2.0–3.0)

## 2017-10-22 NOTE — Patient Instructions (Signed)
Decrease coumadin to 1 tablet daily except 1/2 tablet on Mondays, Wednesdays and Friday Recheck in 3 weeks

## 2017-10-25 ENCOUNTER — Telehealth: Payer: Self-pay

## 2017-10-25 DIAGNOSIS — G451 Carotid artery syndrome (hemispheric): Secondary | ICD-10-CM

## 2017-10-25 NOTE — Telephone Encounter (Addendum)
Called Pt, LMOVM advising her of carotid results, and to have recheck prior to 07/2018 appt.

## 2017-10-25 NOTE — Telephone Encounter (Signed)
-----   Message from Pieter Partridge, DO sent at 10/08/2017  1:31 PM EDT ----- Carotid ultrasound shows no progression of carotid artery narrowing.  Repeat test about a week prior to follow up next May.

## 2017-11-12 ENCOUNTER — Ambulatory Visit (INDEPENDENT_AMBULATORY_CARE_PROVIDER_SITE_OTHER): Payer: Medicare Other | Admitting: Pharmacist

## 2017-11-12 ENCOUNTER — Encounter: Payer: Self-pay | Admitting: Internal Medicine

## 2017-11-12 DIAGNOSIS — Z8673 Personal history of transient ischemic attack (TIA), and cerebral infarction without residual deficits: Secondary | ICD-10-CM

## 2017-11-12 DIAGNOSIS — Z5181 Encounter for therapeutic drug level monitoring: Secondary | ICD-10-CM

## 2017-11-12 DIAGNOSIS — I4891 Unspecified atrial fibrillation: Secondary | ICD-10-CM | POA: Diagnosis not present

## 2017-11-12 LAB — POCT INR: INR: 1.8 — AB (ref 2.0–3.0)

## 2017-11-12 NOTE — Patient Instructions (Signed)
Description   Today take an extra 1/2 tablet (since already took 1 tablet today) then start taking 1 tablet daily except 1/2 tablet on Sundays and Wednesdays. Recheck in 3 weeks

## 2017-11-13 ENCOUNTER — Encounter: Payer: Self-pay | Admitting: Internal Medicine

## 2017-11-20 ENCOUNTER — Other Ambulatory Visit: Payer: Self-pay | Admitting: Internal Medicine

## 2017-12-26 ENCOUNTER — Encounter: Payer: Self-pay | Admitting: Internal Medicine

## 2017-12-26 ENCOUNTER — Other Ambulatory Visit (HOSPITAL_COMMUNITY)
Admission: RE | Admit: 2017-12-26 | Discharge: 2017-12-26 | Disposition: A | Discharge status: Home / Self Care | Payer: Medicare Other | Source: Ambulatory Visit | Attending: Internal Medicine | Admitting: Internal Medicine

## 2017-12-26 ENCOUNTER — Ambulatory Visit (INDEPENDENT_AMBULATORY_CARE_PROVIDER_SITE_OTHER): Payer: Medicare Other | Admitting: Pharmacist

## 2017-12-26 DIAGNOSIS — Z8673 Personal history of transient ischemic attack (TIA), and cerebral infarction without residual deficits: Secondary | ICD-10-CM | POA: Diagnosis not present

## 2017-12-26 DIAGNOSIS — I4891 Unspecified atrial fibrillation: Secondary | ICD-10-CM | POA: Diagnosis not present

## 2017-12-26 DIAGNOSIS — Z23 Encounter for immunization: Secondary | ICD-10-CM | POA: Diagnosis not present

## 2017-12-26 DIAGNOSIS — Z5181 Encounter for therapeutic drug level monitoring: Secondary | ICD-10-CM

## 2017-12-26 LAB — POCT INR: INR: 1.8 — AB (ref 2.0–3.0)

## 2017-12-26 NOTE — Patient Instructions (Addendum)
Description   Today take an extra 1/2 tablet (since already took 1 tablet today) then change dose to 1 tablet daily except 1/2 tablet on Sundays. Recheck in 2 weeks

## 2018-01-03 ENCOUNTER — Encounter (HOSPITAL_COMMUNITY): Payer: Self-pay | Admitting: Physical Therapy

## 2018-01-03 NOTE — Therapy (Signed)
[place], [place], [address] Phone: [phone]   Fax:  [phone]  Patient Details  Name: Jeanette Yates MRN: [id number] Date of Birth: [birth year] Referring Provider:  Hollace Kinnier  Encounter Date: 01/03/2018   PHYSICAL THERAPY DISCHARGE SUMMARY  Visits from Start of Care: 6  Current functional level related to goals / functional outcomes: Unknown due to pt not returning since last treatment    Remaining deficits: Unknown due to pt not returning since last treatment    Education / Equipment: HEP Plan: Patient agrees to discharge.  Patient goals were partially met. Patient is being discharged due to not returning since the last visit.  Rayetta Humphrey, PT CLT [phone]  ?????     Rayetta Humphrey, PT CLT [phone] 01/03/2018, 11:14 AM  [place], [place], [address] Phone: [phone]   Fax:  [phone]

## 2018-01-06 ENCOUNTER — Other Ambulatory Visit (HOSPITAL_COMMUNITY)
Admission: RE | Admit: 2018-01-06 | Discharge: 2018-01-06 | Disposition: A | Discharge status: Home / Self Care | Payer: Medicare Other | Source: Other Acute Inpatient Hospital | Attending: Internal Medicine | Admitting: Internal Medicine

## 2018-01-06 DIAGNOSIS — I480 Paroxysmal atrial fibrillation: Secondary | ICD-10-CM | POA: Diagnosis not present

## 2018-01-06 DIAGNOSIS — Z6830 Body mass index (BMI) 30.0-30.9, adult: Secondary | ICD-10-CM | POA: Diagnosis not present

## 2018-01-06 DIAGNOSIS — E1169 Type 2 diabetes mellitus with other specified complication: Secondary | ICD-10-CM | POA: Diagnosis not present

## 2018-01-06 DIAGNOSIS — R490 Dysphonia: Secondary | ICD-10-CM | POA: Diagnosis not present

## 2018-01-06 LAB — HEMOGLOBIN A1C
Hgb A1c MFr Bld: 7.2 % — ABNORMAL HIGH (ref 4.8–5.6)
Mean Plasma Glucose: 159.94 mg/dL

## 2018-01-06 LAB — CBC WITH DIFFERENTIAL/PLATELET
Abs Immature Granulocytes: 0.03 10*3/uL (ref 0.00–0.07)
Basophils Absolute: 0.1 10*3/uL (ref 0.0–0.1)
Basophils Relative: 1 %
Eosinophils Absolute: 0.7 10*3/uL — ABNORMAL HIGH (ref 0.0–0.5)
Eosinophils Relative: 7 %
HCT: 39.2 % (ref 36.0–46.0)
Hemoglobin: 12.3 g/dL (ref 12.0–15.0)
Immature Granulocytes: 0 %
Lymphocytes Relative: 18 %
Lymphs Abs: 1.8 10*3/uL (ref 0.7–4.0)
MCH: 27.8 pg (ref 26.0–34.0)
MCHC: 31.4 g/dL (ref 30.0–36.0)
MCV: 88.7 fL (ref 80.0–100.0)
Monocytes Absolute: 0.8 10*3/uL (ref 0.1–1.0)
Monocytes Relative: 8 %
Neutro Abs: 6.5 10*3/uL (ref 1.7–7.7)
Neutrophils Relative %: 66 %
Platelets: 395 10*3/uL (ref 150–400)
RBC: 4.42 MIL/uL (ref 3.87–5.11)
RDW: 13.2 % (ref 11.5–15.5)
WBC: 9.9 10*3/uL (ref 4.0–10.5)
nRBC: 0 % (ref 0.0–0.2)

## 2018-01-06 LAB — LIPID PANEL
Cholesterol: 131 mg/dL (ref 0–200)
HDL: 35 mg/dL — ABNORMAL LOW
LDL Cholesterol: 61 mg/dL (ref 0–99)
Total CHOL/HDL Ratio: 3.7 ratio
Triglycerides: 173 mg/dL — ABNORMAL HIGH
VLDL: 35 mg/dL (ref 0–40)

## 2018-01-06 LAB — COMPREHENSIVE METABOLIC PANEL
ALT: 17 U/L (ref 0–44)
AST: 21 U/L (ref 15–41)
Albumin: 3.9 g/dL (ref 3.5–5.0)
Alkaline Phosphatase: 79 U/L (ref 38–126)
Anion gap: 6 (ref 5–15)
BUN: 19 mg/dL (ref 8–23)
CO2: 28 mmol/L (ref 22–32)
Calcium: 9.5 mg/dL (ref 8.9–10.3)
Chloride: 106 mmol/L (ref 98–111)
Creatinine, Ser: 0.79 mg/dL (ref 0.44–1.00)
GFR calc Af Amer: >60 mL/min (ref >60)
GFR calc non Af Amer: >60 mL/min (ref >60)
Glucose, Bld: 124 mg/dL — ABNORMAL HIGH (ref 70–99)
Potassium: 4.4 mmol/L (ref 3.5–5.1)
Sodium: 140 mmol/L (ref 135–145)
Total Bilirubin: 0.7 mg/dL (ref 0.3–1.2)
Total Protein: 7.5 g/dL (ref 6.5–8.1)

## 2018-01-09 ENCOUNTER — Ambulatory Visit (INDEPENDENT_AMBULATORY_CARE_PROVIDER_SITE_OTHER): Payer: Medicare Other

## 2018-01-09 ENCOUNTER — Ambulatory Visit (INDEPENDENT_AMBULATORY_CARE_PROVIDER_SITE_OTHER): Payer: Medicare Other | Admitting: *Deleted

## 2018-01-09 ENCOUNTER — Encounter: Payer: Self-pay | Admitting: Internal Medicine

## 2018-01-09 ENCOUNTER — Ambulatory Visit (INDEPENDENT_AMBULATORY_CARE_PROVIDER_SITE_OTHER): Payer: Medicare Other | Admitting: Internal Medicine

## 2018-01-09 VITALS — BP 140/70 | HR 63 | Temp 98.3°F | Ht 62.0 in | Wt 167.0 lb

## 2018-01-09 VITALS — BP 140/62 | HR 63 | Temp 98.3°F | Ht 62.0 in | Wt 167.0 lb

## 2018-01-09 DIAGNOSIS — Z6830 Body mass index (BMI) 30.0-30.9, adult: Secondary | ICD-10-CM | POA: Diagnosis not present

## 2018-01-09 DIAGNOSIS — E6609 Other obesity due to excess calories: Secondary | ICD-10-CM

## 2018-01-09 DIAGNOSIS — Z0000 Encounter for general adult medical examination without abnormal findings: Secondary | ICD-10-CM

## 2018-01-09 DIAGNOSIS — Z5181 Encounter for therapeutic drug level monitoring: Secondary | ICD-10-CM

## 2018-01-09 DIAGNOSIS — Z8673 Personal history of transient ischemic attack (TIA), and cerebral infarction without residual deficits: Secondary | ICD-10-CM

## 2018-01-09 DIAGNOSIS — I6521 Occlusion and stenosis of right carotid artery: Secondary | ICD-10-CM

## 2018-01-09 DIAGNOSIS — I5032 Chronic diastolic (congestive) heart failure: Secondary | ICD-10-CM

## 2018-01-09 DIAGNOSIS — E1169 Type 2 diabetes mellitus with other specified complication: Secondary | ICD-10-CM | POA: Diagnosis not present

## 2018-01-09 DIAGNOSIS — M8580 Other specified disorders of bone density and structure, unspecified site: Secondary | ICD-10-CM | POA: Diagnosis not present

## 2018-01-09 DIAGNOSIS — M722 Plantar fascial fibromatosis: Secondary | ICD-10-CM

## 2018-01-09 DIAGNOSIS — I4891 Unspecified atrial fibrillation: Secondary | ICD-10-CM | POA: Diagnosis not present

## 2018-01-09 LAB — POCT INR: INR: 1.9 — AB (ref 2.0–3.0)

## 2018-01-09 MED ORDER — EMPAGLIFLOZIN 10 MG PO TABS: 10.0000 mg | ORAL_TABLET | Freq: Every day | ORAL | 3 refills | Status: DC

## 2018-01-09 MED ORDER — VITAMIN D (ERGOCALCIFEROL) 1.25 MG (50000 UNIT) PO CAPS: 50000.0000 [IU] | ORAL_CAPSULE | ORAL | 3 refills | Status: DC

## 2018-01-09 NOTE — Progress Notes (Signed)
Subjective:   Jeanette Yates is a 77 y.o. female who presents for Medicare Annual (Subsequent) preventive examination.  Last AWV-10/04/2016    Objective:     Vitals: BP 140/62 (BP Location: Left Arm, Patient Position: Sitting)   Pulse 63   Temp 98.3 F (36.8 C) (Oral)   Ht 5\' 2"  (1.575 m)   Wt 167 lb (75.8 kg)   SpO2 97%   BMI 30.54 kg/m   Body mass index is 30.54 kg/m.  Advanced Directives 01/09/2018 09/19/2017 09/05/2017 02/11/2017 01/07/2017 01/03/2017 10/16/2016  Does Patient Have a Medical Advance Directive? Yes Yes Yes Yes Yes Yes Yes  Type of Paramedic of [place];Living will [place];Living will [place];Living will [place];Living will Healthcare Power of [place] -  Does patient want to make changes to medical advance directive? No - Patient declined - No - Patient declined No - Patient declined - - No - Patient declined  Copy of [place] in Chart? Yes Yes Yes Yes No - copy requested No - copy requested -  Would patient like information on creating a medical advance directive? - - No - Patient declined - - - -    Tobacco Social History   Tobacco Use  Smoking Status Former Smoker  . Packs/day: 1.00  . Years: 20.00  . Pack years: 20.00  . Types: Cigarettes  . Last attempt to quit: 09/12/1966  . Years since quitting: 51.3  Smokeless Tobacco Never Used     Counseling given: Not Answered   Clinical Intake:  Pre-visit preparation completed: No  Pain : 0-10 Pain Score: 4  Pain Type: Chronic pain Pain Location: (sciatica) Pain Orientation: Left Pain Descriptors / Indicators: Aching Pain Onset: More than a month ago Pain Frequency: Intermittent     Diabetes: Yes CBG done?: No Did pt. bring in CBG monitor from home?: No  How often do you need to have someone help you when you read instructions, pamphlets, or other written  materials from your doctor or pharmacy?: 1 - Never What is the last grade level you completed in school?: Masters  Interpreter Needed?: No  Information entered by :: [name], RN  Past Medical History:  Diagnosis Date  . Adjustment disorder 10/17/2005  . Atrial fibrillation ([place])   . Cataract    bilateral  . CHF (congestive heart failure) ([place])   . Contracture of knee joint 07/26/2009  . Coronary artery disease   . Degeneration of lumbar or lumbosacral intervertebral disc 07/26/2009  . Diabetes mellitus without complication ([place])   . Esophageal spasm   . Esophageal spasm   . Fibromyalgia   . Fibromyalgia   . Hearing loss 12/18/2010  . Heart murmur   . Hypercholesteremia   . Hypertension   . 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 Leaky heart valve   . Lyme disease   . Macular degeneration   . Macular degeneration of both eyes   . Migraine 10/17/2005  . Mixed incontinence 10/17/2005  . Osteoarthritis    multiple joints   . Overweight 09/19/2010  . Pneumonia   . PONV (postoperative nausea and vomiting)   . Postartificial menopausal syndrome 10/10/2009  . Prinzmetal angina ([place]) 10/17/2005  . Psoriasis 06/12/2006  . Sciatica   . TIA (transient ischemic attack)   . Type II diabetes mellitus ([place])   . [name] 10/17/2005   Past Surgical History:  Procedure  Laterality Date  . ABDOMINAL HYSTERECTOMY    . AORTIC VALVE REPLACEMENT  12/10/2013   porcine  . CHOLECYSTECTOMY  1981  . COLONOSCOPY  04/18/2006   Dr. Nelva Nay: internal hemorrhoids  . COLONOSCOPY  10/2003   Dr. Alphonsa Gin: hemorrhoids  . CORONARY ARTERY BYPASS GRAFT  2015  . ESOPHAGOGASTRODUODENOSCOPY  10/16/2013   Dr. Marla Roe: prior Nissen fundoplication intact, hypertonic LES, dilated up to 69 [place] with moderate resistance, gastritis but no H. pylori., Reactive gastritis, no celiac disease.  . ESOPHAGOGASTRODUODENOSCOPY  05/29/2012   Dr. Doy Mince: Moderately severe  esophagitis, acute gastritis reactive, no H pylori, no celiac. Esophageal biopsies consistent with GERD, no Barrett  . ESOPHAGOGASTRODUODENOSCOPY  03/21/2009   Dr. Doy Mince: Reflux esophagitis, gastritis without H. pylori, esophagus stretched 57 savory  . ESOPHAGOGASTRODUODENOSCOPY  02/20/2008   Dr. Doy Mince: Esophagus dilated to 87 [place], reactive gastropathy with no H pylori. No Barrett's on esophageal biopsy  . ESOPHAGOGASTRODUODENOSCOPY  09/24/2006   Dr. Doy Mince: Tight wrap noted, reactive gastropathy, no Barrett's  . ESOPHAGOGASTRODUODENOSCOPY (EGD) WITH PROPOFOL N/A 01/09/2017   Procedure: ESOPHAGOGASTRODUODENOSCOPY (EGD) WITH PROPOFOL;  Surgeon: Daneil Dolin, MD;  [facility];  Service: Endoscopy;  Laterality: N/A;  2:15PM  . FRACTURE SURGERY Left    wrist  . JOINT REPLACEMENT    . MALONEY DILATION N/A 01/09/2017   Procedure: Venia Minks DILATION;  Surgeon: Daneil Dolin, MD;  Location: [facility];  Service: Endoscopy;  Laterality: N/A;  . MITRAL VALVE REPAIR  11/2013  . NISSEN FUNDOPLICATION  1941  . REPLACEMENT TOTAL KNEE BILATERAL  2010/2013   Family History  Problem Relation Age of Onset  . Stroke Mother   . Heart failure Mother   . Heart disease Mother   . Mitral valve prolapse Mother   . Diabetes Father   . Heart attack Father   . Stroke Father   . Heart disease Father   . Hypertension Father   . Alzheimer's disease Father   . Stroke Maternal Grandmother   . Arthritis Maternal Grandfather        hands  . Breast cancer Paternal Grandmother   . Osteoporosis Paternal Grandmother   . Breast cancer Cousin   . Colon cancer Neg Hx    Social History   Socioeconomic History  . Marital status: Widowed    Spouse name: Not on file  . Number of children: 3  . Years of education: Not on file  . Highest education level: Not on file  Occupational History  . Not on file  Social Needs  . Financial resource strain: Not hard at all  . Food insecurity:     Worry: Never true    Inability: Never true  . Transportation needs:    Medical: No    Non-medical: No  Tobacco Use  . Smoking status: Former Smoker    Packs/day: 1.00    Years: 20.00    Pack years: 20.00    Types: Cigarettes    Last attempt to quit: 09/12/1966    Years since quitting: 51.3  . Smokeless tobacco: Never Used  Substance and Sexual Activity  . Alcohol use: No  . Drug use: No  . Sexual activity: Never  Lifestyle  . Physical activity:    Days per week: 6 days    Minutes per session: 30 min  . Stress: Only a little  Relationships  . Social connections:    Talks on phone: More than three times a week    Gets  together: More than three times a week    Attends religious service: More than 4 times per year    Active member of club or organization: No    Attends meetings of clubs or organizations: Never    Relationship status: Widowed  Other Topics Concern  . Not on file  Social History Narrative  . Not on file    Outpatient Encounter Medications as of 01/09/2018  Medication Sig  . acetaminophen (TYLENOL) 650 MG CR tablet Take 650-1,300 mg by mouth every 8 (eight) hours as needed for pain.   Marland Kitchen aspirin EC 81 MG tablet Take 81 mg by mouth every morning.   Marland Kitchen atorvastatin (LIPITOR) 80 MG tablet TAKE 1 TABLET EVERY DAY  . Calcium Carb-Cholecalciferol (CALTRATE 600+D3 SOFT PO) Take 1 tablet by mouth 2 (two) times daily.   . cetirizine (ZYRTEC) 10 MG tablet Take 10 mg by mouth daily as needed for allergies.   . Cholecalciferol (VITAMIN D3) 2000 units TABS Take 2,000 Units by mouth daily.   . fluticasone (FLONASE) 50 MCG/ACT nasal spray Place 2 sprays into both nostrils daily.  . furosemide (LASIX) 20 MG tablet TAKE 1 TABLET (20 MG TOTAL) BY MOUTH EVERY MORNING AT 8 AM  . gabapentin (NEURONTIN) 100 MG capsule TAKE 1 CAPSULE  EVERY MORNING AND TAKE 2 TO 3 CAPSULES  AT BEDTIME  . loperamide (IMODIUM A-D) 2 MG tablet Take 2 mg by mouth as needed for diarrhea or loose stools.    . metFORMIN (GLUCOPHAGE) 500 MG tablet TAKE 1 TABLET TWICE DAILY WITH A MEAL AT 8AM AND 7PM  . metoprolol tartrate (LOPRESSOR) 25 MG tablet TAKE 1/2 TABLETS TWO TIMES DAILY AT 8 AM & 7 PM  . potassium chloride (K-DUR,KLOR-CON) 10 MEQ tablet TAKE 1 TABLET (10 MEQ TOTAL) BY MOUTH EVERY MORNING AT  8 AM  . warfarin (COUMADIN) 6 MG tablet TAKE 1 TABLET DAILY.  Marland Kitchen lisinopril (PRINIVIL,ZESTRIL) 10 MG tablet Take 1 tablet (10 mg total) by mouth daily.   No facility-administered encounter medications on file as of 01/09/2018.     Activities of Daily Living In your present state of health, do you have any difficulty performing the following activities: 01/09/2018  Hearing? Y  Vision? N  Difficulty concentrating or making decisions? Y  Walking or climbing stairs? N  Dressing or bathing? N  Doing errands, shopping? N  Preparing Food and eating ? N  Using the Toilet? N  In the past six months, have you accidently leaked urine? Y  Do you have problems with loss of bowel control? N  Managing your Medications? Y  Managing your Finances? Y  Housekeeping or managing your Housekeeping? Y  Some recent data might be hidden    Patient Care Team: Gayland Curry, DO as PCP - General (Geriatric Medicine) Harl Bowie Alphonse Guild, MD as PCP - Cardiology (Cardiology) Gala Romney Cristopher Estimable, MD as Consulting Physician (Gastroenterology)    Assessment:   This is a routine wellness examination for Jeanette Yates.  Exercise Activities and Dietary recommendations Current Exercise Habits: Home exercise routine, Type of exercise: walking;strength training/weights, Time (Minutes): 30, Frequency (Times/Week): 6, Weekly Exercise (Minutes/Week): 180, Intensity: Mild, Exercise limited by: None identified  Goals    . Plan meals     Pt will see a dietician to put together meals.       Fall Risk Fall Risk  01/09/2018 09/19/2017 09/05/2017 07/25/2017 07/05/2017  Falls in the past year? No No No No Yes  Number falls in past yr: - - - -  2 or more  Injury with Fall? - - - - Yes  Risk for fall due to : - - - - -   Is the patient's home free of loose throw rugs in walkways, pet beds, electrical cords, etc?   yes      Grab bars in the bathroom? yes      Handrails on the stairs?   yes      Adequate lighting?   yes  Depression Screen PHQ 2/9 Scores 01/09/2018 09/05/2017 02/11/2017 12/17/2016  PHQ - 2 Score 1 0 0 0     Cognitive Function MMSE - Mini Mental State Exam 01/09/2018 10/04/2016 12/06/2015  Not completed: (No Data) - -  Orientation to time 4 5 5   Orientation to Place 5 5 4   Registration 3 3 3   Attention/ Calculation 3 5 5   Recall 3 2 3   Language- name 2 objects 2 2 2   Language- repeat 1 1 1   Language- follow 3 step command 3 2 3   Language- read & follow direction 1 1 1   Write a sentence 1 1 1   Copy design 0 1 0  Copy design-comments - - Patient has poor visual acuity.  Total score 26 28 28         Immunization History  Administered Date(s) Administered  . Influenza Split 12/23/2003, 01/15/2005, 03/11/2007, 11/24/2013  . Influenza, High Dose Seasonal PF 12/26/2017  . Influenza,inj,Quad PF,6+ Mos 12/06/2015  . Influenza,inj,quad, With Preservative 12/06/2015  . Influenza-Unspecified 11/23/2014, 01/16/2017, 12/27/2017  . Pneumococcal Conjugate-13 11/01/2015  . Pneumococcal Polysaccharide-23 09/05/2017  . Tdap 08/15/2006, 10/12/2016    Qualifies for Shingles Vaccine? Yes, educated and declined due to cost  Screening Tests Health Maintenance  Topic Date Due  . URINE MICROALBUMIN  06/19/2016  . OPHTHALMOLOGY EXAM  07/03/2018  . HEMOGLOBIN A1C  07/08/2018  . FOOT EXAM  09/06/2018  . COLONOSCOPY  03/26/2022  . TETANUS/TDAP  10/13/2026  . INFLUENZA VACCINE  Completed  . DEXA SCAN  Completed  . PNA vac Low Risk Adult  Completed    Cancer Screenings: Lung: Low Dose CT Chest recommended if Age 23-80 years, 30 pack-year currently smoking OR have quit w/in 15years. Patient does not qualify. Breast:   Up to date on Mammogram? Yes   Up to date of Bone Density/Dexa? Yes Colorectal: up to date  Additional Screenings:  Hepatitis C Screening: declined     Plan:    I have personally reviewed and addressed the Medicare Annual Wellness questionnaire and have noted the following in the patient's chart:  A. Medical and social history B. Use of alcohol, tobacco or illicit drugs  C. Current medications and supplements D. Functional ability and status E.  Nutritional status F.  Physical activity G. Advance directives H. List of other physicians I.  Hospitalizations, surgeries, and ER visits in previous 12 months J.  [place] to include hearing, vision, cognitive, depression L. Referrals and appointments - none  In addition, I have reviewed and discussed with patient certain preventive protocols, quality metrics, and best practice recommendations. A written personalized care plan for preventive services as well as general preventive health recommendations were provided to patient.  See attached scanned questionnaire for additional information.   Signed,   [name], RN Nurse Health Advisor  Patient Concerns: R foot ache- would like foot exam Would like to discuss possibly using trulicity Shoulder pain CBG high sometimes Possible weight loss clinic SOB with mild exertions sometimes

## 2018-01-09 NOTE — Patient Instructions (Signed)
Increase coumadin to 1 tablet daily Recheck in 2.5 weeks

## 2018-01-09 NOTE — Patient Instructions (Signed)

## 2018-01-09 NOTE — Progress Notes (Signed)
Location:  [facility] clinic Provider:  Erla Bacchi L. Mariea Clonts, D.O., C.M.D.  Code Status: full code Goals of Care:  Advanced Directives 01/09/2018  Does Patient Have a Medical Advance Directive? Yes  Type of Paramedic of [place];Living will  Does patient want to make changes to medical advance directive? No - Patient declined  Copy of [place] in Chart? Yes  Would patient like information on creating a medical advance directive? -     Chief Complaint  Patient presents with  . Medical Management of Chronic Issues    20mth follow-up    HPI: Patient is a 77 y.o. female seen today for medical management of chronic diseases.    BP high upon arrival.  Improved to 140/70.  Right foot ache:  Happening about a month.  Right hand side from front of heel to just before the toes.  Aches at night.  Sometimes if she steps on it a certain way, it will hurt.  When she has less supportive shoes, it hurts more.  More intense when she first gets up and when she goes to bed at night.  Going shoeless a lot lately.  Counseled on risks with feet.    DMII:  3 wks ago, she started a rollercoaster of sugars.  High at times--170stg in the morning, checking qid for a while.  Diet and exercise were discussed.  She was indulging at some celebrations she was going to.  Tries a little of everything.  She is to ask if it's diabetic friendly.  She began these changes after sugar was 253.  She was not feeling good.  She was watching the trulicity commercial.  She's been on metformin a long time.  hba1c had gone up.  They wonder about [facility].  Pt has concern about the cost.  Her BMI is 30.54.    Not taking her caltrate with D due to large size.  On for low bone density.  Is taking D3, but wants weekly version.  She's sob sometimes--usually if carrying things.  Is working out at [employer] but not working up a sweat there.  Worse if she's very bloated.  Worse if weight  up 3 lbs.  Not always bloated when it happens.  Does curves cycle--very little cardio.  Uses bicycle for 10 mins.  Does not like treadmill or elliptical.  Walks 1 mile every other day.  Only walking she gets.  Is up 5.2 lbs.  Not a big veggie eater or protein eater.  Just started water aerobics--likes it so may go back.    Got flu shot already.  Right shoulder is sore after she overdid it at the gym with her weights, but then also made her left sore by using it to compensate Past Medical History:  Diagnosis Date  . Adjustment disorder 10/17/2005  . Atrial fibrillation ([place])   . Cataract    bilateral  . CHF (congestive heart failure) ([place])   . Contracture of knee joint 07/26/2009  . Coronary artery disease   . Degeneration of lumbar or lumbosacral intervertebral disc 07/26/2009  . Diabetes mellitus without complication ([place])   . Esophageal spasm   . Esophageal spasm   . Fibromyalgia   . Fibromyalgia   . Hearing loss 12/18/2010  . Heart murmur   . Hypercholesteremia   . Hypertension   . 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 Leaky heart valve   . Lyme disease   .  Macular degeneration   . Macular degeneration of both eyes   . Migraine 10/17/2005  . Mixed incontinence 10/17/2005  . Osteoarthritis    multiple joints   . Overweight 09/19/2010  . Pneumonia   . PONV (postoperative nausea and vomiting)   . Postartificial menopausal syndrome 10/10/2009  . Prinzmetal angina ([place]) 10/17/2005  . Psoriasis 06/12/2006  . Sciatica   . TIA (transient ischemic attack)   . Type II diabetes mellitus ([place])   . [name] 10/17/2005    Past Surgical History:  Procedure Laterality Date  . ABDOMINAL HYSTERECTOMY    . AORTIC VALVE REPLACEMENT  12/10/2013   porcine  . CHOLECYSTECTOMY  1981  . COLONOSCOPY  04/18/2006   Dr. Nelva Nay: internal hemorrhoids  . COLONOSCOPY  10/2003   Dr. Alphonsa Gin: hemorrhoids  . CORONARY ARTERY BYPASS GRAFT  2015  .  ESOPHAGOGASTRODUODENOSCOPY  10/16/2013   Dr. Marla Roe: prior Nissen fundoplication intact, hypertonic LES, dilated up to 17 [place] with moderate resistance, gastritis but no H. pylori., Reactive gastritis, no celiac disease.  . ESOPHAGOGASTRODUODENOSCOPY  05/29/2012   Dr. Doy Mince: Moderately severe esophagitis, acute gastritis reactive, no H pylori, no celiac. Esophageal biopsies consistent with GERD, no Barrett  . ESOPHAGOGASTRODUODENOSCOPY  03/21/2009   Dr. Doy Mince: Reflux esophagitis, gastritis without H. pylori, esophagus stretched 57 savory  . ESOPHAGOGASTRODUODENOSCOPY  02/20/2008   Dr. Doy Mince: Esophagus dilated to 62 French, reactive gastropathy with no H pylori. No Barrett's on esophageal biopsy  . ESOPHAGOGASTRODUODENOSCOPY  09/24/2006   Dr. Doy Mince: Tight wrap noted, reactive gastropathy, no Barrett's  . ESOPHAGOGASTRODUODENOSCOPY (EGD) WITH PROPOFOL N/A 01/09/2017   Procedure: ESOPHAGOGASTRODUODENOSCOPY (EGD) WITH PROPOFOL;  Surgeon: Daneil Dolin, MD;  [facility];  Service: Endoscopy;  Laterality: N/A;  2:15PM  . FRACTURE SURGERY Left    wrist  . JOINT REPLACEMENT    . MALONEY DILATION N/A 01/09/2017   Procedure: Venia Minks DILATION;  Surgeon: Daneil Dolin, MD;  [facility]: [facility];  Service: Endoscopy;  Laterality: N/A;  . MITRAL VALVE REPAIR  11/2013  . NISSEN FUNDOPLICATION  6967  . REPLACEMENT TOTAL KNEE BILATERAL  2010/2013    Allergies  Allergen Reactions  . Penicillins Anaphylaxis and Other (See Comments)    Has patient had a PCN reaction causing immediate rash, facial/tongue/throat swelling, SOB or lightheadedness with hypotension: Yes Has patient had a PCN reaction causing severe rash involving mucus membranes or skin necrosis: Yes Has patient had a PCN reaction that required hospitalization Yes Has patient had a PCN reaction occurring within the last 10 years: No If all of the above answers are "NO", then may proceed with Cephalosporin  use.   . Latex Other (See Comments)    Redness and rash  . Morphine And Related Nausea Only and Other (See Comments)    Dizziness  . Tape Other (See Comments)    Redness and rash  . Iodine Other (See Comments)    Redness and rash    Outpatient Encounter Medications as of 01/09/2018  Medication Sig  . acetaminophen (TYLENOL) 650 MG CR tablet Take 650-1,300 mg by mouth every 8 (eight) hours as needed for pain.   Marland Kitchen aspirin EC 81 MG tablet Take 81 mg by mouth every morning.   Marland Kitchen atorvastatin (LIPITOR) 80 MG tablet TAKE 1 TABLET EVERY DAY  . Calcium Carb-Cholecalciferol (CALTRATE 600+D3 SOFT PO) Take 1 tablet by mouth 2 (two) times daily.   . cetirizine (ZYRTEC) 10 MG tablet Take 10 mg by mouth daily as needed  for allergies.   . Cholecalciferol (VITAMIN D3) 2000 units TABS Take 2,000 Units by mouth daily.   . fluticasone (FLONASE) 50 MCG/ACT nasal spray Place 2 sprays into both nostrils daily.  . furosemide (LASIX) 20 MG tablet TAKE 1 TABLET (20 MG TOTAL) BY MOUTH EVERY MORNING AT 8 AM  . gabapentin (NEURONTIN) 100 MG capsule TAKE 1 CAPSULE  EVERY MORNING AND TAKE 2 TO 3 CAPSULES  AT BEDTIME  . loperamide (IMODIUM A-D) 2 MG tablet Take 2 mg by mouth as needed for diarrhea or loose stools.  . metFORMIN (GLUCOPHAGE) 500 MG tablet TAKE 1 TABLET TWICE DAILY WITH A MEAL AT 8AM AND 7PM  . metoprolol tartrate (LOPRESSOR) 25 MG tablet TAKE 1/2 TABLETS TWO TIMES DAILY AT 8 AM & 7 PM  . potassium chloride (K-DUR,KLOR-CON) 10 MEQ tablet TAKE 1 TABLET (10 MEQ TOTAL) BY MOUTH EVERY MORNING AT  8 AM  . warfarin (COUMADIN) 6 MG tablet TAKE 1 TABLET DAILY.  Marland Kitchen lisinopril (PRINIVIL,ZESTRIL) 10 MG tablet Take 1 tablet (10 mg total) by mouth daily.   No facility-administered encounter medications on file as of 01/09/2018.     Review of Systems:  Review of Systems  Constitutional: Negative for chills, fever and malaise/fatigue.  HENT: Negative for congestion and hearing loss.   Eyes: Negative for blurred  vision.  Respiratory: Positive for shortness of breath. Negative for cough, sputum production and wheezing.   Cardiovascular: Negative for chest pain, palpitations and leg swelling.  Gastrointestinal: Negative for abdominal pain, blood in stool, constipation, diarrhea and melena.  Genitourinary: Negative for dysuria.  Musculoskeletal: Positive for joint pain and myalgias. Negative for falls.  Neurological: Negative for dizziness.  Endo/Heme/Allergies: Does not bruise/bleed easily.  Psychiatric/Behavioral: Negative for depression and memory loss. The patient is not nervous/anxious and does not have insomnia.     Health Maintenance  Topic Date Due  . URINE MICROALBUMIN  06/19/2016  . OPHTHALMOLOGY EXAM  07/03/2018  . HEMOGLOBIN A1C  07/08/2018  . FOOT EXAM  09/06/2018  . COLONOSCOPY  03/26/2022  . TETANUS/TDAP  10/13/2026  . INFLUENZA VACCINE  Completed  . DEXA SCAN  Completed  . PNA vac Low Risk Adult  Completed    Physical Exam: Vitals:   01/09/18 1514  BP: (!) 152/68  Pulse: 63  Temp: 98.3 F (36.8 C)  TempSrc: Oral  SpO2: 97%  Weight: 167 lb (75.8 kg)  Height: 5\' 2"  (1.575 m)   Body mass index is 30.54 kg/m. Physical Exam  Constitutional: She is oriented to person, place, and time. She appears well-developed and well-nourished.  HENT:  Head: Normocephalic and atraumatic.  Cardiovascular: Normal rate, regular rhythm and intact distal pulses.  Murmur heard. Pulmonary/Chest: Effort normal and breath sounds normal. No respiratory distress.  Abdominal: Soft. Bowel sounds are normal. She exhibits no distension. There is no tenderness.  Musculoskeletal: Normal range of motion. She exhibits tenderness.  Over right shoulder, negative drop arm test; tenderness right lateral heel posterior to arch  Neurological: She is alert and oriented to person, place, and time.  Skin: Skin is warm and dry.  Psychiatric: She has a normal mood and affect.    Labs reviewed: Basic  Metabolic Panel: Recent Labs    09/03/17 0819 10/08/17 1206 01/06/18 0820  NA 140 141 140  K 4.4 4.1 4.4  CL 104 104 106  CO2 30 28 28   GLUCOSE 131* 156* 124*  BUN 24 23 19   CREATININE 0.77 0.97 0.79  CALCIUM 9.3 9.4 9.5  Liver Function Tests: Recent Labs    01/06/18 0820  AST 21  ALT 17  ALKPHOS 79  BILITOT 0.7  PROT 7.5  ALBUMIN 3.9   No results for input(s): LIPASE, AMYLASE in the last 8760 hours. No results for input(s): AMMONIA in the last 8760 hours. CBC: Recent Labs    09/03/17 0819 01/06/18 0820  WBC 7.7 9.9  NEUTROABS 4,766 6.5  HGB 12.4 12.3  HCT 36.3 39.2  MCV 85.8 88.7  PLT 351 395   Lipid Panel: Recent Labs    09/03/17 0819 01/06/18 0820  CHOL 148 131  HDL 43* 35*  LDLCALC 75 61  TRIG 199* 173*  CHOLHDL 3.4 3.7   Lab Results  Component Value Date   HGBA1C 7.2 (H) 01/06/2018     Assessment/Plan 1. Type 2 diabetes mellitus with other specified complication, without long-term current use of insulin (HCC) - cont metformin, add jardiance - empagliflozin (JARDIANCE) 10 MG TABS tablet; Take 10 mg by mouth daily.  Dispense: 90 tablet; Refill: 3 - Amb Ref to Medical Weight Management at pt and daughter's request - does need to lose weight to help with deconditioning, sob, and to lower sugar - Hemoglobin A1c; Future - Lipid panel; Future  2. Senile osteopenia - d/c calcium due to difficulty swallowing it, wants fewer pills so will change to weekly vitamin D instead of daily - Vitamin D, Ergocalciferol, (DRISDOL) 50000 units CAPS capsule; Take 1 capsule (50,000 Units total) by mouth every 7 (seven) days.  Dispense: 12 capsule; Refill: 3  3. Class 1 obesity due to excess calories with serious comorbidity and body mass index (BMI) of 30.0 to 30.9 in adult - gained weight since last visit, makes poor food choices especially for a diabetic patient, agrees to see [facility] weight management -will add jardiance to metformin which should also help - Amb  Ref to Medical Weight Management - CBC with Differential/Platelet; Future - COMPLETE METABOLIC PANEL WITH GFR; Future  4. Body mass index (BMI) of 30.0-30.9 in adult - counseled on diet and exercise--increase cardio - Amb Ref to Medical Weight Management - CBC with Differential/Platelet; Future - COMPLETE METABOLIC PANEL WITH GFR; Future  5. Chronic diastolic congestive heart failure (HCC) - does not have evidence of volume overload, appears weight gain is foot not fluid - COMPLETE METABOLIC PANEL WITH GFR; Future  6. Plantar fasciitis of right foot -given exercises to do with tennis ball and stretches, recommended she go to biotech for diabetic shoes and I can do the Rx for that  Labs/tests ordered:   Orders Placed This Encounter  Procedures  . CBC with Differential/Platelet    Standing Status:   Future    Standing Expiration Date:   01/10/2019  . COMPLETE METABOLIC PANEL WITH GFR    Standing Status:   Future    Standing Expiration Date:   01/10/2019  . Hemoglobin A1c    Standing Status:   Future    Standing Expiration Date:   01/10/2019  . Lipid panel    Standing Status:   Future    Standing Expiration Date:   01/10/2019  . Amb Ref to Medical Weight Management    Referral Priority:   Routine    Referral Type:   Consultation    Number of Visits Requested:   1   Next appt:  05/15/2018 med mgt, fasting labs before   Cass[place]. Etta Gassett, D.O. [facility] [address], [place] [address]  Cell Phone (Mon-Fri 8am-5pm):  [phone] On Call:  [phone] & follow prompts after 5pm & weekends Office Phone:  [phone] Office Fax:  [phone]

## 2018-01-09 NOTE — Patient Instructions (Signed)
[name] , Thank you for taking time to come for your Medicare Wellness Visit. I appreciate your ongoing commitment to your health goals. Please review the following plan we discussed and let me know if I can assist you in the future.   Screening recommendations/referrals: Colonoscopy excluded, age 77 Mammogram excluded, age 36 Bone Density up to date Recommended yearly ophthalmology/optometry visit for glaucoma screening and checkup Recommended yearly dental visit for hygiene and checkup  Vaccinations: Influenza vaccine up to date Pneumococcal vaccine up to date, completed Tdap vaccine up to date, due 10/13/2026 Shingles vaccine due    Advanced directives: in chart  Conditions/risks identified: none  Next appointment: Jeanette Dense, RN 01/14/2019 @ 10am   Preventive Care 65 Years and Older, Female Preventive care refers to lifestyle choices and visits with your health care provider that can promote health and wellness. What does preventive care include?  A yearly physical exam. This is also called an annual well check.  Dental exams once or twice a year.  Routine eye exams. Ask your health care provider how often you should have your eyes checked.  Personal lifestyle choices, including:  Daily care of your teeth and gums.  Regular physical activity.  Eating a healthy diet.  Avoiding tobacco and drug use.  Limiting alcohol use.  Practicing safe sex.  Taking low-dose aspirin every day.  Taking vitamin and mineral supplements as recommended by your health care provider. What happens during an annual well check? The services and screenings done by your health care provider during your annual well check will depend on your age, overall health, lifestyle risk factors, and family history of disease. Counseling  Your health care provider may ask you questions about your:  Alcohol use.  Tobacco use.  Drug use.  Emotional well-being.  Home and relationship  well-being.  Sexual activity.  Eating habits.  History of falls.  Memory and ability to understand (cognition).  Work and work Statistician.  Reproductive health. Screening  You may have the following tests or measurements:  Height, weight, and BMI.  Blood pressure.  Lipid and cholesterol levels. These may be checked every 5 years, or more frequently if you are over 87 years old.  Skin check.  Lung cancer screening. You may have this screening every year starting at age 73 if you have a 30-pack-year history of smoking and currently smoke or have quit within the past 15 years.  Fecal occult blood test (FOBT) of the stool. You may have this test every year starting at age 64.  Flexible sigmoidoscopy or colonoscopy. You may have a sigmoidoscopy every 5 years or a colonoscopy every 10 years starting at age 4.  Hepatitis C blood test.  Hepatitis B blood test.  Sexually transmitted disease (STD) testing.  Diabetes screening. This is done by checking your blood sugar (glucose) after you have not eaten for a while (fasting). You may have this done every 1-3 years.  Bone density scan. This is done to screen for osteoporosis. You may have this done starting at age 22.  Mammogram. This may be done every 1-2 years. Talk to your health care provider about how often you should have regular mammograms. Talk with your health care provider about your test results, treatment options, and if necessary, the need for more tests. Vaccines  Your health care provider may recommend certain vaccines, such as:  Influenza vaccine. This is recommended every year.  Tetanus, diphtheria, and acellular pertussis (Tdap, Td) vaccine. You may need a Td  booster every 10 years.  Zoster vaccine. You may need this after age 55.  Pneumococcal 13-valent conjugate (PCV13) vaccine. One dose is recommended after age 75.  Pneumococcal polysaccharide (PPSV23) vaccine. One dose is recommended after age  39. Talk to your health care provider about which screenings and vaccines you need and how often you need them. This information is not intended to replace advice given to you by your health care provider. Make sure you discuss any questions you have with your health care provider. Document Released: 04/08/2015 Document Revised: 11/30/2015 Document Reviewed: 01/11/2015 Elsevier Interactive Patient Education  2017 [place] Prevention in the Home Falls can cause injuries. They can happen to people of all ages. There are many things you can do to make your home safe and to help prevent falls. What can I do on the outside of my home?  Regularly fix the edges of walkways and driveways and fix any cracks.  Remove anything that might make you trip as you walk through a door, such as a raised step or threshold.  Trim any bushes or trees on the path to your home.  Use bright outdoor lighting.  Clear any walking paths of anything that might make someone trip, such as rocks or tools.  Regularly check to see if handrails are loose or broken. Make sure that both sides of any steps have handrails.  Any raised decks and porches should have guardrails on the edges.  Have any leaves, snow, or ice cleared regularly.  Use sand or salt on walking paths during winter.  Clean up any spills in your garage right away. This includes oil or grease spills. What can I do in the bathroom?  Use night lights.  Install grab bars by the toilet and in the tub and shower. Do not use towel bars as grab bars.  Use non-skid mats or decals in the tub or shower.  If you need to sit down in the shower, use a plastic, non-slip stool.  Keep the floor dry. Clean up any water that spills on the floor as soon as it happens.  Remove soap buildup in the tub or shower regularly.  Attach bath mats securely with double-sided non-slip rug tape.  Do not have throw rugs and other things on the floor that can make  you trip. What can I do in the bedroom?  Use night lights.  Make sure that you have a light by your bed that is easy to reach.  Do not use any sheets or blankets that are too big for your bed. They should not hang down onto the floor.  Have a firm chair that has side arms. You can use this for support while you get dressed.  Do not have throw rugs and other things on the floor that can make you trip. What can I do in the kitchen?  Clean up any spills right away.  Avoid walking on wet floors.  Keep items that you use a lot in easy-to-reach places.  If you need to reach something above you, use a strong step stool that has a grab bar.  Keep electrical cords out of the way.  Do not use floor polish or wax that makes floors slippery. If you must use wax, use non-skid floor wax.  Do not have throw rugs and other things on the floor that can make you trip. What can I do with my stairs?  Do not leave any items on the stairs.  Make sure that there are handrails on both sides of the stairs and use them. Fix handrails that are broken or loose. Make sure that handrails are as long as the stairways.  Check any carpeting to make sure that it is firmly attached to the stairs. Fix any carpet that is loose or worn.  Avoid having throw rugs at the top or bottom of the stairs. If you do have throw rugs, attach them to the floor with carpet tape.  Make sure that you have a light switch at the top of the stairs and the bottom of the stairs. If you do not have them, ask someone to add them for you. What else can I do to help prevent falls?  Wear shoes that:  Do not have high heels.  Have rubber bottoms.  Are comfortable and fit you well.  Are closed at the toe. Do not wear sandals.  If you use a stepladder:  Make sure that it is fully opened. Do not climb a closed stepladder.  Make sure that both sides of the stepladder are locked into place.  Ask someone to hold it for you, if  possible.  Clearly mark and make sure that you can see:  Any grab bars or handrails.  First and last steps.  Where the edge of each step is.  Use tools that help you move around (mobility aids) if they are needed. These include:  Canes.  Walkers.  Scooters.  Crutches.  Turn on the lights when you go into a dark area. Replace any light bulbs as soon as they burn out.  Set up your furniture so you have a clear path. Avoid moving your furniture around.  If any of your floors are uneven, fix them.  If there are any pets around you, be aware of where they are.  Review your medicines with your doctor. Some medicines can make you feel dizzy. This can increase your chance of falling. Ask your doctor what other things that you can do to help prevent falls. This information is not intended to replace advice given to you by your health care provider. Make sure you discuss any questions you have with your health care provider. Document Released: 01/06/2009 Document Revised: 08/18/2015 Document Reviewed: 04/16/2014 Elsevier Interactive Patient Education  2017 [employer].

## 2018-01-11 ENCOUNTER — Encounter: Payer: Self-pay | Admitting: Internal Medicine

## 2018-01-13 NOTE — Telephone Encounter (Signed)
Routed to Dr.Reed   S.Chrae B/CMA

## 2018-01-28 ENCOUNTER — Ambulatory Visit (INDEPENDENT_AMBULATORY_CARE_PROVIDER_SITE_OTHER): Payer: Medicare Other | Admitting: *Deleted

## 2018-01-28 DIAGNOSIS — H43813 Vitreous degeneration, bilateral: Secondary | ICD-10-CM | POA: Diagnosis not present

## 2018-01-28 DIAGNOSIS — Z8673 Personal history of transient ischemic attack (TIA), and cerebral infarction without residual deficits: Secondary | ICD-10-CM

## 2018-01-28 DIAGNOSIS — H53413 Scotoma involving central area, bilateral: Secondary | ICD-10-CM | POA: Diagnosis not present

## 2018-01-28 DIAGNOSIS — I4891 Unspecified atrial fibrillation: Secondary | ICD-10-CM | POA: Diagnosis not present

## 2018-01-28 DIAGNOSIS — H353212 Exudative age-related macular degeneration, right eye, with inactive choroidal neovascularization: Secondary | ICD-10-CM | POA: Diagnosis not present

## 2018-01-28 DIAGNOSIS — Z5181 Encounter for therapeutic drug level monitoring: Secondary | ICD-10-CM | POA: Diagnosis not present

## 2018-01-28 DIAGNOSIS — H353221 Exudative age-related macular degeneration, left eye, with active choroidal neovascularization: Secondary | ICD-10-CM | POA: Diagnosis not present

## 2018-01-28 LAB — POCT INR: INR: 3 (ref 2.0–3.0)

## 2018-01-28 NOTE — Patient Instructions (Signed)
Continue coumadin 1 tablet daily Recheck in 3 weeks 

## 2018-02-06 ENCOUNTER — Encounter: Payer: Self-pay | Admitting: Internal Medicine

## 2018-02-17 ENCOUNTER — Ambulatory Visit (INDEPENDENT_AMBULATORY_CARE_PROVIDER_SITE_OTHER): Payer: Medicare Other | Admitting: *Deleted

## 2018-02-17 DIAGNOSIS — Z8673 Personal history of transient ischemic attack (TIA), and cerebral infarction without residual deficits: Secondary | ICD-10-CM

## 2018-02-17 DIAGNOSIS — Z5181 Encounter for therapeutic drug level monitoring: Secondary | ICD-10-CM | POA: Diagnosis not present

## 2018-02-17 DIAGNOSIS — I4891 Unspecified atrial fibrillation: Secondary | ICD-10-CM | POA: Diagnosis not present

## 2018-02-17 LAB — POCT INR: INR: 2 (ref 2.0–3.0)

## 2018-02-17 NOTE — Patient Instructions (Signed)
Take coumadin extra 1/2 tablet today then resume 1 tablet daily Recheck in 4 weeks

## 2018-03-03 ENCOUNTER — Other Ambulatory Visit: Payer: Self-pay | Admitting: Internal Medicine

## 2018-03-03 ENCOUNTER — Other Ambulatory Visit: Payer: Self-pay | Admitting: Gastroenterology

## 2018-03-03 DIAGNOSIS — E118 Type 2 diabetes mellitus with unspecified complications: Secondary | ICD-10-CM

## 2018-03-03 DIAGNOSIS — J302 Other seasonal allergic rhinitis: Secondary | ICD-10-CM

## 2018-03-07 ENCOUNTER — Other Ambulatory Visit: Payer: Self-pay

## 2018-03-07 ENCOUNTER — Other Ambulatory Visit: Payer: Self-pay | Admitting: *Deleted

## 2018-03-07 MED ORDER — WARFARIN SODIUM 6 MG PO TABS: 6.0000 mg | ORAL_TABLET | Freq: Every day | ORAL | 0 refills | Status: DC

## 2018-03-12 ENCOUNTER — Encounter (INDEPENDENT_AMBULATORY_CARE_PROVIDER_SITE_OTHER): Payer: Medicare Other

## 2018-04-01 ENCOUNTER — Ambulatory Visit (INDEPENDENT_AMBULATORY_CARE_PROVIDER_SITE_OTHER): Payer: Medicare Other | Admitting: Family Medicine

## 2018-04-15 ENCOUNTER — Ambulatory Visit (INDEPENDENT_AMBULATORY_CARE_PROVIDER_SITE_OTHER): Payer: Medicare Other | Admitting: Family Medicine

## 2018-04-24 ENCOUNTER — Ambulatory Visit (INDEPENDENT_AMBULATORY_CARE_PROVIDER_SITE_OTHER): Payer: Medicare Other | Admitting: Internal Medicine

## 2018-04-24 ENCOUNTER — Encounter: Payer: Self-pay | Admitting: Internal Medicine

## 2018-04-24 ENCOUNTER — Other Ambulatory Visit: Payer: Self-pay | Admitting: *Deleted

## 2018-04-24 VITALS — BP 120/70 | HR 73 | Temp 99.6°F | Ht 62.0 in | Wt 165.0 lb

## 2018-04-24 DIAGNOSIS — I5032 Chronic diastolic (congestive) heart failure: Secondary | ICD-10-CM | POA: Diagnosis not present

## 2018-04-24 DIAGNOSIS — K582 Mixed irritable bowel syndrome: Secondary | ICD-10-CM | POA: Insufficient documentation

## 2018-04-24 DIAGNOSIS — E1169 Type 2 diabetes mellitus with other specified complication: Secondary | ICD-10-CM

## 2018-04-24 DIAGNOSIS — E739 Lactose intolerance, unspecified: Secondary | ICD-10-CM | POA: Diagnosis not present

## 2018-04-24 DIAGNOSIS — Z6830 Body mass index (BMI) 30.0-30.9, adult: Secondary | ICD-10-CM | POA: Diagnosis not present

## 2018-04-24 DIAGNOSIS — F4381 Prolonged grief disorder: Secondary | ICD-10-CM | POA: Insufficient documentation

## 2018-04-24 DIAGNOSIS — M722 Plantar fascial fibromatosis: Secondary | ICD-10-CM

## 2018-04-24 DIAGNOSIS — M8580 Other specified disorders of bone density and structure, unspecified site: Secondary | ICD-10-CM | POA: Diagnosis not present

## 2018-04-24 DIAGNOSIS — F4321 Adjustment disorder with depressed mood: Secondary | ICD-10-CM | POA: Diagnosis not present

## 2018-04-24 DIAGNOSIS — E6609 Other obesity due to excess calories: Secondary | ICD-10-CM

## 2018-04-24 DIAGNOSIS — S6991XA Unspecified injury of right wrist, hand and finger(s), initial encounter: Secondary | ICD-10-CM | POA: Diagnosis not present

## 2018-04-24 DIAGNOSIS — J111 Influenza due to unidentified influenza virus with other respiratory manifestations: Secondary | ICD-10-CM | POA: Diagnosis not present

## 2018-04-24 DIAGNOSIS — F4329 Adjustment disorder with other symptoms: Secondary | ICD-10-CM

## 2018-04-24 DIAGNOSIS — E118 Type 2 diabetes mellitus with unspecified complications: Secondary | ICD-10-CM

## 2018-04-24 MED ORDER — METFORMIN HCL 500 MG PO TABS: 500.0000 mg | ORAL_TABLET | Freq: Two times a day (BID) | ORAL | 1 refills | Status: DC

## 2018-04-24 MED ORDER — BENZONATATE 100 MG PO CAPS: 100.0000 mg | ORAL_CAPSULE | Freq: Two times a day (BID) | ORAL | 0 refills | Status: DC | PRN

## 2018-04-24 MED ORDER — METOPROLOL TARTRATE 25 MG PO TABS: 12.5000 mg | ORAL_TABLET | Freq: Two times a day (BID) | ORAL | 1 refills | Status: DC

## 2018-04-24 MED ORDER — FUROSEMIDE 20 MG PO TABS: 20.0000 mg | ORAL_TABLET | Freq: Every day | ORAL | 1 refills | Status: DC

## 2018-04-24 MED ORDER — POTASSIUM CHLORIDE CRYS ER 10 MEQ PO TBCR: 10.0000 meq | EXTENDED_RELEASE_TABLET | Freq: Every day | ORAL | 1 refills | Status: DC

## 2018-04-24 MED ORDER — ATORVASTATIN CALCIUM 80 MG PO TABS: 80.0000 mg | ORAL_TABLET | Freq: Every day | ORAL | 1 refills | Status: DC

## 2018-04-24 MED ORDER — OSELTAMIVIR PHOSPHATE 75 MG PO CAPS: 75.0000 mg | ORAL_CAPSULE | Freq: Two times a day (BID) | ORAL | 0 refills | Status: AC

## 2018-04-24 MED ORDER — GABAPENTIN 100 MG PO CAPS: 400.0000 mg | ORAL_CAPSULE | Freq: Every day | ORAL | 1 refills | Status: DC

## 2018-04-24 NOTE — Progress Notes (Signed)
Location:  [facility] Provider:  Tula Schryver L. Mariea Clonts, D.O., C.M.D.  Goals of Care:  Advanced Directives 01/09/2018  Does Patient Have a Medical Advance Directive? Yes  Type of Paramedic of [place];Living will  Does patient want to make changes to medical advance directive? No - Patient declined  Copy of [place] in Chart? Yes  Would patient like information on creating a medical advance directive? -     Chief Complaint  Patient presents with  . Medical Management of Chronic Issues    24mth follow-up    HPI: Patient is a 78 y.o. female seen today for medical management of chronic diseases.    Cough and earache since last night.  Fatigue for a couple of weeks.  Just got over her other infection and then this one started.  Daughter requests tessalon perles.  Temp 99.6.  Had chills.  People at church have been sick.  Increased joint and muscle aches.  Influenza possible.  Dry nonproductive hacky cough.  Right ear hurts.  It's the one the hearing aid sometimes gives her trouble.    Having increased wrist pain.  Wearing brace on right wrist--dropped a can of veggies on it end of October and not right since.  Brace makes it less painful.  It was not swollen, felt bruised but did not look bruised.  It's been hard to move b/c of pain. Hurts over base of thumb/radius.    Feet pain--two toes feel odd and left foot had pins and needles.  Outside of each foot hurts to where she can't put pressure on them.  Tried the plantar fasciitis exercises and it still hurt.  Depression:  She cries all the time like 4-6 times per day.  Doesn't take anything to set her off--cries about her dog, her husband, a movie, song, hymn.  It's been 4 years.  She doesn't think she really did a lot of crying at the time.   She did grieve, but she was very busy and had a lot of changes.  Her dog then was a replacement for her husband and now [place] (dog) also has passed.  She passed  last summer.  She did not get a lot of help with counseling.  She would rather not take something for depression.  Lactose intolerance?  She gets bloated and uncomfortable after milk products and it stopped happening.   Drinking lactaid.    She's been complaining of her stomach bothering her.  She is also having c/o dysphagia despite the dilation.  Has some nausea at times.  She has a prior IBS diagnosis.  That was around the same time as fibromyalgia.    Past Medical History:  Diagnosis Date  . Adjustment disorder 10/17/2005  . Atrial fibrillation ([place])   . Cataract    bilateral  . CHF (congestive heart failure) ([place])   . Contracture of knee joint 07/26/2009  . Coronary artery disease   . Degeneration of lumbar or lumbosacral intervertebral disc 07/26/2009  . Diabetes mellitus without complication ([place])   . Esophageal spasm   . Esophageal spasm   . Fibromyalgia   . Fibromyalgia   . Hearing loss 12/18/2010  . Heart murmur   . Hypercholesteremia   . Hypertension   . 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 Leaky heart valve   . Lyme disease   . Macular degeneration   . Macular degeneration of both eyes   . Migraine 10/17/2005  .  Mixed incontinence 10/17/2005  . Osteoarthritis    multiple joints   . Overweight 09/19/2010  . Pneumonia   . PONV (postoperative nausea and vomiting)   . Postartificial menopausal syndrome 10/10/2009  . Prinzmetal angina ([place]) 10/17/2005  . Psoriasis 06/12/2006  . Sciatica   . TIA (transient ischemic attack)   . Type II diabetes mellitus ([place])   . [name] 10/17/2005    Past Surgical History:  Procedure Laterality Date  . ABDOMINAL HYSTERECTOMY    . AORTIC VALVE REPLACEMENT  12/10/2013   porcine  . CHOLECYSTECTOMY  1981  . COLONOSCOPY  04/18/2006   Dr. Nelva Nay: internal hemorrhoids  . COLONOSCOPY  10/2003   Dr. Alphonsa Gin: hemorrhoids  . CORONARY ARTERY BYPASS GRAFT  2015  . ESOPHAGOGASTRODUODENOSCOPY  10/16/2013     Dr. Marla Roe: prior Nissen fundoplication intact, hypertonic LES, dilated up to 39 [place] with moderate resistance, gastritis but no H. pylori., Reactive gastritis, no celiac disease.  . ESOPHAGOGASTRODUODENOSCOPY  05/29/2012   Dr. Doy Mince: Moderately severe esophagitis, acute gastritis reactive, no H pylori, no celiac. Esophageal biopsies consistent with GERD, no Barrett  . ESOPHAGOGASTRODUODENOSCOPY  03/21/2009   Dr. Doy Mince: Reflux esophagitis, gastritis without H. pylori, esophagus stretched 57 savory  . ESOPHAGOGASTRODUODENOSCOPY  02/20/2008   Dr. Doy Mince: Esophagus dilated to 60 French, reactive gastropathy with no H pylori. No Barrett's on esophageal biopsy  . ESOPHAGOGASTRODUODENOSCOPY  09/24/2006   Dr. Doy Mince: Tight wrap noted, reactive gastropathy, no Barrett's  . ESOPHAGOGASTRODUODENOSCOPY (EGD) WITH PROPOFOL N/A 01/09/2017   Procedure: ESOPHAGOGASTRODUODENOSCOPY (EGD) WITH PROPOFOL;  Surgeon: Daneil Dolin, MD;  [facility];  Service: Endoscopy;  Laterality: N/A;  2:15PM  . FRACTURE SURGERY Left    wrist  . JOINT REPLACEMENT    . MALONEY DILATION N/A 01/09/2017   Procedure: Venia Minks DILATION;  Surgeon: Daneil Dolin, MD;  [facility];  Service: Endoscopy;  Laterality: N/A;  . MITRAL VALVE REPAIR  11/2013  . NISSEN FUNDOPLICATION  7371  . REPLACEMENT TOTAL KNEE BILATERAL  2010/2013    Allergies  Allergen Reactions  . Penicillins Anaphylaxis and Other (See Comments)    Has patient had a PCN reaction causing immediate rash, facial/tongue/throat swelling, SOB or lightheadedness with hypotension: Yes Has patient had a PCN reaction causing severe rash involving mucus membranes or skin necrosis: Yes Has patient had a PCN reaction that required hospitalization Yes Has patient had a PCN reaction occurring within the last 10 years: No If all of the above answers are "NO", then may proceed with Cephalosporin use.   . Latex Other (See Comments)     Redness and rash  . Morphine And Related Nausea Only and Other (See Comments)    Dizziness  . Tape Other (See Comments)    Redness and rash  . Iodine Other (See Comments)    Redness and rash    Outpatient Encounter Medications as of 04/24/2018  Medication Sig  . acetaminophen (TYLENOL) 650 MG CR tablet Take 650-1,300 mg by mouth every 8 (eight) hours as needed for pain.   Marland Kitchen aspirin EC 81 MG tablet Take 81 mg by mouth every morning.   Marland Kitchen atorvastatin (LIPITOR) 80 MG tablet Take 1 tablet (80 mg total) by mouth daily.  . fluticasone (FLONASE) 50 MCG/ACT nasal spray USE 2 SPRAYS IN EACH NOSTRIL EVERY DAY  . furosemide (LASIX) 20 MG tablet Take 1 tablet (20 mg total) by mouth daily.  Marland Kitchen gabapentin (NEURONTIN) 100 MG capsule Take 4 capsules (400 mg total)  by mouth daily.  Marland Kitchen loperamide (IMODIUM A-D) 2 MG tablet Take 2 mg by mouth as needed for diarrhea or loose stools.  . metFORMIN (GLUCOPHAGE) 500 MG tablet Take 1 tablet (500 mg total) by mouth 2 (two) times daily with a meal.  . metoprolol tartrate (LOPRESSOR) 25 MG tablet Take 0.5 tablets (12.5 mg total) by mouth 2 (two) times daily.  . potassium chloride (K-DUR,KLOR-CON) 10 MEQ tablet Take 1 tablet (10 mEq total) by mouth daily.  . Vitamin D, Ergocalciferol, (DRISDOL) 50000 units CAPS capsule Take 1 capsule (50,000 Units total) by mouth every 7 (seven) days.  Marland Kitchen warfarin (COUMADIN) 6 MG tablet Take 1 tablet (6 mg total) by mouth daily.  . [DISCONTINUED] atorvastatin (LIPITOR) 80 MG tablet TAKE 1 TABLET EVERY DAY  . [DISCONTINUED] furosemide (LASIX) 20 MG tablet TAKE 1 TABLET (20 MG TOTAL) BY MOUTH EVERY MORNING AT 8 AM  . [DISCONTINUED] gabapentin (NEURONTIN) 100 MG capsule TAKE 1 CAPSULE  EVERY MORNING AND TAKE 2 TO 3 CAPSULES  AT BEDTIME  . [DISCONTINUED] metFORMIN (GLUCOPHAGE) 500 MG tablet TAKE 1 TABLET TWICE DAILY WITH A MEAL AT 8AM AND 7PM  . [DISCONTINUED] metoprolol tartrate (LOPRESSOR) 25 MG tablet TAKE 1/2 TABLETS TWO TIMES DAILY AT  8 AM & 7 PM  . [DISCONTINUED] potassium chloride (K-DUR,KLOR-CON) 10 MEQ tablet TAKE 1 TABLET (10 MEQ TOTAL) BY MOUTH EVERY MORNING AT  8 AM  . lisinopril (PRINIVIL,ZESTRIL) 10 MG tablet Take 1 tablet (10 mg total) by mouth daily.  . [DISCONTINUED] cetirizine (ZYRTEC) 10 MG tablet Take 10 mg by mouth daily as needed for allergies.   . [DISCONTINUED] empagliflozin (JARDIANCE) 10 MG TABS tablet Take 10 mg by mouth daily.  . [DISCONTINUED] pantoprazole (PROTONIX) 40 MG tablet TAKE 1 TABLET DAILY BEFORE BREAKFAST.   No facility-administered encounter medications on file as of 04/24/2018.     Review of Systems:  Review of Systems  Constitutional: Positive for chills, fever and malaise/fatigue. Negative for weight loss.  HENT: Positive for congestion, hearing loss and sore throat.   Eyes: Positive for blurred vision.  Respiratory: Positive for cough and shortness of breath. Negative for sputum production and wheezing.   Cardiovascular: Negative for chest pain, palpitations and leg swelling.  Gastrointestinal: Positive for nausea. Negative for abdominal pain, blood in stool, constipation, diarrhea and melena.  Genitourinary: Negative for dysuria.  Musculoskeletal: Positive for myalgias. Negative for falls.       Foot arch pain laterally  Skin: Negative for itching and rash.  Neurological: Positive for sensory change. Negative for dizziness and loss of consciousness.  Endo/Heme/Allergies: Does not bruise/bleed easily.  Psychiatric/Behavioral: Positive for depression and memory loss. The patient is not nervous/anxious and does not have insomnia.     Health Maintenance  Topic Date Due  . URINE MICROALBUMIN  06/19/2016  . OPHTHALMOLOGY EXAM  07/03/2018  . HEMOGLOBIN A1C  07/08/2018  . FOOT EXAM  09/06/2018  . COLONOSCOPY  03/26/2022  . TETANUS/TDAP  10/13/2026  . INFLUENZA VACCINE  Completed  . DEXA SCAN  Completed  . PNA vac Low Risk Adult  Completed    Physical Exam: Vitals:    04/24/18 1430  BP: 120/70  Pulse: 73  Temp: 99.6 F (37.6 C)  TempSrc: Oral  SpO2: 96%  Weight: 165 lb (74.8 kg)  Height: 5\' 2"  (1.575 m)   Body mass index is 30.18 kg/m. Physical Exam Constitutional:      Appearance: She is obese. She is ill-appearing. She is not toxic-appearing or  diaphoretic.  HENT:     Head: Normocephalic and atraumatic.     Right Ear: Tympanic membrane, ear canal and external ear normal.     Left Ear: Tympanic membrane, ear canal and external ear normal.     Nose: Congestion present.  Eyes:     Conjunctiva/sclera: Conjunctivae normal.  Cardiovascular:     Rate and Rhythm: Normal rate and regular rhythm.     Pulses: Normal pulses.     Heart sounds: Murmur present.  Pulmonary:     Effort: Pulmonary effort is normal.     Breath sounds: Normal breath sounds.  Abdominal:     General: Bowel sounds are normal.  Musculoskeletal:        General: No deformity.     Comments: Right wrist and base of thumb tender (radial aspect, snuff box region), but no swelling, no visible loose bone, normal ROM, but not improving since it happened in October (just now told me)  Skin:    General: Skin is warm and dry.     Capillary Refill: Capillary refill takes less than 2 seconds.  Neurological:     General: No focal deficit present.     Mental Status: She is alert and oriented to person, place, and time.     Sensory: Sensory deficit present.     Comments: Some short term memory loss  Psychiatric:     Comments: Tearful talking about passing of her husband a few years ago and dog over the summer     Labs reviewed: Basic Metabolic Panel: Recent Labs    09/03/17 0819 10/08/17 1206 01/06/18 0820  NA 140 141 140  K 4.4 4.1 4.4  CL 104 104 106  CO2 30 28 28   GLUCOSE 131* 156* 124*  BUN 24 23 19   CREATININE 0.77 0.97 0.79  CALCIUM 9.3 9.4 9.5   Liver Function Tests: Recent Labs    01/06/18 0820  AST 21  ALT 17  ALKPHOS 79  BILITOT 0.7  PROT 7.5  ALBUMIN  3.9   No results for input(s): LIPASE, AMYLASE in the last 8760 hours. No results for input(s): AMMONIA in the last 8760 hours. CBC: Recent Labs    09/03/17 0819 01/06/18 0820  WBC 7.7 9.9  NEUTROABS 4,766 6.5  HGB 12.4 12.3  HCT 36.3 39.2  MCV 85.8 88.7  PLT 351 395   Lipid Panel: Recent Labs    09/03/17 0819 01/06/18 0820  CHOL 148 131  HDL 43* 35*  LDLCALC 75 61  TRIG 199* 173*  CHOLHDL 3.4 3.7   Lab Results  Component Value Date   HGBA1C 7.2 (H) 01/06/2018    Assessment/Plan 1. Influenza -suspect--not confirmed with swab, but classic s/s - recommended her daughter also get tamiflu from her doctor to prevent her from getting so sick as well - benzonatate (TESSALON) 100 MG capsule; Take 1 capsule (100 mg total) by mouth 2 (two) times daily as needed for cough.  Dispense: 20 capsule; Refill: 0 - oseltamivir (TAMIFLU) 75 MG capsule; Take 1 capsule (75 mg total) by mouth 2 (two) times daily for 7 days.  Dispense: 14 capsule; Refill: 0  2. Type 2 diabetes mellitus with complication, without long-term current use of insulin (HCC) -cont same regimen, f/u labs today - metFORMIN (GLUCOPHAGE) 500 MG tablet; Take 1 tablet (500 mg total) by mouth 2 (two) times daily with a meal.  Dispense: 180 tablet; Refill: 1 - Ambulatory referral to Podiatry due to progressing neuropathy and lack of improvement  in what I had diagnosed as plantar fasciitis despite doing the exercises I recommended (not clear how much she really did these)  3. Lactose intolerance -seems to be a new thing for her--she has eliminated these products and some of her bloating and pain has improved  4. Irritable bowel syndrome with both constipation and diarrhea -still has other difficulty with her bowels outside of milk products and came to me with this diagnosis from her previous PCP  5. Plantar fasciitis, bilateral - not responding to classic exercises  - Ambulatory referral to Podiatry  6. Injury of right  wrist, initial encounter - with thinning bones and having dropped can on it, I'm concerned for small/hairline fx, tender in snuff box - DG Wrist Complete Right; Future  7. Senile osteopenia -encouraged her to exercise more (seems she's fallen off with this) and continue her vitamin D supplementation  8. Grief reaction with prolonged bereavement -advised to reach out to [facility] for grief counseling--reports her initial counselor when her husband died was not helpful (pt herself was a Social worker)  Labs/tests ordered:   Orders Placed This Encounter  Procedures  . DG Wrist Complete Right    Standing Status:   Future    Standing Expiration Date:   06/23/2019    Order Specific Question:   Reason for Exam (SYMPTOM  OR DIAGNOSIS REQUIRED)    Answer:   right wrist and base of thumb pain since can fell on her wrist    Order Specific Question:   Preferred imaging location?    Answer:   [id number]    Order Specific Question:   Radiology Contrast Protocol - do NOT remove file path    Answer:   [email or web address]  . Ambulatory referral to Podiatry    Referral Priority:   Routine    Referral Type:   Consultation    Referral Reason:   Specialty Services Required    Requested Specialty:   Podiatry    Number of Visits Requested:   1   Next appt:  09/04/2018  Keona Bilyeu L. Jaemarie Hochberg, D.O. [facility] [address], [place] [address] Cell Phone (Mon-Fri 8am-5pm):  [phone] On Call:  [phone] & follow prompts after 5pm & weekends Office Phone:  [phone] Office Fax:  [phone]

## 2018-04-24 NOTE — Patient Instructions (Addendum)
Use mucinex DM for the cough bid for a week. Tessalon perles for bedtime  Reach out to [facility] for grief counseling.

## 2018-04-25 LAB — HEMOGLOBIN A1C
Hgb A1c MFr Bld: 7.9 % of total Hgb — ABNORMAL HIGH (ref <5.7)
Mean Plasma Glucose: 180 (calc)
eAG (mmol/L): 10 (calc)

## 2018-04-25 LAB — COMPLETE METABOLIC PANEL WITH GFR
AG Ratio: 1.4 (calc) (ref 1.0–2.5)
ALT: 17 U/L (ref 6–29)
AST: 21 U/L (ref 10–35)
Albumin: 4.3 g/dL (ref 3.6–5.1)
Alkaline phosphatase (APISO): 95 U/L (ref 33–130)
BUN/Creatinine Ratio: 26 (calc) — ABNORMAL HIGH (ref 6–22)
BUN: 25 mg/dL (ref 7–25)
CO2: 28 mmol/L (ref 20–32)
Calcium: 9.9 mg/dL (ref 8.6–10.4)
Chloride: 101 mmol/L (ref 98–110)
Creat: 0.96 mg/dL — ABNORMAL HIGH (ref 0.60–0.93)
GFR, Est African American: 67 mL/min/{1.73_m2} (ref >=60)
GFR, Est Non African American: 58 mL/min/{1.73_m2} — ABNORMAL LOW (ref >=60)
Globulin: 3 g/dL (calc) (ref 1.9–3.7)
Glucose, Bld: 130 mg/dL (ref 65–139)
Potassium: 4.5 mmol/L (ref 3.5–5.3)
Sodium: 139 mmol/L (ref 135–146)
Total Bilirubin: 0.4 mg/dL (ref 0.2–1.2)
Total Protein: 7.3 g/dL (ref 6.1–8.1)

## 2018-04-25 LAB — CBC WITH DIFFERENTIAL/PLATELET
Absolute Monocytes: 818 cells/uL (ref 200–950)
Basophils Absolute: 67 cells/uL (ref 0–200)
Basophils Relative: 0.6 %
Eosinophils Absolute: 258 cells/uL (ref 15–500)
Eosinophils Relative: 2.3 %
HCT: 40.2 % (ref 35.0–45.0)
Hemoglobin: 13.4 g/dL (ref 11.7–15.5)
Lymphs Abs: 582 cells/uL — ABNORMAL LOW (ref 850–3900)
MCH: 28.2 pg (ref 27.0–33.0)
MCHC: 33.3 g/dL (ref 32.0–36.0)
MCV: 84.6 fL (ref 80.0–100.0)
MPV: 10.5 fL (ref 7.5–12.5)
Monocytes Relative: 7.3 %
Neutro Abs: 9475 cells/uL — ABNORMAL HIGH (ref 1500–7800)
Neutrophils Relative %: 84.6 %
Platelets: 393 10*3/uL (ref 140–400)
RBC: 4.75 10*6/uL (ref 3.80–5.10)
RDW: 13 % (ref 11.0–15.0)
Total Lymphocyte: 5.2 %
WBC: 11.2 10*3/uL — ABNORMAL HIGH (ref 3.8–10.8)

## 2018-04-29 ENCOUNTER — Ambulatory Visit (INDEPENDENT_AMBULATORY_CARE_PROVIDER_SITE_OTHER): Payer: Medicare Other | Admitting: Pharmacist

## 2018-04-29 DIAGNOSIS — Z8673 Personal history of transient ischemic attack (TIA), and cerebral infarction without residual deficits: Secondary | ICD-10-CM | POA: Diagnosis not present

## 2018-04-29 DIAGNOSIS — I4891 Unspecified atrial fibrillation: Secondary | ICD-10-CM

## 2018-04-29 DIAGNOSIS — Z5181 Encounter for therapeutic drug level monitoring: Secondary | ICD-10-CM | POA: Diagnosis not present

## 2018-04-29 DIAGNOSIS — I480 Paroxysmal atrial fibrillation: Secondary | ICD-10-CM | POA: Diagnosis not present

## 2018-04-29 LAB — POCT INR: INR: 3.4 — AB (ref 2.0–3.0)

## 2018-04-29 NOTE — Patient Instructions (Signed)
Description   No Coumadin tomorrow (since already took today) then resume 1 tablet daily Recheck in 4 weeks

## 2018-05-05 ENCOUNTER — Ambulatory Visit (HOSPITAL_COMMUNITY)
Admission: RE | Admit: 2018-05-05 | Discharge: 2018-05-05 | Disposition: A | Discharge status: Home / Self Care | Payer: Medicare Other | Source: Ambulatory Visit | Attending: Internal Medicine | Admitting: Internal Medicine

## 2018-05-05 DIAGNOSIS — S6991XA Unspecified injury of right wrist, hand and finger(s), initial encounter: Secondary | ICD-10-CM

## 2018-05-05 DIAGNOSIS — M19031 Primary osteoarthritis, right wrist: Secondary | ICD-10-CM | POA: Diagnosis not present

## 2018-05-13 DIAGNOSIS — E119 Type 2 diabetes mellitus without complications: Secondary | ICD-10-CM | POA: Diagnosis not present

## 2018-05-13 DIAGNOSIS — H353212 Exudative age-related macular degeneration, right eye, with inactive choroidal neovascularization: Secondary | ICD-10-CM | POA: Diagnosis not present

## 2018-05-13 DIAGNOSIS — H43813 Vitreous degeneration, bilateral: Secondary | ICD-10-CM | POA: Diagnosis not present

## 2018-05-13 DIAGNOSIS — H353221 Exudative age-related macular degeneration, left eye, with active choroidal neovascularization: Secondary | ICD-10-CM | POA: Diagnosis not present

## 2018-05-13 LAB — HM DIABETES EYE EXAM

## 2018-05-15 ENCOUNTER — Ambulatory Visit: Payer: Medicare Other | Admitting: Internal Medicine

## 2018-05-27 ENCOUNTER — Ambulatory Visit (INDEPENDENT_AMBULATORY_CARE_PROVIDER_SITE_OTHER): Payer: Medicare Other | Admitting: *Deleted

## 2018-05-27 DIAGNOSIS — Z5181 Encounter for therapeutic drug level monitoring: Secondary | ICD-10-CM

## 2018-05-27 DIAGNOSIS — Z8673 Personal history of transient ischemic attack (TIA), and cerebral infarction without residual deficits: Secondary | ICD-10-CM | POA: Diagnosis not present

## 2018-05-27 DIAGNOSIS — I4891 Unspecified atrial fibrillation: Secondary | ICD-10-CM

## 2018-05-27 LAB — POCT INR: INR: 1.9 — AB (ref 2.0–3.0)

## 2018-05-27 NOTE — Patient Instructions (Signed)
Take extra 1/2 tablet of coumadin today then resume 1 tablet daily Recheck in 4 weeks

## 2018-06-02 ENCOUNTER — Encounter: Payer: Self-pay | Admitting: Internal Medicine

## 2018-06-02 DIAGNOSIS — E1140 Type 2 diabetes mellitus with diabetic neuropathy, unspecified: Secondary | ICD-10-CM | POA: Diagnosis not present

## 2018-06-02 DIAGNOSIS — S6991XA Unspecified injury of right wrist, hand and finger(s), initial encounter: Secondary | ICD-10-CM

## 2018-06-04 ENCOUNTER — Telehealth: Payer: Self-pay | Admitting: *Deleted

## 2018-06-04 ENCOUNTER — Other Ambulatory Visit: Payer: Self-pay | Admitting: Internal Medicine

## 2018-06-04 NOTE — Telephone Encounter (Signed)
Received fax from [facility], Dr. Tyson Babinski,  requesting Therapeutic Shoes due to Poor Circulation. Form placed in Dr. Cyndi Lennert folder to review and sign along with last OV notes.  To be faxed back to [phone]

## 2018-06-13 ENCOUNTER — Encounter (INDEPENDENT_AMBULATORY_CARE_PROVIDER_SITE_OTHER): Payer: Self-pay | Admitting: Orthopaedic Surgery

## 2018-06-13 ENCOUNTER — Other Ambulatory Visit: Payer: Self-pay

## 2018-06-13 ENCOUNTER — Ambulatory Visit (INDEPENDENT_AMBULATORY_CARE_PROVIDER_SITE_OTHER): Payer: Medicare Other | Admitting: Orthopaedic Surgery

## 2018-06-13 VITALS — BP 137/77 | HR 58 | Ht 62.0 in | Wt 160.0 lb

## 2018-06-13 DIAGNOSIS — E119 Type 2 diabetes mellitus without complications: Secondary | ICD-10-CM | POA: Diagnosis not present

## 2018-06-13 DIAGNOSIS — I110 Hypertensive heart disease with heart failure: Secondary | ICD-10-CM

## 2018-06-13 DIAGNOSIS — I509 Heart failure, unspecified: Secondary | ICD-10-CM

## 2018-06-13 DIAGNOSIS — M25531 Pain in right wrist: Secondary | ICD-10-CM | POA: Diagnosis not present

## 2018-06-13 DIAGNOSIS — M79641 Pain in right hand: Secondary | ICD-10-CM

## 2018-06-13 DIAGNOSIS — M654 Radial styloid tenosynovitis [de Quervain]: Secondary | ICD-10-CM | POA: Diagnosis not present

## 2018-06-13 DIAGNOSIS — Z794 Long term (current) use of insulin: Secondary | ICD-10-CM | POA: Diagnosis not present

## 2018-06-13 MED ORDER — LIDOCAINE HCL 1 % IJ SOLN
1.0000 mL | INTRAMUSCULAR | Status: AC | PRN
  Administered 2018-06-13: 1 mL

## 2018-06-13 MED ORDER — METHYLPREDNISOLONE ACETATE 40 MG/ML IJ SUSP
20.0000 mg | INTRAMUSCULAR | Status: AC | PRN
  Administered 2018-06-13: 20 mg

## 2018-06-13 MED ORDER — BUPIVACAINE HCL 0.25 % IJ SOLN
0.3300 mL | INTRAMUSCULAR | Status: AC | PRN
  Administered 2018-06-13: .33 mL

## 2018-06-13 NOTE — Progress Notes (Signed)
Office Visit Note   Patient: Jeanette Yates           Date of Birth: 1941-01-11           MRN: [id number] Visit Date: 06/13/2018              Requested by: Gayland Curry, DO [place], [place] [address] PCP: Gayland Curry, DO   Assessment & Plan: Visit Diagnoses:  1. Pain of right hand     Plan: Symptoms of right hand and wrist are consistent with de Quervain's tenosynovitis.  Long discussion with patient and daughter regarding the diagnosis.  Will inject the first dorsal extensor compartment with Xylocaine Marcaine and Depo-Medrol and applied de Quervain's splint.  Office 2 to 3 weeks.  Consider MRI scan if no improvement  Follow-Up Instructions: Return if symptoms worsen or fail to improve.   Orders:  Orders Placed This Encounter  Procedures  . Hand/UE Inj   No orders of the defined types were placed in this encounter.     Procedures: Hand/UE Inj for de Quervain's tenosynovitis on 06/13/2018 10:15 AM Details: 27 G needle, dorsal approach Medications: 1 mL lidocaine 1 %; 0.33 mL bupivacaine 0.25 %; 20 mg methylPREDNISolone acetate 40 MG/ML      Clinical Data: No additional findings.   Subjective: Chief Complaint  Patient presents with  . Right Wrist - Pain  Patient presents today for right wrist pain X 5 months. She remembers dropping a can of vegetables on her wrist, but does not feel like that has caused this. She said that she hurts on the lateral side and radiates into her thumb. She is taking tylenol as needed. She has not noticed any swelling, numbnes, or tingling. She is right hand dominant. She had x-rays 05/05/18 with her PCP. They are in the PACS system.  I reviewed the films on the PACS system and did not see any evidence of any acute injury at the time of the films were performed in February.  Mild degenerative changes in the carpus but the joint spaces are still well-maintained.  No history of problems with her hand prior to the injury.  Denies  any numbness or tingling  HPI  Review of Systems   Objective: Vital Signs: BP 137/77   Pulse (!) 58   Ht 5\' 2"  (1.575 m)   Wt 160 lb (72.6 kg)   BMI 29.26 kg/m   Physical Exam Constitutional:      Appearance: She is well-developed.  Eyes:     Pupils: Pupils are equal, round, and reactive to light.  Pulmonary:     Effort: Pulmonary effort is normal.  Skin:    General: Skin is warm and dry.  Neurological:     Mental Status: She is alert and oriented to person, place, and time.  Psychiatric:        Behavior: Behavior normal.     Ortho Exam awake alert and oriented x3.  Comfortable sitting.  Examination of the right hand reveals no swelling.  Good sensibility.  Pain specifically over the first dorsal extensor compartment with a positive Finkelstein's test.  No pain at the base of the thumb.  No pain at the radiocarpal joint dorsally  Specialty Comments:  No specialty comments available.  Imaging: No results found.   PMFS History: Patient Active Problem List   Diagnosis Date Noted  . Grief reaction with prolonged bereavement 04/24/2018  . Senile osteopenia 04/24/2018  . Injury of right  wrist 04/24/2018  . Plantar fasciitis, bilateral 04/24/2018  . Irritable bowel syndrome with both constipation and diarrhea 04/24/2018  . Lactose intolerance 04/24/2018  . Influenza 04/24/2018  . Body mass index (BMI) of 30.0-30.9 in adult 09/05/2017  . Class 1 obesity due to excess calories with serious comorbidity and body mass index (BMI) of 30.0 to 30.9 in adult 09/05/2017  . Seasonal allergies 09/05/2017  . Hoarseness 09/05/2017  . Hyperlipidemia associated with type 2 diabetes mellitus ([place]) 09/05/2017  . Balance problem 02/11/2017  . Dysphagia 11/15/2016  . S/P AVR (aortic valve replacement) 04/13/2016  . Encounter for therapeutic drug monitoring 01/02/2016  . Hx of TIA (transient ischemic attack) and stroke 12/06/2015  . Type 2 diabetes mellitus with complication, without  long-term current use of insulin ([place])   . Osteoarthritis   . Fibromyalgia   . Atrial fibrillation ([place])   . CHF (congestive heart failure) ([place])   . Esophageal spasm   . Numbness on right side 05/03/2015  . TIA (transient ischemic attack) 05/02/2015  . Essential hypertension 05/02/2015   Past Medical History:  Diagnosis Date  . Adjustment disorder 10/17/2005  . Atrial fibrillation ([place])   . Cataract    bilateral  . CHF (congestive heart failure) ([place])   . Contracture of knee joint 07/26/2009  . Coronary artery disease   . Degeneration of lumbar or lumbosacral intervertebral disc 07/26/2009  . Diabetes mellitus without complication ([place])   . Esophageal spasm   . Esophageal spasm   . Fibromyalgia   . Fibromyalgia   . Hearing loss 12/18/2010  . Heart murmur   . Hypercholesteremia   . Hypertension   . 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 Leaky heart valve   . Lyme disease   . Macular degeneration   . Macular degeneration of both eyes   . Migraine 10/17/2005  . Mixed incontinence 10/17/2005  . Osteoarthritis    multiple joints   . Overweight 09/19/2010  . Pneumonia   . PONV (postoperative nausea and vomiting)   . Postartificial menopausal syndrome 10/10/2009  . Prinzmetal angina ([place]) 10/17/2005  . Psoriasis 06/12/2006  . Sciatica   . TIA (transient ischemic attack)   . Type II diabetes mellitus ([place])   . Urolith 10/17/2005    Family History  Problem Relation Age of Onset  . Stroke Mother   . Heart failure Mother   . Heart disease Mother   . Mitral valve prolapse Mother   . Diabetes Father   . Heart attack Father   . Stroke Father   . Heart disease Father   . Hypertension Father   . Alzheimer's disease Father   . Stroke Maternal Grandmother   . Arthritis Maternal Grandfather        hands  . Breast cancer Paternal Grandmother   . Osteoporosis Paternal Grandmother   . Breast cancer Cousin   . Colon cancer Neg Hx     Past  Surgical History:  Procedure Laterality Date  . ABDOMINAL HYSTERECTOMY    . AORTIC VALVE REPLACEMENT  12/10/2013   porcine  . CHOLECYSTECTOMY  1981  . COLONOSCOPY  04/18/2006   Dr. Nelva Nay: internal hemorrhoids  . COLONOSCOPY  10/2003   Dr. Alphonsa Gin: hemorrhoids  . CORONARY ARTERY BYPASS GRAFT  2015  . ESOPHAGOGASTRODUODENOSCOPY  10/16/2013   Dr. Marla Roe: prior Nissen fundoplication intact, hypertonic LES, dilated up to 71 [place] with moderate resistance, gastritis but no H. pylori., Reactive gastritis, no celiac disease.  Marland Kitchen  ESOPHAGOGASTRODUODENOSCOPY  05/29/2012   Dr. Doy Mince: Moderately severe esophagitis, acute gastritis reactive, no H pylori, no celiac. Esophageal biopsies consistent with GERD, no Barrett  . ESOPHAGOGASTRODUODENOSCOPY  03/21/2009   Dr. Doy Mince: Reflux esophagitis, gastritis without H. pylori, esophagus stretched 57 savory  . ESOPHAGOGASTRODUODENOSCOPY  02/20/2008   Dr. Doy Mince: Esophagus dilated to 64 French, reactive gastropathy with no H pylori. No Barrett's on esophageal biopsy  . ESOPHAGOGASTRODUODENOSCOPY  09/24/2006   Dr. Doy Mince: Tight wrap noted, reactive gastropathy, no Barrett's  . ESOPHAGOGASTRODUODENOSCOPY (EGD) WITH PROPOFOL N/A 01/09/2017   Procedure: ESOPHAGOGASTRODUODENOSCOPY (EGD) WITH PROPOFOL;  Surgeon: Daneil Dolin, MD;  [facility];  Service: Endoscopy;  Laterality: N/A;  2:15PM  . FRACTURE SURGERY Left    wrist  . JOINT REPLACEMENT    . MALONEY DILATION N/A 01/09/2017   Procedure: Venia Minks DILATION;  Surgeon: Daneil Dolin, MD;  [facility];  Service: Endoscopy;  Laterality: N/A;  . MITRAL VALVE REPAIR  11/2013  . NISSEN FUNDOPLICATION  9244  . REPLACEMENT TOTAL KNEE BILATERAL  2010/2013   Social History   Occupational History  . Not on file  Tobacco Use  . Smoking status: Former Smoker    Packs/day: 1.00    Years: 20.00    Pack years: 20.00    Types: Cigarettes    Last attempt to quit:  09/12/1966    Years since quitting: 51.7  . Smokeless tobacco: Never Used  Substance and Sexual Activity  . Alcohol use: No  . Drug use: No  . Sexual activity: Never

## 2018-06-23 ENCOUNTER — Telehealth: Payer: Self-pay | Admitting: *Deleted

## 2018-06-23 ENCOUNTER — Other Ambulatory Visit: Payer: Self-pay

## 2018-06-23 NOTE — Telephone Encounter (Signed)

## 2018-06-24 ENCOUNTER — Other Ambulatory Visit: Payer: Self-pay

## 2018-06-24 ENCOUNTER — Ambulatory Visit (INDEPENDENT_AMBULATORY_CARE_PROVIDER_SITE_OTHER): Payer: Medicare Other | Admitting: *Deleted

## 2018-06-24 DIAGNOSIS — I4891 Unspecified atrial fibrillation: Secondary | ICD-10-CM | POA: Diagnosis not present

## 2018-06-24 DIAGNOSIS — Z5181 Encounter for therapeutic drug level monitoring: Secondary | ICD-10-CM

## 2018-06-24 DIAGNOSIS — Z8673 Personal history of transient ischemic attack (TIA), and cerebral infarction without residual deficits: Secondary | ICD-10-CM

## 2018-06-24 LAB — POCT INR: INR: 1.2 — AB (ref 2.0–3.0)

## 2018-06-24 NOTE — Patient Instructions (Signed)
Take 1 1/2 tablets x 3 days then resume 1 tablet daily Recheck in 2 weeks

## 2018-07-07 ENCOUNTER — Telehealth: Payer: Self-pay | Admitting: *Deleted

## 2018-07-07 NOTE — Telephone Encounter (Signed)
° °  COVID-19 Pre-Screening Questions: ° °• Do you currently have a fever?NO ° ° °• Have you recently travelled on a cruise, internationally, or to [place], [place], [place], [place], [place], or [place], [place] ([employer]) ? NO °•  °• Have you been in contact with someone that is currently pending confirmation of Covid19 testing or has been confirmed to have the Covid19 virus?  NO °•  °Are you currently experiencing fatigue or cough? NO ° ° °   ° ° ° ° °

## 2018-07-08 ENCOUNTER — Other Ambulatory Visit: Payer: Self-pay

## 2018-07-08 ENCOUNTER — Other Ambulatory Visit: Payer: Self-pay | Admitting: Cardiology

## 2018-07-08 ENCOUNTER — Ambulatory Visit (INDEPENDENT_AMBULATORY_CARE_PROVIDER_SITE_OTHER): Payer: Medicare Other | Admitting: *Deleted

## 2018-07-08 DIAGNOSIS — Z5181 Encounter for therapeutic drug level monitoring: Secondary | ICD-10-CM | POA: Diagnosis not present

## 2018-07-08 DIAGNOSIS — Z8673 Personal history of transient ischemic attack (TIA), and cerebral infarction without residual deficits: Secondary | ICD-10-CM

## 2018-07-08 DIAGNOSIS — I4891 Unspecified atrial fibrillation: Secondary | ICD-10-CM | POA: Diagnosis not present

## 2018-07-08 LAB — POCT INR: INR: 3.8 — AB (ref 2.0–3.0)

## 2018-07-08 NOTE — Patient Instructions (Signed)
Took coumadin this morning. Hold coumadin tomorrow (Wed) then resume 1 tablet daily Recheck in 3 weeks

## 2018-07-26 ENCOUNTER — Encounter: Payer: Self-pay | Admitting: Internal Medicine

## 2018-07-28 ENCOUNTER — Other Ambulatory Visit: Payer: Self-pay

## 2018-07-28 ENCOUNTER — Ambulatory Visit (INDEPENDENT_AMBULATORY_CARE_PROVIDER_SITE_OTHER): Payer: Medicare Other | Admitting: Internal Medicine

## 2018-07-28 ENCOUNTER — Encounter: Payer: Self-pay | Admitting: Internal Medicine

## 2018-07-28 VITALS — Ht 62.0 in | Wt 165.0 lb

## 2018-07-28 DIAGNOSIS — Z6830 Body mass index (BMI) 30.0-30.9, adult: Secondary | ICD-10-CM | POA: Diagnosis not present

## 2018-07-28 DIAGNOSIS — M79672 Pain in left foot: Secondary | ICD-10-CM | POA: Diagnosis not present

## 2018-07-28 DIAGNOSIS — E1169 Type 2 diabetes mellitus with other specified complication: Secondary | ICD-10-CM | POA: Diagnosis not present

## 2018-07-28 NOTE — Patient Instructions (Signed)
Keep your foot clean and dry. Apply antibiotic ointment over the abrasion on your left great toe daily after bathing. Avoid wearing sandals or other shoes that may rub that area. Use socks and sneakers right now. Elevate feet at rest. Please go get the xray done asap to ensure there is no broken bone. You may use ice and take tylenol for the discomfort. Try to do other chair exercise to stay active since your walking is limited at present.

## 2018-07-28 NOTE — Progress Notes (Signed)
This service is provided via telemedicine  No vital signs collected/recorded due to the encounter was a telemedicine visit.   Location of patient (ex: home, work): Home   Patient consents to a telephone visit: Yes   Location of the provider (ex: office, home):  Office  Names of all persons participating in the telemedicine service and their role in the encounter:  Ruthell Rummage CMA, Dr. Hollace Kinnier, Duanne Guess and daughter [name]   Time spent on call:  Ruthell Rummage CMA spent 10 Minutes on phone with patient   Virtual Visit via Video Note  I connected with Tiburcio Bash on 74/08/14 at  1:00 PM EDT by a video enabled telemedicine application and verified that I am speaking with the correct person using two identifiers.  Location: Patient: Home Provider: Office   I discussed the limitations of evaluation and management by telemedicine and the availability of in person appointments. The patient expressed understanding and agreed to proceed.  History of Present Illness: 78 yo female with diabetes, afib on coumadin, chf, prior AVR, prior tia, some cognitive impairment from prior vascular injury, IBS, osteopenia, osteoarthritis and more.  She suddenly developed redness on top of her left foot after wearing a new pair of sandals just once w/o socks.  This happened a month ago and she just reported it to [place], her daughter this week.  [name] looked at it and thought it looked more bruised though there is a small open area on the proximal great toe.  She struggles to bear weight on the foot due to pain over the front part of her foot and great toe area.  Of note, she's on coumadin for her afib and the INR had been a bit erratic per her daughter.  Pt denies stubbing the toe, bending it back, dropping anything on it or injuring her foot in any way outside of the sandal injury 4 wks ago.  Also, she has gained weight due to inactivity and food intake.  She is up to 165 lbs now which puts here  BMI over 30.  She would qualify for the weight loss clinic now, but [name] thinks she's too close and may lose the weight by the time she gets an appt which would result in her not qualifying again.   Observations/Objective: BMI is up to 30.18, wt 165lb and ht 35ft 2 in Left foot with ecchymoses along entire medial aspect and into arch from interphalangeal joint; small abrasion over the MTP area  Assessment and Plan: 1. Left foot pain -r/o fx - DG Foot Complete Left; Future -tx as below in instructions  2. Type 2 diabetes mellitus with other specified complication, without long-term current use of insulin (HCC) -control likely worse as pt inactive and gaining weight  3. Body mass index (BMI) of 30.0-30.9 in adult -weight up 5 lbs from last epic visit -encouraged exercise as able with chair exercises since walking limited at present due to pain [facility]  Follow Up Instructions: Keep your foot clean and dry. Apply antibiotic ointment over the abrasion on your left great toe daily after bathing. Avoid wearing sandals or other shoes that may rub that area. Use socks and sneakers right now. Elevate feet at rest. Please go get the xray done asap to ensure there is no broken bone. You may use ice and take tylenol for the discomfort. Try to do other chair exercise to stay active since your walking is limited at present.  I discussed the assessment and treatment plan with the patient. The patient was provided an opportunity to ask questions and all were answered. The patient agreed with the plan and demonstrated an understanding of the instructions.   The patient was advised to call back or seek an in-person evaluation if the symptoms worsen or if the condition fails to improve as anticipated.  I provided  27 minutes of non-face-to-face time during this encounter.  Jalayah Gutridge L. Mialynn Shelvin, D.O. [facility] [address], [place] [address] Cell Phone (Mon-Fri 8am-5pm):  [phone] On Call:  [phone] & follow prompts after 5pm & weekends Office Phone:  [phone] Office Fax:  [phone]

## 2018-07-28 NOTE — Telephone Encounter (Signed)
Dr.Reed I placed this patient on your schedule for a telephone visit. Please advise if you have a different plan of action Thanks,  CB

## 2018-07-29 ENCOUNTER — Ambulatory Visit
Admission: RE | Admit: 2018-07-29 | Discharge: 2018-07-29 | Disposition: A | Discharge status: Home / Self Care | Payer: Medicare Other | Source: Ambulatory Visit | Attending: Internal Medicine | Admitting: Internal Medicine

## 2018-07-29 ENCOUNTER — Other Ambulatory Visit: Payer: Self-pay

## 2018-07-29 DIAGNOSIS — M79672 Pain in left foot: Secondary | ICD-10-CM | POA: Diagnosis not present

## 2018-07-30 ENCOUNTER — Ambulatory Visit (INDEPENDENT_AMBULATORY_CARE_PROVIDER_SITE_OTHER): Payer: Medicare Other | Admitting: *Deleted

## 2018-07-30 DIAGNOSIS — I4891 Unspecified atrial fibrillation: Secondary | ICD-10-CM | POA: Diagnosis not present

## 2018-07-30 DIAGNOSIS — Z5181 Encounter for therapeutic drug level monitoring: Secondary | ICD-10-CM

## 2018-07-30 DIAGNOSIS — Z8673 Personal history of transient ischemic attack (TIA), and cerebral infarction without residual deficits: Secondary | ICD-10-CM | POA: Diagnosis not present

## 2018-07-30 LAB — POCT INR: INR: 4.2 — AB (ref 2.0–3.0)

## 2018-07-30 NOTE — Patient Instructions (Signed)
Took coumadin this morning. Hold coumadin tomorrow (Wed) then decrease dose to 1 tablet daily except 1/2 tablet on Tuesdays and Fridays Recheck in 4 weeks Been taking Nyquil at night

## 2018-08-11 DIAGNOSIS — E1140 Type 2 diabetes mellitus with diabetic neuropathy, unspecified: Secondary | ICD-10-CM | POA: Diagnosis not present

## 2018-08-11 DIAGNOSIS — B351 Tinea unguium: Secondary | ICD-10-CM | POA: Diagnosis not present

## 2018-08-12 NOTE — Progress Notes (Addendum)
Virtual Visit via Phone Note The purpose of this virtual visit is to provide medical care while limiting exposure to the novel coronavirus.    Consent was obtained for video visit:  Yes.   Answered questions that patient had about telehealth interaction:  Yes.   I discussed the limitations, risks, security and privacy concerns of performing an evaluation and management service by telemedicine. I also discussed with the patient that there may be a patient responsible charge related to this service. The patient expressed understanding and agreed to proceed.  Pt location: Home Physician Location: Home Name of referring provider:  Gayland Curry, DO I connected with Jeanette Yates at patients initiation/request on 08/13/2018 at 10:50 AM EDT by video enabled telemedicine application and verified that I am speaking with the correct person using two identifiers. Pt MRN:  [id number] Pt DOB:  01-31-1941 Telephone Participants:  Jeanette Yates, her daughter   History of Present Illness:  Jeanette Yates is a 78 year old right-handed female with valvular heart disease status post porcine aortic valve repair and mitral valve repair with [employer] 3-D ring, CAD status post CABG, hypertension, hyperlipidemia, diabetes mellitus, former smoker and macular degeneration who follows up for left hemispheric TIA and right carotid artery stenosis.    UPDATE: Current medications:  ASA 81mg , Coumadin, Lipitor 80mg , Lopressor, Lasix, Diovan,.metformin, Trulicity  Carotid doppler from 10/08/17 showed similar large amount of right-sided atherosclerotic plaque in the right ICA with 50-69% stenosis as well as moderate to large amount of atherosclerotic plaque in the left ICA with no hemodynamically significant stenosis.  LDL from 01/06/18 was 61.  Hgb A1c from 04/24/18 was 7.9.  No new changes since last year.  No recurrent stroke-like events.  HISTORY: While visiting family in [place], [place] in June 2018, she had an  episode of sudden onset right sided numbness of the face, tongue, throat and fingers, lasting 10 to 20 minutes.  There was no associated unilateral weakness, slurred speech or facial droop (although her daughter reports th at her baseline left droopy eyelid seemed more pronounced for a few days afterwards).  For 24 hours prior to the event, she had a bifrontal/parietal 5/6 nonthrobbing headache.  She was brought to [facility] where she was worked up for TIA.  Blood pressure on arrival was reportedly elevated.  CT of head showed chronic encephalomalation in the right parietal region and right internal capsule, but no acute abnormality.  MRI of brain showed encephalomalacia in the right frontal and parietal lobes but no acute infarct or bleed.  Carotid doppler revealed 50-69% right ICA stenosis.  Echocardiogram demonstrated a LV EF of 65% with no cardiac source of emboli.  INR was 2.90 to 3.25, LDL 50, and Hgb A1c 6.5.  No changes were made to her medications.  Since then, she has been doing well.  She had a couple of those headaches again, but nothing severe.  Her blood pressure has been controlled.  She has not had any recurrent spells. She reports that she feels a little off-balance, leaning towards the right.  She did have a similar TIA a year ago, presenting the same with right sided numbness, however she had some drooping of the left upper face (eyelid and cheek).  She has peripheral vision loss due to macular degeneration.  She has remote history of migraines.  Past Medical History: Past Medical History:  Diagnosis Date  . Adjustment disorder 10/17/2005  . Atrial fibrillation ([place])   . Cataract  bilateral  . CHF (congestive heart failure) ([place])   . Contracture of knee joint 07/26/2009  . Coronary artery disease   . Degeneration of lumbar or lumbosacral intervertebral disc 07/26/2009  . Diabetes mellitus without complication ([place])   . Esophageal spasm   . Esophageal spasm    . Fibromyalgia   . Fibromyalgia   . Hearing loss 12/18/2010  . Heart murmur   . Hypercholesteremia   . Hypertension   . 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 Leaky heart valve   . Lyme disease   . Macular degeneration   . Macular degeneration of both eyes   . Migraine 10/17/2005  . Mixed incontinence 10/17/2005  . Osteoarthritis    multiple joints   . Overweight 09/19/2010  . Pneumonia   . PONV (postoperative nausea and vomiting)   . Postartificial menopausal syndrome 10/10/2009  . Prinzmetal angina ([place]) 10/17/2005  . Psoriasis 06/12/2006  . Sciatica   . TIA (transient ischemic attack)   . Type II diabetes mellitus ([place])   . [name] 10/17/2005    Medications: Outpatient Encounter Medications as of 08/13/2018  Medication Sig  . acetaminophen (TYLENOL) 650 MG CR tablet Take 650-1,300 mg by mouth every 8 (eight) hours as needed for pain.   . Aflibercept (EYLEA) 2 MG/0.05ML SOLN by Intravitreal route.  Marland Kitchen aspirin EC 81 MG tablet Take 81 mg by mouth every morning.   Marland Kitchen atorvastatin (LIPITOR) 80 MG tablet Take 1 tablet (80 mg total) by mouth daily.  . calcium citrate-vitamin D (CITRACAL+D) 315-200 MG-UNIT tablet Take 1 tablet by mouth 2 (two) times daily.  . cetirizine (ZYRTEC) 10 MG chewable tablet Chew 10 mg by mouth daily.  . fluticasone (FLONASE) 50 MCG/ACT nasal spray USE 2 SPRAYS IN EACH NOSTRIL EVERY DAY  . furosemide (LASIX) 20 MG tablet Take 1 tablet (20 mg total) by mouth daily.  Marland Kitchen gabapentin (NEURONTIN) 100 MG capsule Take 100 mg by mouth See admin instructions. Taking 2 tabs in morning and 3 tabs at night  . lisinopril (PRINIVIL,ZESTRIL) 10 MG tablet TAKE 1 TABLET EVERY DAY ( DOSE INCREASE )  . loperamide (IMODIUM A-D) 2 MG tablet Take 2 mg by mouth as needed for diarrhea or loose stools.  . metFORMIN (GLUCOPHAGE) 500 MG tablet Take 1 tablet (500 mg total) by mouth 2 (two) times daily with a meal.  . metoprolol tartrate (LOPRESSOR) 25 MG tablet  Take 0.5 tablets (12.5 mg total) by mouth 2 (two) times daily.  . potassium chloride (K-DUR,KLOR-CON) 10 MEQ tablet Take 1 tablet (10 mEq total) by mouth daily.  . Probiotic Product (FORTIFY DAILY PROBIOTIC PO) Take by mouth.  . Vitamin D, Ergocalciferol, (DRISDOL) 50000 units CAPS capsule Take 1 capsule (50,000 Units total) by mouth every 7 (seven) days.  Marland Kitchen warfarin (COUMADIN) 6 MG tablet TAKE 1 TABLET EVERY DAY   No facility-administered encounter medications on file as of 08/13/2018.     Allergies: Allergies  Allergen Reactions  . Penicillins Anaphylaxis and Other (See Comments)    Has patient had a PCN reaction causing immediate rash, facial/tongue/throat swelling, SOB or lightheadedness with hypotension: Yes Has patient had a PCN reaction causing severe rash involving mucus membranes or skin necrosis: Yes Has patient had a PCN reaction that required hospitalization Yes Has patient had a PCN reaction occurring within the last 10 years: No If all of the above answers are "NO", then may proceed with Cephalosporin use.   . Latex Other (See Comments)  Redness and rash  . Morphine And Related Nausea Only and Other (See Comments)    Dizziness  . Tape Other (See Comments)    Redness and rash  . Iodine Other (See Comments)    Redness and rash    Family History: Family History  Problem Relation Age of Onset  . Stroke Mother   . Heart failure Mother   . Heart disease Mother   . Mitral valve prolapse Mother   . Diabetes Father   . Heart attack Father   . Stroke Father   . Heart disease Father   . Hypertension Father   . Alzheimer's disease Father   . Stroke Maternal Grandmother   . Arthritis Maternal Grandfather        hands  . Breast cancer Paternal Grandmother   . Osteoporosis Paternal Grandmother   . Breast cancer Cousin   . Colon cancer Neg Hx     Social History: Social History   Socioeconomic History  . Marital status: Widowed    Spouse name: Not on file  .  Number of children: 3  . Years of education: Not on file  . Highest education level: Not on file  Occupational History  . Not on file  Social Needs  . Financial resource strain: Not hard at all  . Food insecurity:    Worry: Never true    Inability: Never true  . Transportation needs:    Medical: No    Non-medical: No  Tobacco Use  . Smoking status: Former Smoker    Packs/day: 1.00    Years: 20.00    Pack years: 20.00    Types: Cigarettes    Last attempt to quit: 09/12/1966    Years since quitting: 51.9  . Smokeless tobacco: Never Used  Substance and Sexual Activity  . Alcohol use: No  . Drug use: No  . Sexual activity: Never  Lifestyle  . Physical activity:    Days per week: 6 days    Minutes per session: 30 min  . Stress: Only a little  Relationships  . Social connections:    Talks on phone: More than three times a week    Gets together: More than three times a week    Attends religious service: More than 4 times per year    Active member of club or organization: No    Attends meetings of clubs or organizations: Never    Relationship status: Widowed  . Intimate partner violence:    Fear of current or ex partner: No    Emotionally abused: No    Physically abused: No    Forced sexual activity: No  Other Topics Concern  . Not on file  Social History Narrative  . Not on file    Observations/Objective:   Blood pressure (!) 200/103, pulse 69, height 5\' 2"  (1.575 m), weight 166 lb 3.2 oz (75.4 kg). No acute distress.  Alert and oriented.  Speech fluent and not dysarthric.  Language intact.    Assessment and Plan:   1.  TIA, left hemispheric.  Similar TIA presentation a year ago.  With associated headache and history of migraines, consider atypical migraine as well. 2.  Asymptomatic right sided carotid artery stenosis with incidental remote right frontal stroke (not clinical). 3.  Hyperlipidemia, adequately treated 4.  Hypertension, elevated today.  Usually  controlled.  She forgot to take her medication this morning and only took it on the way to this appointment. 5.  Type 2 diabetes mellitus  6.  Atrial fibrillation  PLAN: 1.  Continue Coumadin and ASA 81mg  daily  2.  Continue atorvastatin 80mg  daily.  LDL goal less than 70 3.  Recheck blood pressure at home.  If still elevated, recommended to contact PCP's office. 4. Optimize glycemic control 5.  Repeat carotid doppler ASAP and again in one year prior to follow up. 6.  Follow up in one year.  Follow Up Instructions:    -I discussed the assessment and treatment plan with the patient. The patient was provided an opportunity to ask questions and all were answered. The patient agreed with the plan and demonstrated an understanding of the instructions.   The patient was advised to call back or seek an in-person evaluation if the symptoms worsen or if the condition fails to improve as anticipated.    Total Time spent in visit with the patient was:  11 minutes  Dudley Major, DO

## 2018-08-13 ENCOUNTER — Telehealth (INDEPENDENT_AMBULATORY_CARE_PROVIDER_SITE_OTHER): Payer: Medicare Other | Admitting: Neurology

## 2018-08-13 ENCOUNTER — Other Ambulatory Visit: Payer: Self-pay

## 2018-08-13 ENCOUNTER — Encounter: Payer: Self-pay | Admitting: Neurology

## 2018-08-13 VITALS — BP 200/103 | HR 69 | Ht 62.0 in | Wt 166.2 lb

## 2018-08-13 DIAGNOSIS — I10 Essential (primary) hypertension: Secondary | ICD-10-CM

## 2018-08-13 DIAGNOSIS — E785 Hyperlipidemia, unspecified: Secondary | ICD-10-CM

## 2018-08-13 DIAGNOSIS — G459 Transient cerebral ischemic attack, unspecified: Secondary | ICD-10-CM

## 2018-08-13 DIAGNOSIS — I4891 Unspecified atrial fibrillation: Secondary | ICD-10-CM

## 2018-08-13 DIAGNOSIS — I6521 Occlusion and stenosis of right carotid artery: Secondary | ICD-10-CM

## 2018-08-13 DIAGNOSIS — E119 Type 2 diabetes mellitus without complications: Secondary | ICD-10-CM

## 2018-08-13 NOTE — Addendum Note (Signed)
Addended by: Clois Comber on: 08/13/2018 11:34 AM   Modules accepted: Orders

## 2018-08-19 ENCOUNTER — Ambulatory Visit: Payer: Medicare Other | Admitting: Neurology

## 2018-08-27 ENCOUNTER — Ambulatory Visit (INDEPENDENT_AMBULATORY_CARE_PROVIDER_SITE_OTHER): Payer: Medicare Other | Admitting: *Deleted

## 2018-08-27 DIAGNOSIS — I4891 Unspecified atrial fibrillation: Secondary | ICD-10-CM

## 2018-08-27 DIAGNOSIS — Z8673 Personal history of transient ischemic attack (TIA), and cerebral infarction without residual deficits: Secondary | ICD-10-CM | POA: Diagnosis not present

## 2018-08-27 DIAGNOSIS — Z5181 Encounter for therapeutic drug level monitoring: Secondary | ICD-10-CM | POA: Diagnosis not present

## 2018-08-27 LAB — POCT INR: INR: 1.7 — AB (ref 2.0–3.0)

## 2018-08-27 NOTE — Patient Instructions (Signed)
Took coumadin this morning. Take extra 1/2 tablet coumadin today then increase dose to 1 tablet daily except 1/2 tablet on Tuesdays  Recheck in 4 weeks

## 2018-09-02 DIAGNOSIS — H353221 Exudative age-related macular degeneration, left eye, with active choroidal neovascularization: Secondary | ICD-10-CM | POA: Diagnosis not present

## 2018-09-04 ENCOUNTER — Other Ambulatory Visit: Payer: Self-pay

## 2018-09-04 ENCOUNTER — Encounter: Payer: Self-pay | Admitting: Internal Medicine

## 2018-09-04 ENCOUNTER — Ambulatory Visit (INDEPENDENT_AMBULATORY_CARE_PROVIDER_SITE_OTHER): Payer: Medicare Other | Admitting: Internal Medicine

## 2018-09-04 VITALS — BP 148/80 | HR 57 | Temp 98.3°F | Ht 62.0 in | Wt 165.0 lb

## 2018-09-04 DIAGNOSIS — E1140 Type 2 diabetes mellitus with diabetic neuropathy, unspecified: Secondary | ICD-10-CM | POA: Diagnosis not present

## 2018-09-04 DIAGNOSIS — Z6830 Body mass index (BMI) 30.0-30.9, adult: Secondary | ICD-10-CM | POA: Diagnosis not present

## 2018-09-04 DIAGNOSIS — I6521 Occlusion and stenosis of right carotid artery: Secondary | ICD-10-CM

## 2018-09-04 DIAGNOSIS — E6609 Other obesity due to excess calories: Secondary | ICD-10-CM | POA: Diagnosis not present

## 2018-09-04 DIAGNOSIS — I5032 Chronic diastolic (congestive) heart failure: Secondary | ICD-10-CM

## 2018-09-04 DIAGNOSIS — I10 Essential (primary) hypertension: Secondary | ICD-10-CM

## 2018-09-04 DIAGNOSIS — E66811 Other obesity due to excess calories: Secondary | ICD-10-CM

## 2018-09-04 MED ORDER — METFORMIN HCL 1000 MG PO TABS: 1000.0000 mg | ORAL_TABLET | Freq: Two times a day (BID) | ORAL | 3 refills | Status: DC

## 2018-09-04 MED ORDER — LISINOPRIL 20 MG PO TABS: 20.0000 mg | ORAL_TABLET | Freq: Every day | ORAL | 3 refills | Status: DC

## 2018-09-04 NOTE — Progress Notes (Signed)
Location:  [facility] clinic Provider:  Neeva Trew L. Mariea Clonts, D.O., C.M.D.  Goals of Care:  Advanced Directives 08/13/2018  Does Patient Have a Medical Advance Directive? Yes  Type of Paramedic of [place];Living will  Does patient want to make changes to medical advance directive? No - Patient declined  Copy of [place] in Chart? Yes - validated most recent copy scanned in chart (See row information)  Would patient like information on creating a medical advance directive? -   Chief Complaint  Patient presents with  . Medical Management of Chronic Issues    65mth follow-up    HPI: Patient is a 78 y.o. female seen today for medical management of chronic diseases.    Pt and her daughter, [name], have a long list of concerns today:  HTN:  Not controlled.  Earlier it was 148/80 by CMA and 118/84 on their machine--cuff was loose.  These were sitting. BP then was 180/89 when she checked it standing later in the left arm and 180/98 when I did it manually in that same position.  Discussed proper positioning to check BPs--seated with arm relaxed in front, proper placement over brachial artery, legs not crossed, etc.    Fasting CBGs ranging from 108-165.  Mostly 130s-160s.  Pt only checking once a day now, but agrees to go to 4x per day now.  She also agrees to keep a food diary with type of foods eaten and timing vs. Cbgs.  They ask about a med change and pt admits to not taking her metformin always with food.  She's not exercising much due to dyspnea being worse since [facility] (did water aerobics and weights program there).  She gets dyspneic with just 30s on the elliptical that they have.  She does wear a fitbit tracker that goes off when she is inactive.    She is getting headaches in her right temple.  She's using zyrtec and flonase for her allergies, but still getting headaches.  Discussed role of bp being high.   She and her daughter are signed  up to get covid antibody testing at [facility] in [place]--requested they supply me with those results when done for her record.    Her neuropathic foot pain is getting worse.  Bothered by weather, but there any time--not particularly worse any certain time of day.  Cannot safely take more gabapentin due to dizziness and fatigue it causes.  She went to get diabetic shoes at [facility] but then wouldn't sign the form saying she'd pay if medicare didn't so she says they never heard if medicare preapproved them.  They now request an Rx to [facility] for diabetic shoes.    Past Medical History:  Diagnosis Date  . Adjustment disorder 10/17/2005  . Atrial fibrillation ([place])   . Cataract    bilateral  . CHF (congestive heart failure) ([place])   . Contracture of knee joint 07/26/2009  . Coronary artery disease   . Degeneration of lumbar or lumbosacral intervertebral disc 07/26/2009  . Diabetes mellitus without complication ([place])   . Esophageal spasm   . Esophageal spasm   . Fibromyalgia   . Fibromyalgia   . Hearing loss 12/18/2010  . Heart murmur   . Hypercholesteremia   . Hypertension   . 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 Leaky heart valve   . Lyme disease   . Macular degeneration   . Macular degeneration of both  eyes   . Migraine 10/17/2005  . Mixed incontinence 10/17/2005  . Osteoarthritis    multiple joints   . Overweight 09/19/2010  . Pneumonia   . PONV (postoperative nausea and vomiting)   . Postartificial menopausal syndrome 10/10/2009  . Prinzmetal angina ([place]) 10/17/2005  . Psoriasis 06/12/2006  . Sciatica   . TIA (transient ischemic attack)   . Type II diabetes mellitus ([place])   . [name] 10/17/2005    Past Surgical History:  Procedure Laterality Date  . ABDOMINAL HYSTERECTOMY    . AORTIC VALVE REPLACEMENT  12/10/2013   porcine  . CATARACT EXTRACTION, BILATERAL    . CHOLECYSTECTOMY  1981  . COLONOSCOPY  04/18/2006    Dr. Nelva Nay: internal hemorrhoids  . COLONOSCOPY  10/2003   Dr. Alphonsa Gin: hemorrhoids  . CORONARY ARTERY BYPASS GRAFT  2015  . ESOPHAGOGASTRODUODENOSCOPY  10/16/2013   Dr. Marla Roe: prior Nissen fundoplication intact, hypertonic LES, dilated up to 96 [place] with moderate resistance, gastritis but no H. pylori., Reactive gastritis, no celiac disease.  . ESOPHAGOGASTRODUODENOSCOPY  05/29/2012   Dr. Doy Mince: Moderately severe esophagitis, acute gastritis reactive, no H pylori, no celiac. Esophageal biopsies consistent with GERD, no Barrett  . ESOPHAGOGASTRODUODENOSCOPY  03/21/2009   Dr. Doy Mince: Reflux esophagitis, gastritis without H. pylori, esophagus stretched 57 savory  . ESOPHAGOGASTRODUODENOSCOPY  02/20/2008   Dr. Doy Mince: Esophagus dilated to 25 French, reactive gastropathy with no H pylori. No Barrett's on esophageal biopsy  . ESOPHAGOGASTRODUODENOSCOPY  09/24/2006   Dr. Doy Mince: Tight wrap noted, reactive gastropathy, no Barrett's  . ESOPHAGOGASTRODUODENOSCOPY (EGD) WITH PROPOFOL N/A 01/09/2017   Procedure: ESOPHAGOGASTRODUODENOSCOPY (EGD) WITH PROPOFOL;  Surgeon: Daneil Dolin, MD;  Location: [facility];  Service: Endoscopy;  Laterality: N/A;  2:15PM  . FRACTURE SURGERY Left    wrist  . JOINT REPLACEMENT    . MALONEY DILATION N/A 01/09/2017   Procedure: Venia Minks DILATION;  Surgeon: Daneil Dolin, MD;  Location: [facility];  Service: Endoscopy;  Laterality: N/A;  . MITRAL VALVE REPAIR  11/2013  . NISSEN FUNDOPLICATION  4540  . REPLACEMENT TOTAL KNEE BILATERAL  2010/2013    Allergies  Allergen Reactions  . Penicillins Anaphylaxis and Other (See Comments)    Has patient had a PCN reaction causing immediate rash, facial/tongue/throat swelling, SOB or lightheadedness with hypotension: Yes Has patient had a PCN reaction causing severe rash involving mucus membranes or skin necrosis: Yes Has patient had a PCN reaction that required hospitalization Yes Has patient  had a PCN reaction occurring within the last 10 years: No If all of the above answers are "NO", then may proceed with Cephalosporin use.   . Latex Other (See Comments)    Redness and rash  . Morphine And Related Nausea Only and Other (See Comments)    Dizziness  . Tape Other (See Comments)    Redness and rash  . Iodine Other (See Comments)    Redness and rash    Outpatient Encounter Medications as of 09/04/2018  Medication Sig  . acetaminophen (TYLENOL) 650 MG CR tablet Take 650-1,300 mg by mouth every 8 (eight) hours as needed for pain.   . Aflibercept (EYLEA) 2 MG/0.05ML SOLN by Intravitreal route.  Marland Kitchen aspirin EC 81 MG tablet Take 81 mg by mouth every morning.   Marland Kitchen atorvastatin (LIPITOR) 80 MG tablet Take 1 tablet (80 mg total) by mouth daily.  . calcium citrate-vitamin D (CITRACAL+D) 315-200 MG-UNIT tablet Take 1 tablet by mouth 2 (two) times daily.  . cetirizine (  ZYRTEC) 10 MG chewable tablet Chew 10 mg by mouth daily.  . fluticasone (FLONASE) 50 MCG/ACT nasal spray USE 2 SPRAYS IN EACH NOSTRIL EVERY DAY  . furosemide (LASIX) 20 MG tablet Take 1 tablet (20 mg total) by mouth daily.  Marland Kitchen gabapentin (NEURONTIN) 100 MG capsule Take 100 mg by mouth See admin instructions. Taking 2 tabs in morning and 3 tabs at night  . lisinopril (PRINIVIL,ZESTRIL) 10 MG tablet TAKE 1 TABLET EVERY DAY ( DOSE INCREASE )  . loperamide (IMODIUM A-D) 2 MG tablet Take 2 mg by mouth as needed for diarrhea or loose stools.  . metFORMIN (GLUCOPHAGE) 500 MG tablet Take 1 tablet (500 mg total) by mouth 2 (two) times daily with a meal.  . metoprolol tartrate (LOPRESSOR) 25 MG tablet Take 0.5 tablets (12.5 mg total) by mouth 2 (two) times daily.  . potassium chloride (K-DUR,KLOR-CON) 10 MEQ tablet Take 1 tablet (10 mEq total) by mouth daily.  . Probiotic Product (FORTIFY DAILY PROBIOTIC PO) Take by mouth.  . Vitamin D, Ergocalciferol, (DRISDOL) 50000 units CAPS capsule Take 1 capsule (50,000 Units total) by mouth every  7 (seven) days.  Marland Kitchen warfarin (COUMADIN) 6 MG tablet TAKE 1 TABLET EVERY DAY   No facility-administered encounter medications on file as of 09/04/2018.     Review of Systems:  Review of Systems  Constitutional: Positive for malaise/fatigue. Negative for chills, fever and weight loss.  Eyes: Positive for blurred vision.       Unable to see place to insert test strips into machine anymore  Respiratory: Positive for shortness of breath. Negative for cough.   Cardiovascular: Negative for chest pain, palpitations and leg swelling.  Gastrointestinal: Negative for abdominal pain.  Genitourinary: Negative for dysuria.  Musculoskeletal: Positive for back pain and myalgias. Negative for falls.  Skin: Negative for itching and rash.  Neurological: Positive for tingling and sensory change. Negative for dizziness and loss of consciousness.       Neuropathy in feet  Endo/Heme/Allergies: Bruises/bleeds easily.  Psychiatric/Behavioral: Positive for memory loss. Negative for depression. The patient is not nervous/anxious and does not have insomnia.     Health Maintenance  Topic Date Due  . FOOT EXAM  09/06/2018  . HEMOGLOBIN A1C  10/23/2018  . INFLUENZA VACCINE  10/25/2018  . OPHTHALMOLOGY EXAM  05/14/2019  . COLONOSCOPY  03/26/2022  . TETANUS/TDAP  10/13/2026  . DEXA SCAN  Completed  . PNA vac Low Risk Adult  Completed    Physical Exam: Vitals:   09/04/18 0830  BP: (!) 148/80  Pulse: (!) 57  Temp: 98.3 F (36.8 C)  TempSrc: Oral  SpO2: 97%  Weight: 165 lb (74.8 kg)  Height: 5\' 2"  (1.575 m)   Body mass index is 30.18 kg/m. Physical Exam Vitals signs reviewed.  Constitutional:      General: She is not in acute distress.    Appearance: Normal appearance. She is obese. She is not ill-appearing or toxic-appearing.  HENT:     Head: Normocephalic and atraumatic.  Eyes:     Comments: Vision poor  Cardiovascular:     Rate and Rhythm: Normal rate and regular rhythm.     Pulses: Normal  pulses.     Heart sounds: Normal heart sounds.  Pulmonary:     Effort: Pulmonary effort is normal.     Breath sounds: Normal breath sounds.  Musculoskeletal: Normal range of motion.  Skin:    General: Skin is warm and dry.  Neurological:     General:  No focal deficit present.     Mental Status: She is alert and oriented to person, place, and time.     Comments: Some short term memory loss in reference to meds for example  Psychiatric:        Mood and Affect: Mood normal.     Labs reviewed: Basic Metabolic Panel: Recent Labs    10/08/17 1206 01/06/18 0820 04/24/18 1615  NA 141 140 139  K 4.1 4.4 4.5  CL 104 106 101  CO2 28 28 28   GLUCOSE 156* 124* 130  BUN 23 19 25   CREATININE 0.97 0.79 0.96*  CALCIUM 9.4 9.5 9.9   Liver Function Tests: Recent Labs    01/06/18 0820 04/24/18 1615  AST 21 21  ALT 17 17  ALKPHOS 79  --   BILITOT 0.7 0.4  PROT 7.5 7.3  ALBUMIN 3.9  --    No results for input(s): LIPASE, AMYLASE in the last 8760 hours. No results for input(s): AMMONIA in the last 8760 hours. CBC: Recent Labs    01/06/18 0820 04/24/18 1615  WBC 9.9 11.2*  NEUTROABS 6.5 9,475*  HGB 12.3 13.4  HCT 39.2 40.2  MCV 88.7 84.6  PLT 395 393   Lipid Panel: Recent Labs    01/06/18 0820  CHOL 131  HDL 35*  LDLCALC 61  TRIG 173*  CHOLHDL 3.7   Lab Results  Component Value Date   HGBA1C 7.9 (H) 04/24/2018    Procedures since last visit: No results found.  Assessment/Plan 1. Type 2 diabetes mellitus with chronic painful diabetic neuropathy (HCC) - CBGs high fasting -check cbgs qid  -keep food diary  -start exercise program (instructions) -increase metformin - metFORMIN (GLUCOPHAGE) 1000 MG tablet; Take 1 tablet (1,000 mg total) by mouth 2 (two) times daily with a meal.  Dispense: 180 tablet; Refill: 3 - CBC with Differential/Platelet - COMPLETE METABOLIC PANEL WITH GFR - Hemoglobin A1c - Lipid panel  2. Essential hypertension -increase lisinopril  -check bps seated with arm relaxed, legs not crossed - lisinopril (ZESTRIL) 20 MG tablet; Take 1 tablet (20 mg total) by mouth daily.  Dispense: 90 tablet; Refill: 3 - COMPLETE METABOLIC PANEL WITH GFR  3. Body mass index (BMI) of 30.0-30.9 in adult -remains high -resume an exercise program and ramp up as per instructions  4. Chronic diastolic congestive heart failure (HCC) - no evidence of acute exacerbation, cont current regimen - COMPLETE METABOLIC PANEL WITH GFR -seems dyspnea is based on deconditioning  5. Class 1 obesity due to excess calories with serious comorbidity and body mass index (BMI) of 30.0 to 30.9 in adult -again, aware she's gaining weight, get back on track and hopefully soon will be able to do water aerobics in some format b/c it sounds like elliptical is going to be very challenging.  Labs/tests ordered: lipid, hba1c, cbc, cmp today Next appt:  10/16/2018 f/u on bp and sugars  Kima Malenfant L. Fiona Coto, D.O. [facility] [address], [place] [address] Cell Phone (Mon-Fri 8am-5pm):  [phone] On Call:  [phone] & follow prompts after 5pm & weekends Office Phone:  [phone] Office Fax:  [phone]

## 2018-09-04 NOTE — Patient Instructions (Addendum)
Increase lisinopril to 20mg  daily. We will try to get you a new glucometer.  In the interim, please check your sugar 4 times a day (before meals and at bedtime).   Use a food diary with the timing and type of food you eat. Exercise with the elliptical--try to use it for one minute three times per day and then gradually increase by 30 seconds until you reach 20 mins 5 days a week. Try capsaicin cream for your neuropathy in your feet (per the instructions on the tube).  It's over the counter.

## 2018-09-05 LAB — COMPLETE METABOLIC PANEL WITH GFR
AG Ratio: 1.5 (calc) (ref 1.0–2.5)
ALT: 14 U/L (ref 6–29)
AST: 19 U/L (ref 10–35)
Albumin: 4.1 g/dL (ref 3.6–5.1)
Alkaline phosphatase (APISO): 84 U/L (ref 37–153)
BUN: 18 mg/dL (ref 7–25)
CO2: 28 mmol/L (ref 20–32)
Calcium: 9.4 mg/dL (ref 8.6–10.4)
Chloride: 103 mmol/L (ref 98–110)
Creat: 0.9 mg/dL (ref 0.60–0.93)
GFR, Est African American: 72 mL/min/{1.73_m2} (ref >=60)
GFR, Est Non African American: 63 mL/min/{1.73_m2} (ref >=60)
Globulin: 2.8 g/dL (calc) (ref 1.9–3.7)
Glucose, Bld: 118 mg/dL — ABNORMAL HIGH (ref 65–99)
Potassium: 4.8 mmol/L (ref 3.5–5.3)
Sodium: 140 mmol/L (ref 135–146)
Total Bilirubin: 0.6 mg/dL (ref 0.2–1.2)
Total Protein: 6.9 g/dL (ref 6.1–8.1)

## 2018-09-05 LAB — CBC WITH DIFFERENTIAL/PLATELET
Absolute Monocytes: 728 cells/uL (ref 200–950)
Basophils Absolute: 68 cells/uL (ref 0–200)
Basophils Relative: 0.7 %
Eosinophils Absolute: 340 cells/uL (ref 15–500)
Eosinophils Relative: 3.5 %
HCT: 40 % (ref 35.0–45.0)
Hemoglobin: 13.1 g/dL (ref 11.7–15.5)
Lymphs Abs: 1630 cells/uL (ref 850–3900)
MCH: 28.2 pg (ref 27.0–33.0)
MCHC: 32.8 g/dL (ref 32.0–36.0)
MCV: 86.2 fL (ref 80.0–100.0)
MPV: 10.4 fL (ref 7.5–12.5)
Monocytes Relative: 7.5 %
Neutro Abs: 6936 cells/uL (ref 1500–7800)
Neutrophils Relative %: 71.5 %
Platelets: 412 10*3/uL — ABNORMAL HIGH (ref 140–400)
RBC: 4.64 10*6/uL (ref 3.80–5.10)
RDW: 13.3 % (ref 11.0–15.0)
Total Lymphocyte: 16.8 %
WBC: 9.7 10*3/uL (ref 3.8–10.8)

## 2018-09-05 LAB — LIPID PANEL
Cholesterol: 142 mg/dL (ref <200)
HDL: 40 mg/dL — ABNORMAL LOW (ref >=50)
LDL Cholesterol (Calc): 74 mg/dL (calc)
Non-HDL Cholesterol (Calc): 102 mg/dL (calc) (ref <130)
Total CHOL/HDL Ratio: 3.6 (calc) (ref <5.0)
Triglycerides: 183 mg/dL — ABNORMAL HIGH (ref <150)

## 2018-09-05 LAB — HEMOGLOBIN A1C
Hgb A1c MFr Bld: 7.7 % of total Hgb — ABNORMAL HIGH (ref <5.7)
Mean Plasma Glucose: 174 (calc)
eAG (mmol/L): 9.7 (calc)

## 2018-09-23 ENCOUNTER — Telehealth: Payer: Self-pay | Admitting: *Deleted

## 2018-09-23 NOTE — Telephone Encounter (Signed)

## 2018-09-24 ENCOUNTER — Ambulatory Visit (INDEPENDENT_AMBULATORY_CARE_PROVIDER_SITE_OTHER): Payer: Medicare Other | Admitting: *Deleted

## 2018-09-24 ENCOUNTER — Other Ambulatory Visit: Payer: Self-pay

## 2018-09-24 DIAGNOSIS — Z8673 Personal history of transient ischemic attack (TIA), and cerebral infarction without residual deficits: Secondary | ICD-10-CM | POA: Diagnosis not present

## 2018-09-24 DIAGNOSIS — I4891 Unspecified atrial fibrillation: Secondary | ICD-10-CM | POA: Diagnosis not present

## 2018-09-24 DIAGNOSIS — Z5181 Encounter for therapeutic drug level monitoring: Secondary | ICD-10-CM

## 2018-09-24 LAB — POCT INR: INR: 2 (ref 2.0–3.0)

## 2018-09-24 NOTE — Patient Instructions (Signed)
Took coumadin this morning. Continue coumadin 1 tablet daily except 1/2 tablet on Tuesdays  Recheck in 4 weeks

## 2018-10-13 ENCOUNTER — Encounter: Payer: Self-pay | Admitting: Internal Medicine

## 2018-10-13 NOTE — Telephone Encounter (Signed)
Routed to Reed, Tiffany L, DO  

## 2018-10-14 ENCOUNTER — Other Ambulatory Visit: Payer: Self-pay | Admitting: Internal Medicine

## 2018-10-14 DIAGNOSIS — I10 Essential (primary) hypertension: Secondary | ICD-10-CM

## 2018-10-14 MED ORDER — LISINOPRIL 20 MG PO TABS: 20.0000 mg | ORAL_TABLET | Freq: Every day | ORAL | 3 refills | Status: DC

## 2018-10-16 ENCOUNTER — Ambulatory Visit: Payer: Medicare Other | Admitting: Internal Medicine

## 2018-10-22 ENCOUNTER — Other Ambulatory Visit: Payer: Self-pay

## 2018-10-22 ENCOUNTER — Ambulatory Visit (INDEPENDENT_AMBULATORY_CARE_PROVIDER_SITE_OTHER): Payer: Medicare Other | Admitting: *Deleted

## 2018-10-22 DIAGNOSIS — Z03818 Encounter for observation for suspected exposure to other biological agents ruled out: Secondary | ICD-10-CM | POA: Diagnosis not present

## 2018-10-22 DIAGNOSIS — Z5181 Encounter for therapeutic drug level monitoring: Secondary | ICD-10-CM

## 2018-10-22 DIAGNOSIS — I4891 Unspecified atrial fibrillation: Secondary | ICD-10-CM

## 2018-10-22 DIAGNOSIS — Z8673 Personal history of transient ischemic attack (TIA), and cerebral infarction without residual deficits: Secondary | ICD-10-CM

## 2018-10-22 LAB — POCT INR: INR: 2.1 (ref 2.0–3.0)

## 2018-10-22 NOTE — Patient Instructions (Signed)
Took coumadin this morning. Continue coumadin 1 tablet daily except 1/2 tablet on Tuesdays  Recheck in 4 weeks

## 2018-10-23 ENCOUNTER — Encounter: Payer: Self-pay | Admitting: Internal Medicine

## 2018-10-28 ENCOUNTER — Other Ambulatory Visit: Payer: Self-pay

## 2018-10-28 ENCOUNTER — Encounter: Payer: Self-pay | Admitting: Sports Medicine

## 2018-10-28 ENCOUNTER — Ambulatory Visit (INDEPENDENT_AMBULATORY_CARE_PROVIDER_SITE_OTHER): Payer: Medicare Other | Admitting: Sports Medicine

## 2018-10-28 VITALS — BP 155/79 | HR 60 | Temp 97.9°F

## 2018-10-28 DIAGNOSIS — B351 Tinea unguium: Secondary | ICD-10-CM

## 2018-10-28 DIAGNOSIS — M722 Plantar fascial fibromatosis: Secondary | ICD-10-CM | POA: Diagnosis not present

## 2018-10-28 DIAGNOSIS — M79675 Pain in left toe(s): Secondary | ICD-10-CM

## 2018-10-28 DIAGNOSIS — E1142 Type 2 diabetes mellitus with diabetic polyneuropathy: Secondary | ICD-10-CM

## 2018-10-28 DIAGNOSIS — M79674 Pain in right toe(s): Secondary | ICD-10-CM

## 2018-10-28 DIAGNOSIS — I6521 Occlusion and stenosis of right carotid artery: Secondary | ICD-10-CM | POA: Diagnosis not present

## 2018-10-28 NOTE — Progress Notes (Signed)
Subjective: Jeanette Yates is a 78 y.o. female patient with history of diabetes who presents to office today complaining of long,mildly painful nails  while ambulating in shoes; unable to trim. Patient states that the glucose reading this morning was 120 mg/dl. Patient denies any new changes in medication or new problems. Patient denies admits cramping, numbness, burning or tingling in the legs and would like diabetic shoes.  Review of Systems  All other systems reviewed and are negative.    Patient Active Problem List   Diagnosis Date Noted  . Grief reaction with prolonged bereavement 04/24/2018  . Senile osteopenia 04/24/2018  . Injury of right wrist 04/24/2018  . Plantar fasciitis, bilateral 04/24/2018  . Irritable bowel syndrome with both constipation and diarrhea 04/24/2018  . Lactose intolerance 04/24/2018  . Influenza 04/24/2018  . Body mass index (BMI) of 30.0-30.9 in adult 09/05/2017  . Class 1 obesity due to excess calories with serious comorbidity and body mass index (BMI) of 30.0 to 30.9 in adult 09/05/2017  . Seasonal allergies 09/05/2017  . Hoarseness 09/05/2017  . Hyperlipidemia associated with type 2 diabetes mellitus ([place]) 09/05/2017  . Balance problem 02/11/2017  . Dysphagia 11/15/2016  . S/P AVR (aortic valve replacement) 04/13/2016  . Encounter for therapeutic drug monitoring 01/02/2016  . Hx of TIA (transient ischemic attack) and stroke 12/06/2015  . Type 2 diabetes mellitus with complication, without long-term current use of insulin ([place])   . Osteoarthritis   . Fibromyalgia   . Atrial fibrillation ([place])   . CHF (congestive heart failure) ([place])   . Esophageal spasm   . Numbness on right side 05/03/2015  . TIA (transient ischemic attack) 05/02/2015  . Essential hypertension 05/02/2015   Current Outpatient Medications on File Prior to Visit  Medication Sig Dispense Refill  . acetaminophen (TYLENOL) 650 MG CR tablet Take 650-1,300 mg by mouth every 8 (eight)  hours as needed for pain.     . Aflibercept (EYLEA) 2 MG/0.05ML SOLN by Intravitreal route.    Marland Kitchen aspirin EC 81 MG tablet Take 81 mg by mouth every morning.     Marland Kitchen atorvastatin (LIPITOR) 80 MG tablet Take 1 tablet (80 mg total) by mouth daily. 90 tablet 1  . calcium citrate-vitamin D (CITRACAL+D) 315-200 MG-UNIT tablet Take 1 tablet by mouth 2 (two) times daily.    . cetirizine (ZYRTEC) 10 MG chewable tablet Chew 10 mg by mouth daily.    . fluticasone (FLONASE) 50 MCG/ACT nasal spray USE 2 SPRAYS IN EACH NOSTRIL EVERY DAY 48 g 0  . furosemide (LASIX) 20 MG tablet Take 1 tablet (20 mg total) by mouth daily. 90 tablet 1  . gabapentin (NEURONTIN) 100 MG capsule Take 100 mg by mouth See admin instructions. Taking 2 tabs in morning and 3 tabs at night    . lisinopril (ZESTRIL) 20 MG tablet Take 1 tablet (20 mg total) by mouth daily. 90 tablet 3  . loperamide (IMODIUM A-D) 2 MG tablet Take 2 mg by mouth as needed for diarrhea or loose stools.    . metFORMIN (GLUCOPHAGE) 1000 MG tablet Take 1 tablet (1,000 mg total) by mouth 2 (two) times daily with a meal. 180 tablet 3  . metoprolol tartrate (LOPRESSOR) 25 MG tablet Take 0.5 tablets (12.5 mg total) by mouth 2 (two) times daily. 90 tablet 1  . potassium chloride (K-DUR,KLOR-CON) 10 MEQ tablet Take 1 tablet (10 mEq total) by mouth daily. 90 tablet 1  . Probiotic Product (FORTIFY DAILY PROBIOTIC PO) Take by  mouth.    . tobramycin (TOBREX) 0.3 % ophthalmic solution     . Vitamin D, Ergocalciferol, (DRISDOL) 50000 units CAPS capsule Take 1 capsule (50,000 Units total) by mouth every 7 (seven) days. 12 capsule 3  . warfarin (COUMADIN) 6 MG tablet TAKE 1 TABLET EVERY DAY 90 tablet 3   No current facility-administered medications on file prior to visit.    Allergies  Allergen Reactions  . Penicillins Anaphylaxis and Other (See Comments)    Has patient had a PCN reaction causing immediate rash, facial/tongue/throat swelling, SOB or lightheadedness with  hypotension: Yes Has patient had a PCN reaction causing severe rash involving mucus membranes or skin necrosis: Yes Has patient had a PCN reaction that required hospitalization Yes Has patient had a PCN reaction occurring within the last 10 years: No If all of the above answers are "NO", then may proceed with Cephalosporin use.   . Latex Other (See Comments)    Redness and rash  . Morphine And Related Nausea Only and Other (See Comments)    Dizziness  . Tape Other (See Comments)    Redness and rash  . Iodine Other (See Comments)    Redness and rash    Recent Results (from the past 2160 hour(s))  POCT INR     Status: Abnormal   Collection Time: 07/30/18  1:35 PM  Result Value Ref Range   INR 4.2 (A) 2.0 - 3.0  POCT INR     Status: Abnormal   Collection Time: 08/27/18 12:06 PM  Result Value Ref Range   INR 1.7 (A) 2.0 - 3.0  CBC with Differential/Platelet     Status: Abnormal   Collection Time: 09/04/18  9:39 AM  Result Value Ref Range   WBC 9.7 3.8 - 10.8 Thousand/uL   RBC 4.64 3.80 - 5.10 Million/uL   Hemoglobin 13.1 11.7 - 15.5 g/dL   HCT 40.0 35.0 - 45.0 %   MCV 86.2 80.0 - 100.0 fL   MCH 28.2 27.0 - 33.0 pg   MCHC 32.8 32.0 - 36.0 g/dL   RDW 13.3 11.0 - 15.0 %   Platelets 412 (H) 140 - 400 Thousand/uL   MPV 10.4 7.5 - 12.5 fL   Neutro Abs 6,936 1,500 - 7,800 cells/uL   Lymphs Abs 1,630 850 - 3,900 cells/uL   Absolute Monocytes 728 200 - 950 cells/uL   Eosinophils Absolute 340 15 - 500 cells/uL   Basophils Absolute 68 0 - 200 cells/uL   Neutrophils Relative % 71.5 %   Total Lymphocyte 16.8 %   Monocytes Relative 7.5 %   Eosinophils Relative 3.5 %   Basophils Relative 0.7 %  COMPLETE METABOLIC PANEL WITH GFR     Status: Abnormal   Collection Time: 09/04/18  9:39 AM  Result Value Ref Range   Glucose, Bld 118 (H) 65 - 99 mg/dL    Comment: .            Fasting reference interval . For someone without known diabetes, a glucose value between 100 and 125 mg/dL is  consistent with prediabetes and should be confirmed with a follow-up test. .    BUN 18 7 - 25 mg/dL   Creat 0.90 0.60 - 0.93 mg/dL    Comment: For patients >41 years of age, the reference limit for Creatinine is approximately 13% higher for people identified as African-American. .    GFR, Est Non African American 63 > OR = 60 mL/min/1.31m2   GFR, Est African American 72 >  OR = 60 mL/min/1.10m2   BUN/Creatinine Ratio NOT APPLICABLE 6 - 22 (calc)   Sodium 140 135 - 146 mmol/L   Potassium 4.8 3.5 - 5.3 mmol/L   Chloride 103 98 - 110 mmol/L   CO2 28 20 - 32 mmol/L   Calcium 9.4 8.6 - 10.4 mg/dL   Total Protein 6.9 6.1 - 8.1 g/dL   Albumin 4.1 3.6 - 5.1 g/dL   Globulin 2.8 1.9 - 3.7 g/dL (calc)   AG Ratio 1.5 1.0 - 2.5 (calc)   Total Bilirubin 0.6 0.2 - 1.2 mg/dL   Alkaline phosphatase (APISO) 84 37 - 153 U/L   AST 19 10 - 35 U/L   ALT 14 6 - 29 U/L  Hemoglobin A1c     Status: Abnormal   Collection Time: 09/04/18  9:39 AM  Result Value Ref Range   Hgb A1c MFr Bld 7.7 (H) <5.7 % of total Hgb    Comment: For someone without known diabetes, a hemoglobin A1c value of 6.5% or greater indicates that they may have  diabetes and this should be confirmed with a follow-up  test. . For someone with known diabetes, a value <7% indicates  that their diabetes is well controlled and a value  greater than or equal to 7% indicates suboptimal  control. A1c targets should be individualized based on  duration of diabetes, age, comorbid conditions, and  other considerations. . Currently, no consensus exists regarding use of hemoglobin A1c for diagnosis of diabetes for children. .    Mean Plasma Glucose 174 (calc)   eAG (mmol/L) 9.7 (calc)  Lipid panel     Status: Abnormal   Collection Time: 09/04/18  9:39 AM  Result Value Ref Range   Cholesterol 142 <200 mg/dL   HDL 40 (L) > OR = 50 mg/dL   Triglycerides 183 (H) <150 mg/dL   LDL Cholesterol (Calc) 74 mg/dL (calc)    Comment: Reference  range: <100 . Desirable range <100 mg/dL for primary prevention;   <70 mg/dL for patients with CHD or diabetic patients  with > or = 2 CHD risk factors. Marland Kitchen LDL-C is now calculated using the Martin-Hopkins  calculation, which is a validated novel method providing  better accuracy than the Friedewald equation in the  estimation of LDL-C.  Cresenciano Genre et al. Annamaria Helling. 8676;195(09): 2061-2068  (http://education.[employer].com/faq/FAQ164)    Total CHOL/HDL Ratio 3.6 <5.0 (calc)   Non-HDL Cholesterol (Calc) 102 <130 mg/dL (calc)    Comment: For patients with diabetes plus 1 major ASCVD risk  factor, treating to a non-HDL-C goal of <100 mg/dL  (LDL-C of <70 mg/dL) is considered a therapeutic  option.   POCT INR     Status: Normal   Collection Time: 09/24/18 12:51 PM  Result Value Ref Range   INR 2.0 2.0 - 3.0  POCT INR     Status: Normal   Collection Time: 10/22/18 12:19 PM  Result Value Ref Range   INR 2.1 2.0 - 3.0    Objective: General: Patient is awake, alert, and oriented x 3 and in no acute distress.  Integument: Skin is warm, dry and supple bilateral. Nails are tender, long, thickened and  dystrophic with subungual debris, consistent with onychomycosis, 1-5 bilateral with peeling of right hallux nail. No signs of infection. No open lesions or preulcerative lesions present bilateral. Remaining integument unremarkable.  Vasculature:  Dorsalis Pedis pulse 1/4 bilateral. Posterior Tibial pulse  /4 bilateral.  Capillary fill time <3 sec 1-5 bilateral. Positive hair growth  to the level of the digits. Temperature gradient within normal limits. Mild varicosities present bilateral. Trace edema present bilateral.   Neurology: The patient has intact sensation measured with a 5.07/10g Semmes Weinstein Monofilament at all pedal sites bilateral. Vibratory sensation diminished bilateral with tuning fork. No Babinski sign present bilateral.   Musculoskeletal:  Pes planus and minimal  hammertoe pedal deformities noted bilateral. Mild tenderness to plantar fascia insertions bilateral. Muscular strength 5/5 in all lower extremity muscular groups bilateral without pain on range of motion . No tenderness with calf compression bilateral.  Assessment and Plan: Problem List Items Addressed This Visit    None    Visit Diagnoses    Pain due to onychomycosis of toenails of both feet    -  Primary   Plantar fasciitis       Neuropathy          -Examined patient. -Discussed and educated patient on diabetic foot care, especially with  regards to the vascular, neurological and musculoskeletal systems.  -Stressed the importance of good glycemic control and the detriment of not  controlling glucose levels in relation to the foot. -Mechanically debrided all nails 1-5 bilateral using sterile nail nipper and filed with dremel without incident  -May try vicks to toenails as needed  -Patient to see Liliane Channel for diabetic shoes -Continue with Gabapentin for neuropathy  -Rx stretching for fasciitis  -Answered all patient questions -Patient to return  in 3 months for at risk foot care -Patient advised to call the office if any problems or questions arise in the meantime.  Landis Martins, DPM

## 2018-10-28 NOTE — Patient Instructions (Signed)
Plantar Fasciitis Rehab Ask your health care provider which exercises are safe for you. Do exercises exactly as told by your health care provider and adjust them as directed. It is normal to feel mild stretching, pulling, tightness, or discomfort as you do these exercises. Stop right away if you feel sudden pain or your pain gets worse. Do not begin these exercises until told by your health care provider. Stretching and range-of-motion exercises These exercises warm up your muscles and joints and improve the movement and flexibility of your foot. These exercises also help to relieve pain. Plantar fascia stretch  1. Sit with your left / right leg crossed over your opposite knee. 2. Hold your heel with one hand with that thumb near your arch. With your other hand, hold your toes and gently pull them back toward the top of your foot. You should feel a stretch on the bottom of your toes or your foot (plantar fascia) or both. 3. Hold this stretch for__________ seconds. 4. Slowly release your toes and return to the starting position. Repeat __________ times. Complete this exercise __________ times a day. Gastrocnemius stretch, standing This exercise is also called a calf (gastroc) stretch. It stretches the muscles in the back of the upper calf. 1. Stand with your hands against a wall. 2. Extend your left / right leg behind you, and bend your front knee slightly. 3. Keeping your heels on the floor and your back knee straight, shift your weight toward the wall. Do not arch your back. You should feel a gentle stretch in your upper left / right calf. 4. Hold this position for __________ seconds. Repeat __________ times. Complete this exercise __________ times a day. Soleus stretch, standing This exercise is also called a calf (soleus) stretch. It stretches the muscles in the back of the lower calf. 1. Stand with your hands against a wall. 2. Extend your left / right leg behind you, and bend your front  knee slightly. 3. Keeping your heels on the floor, bend your back knee and shift your weight slightly over your back leg. You should feel a gentle stretch deep in your lower calf. 4. Hold this position for __________ seconds. Repeat __________ times. Complete this exercise __________ times a day. Gastroc and soleus stretch, standing step This exercise stretches the muscles in the back of the lower leg. These muscles are in the upper calf (gastrocnemius) and the lower calf (soleus). 1. Stand with the ball of your left / right foot on a step. The ball of your foot is on the walking surface, right under your toes. 2. Keep your other foot firmly on the same step. 3. Hold on to the wall or a railing for balance. 4. Slowly lift your other foot, allowing your body weight to press your left / right heel down over the edge of the step. You should feel a stretch in your left / right calf. 5. Hold this position for __________ seconds. 6. Return both feet to the step. 7. Repeat this exercise with a slight bend in your left / right knee. Repeat __________ times with your left / right knee straight and __________ times with your left / right knee bent. Complete this exercise __________ times a day. Balance exercise This exercise builds your balance and strength control of your arch to help take pressure off your plantar fascia. Single leg stand If this exercise is too easy, you can try it with your eyes closed or while standing on a pillow. 1.   Without shoes, stand near a railing or in a doorway. You may hold on to the railing or door frame as needed. 2. Stand on your left / right foot. Keep your big toe down on the floor and try to keep your arch lifted. Do not let your foot roll inward. 3. Hold this position for __________ seconds. Repeat __________ times. Complete this exercise __________ times a day. This information is not intended to replace advice given to you by your health care provider. Make sure  you discuss any questions you have with your health care provider. Document Released: 03/12/2005 Document Revised: 07/03/2018 Document Reviewed: 01/08/2018 [employer] Patient Education  2020 [employer]  

## 2018-10-29 DIAGNOSIS — H353213 Exudative age-related macular degeneration, right eye, with inactive scar: Secondary | ICD-10-CM | POA: Diagnosis not present

## 2018-10-29 DIAGNOSIS — H353221 Exudative age-related macular degeneration, left eye, with active choroidal neovascularization: Secondary | ICD-10-CM | POA: Diagnosis not present

## 2018-10-29 DIAGNOSIS — H43813 Vitreous degeneration, bilateral: Secondary | ICD-10-CM | POA: Diagnosis not present

## 2018-10-30 ENCOUNTER — Ambulatory Visit (INDEPENDENT_AMBULATORY_CARE_PROVIDER_SITE_OTHER): Payer: Medicare Other | Admitting: Internal Medicine

## 2018-10-30 ENCOUNTER — Other Ambulatory Visit: Payer: Self-pay

## 2018-10-30 ENCOUNTER — Encounter: Payer: Self-pay | Admitting: Internal Medicine

## 2018-10-30 VITALS — BP 148/80 | HR 59 | Temp 98.5°F | Ht 62.0 in | Wt 168.0 lb

## 2018-10-30 DIAGNOSIS — Z6830 Body mass index (BMI) 30.0-30.9, adult: Secondary | ICD-10-CM

## 2018-10-30 DIAGNOSIS — H547 Unspecified visual loss: Secondary | ICD-10-CM

## 2018-10-30 DIAGNOSIS — I6521 Occlusion and stenosis of right carotid artery: Secondary | ICD-10-CM

## 2018-10-30 DIAGNOSIS — I10 Essential (primary) hypertension: Secondary | ICD-10-CM | POA: Diagnosis not present

## 2018-10-30 DIAGNOSIS — H3530 Unspecified macular degeneration: Secondary | ICD-10-CM

## 2018-10-30 DIAGNOSIS — E1140 Type 2 diabetes mellitus with diabetic neuropathy, unspecified: Secondary | ICD-10-CM

## 2018-10-30 MED ORDER — AMLODIPINE BESYLATE 5 MG PO TABS: 5.0000 mg | ORAL_TABLET | Freq: Every day | ORAL | 0 refills | Status: DC

## 2018-10-30 MED ORDER — FREESTYLE LIBRE 14 DAY SENSOR MISC: 1.0000 "application " | Freq: Three times a day (TID) | 5 refills | Status: DC

## 2018-10-30 MED ORDER — FREESTYLE LIBRE 14 DAY READER DEVI: 1.0000 "application " | Freq: Three times a day (TID) | 5 refills | Status: DC

## 2018-10-30 NOTE — Progress Notes (Signed)
Location:  [facility]   Advanced Directives 08/13/2018  Does Patient Have a Medical Advance Directive? Yes  Type of Paramedic of [place];Living will  Does patient want to make changes to medical advance directive? No - Patient declined  Copy of [place] in Chart? Yes - validated most recent copy scanned in chart (See row information)  Would patient like information on creating a medical advance directive? -     Chief Complaint  Patient presents with  . Medical Management of Chronic Issues    6 week follow-up    HPI: Patient is a 78 y.o. female seen today for medical management of chronic diseases.    She is accompanied by her daughter today.   She continues to have issues with her vision. Recently was seen by her ophthalmology specialist and no new interventions started. She will follow up in a few months. A home health safety visit has been ordered. Her home has no loose rugs or free-laying cords. Her walkways within her home are clear. Her daughter and her have begun mapping out her home and permanently placing items in specific places to help her locate her belongings.  On 10/16/2018 she fell at home with no injury. She could not see the second step into her tome and fell.   Her declining vision has made it hard for her to use her glucometer. She cannot see well enough to put the strip into the glucometer. She also has trouble taking her blood pressure because she cannot visualize the numbers.   Her diet consists of artifical meat products and frozen foods. She is not always following a diet low in sodium and carbs. She has brought a food journal with her today.   She is not exercising like discussed last visit. She has no motivation to use her elipitical and is scared of falls. Daughter is requesting referral to weight loss clinic.  She was recently seen by podiatry. Her toenails were trimmed and diabetic shoes were ordered.       Past Medical History:  Diagnosis Date  . Adjustment disorder 10/17/2005  . Atrial fibrillation ([place])   . Cataract    bilateral  . CHF (congestive heart failure) ([place])   . Contracture of knee joint 07/26/2009  . Coronary artery disease   . Degeneration of lumbar or lumbosacral intervertebral disc 07/26/2009  . Diabetes mellitus without complication ([place])   . Esophageal spasm   . Esophageal spasm   . Fibromyalgia   . Fibromyalgia   . Hearing loss 12/18/2010  . Heart murmur   . Hypercholesteremia   . Hypertension   . 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 Leaky heart valve   . Lyme disease   . Macular degeneration   . Macular degeneration of both eyes   . Migraine 10/17/2005  . Mixed incontinence 10/17/2005  . Osteoarthritis    multiple joints   . Overweight 09/19/2010  . Pneumonia   . PONV (postoperative nausea and vomiting)   . Postartificial menopausal syndrome 10/10/2009  . Prinzmetal angina ([place]) 10/17/2005  . Psoriasis 06/12/2006  . Sciatica   . TIA (transient ischemic attack)   . Type II diabetes mellitus ([place])   . [name] 10/17/2005    Past Surgical History:  Procedure Laterality Date  . ABDOMINAL HYSTERECTOMY    . AORTIC VALVE REPLACEMENT  12/10/2013   porcine  . CATARACT EXTRACTION, BILATERAL    . CHOLECYSTECTOMY  1981  .  COLONOSCOPY  04/18/2006   Dr. Nelva Nay: internal hemorrhoids  . COLONOSCOPY  10/2003   Dr. Alphonsa Gin: hemorrhoids  . CORONARY ARTERY BYPASS GRAFT  2015  . ESOPHAGOGASTRODUODENOSCOPY  10/16/2013   Dr. Marla Roe: prior Nissen fundoplication intact, hypertonic LES, dilated up to 9 [place] with moderate resistance, gastritis but no H. pylori., Reactive gastritis, no celiac disease.  . ESOPHAGOGASTRODUODENOSCOPY  05/29/2012   Dr. Doy Mince: Moderately severe esophagitis, acute gastritis reactive, no H pylori, no celiac. Esophageal biopsies consistent with GERD, no Barrett  . ESOPHAGOGASTRODUODENOSCOPY  03/21/2009    Dr. Doy Mince: Reflux esophagitis, gastritis without H. pylori, esophagus stretched 57 savory  . ESOPHAGOGASTRODUODENOSCOPY  02/20/2008   Dr. Doy Mince: Esophagus dilated to 22 French, reactive gastropathy with no H pylori. No Barrett's on esophageal biopsy  . ESOPHAGOGASTRODUODENOSCOPY  09/24/2006   Dr. Doy Mince: Tight wrap noted, reactive gastropathy, no Barrett's  . ESOPHAGOGASTRODUODENOSCOPY (EGD) WITH PROPOFOL N/A 01/09/2017   Procedure: ESOPHAGOGASTRODUODENOSCOPY (EGD) WITH PROPOFOL;  Surgeon: Daneil Dolin, MD;  [facility];  Service: Endoscopy;  Laterality: N/A;  2:15PM  . FRACTURE SURGERY Left    wrist  . JOINT REPLACEMENT    . MALONEY DILATION N/A 01/09/2017   Procedure: Venia Minks DILATION;  Surgeon: Daneil Dolin, MD;  Location: [facility];  Service: Endoscopy;  Laterality: N/A;  . MITRAL VALVE REPAIR  11/2013  . NISSEN FUNDOPLICATION  4503  . REPLACEMENT TOTAL KNEE BILATERAL  2010/2013    Allergies  Allergen Reactions  . Penicillins Anaphylaxis and Other (See Comments)    Has patient had a PCN reaction causing immediate rash, facial/tongue/throat swelling, SOB or lightheadedness with hypotension: Yes Has patient had a PCN reaction causing severe rash involving mucus membranes or skin necrosis: Yes Has patient had a PCN reaction that required hospitalization Yes Has patient had a PCN reaction occurring within the last 10 years: No If all of the above answers are "NO", then may proceed with Cephalosporin use.   . Latex Other (See Comments)    Redness and rash  . Morphine And Related Nausea Only and Other (See Comments)    Dizziness  . Tape Other (See Comments)    Redness and rash  . Iodine Other (See Comments)    Redness and rash    Outpatient Encounter Medications as of 10/30/2018  Medication Sig  . acetaminophen (TYLENOL) 650 MG CR tablet Take 650-1,300 mg by mouth every 8 (eight) hours as needed for pain.   . Aflibercept (EYLEA) 2 MG/0.05ML SOLN by  Intravitreal route.  Marland Kitchen aspirin EC 81 MG tablet Take 81 mg by mouth every morning.   Marland Kitchen atorvastatin (LIPITOR) 80 MG tablet Take 1 tablet (80 mg total) by mouth daily.  . calcium citrate-vitamin D (CITRACAL+D) 315-200 MG-UNIT tablet Take 1 tablet by mouth 2 (two) times daily.  . cetirizine (ZYRTEC) 10 MG chewable tablet Chew 10 mg by mouth daily.  . fluticasone (FLONASE) 50 MCG/ACT nasal spray USE 2 SPRAYS IN EACH NOSTRIL EVERY DAY  . furosemide (LASIX) 20 MG tablet Take 1 tablet (20 mg total) by mouth daily.  Marland Kitchen gabapentin (NEURONTIN) 100 MG capsule Take 100 mg by mouth See admin instructions. Taking 2 tabs in morning and 3 tabs at night  . lisinopril (ZESTRIL) 20 MG tablet Take 1 tablet (20 mg total) by mouth daily.  Marland Kitchen loperamide (IMODIUM A-D) 2 MG tablet Take 2 mg by mouth as needed for diarrhea or loose stools.  . metFORMIN (GLUCOPHAGE) 1000 MG tablet Take 1 tablet (1,000  mg total) by mouth 2 (two) times daily with a meal.  . metoprolol tartrate (LOPRESSOR) 25 MG tablet Take 0.5 tablets (12.5 mg total) by mouth 2 (two) times daily.  . potassium chloride (K-DUR,KLOR-CON) 10 MEQ tablet Take 1 tablet (10 mEq total) by mouth daily.  . Probiotic Product (FORTIFY DAILY PROBIOTIC PO) Take by mouth.  . tobramycin (TOBREX) 0.3 % ophthalmic solution   . Vitamin D, Ergocalciferol, (DRISDOL) 50000 units CAPS capsule Take 1 capsule (50,000 Units total) by mouth every 7 (seven) days.  Marland Kitchen warfarin (COUMADIN) 6 MG tablet TAKE 1 TABLET EVERY DAY   No facility-administered encounter medications on file as of 10/30/2018.     Review of Systems:  Review of Systems  Constitutional: Negative for activity change, appetite change and fatigue.  HENT: Negative for dental problem, hearing loss and trouble swallowing.   Eyes: Positive for photophobia and visual disturbance.       Unable to see glucometer and food labeling  Respiratory: Positive for shortness of breath. Negative for cough and wheezing.   Cardiovascular:  Negative for chest pain, palpitations and leg swelling.  Gastrointestinal: Positive for constipation and diarrhea. Negative for nausea.  Endocrine: Negative for polydipsia, polyphagia and polyuria.  Genitourinary: Negative for dysuria, hematuria and vaginal bleeding.  Musculoskeletal: Positive for arthralgias and back pain.  Neurological: Positive for numbness. Negative for headaches.       Neuropathy in feet  Psychiatric/Behavioral: Negative for dysphoric mood, sleep disturbance and suicidal ideas. The patient is not nervous/anxious.     Health Maintenance  Topic Date Due  . FOOT EXAM  09/06/2018  . INFLUENZA VACCINE  10/25/2018  . HEMOGLOBIN A1C  03/06/2019  . OPHTHALMOLOGY EXAM  05/14/2019  . COLONOSCOPY  03/26/2022  . TETANUS/TDAP  10/13/2026  . DEXA SCAN  Completed  . PNA vac Low Risk Adult  Completed    Physical Exam: Vitals:   10/30/18 0928  BP: (!) 148/80  Pulse: (!) 59  Temp: 98.5 F (36.9 C)  TempSrc: Oral  SpO2: 96%  Weight: 168 lb (76.2 kg)  Height: 5\' 2"  (1.575 m)   Body mass index is 30.73 kg/m. Physical Exam Vitals signs reviewed.  Constitutional:      General: She is not in acute distress.    Appearance: Normal appearance. She is normal weight. She is not ill-appearing.  HENT:     Head: Normocephalic.     Mouth/Throat:     Mouth: Mucous membranes are dry.  Cardiovascular:     Rate and Rhythm: Normal rate and regular rhythm.     Heart sounds: Murmur present.  Pulmonary:     Effort: Pulmonary effort is normal. No respiratory distress.     Breath sounds: Normal breath sounds. No wheezing.  Abdominal:     General: Bowel sounds are normal.     Palpations: Abdomen is soft.  Musculoskeletal:     Right lower leg: No edema.     Left lower leg: No edema.  Skin:    General: Skin is warm and dry.     Capillary Refill: Capillary refill takes less than 2 seconds.  Neurological:     Mental Status: She is alert and oriented to person, place, and time.      Sensory: No sensory deficit.     Motor: No weakness.     Coordination: Coordination normal.  Psychiatric:        Mood and Affect: Mood normal.        Behavior: Behavior normal.  Labs reviewed: Basic Metabolic Panel: Recent Labs    01/06/18 0820 04/24/18 1615 09/04/18 0939  NA 140 139 140  K 4.4 4.5 4.8  CL 106 101 103  CO2 28 28 28   GLUCOSE 124* 130 118*  BUN 19 25 18   CREATININE 0.79 0.96* 0.90  CALCIUM 9.5 9.9 9.4   Liver Function Tests: Recent Labs    01/06/18 0820 04/24/18 1615 09/04/18 0939  AST 21 21 19   ALT 17 17 14   ALKPHOS 79  --   --   BILITOT 0.7 0.4 0.6  PROT 7.5 7.3 6.9  ALBUMIN 3.9  --   --    No results for input(s): LIPASE, AMYLASE in the last 8760 hours. No results for input(s): AMMONIA in the last 8760 hours. CBC: Recent Labs    01/06/18 0820 04/24/18 1615 09/04/18 0939  WBC 9.9 11.2* 9.7  NEUTROABS 6.5 9,475* 6,936  HGB 12.3 13.4 13.1  HCT 39.2 40.2 40.0  MCV 88.7 84.6 86.2  PLT 395 393 412*   Lipid Panel: Recent Labs    01/06/18 0820 09/04/18 0939  CHOL 131 142  HDL 35* 40*  LDLCALC 61 74  TRIG 173* 183*  CHOLHDL 3.7 3.6   Lab Results  Component Value Date   HGBA1C 7.7 (H) 09/04/2018    Procedures since last visit: No results found.  Assessment/Plan: 1. Uncontrolled hypertension - ongoing, still not at goal of <150/90 -will add amlodipine 5 mg PO daily for 30 days , if no lower leg swelling- will continue - her diet consists of mainly frozen foods and "meat substitutes" - continue to follow a low sodium diet - continue to record blood pressures - COMPLETE METABOLIC PANEL WITH GFR - CBC with differential/Platelets- future - lipid panel- future  2. Type 2 diabetes mellitus with chronic painful diabetic neuropathy (HCC) - daily blood sugars are below 150 - due to macular degeneration, patient having trouble using glucometer -Order Freestyle Libre glucometer - continue to follow a diet low in carbs and sugars -  hemoglobin a1c- future - continue to take blood glucose 4 times a day and record  3. Body mass index (BMI) of 30.0-30.9 - ongoing, she still struggles to use her elipitical and scared to take walks due to her vision - refer to healthy weight and wellness clinic  4. Macular degeneration of both eyes, unspecified type - ongoing,followed by Dr. Baird Cancer - need to obtain last note from his office  5. Visual impairment - encourage home health safety inspection - create a falls a mangement plan - discuss life alert    Labs/tests ordered:  CBC with differential/ Platelets, CMP with GFR, hemoglobin a1c, lipid panel Next appt:  01/14/2019

## 2018-10-30 NOTE — Addendum Note (Signed)
Addended by: Gayland Curry on: 10/30/2018 05:18 PM   Modules accepted: Orders, Level of Service

## 2018-10-30 NOTE — Patient Instructions (Addendum)
Be careful about your sodium intake in frozen foods.  This is likely contributing to your high blood pressure.    I encourage you to get your exercise program going.    Fall Prevention in the Home, Adult Falls can cause injuries and can affect people from all age groups. There are many simple things that you can do to make your home safe and to help prevent falls. Ask for help when making these changes, if needed. What actions can I take to prevent falls? General instructions  Use good lighting in all rooms. Replace any light bulbs that burn out.  Turn on lights if it is dark. Use night-lights.  Place frequently used items in easy-to-reach places. Lower the shelves around your home if necessary.  Set up furniture so that there are clear paths around it. Avoid moving your furniture around.  Remove throw rugs and other tripping hazards from the floor.  Avoid walking on wet floors.  Fix any uneven floor surfaces.  Add color or contrast paint or tape to grab bars and handrails in your home. Place contrasting color strips on the first and last steps of stairways.  When you use a stepladder, make sure that it is completely opened and that the sides are firmly locked. Have someone hold the ladder while you are using it. Do not climb a closed stepladder.  Be aware of any and all pets. What can I do in the bathroom?      Keep the floor dry. Immediately clean up any water that spills onto the floor.  Remove soap buildup in the tub or shower on a regular basis.  Use non-skid mats or decals on the floor of the tub or shower.  Attach bath mats securely with double-sided, non-slip rug tape.  If you need to sit down while you are in the shower, use a plastic, non-slip stool.  Install grab bars by the toilet and in the tub and shower. Do not use towel bars as grab bars. What can I do in the bedroom?  Make sure that a bedside light is easy to reach.  Do not use oversized bedding that  drapes onto the floor.  Have a firm chair that has side arms to use for getting dressed. What can I do in the kitchen?  Clean up any spills right away.  If you need to reach for something above you, use a sturdy step stool that has a grab bar.  Keep electrical cables out of the way.  Do not use floor polish or wax that makes floors slippery. If you must use wax, make sure that it is non-skid floor wax. What can I do in the stairways?  Do not leave any items on the stairs.  Make sure that you have a light switch at the top of the stairs and the bottom of the stairs. Have them installed if you do not have them.  Make sure that there are handrails on both sides of the stairs. Fix handrails that are broken or loose. Make sure that handrails are as long as the stairways.  Install non-slip stair treads on all stairs in your home.  Avoid having throw rugs at the top or bottom of stairways, or secure the rugs with carpet tape to prevent them from moving.  Choose a carpet design that does not hide the edge of steps on the stairway.  Check any carpeting to make sure that it is firmly attached to the stairs. Fix any carpet  that is loose or worn. What can I do on the outside of my home?  Use bright outdoor lighting.  Regularly repair the edges of walkways and driveways and fix any cracks.  Remove high doorway thresholds.  Trim any shrubbery on the main path into your home.  Regularly check that handrails are securely fastened and in good repair. Both sides of any steps should have handrails.  Install guardrails along the edges of any raised decks or porches.  Clear walkways of debris and clutter, including tools and rocks.  Have leaves, snow, and ice cleared regularly.  Use sand or salt on walkways during winter months.  In the garage, clean up any spills right away, including grease or oil spills. What other actions can I take?  Wear closed-toe shoes that fit well and support  your feet. Wear shoes that have rubber soles or low heels.  Use mobility aids as needed, such as canes, walkers, scooters, and crutches.  Review your medicines with your health care provider. Some medicines can cause dizziness or changes in blood pressure, which increase your risk of falling. Talk with your health care provider about other ways that you can decrease your risk of falls. This may include working with a physical therapist or trainer to improve your strength, balance, and endurance. Where to find more information  Centers for Disease Control and Prevention, [place]: WebmailGuide.co.za  [employer] on Aging: BrainJudge.co.uk Contact a health care provider if:  You are afraid of falling at home.  You feel weak, drowsy, or dizzy at home.  You fall at home. Summary  There are many simple things that you can do to make your home safe and to help prevent falls.  Ways to make your home safe include removing tripping hazards and installing grab bars in the bathroom.  Ask for help when making these changes in your home. This information is not intended to replace advice given to you by your health care provider. Make sure you discuss any questions you have with your health care provider. Document Released: 03/02/2002 Document Revised: 02/22/2017 Document Reviewed: 10/25/2016 Elsevier Patient Education  2020 [employer].

## 2018-11-11 ENCOUNTER — Encounter: Payer: Self-pay | Admitting: Family

## 2018-11-11 ENCOUNTER — Ambulatory Visit (INDEPENDENT_AMBULATORY_CARE_PROVIDER_SITE_OTHER): Payer: Medicare Other | Admitting: Family

## 2018-11-11 ENCOUNTER — Other Ambulatory Visit: Payer: Self-pay

## 2018-11-11 DIAGNOSIS — L237 Allergic contact dermatitis due to plants, except food: Secondary | ICD-10-CM | POA: Diagnosis not present

## 2018-11-11 MED ORDER — PREDNISONE 10 MG PO TABS: ORAL_TABLET | ORAL | 0 refills | Status: DC

## 2018-11-11 NOTE — Progress Notes (Signed)
This service is provided via telemedicine  No vital signs collected/recorded due to the encounter was a telemedicine visit.   Location of patient (ex: home, work):  Home   Patient consents to a telephone visit:  Yes  Location of the provider (ex: office, home):  Office   Name of any referring provider:  Dr. Hollace Kinnier, DO  Names of all persons participating in the telemedicine service and their role in the encounter:  Tenzin Pavon NP, Ruthell Rummage CMA, Duanne Guess and daughter [name]   Time spent on call: Ruthell Rummage CMA, Spent 11  Minutes on phone with patient     [facility] clinic  Provider: Marlowe Sax, NP   Code Status: FULL  Goals of Care:  Advanced Directives 08/13/2018  Does Patient Have a Medical Advance Directive? Yes  Type of Paramedic of [place];Living will  Does patient want to make changes to medical advance directive? No - Patient declined  Copy of [place] in Chart? Yes - validated most recent copy scanned in chart (See row information)  Would patient like information on creating a medical advance directive? -     Chief Complaint  Patient presents with  . Acute Visit    Complains of Poison Ivy on hands, neck, and face     HPI: Patient is a 78 y.o. female seen today for an acute visit for evaluation of contact with Poison Ivy.she states was outside pulling weeds on Saturday and Sunday.Daughter states patient broke out with rash yesterday.she reports rash on (between fingers) hands, neck, and on the eyelids and around the eyes.she also has itching and burning.she has applied calamine lotion but cannot apply around the eyes.itching keeps her awake at night.she takes zyrtec daily for chronic allergies.she denies any fever,chills or changes in her vision.  Past Medical History:  Diagnosis Date  . Adjustment disorder 10/17/2005  . Atrial fibrillation ([place])   . Cataract    bilateral  . CHF (congestive heart  failure) ([place])   . Contracture of knee joint 07/26/2009  . Coronary artery disease   . Degeneration of lumbar or lumbosacral intervertebral disc 07/26/2009  . Diabetes mellitus without complication ([place])   . Esophageal spasm   . Esophageal spasm   . Fibromyalgia   . Fibromyalgia   . Hearing loss 12/18/2010  . Heart murmur   . Hypercholesteremia   . Hypertension   . 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 Leaky heart valve   . Lyme disease   . Macular degeneration   . Macular degeneration of both eyes   . Migraine 10/17/2005  . Mixed incontinence 10/17/2005  . Osteoarthritis    multiple joints   . Overweight 09/19/2010  . Pneumonia   . PONV (postoperative nausea and vomiting)   . Postartificial menopausal syndrome 10/10/2009  . Prinzmetal angina ([place]) 10/17/2005  . Psoriasis 06/12/2006  . Sciatica   . TIA (transient ischemic attack)   . Type II diabetes mellitus ([place])   . [name] 10/17/2005    Past Surgical History:  Procedure Laterality Date  . ABDOMINAL HYSTERECTOMY    . AORTIC VALVE REPLACEMENT  12/10/2013   porcine  . CATARACT EXTRACTION, BILATERAL    . CHOLECYSTECTOMY  1981  . COLONOSCOPY  04/18/2006   Dr. Nelva Nay: internal hemorrhoids  . COLONOSCOPY  10/2003   Dr. Alphonsa Gin: hemorrhoids  . CORONARY ARTERY BYPASS GRAFT  2015  . ESOPHAGOGASTRODUODENOSCOPY  10/16/2013   Dr. Marla Roe: prior Florence Canner  fundoplication intact, hypertonic LES, dilated up to 45 [place] with moderate resistance, gastritis but no H. pylori., Reactive gastritis, no celiac disease.  . ESOPHAGOGASTRODUODENOSCOPY  05/29/2012   Dr. Doy Mince: Moderately severe esophagitis, acute gastritis reactive, no H pylori, no celiac. Esophageal biopsies consistent with GERD, no Barrett  . ESOPHAGOGASTRODUODENOSCOPY  03/21/2009   Dr. Doy Mince: Reflux esophagitis, gastritis without H. pylori, esophagus stretched 57 savory  . ESOPHAGOGASTRODUODENOSCOPY  02/20/2008   Dr. Doy Mince:  Esophagus dilated to 44 [place], reactive gastropathy with no H pylori. No Barrett's on esophageal biopsy  . ESOPHAGOGASTRODUODENOSCOPY  09/24/2006   Dr. Doy Mince: Tight wrap noted, reactive gastropathy, no Barrett's  . ESOPHAGOGASTRODUODENOSCOPY (EGD) WITH PROPOFOL N/A 01/09/2017   Procedure: ESOPHAGOGASTRODUODENOSCOPY (EGD) WITH PROPOFOL;  Surgeon: Daneil Dolin, MD;  [facility];  Service: Endoscopy;  Laterality: N/A;  2:15PM  . FRACTURE SURGERY Left    wrist  . JOINT REPLACEMENT    . MALONEY DILATION N/A 01/09/2017   Procedure: Venia Minks DILATION;  Surgeon: Daneil Dolin, MD;  Location: [facility];  Service: Endoscopy;  Laterality: N/A;  . MITRAL VALVE REPAIR  11/2013  . NISSEN FUNDOPLICATION  8527  . REPLACEMENT TOTAL KNEE BILATERAL  2010/2013    Allergies  Allergen Reactions  . Penicillins Anaphylaxis and Other (See Comments)    Has patient had a PCN reaction causing immediate rash, facial/tongue/throat swelling, SOB or lightheadedness with hypotension: Yes Has patient had a PCN reaction causing severe rash involving mucus membranes or skin necrosis: Yes Has patient had a PCN reaction that required hospitalization Yes Has patient had a PCN reaction occurring within the last 10 years: No If all of the above answers are "NO", then may proceed with Cephalosporin use.   . Latex Other (See Comments)    Redness and rash  . Morphine And Related Nausea Only and Other (See Comments)    Dizziness  . Tape Other (See Comments)    Redness and rash  . Iodine Other (See Comments)    Redness and rash    Outpatient Encounter Medications as of 11/11/2018  Medication Sig  . acetaminophen (TYLENOL) 650 MG CR tablet Take 650-1,300 mg by mouth every 8 (eight) hours as needed for pain.   Marland Kitchen amLODipine (NORVASC) 5 MG tablet Take 1 tablet (5 mg total) by mouth daily.  Marland Kitchen aspirin EC 81 MG tablet Take 81 mg by mouth every morning.   Marland Kitchen atorvastatin (LIPITOR) 80 MG tablet Take 1 tablet  (80 mg total) by mouth daily.  . calcium citrate-vitamin D (CITRACAL+D) 315-200 MG-UNIT tablet Take 1 tablet by mouth 2 (two) times daily.  . cetirizine (ZYRTEC) 10 MG chewable tablet Chew 10 mg by mouth daily.  . Continuous Blood Gluc Receiver (FREESTYLE LIBRE 14 DAY READER) DEVI 1 application by Does not apply route 3 (three) times daily.  . Continuous Blood Gluc Sensor (FREESTYLE LIBRE 14 DAY SENSOR) MISC 1 application by Does not apply route 3 (three) times daily.  . fluticasone (FLONASE) 50 MCG/ACT nasal spray USE 2 SPRAYS IN EACH NOSTRIL EVERY DAY  . furosemide (LASIX) 20 MG tablet Take 1 tablet (20 mg total) by mouth daily.  Marland Kitchen gabapentin (NEURONTIN) 100 MG capsule Take 100 mg by mouth See admin instructions. Taking 2 tabs in morning and 3 tabs at night  . lisinopril (ZESTRIL) 20 MG tablet Take 1 tablet (20 mg total) by mouth daily.  Marland Kitchen loperamide (IMODIUM A-D) 2 MG tablet Take 2 mg by mouth as needed for diarrhea or loose  stools.  . metFORMIN (GLUCOPHAGE) 1000 MG tablet Take 1 tablet (1,000 mg total) by mouth 2 (two) times daily with a meal.  . metoprolol tartrate (LOPRESSOR) 25 MG tablet Take 0.5 tablets (12.5 mg total) by mouth 2 (two) times daily.  . potassium chloride (K-DUR,KLOR-CON) 10 MEQ tablet Take 1 tablet (10 mEq total) by mouth daily.  . Probiotic Product (FORTIFY DAILY PROBIOTIC PO) Take by mouth.  . tobramycin (TOBREX) 0.3 % ophthalmic solution   . Vitamin D, Ergocalciferol, (DRISDOL) 50000 units CAPS capsule Take 1 capsule (50,000 Units total) by mouth every 7 (seven) days.  Marland Kitchen warfarin (COUMADIN) 6 MG tablet TAKE 1 TABLET EVERY DAY   No facility-administered encounter medications on file as of 11/11/2018.     Review of Systems:  Review of Systems  Unable to perform ROS: Other (additional information provided by patient's daughter )  Constitutional: Negative for appetite change, chills, fatigue and fever.  HENT: Negative for congestion, rhinorrhea, sinus pressure, sinus  pain, sneezing and sore throat.        Chronic allergies   Eyes: Positive for itching. Negative for discharge and visual disturbance.       Eyelid,and around eye is red   Respiratory: Negative for cough, chest tightness, shortness of breath and wheezing.   Cardiovascular: Negative for chest pain, palpitations and leg swelling.  Gastrointestinal: Negative for abdominal distention, abdominal pain, constipation, diarrhea, nausea and vomiting.  Endocrine: Negative for polydipsia, polyphagia and polyuria.  Skin: Positive for rash. Negative for color change and pallor.  Neurological: Negative for dizziness, light-headedness and headaches.  Psychiatric/Behavioral: Positive for sleep disturbance.    Health Maintenance  Topic Date Due  . FOOT EXAM  09/06/2018  . INFLUENZA VACCINE  10/25/2018  . HEMOGLOBIN A1C  03/06/2019  . OPHTHALMOLOGY EXAM  05/14/2019  . TETANUS/TDAP  10/13/2026  . DEXA SCAN  Completed  . PNA vac Low Risk Adult  Completed    Physical Exam: There were no vitals filed for this visit. There is no height or weight on file to calculate BMI. Physical Exam  unable to complete on Telephone visit.   Labs reviewed: Basic Metabolic Panel: Recent Labs    01/06/18 0820 04/24/18 1615 09/04/18 0939  NA 140 139 140  K 4.4 4.5 4.8  CL 106 101 103  CO2 28 28 28   GLUCOSE 124* 130 118*  BUN 19 25 18   CREATININE 0.79 0.96* 0.90  CALCIUM 9.5 9.9 9.4   Liver Function Tests: Recent Labs    01/06/18 0820 04/24/18 1615 09/04/18 0939  AST 21 21 19   ALT 17 17 14   ALKPHOS 79  --   --   BILITOT 0.7 0.4 0.6  PROT 7.5 7.3 6.9  ALBUMIN 3.9  --   --    CBC: Recent Labs    01/06/18 0820 04/24/18 1615 09/04/18 0939  WBC 9.9 11.2* 9.7  NEUTROABS 6.5 9,475* 6,936  HGB 12.3 13.4 13.1  HCT 39.2 40.2 40.0  MCV 88.7 84.6 86.2  PLT 395 393 412*   Lipid Panel: Recent Labs    01/06/18 0820 09/04/18 0939  CHOL 131 142  HDL 35* 40*  LDLCALC 61 74  TRIG 173* 183*  CHOLHDL  3.7 3.6   Lab Results  Component Value Date   HGBA1C 7.7 (H) 09/04/2018    Procedures since last visit:  Assessment/Plan   Poison ivy dermatitis Afebrile.Rash reported around the eyes,both eyelids,neck and between the fingers to both hands.No shortness of breath or changes in vision  reported.  -Apply cold compressors to affected areas at least 5-10 minutes - continue on calamine lotion as needed for itching  - continue on zyrtec 10 mg tablet daily for itching. - start on Prednisone 20 mg tablet daily  x 1 dose then take 10 mg tablet daily x 1 dose then 1/2 tablet (5 mg) daily  x 1 dose then stop. - Notify provider if symptoms not resolve or worsen or running any fever > 100.5    Labs/tests ordered: None  Next appt:  12/04/2018 with Dr.Reed 4 weeks follow up for blood pressure.

## 2018-11-11 NOTE — Patient Instructions (Signed)
-Apply cold compressors to affected areas at least 5-10 minutes - continue on calamine lotion as needed for itching  - continue on zyrtec 10 mg tablet daily for itching. - start on Prednisone 20 mg tablet daily  x 1 dose then take 10 mg tablet daily x 1 dose then 1/2 tablet (5 mg) daily  x 1 dose then stop. - Notify provider if symptoms not resolve or worsen or running any fever > 100.5    Poison Ivy Dermatitis Poison ivy dermatitis is redness and soreness of the skin caused by chemicals in the leaves of the poison ivy plant. You may have very bad itching, swelling, a rash, and blisters. What are the causes?  Touching a poison ivy plant.  Touching something that has the chemical on it. This may include animals or objects that have come in contact with the plant. What increases the risk?  Going outdoors often in wooded or [place] areas.  Going outdoors without wearing protective clothing, such as closed shoes, long pants, and a long-sleeved shirt. What are the signs or symptoms?   Skin redness.  Very bad itching.  A rash that often includes bumps and blisters. ? The rash usually appears 48 hours after exposure, if you have been exposed before. ? If this is the first time you have been exposed, the rash may not appear until a week after exposure.  Swelling. This may occur if the reaction is very bad. Symptoms usually last for 1-2 weeks. The first time you develop this condition, symptoms may last 3-4 weeks. How is this treated? This condition may be treated with:  Hydrocortisone cream or calamine lotion to relieve itching.  Oatmeal baths to soothe the skin.  Medicines, such as over-the-counter antihistamine tablets.  Oral steroid medicine for more severe reactions. Follow these instructions at home: Medicines  Take or apply over-the-counter and prescription medicines only as told by your doctor.  Use hydrocortisone cream or calamine lotion as needed to help with itching.  General instructions  Do not scratch or rub your skin.  Put a cold, wet cloth (cold compress) on the affected areas or take baths in cool water. This will help with itching.  Avoid hot baths and showers.  Take oatmeal baths as needed. Use colloidal oatmeal. You can get this at a pharmacy or grocery store. Follow the instructions on the package.  While you have the rash, wash your clothes right after you wear them.  Keep all follow-up visits as told by your health care provider. This is important. How is this prevented?   Know what poison ivy looks like, so you can avoid it. ? This plant has three leaves with flowering branches on a single stem. ? The leaves are glossy. ? The leaves have uneven edges that come to a point at the front.  If you touch poison ivy, wash your skin with soap and water right away. Be sure to wash under your fingernails.  When hiking or camping, wear long pants, a long-sleeved shirt, tall socks, and hiking boots. You can also use a lotion on your skin that helps to prevent contact with poison ivy.  If you think that your clothes or outdoor gear came in contact with poison ivy, rinse them off with a garden hose before you bring them inside your house.  When doing yard work or gardening, wear gloves, long sleeves, long pants, and boots. Wash your garden tools and gloves if they come in contact with poison ivy.  If  you think that your pet has come into contact with poison ivy, wash him or her with pet shampoo and water. Make sure to wear gloves while washing your pet. Contact a doctor if:  You have open sores in the rash area.  You have more redness, swelling, or pain in the rash area.  You have redness that spreads beyond the rash area.  You have fluid, blood, or pus coming from the rash area.  You have a fever.  You have a rash over a large area of your body.  You have a rash on your eyes, mouth, or genitals.  Your rash does not get better after a  few weeks. Get help right away if:  Your face swells or your eyes swell shut.  You have trouble breathing.  You have trouble swallowing. These symptoms may be an emergency. Do not wait to see if the symptoms will go away. Get medical help right away. Call your local emergency services (911 in the [place] Do not drive yourself to the hospital. Summary  Poison ivy dermatitis is redness and soreness of the skin caused by chemicals in the leaves of the poison ivy plant.  You may have skin redness, very bad itching, swelling, and a rash.  Do not scratch or rub your skin.  Take or apply over-the-counter and prescription medicines only as told by your doctor. This information is not intended to replace advice given to you by your health care provider. Make sure you discuss any questions you have with your health care provider. Document Released: 04/14/2010 Document Revised: 07/04/2018 Document Reviewed: 03/07/2018 Elsevier Patient Education  2020 [employer].

## 2018-11-13 ENCOUNTER — Telehealth: Payer: Self-pay | Admitting: Cardiology

## 2018-11-13 NOTE — Telephone Encounter (Addendum)
Returned call and spoke with the pt's daughter and she stated she sent a message yesterday to [facility] primary Nurse, explained that the Primary Nurse is on vacation this week and that I would be glad to assist. She states and the pt-her mom was put on Prednisone for poison ivy on her face. She could not recall the dose but after getting information from her I saw it in the chart and was able to confirm the dosing.  Prednisone 10mg  taking 20mg  x 1 day on 11/12/2018, 10mg  x1 day on 11/13/2018, then 5mg  x 2 days 11/14/2018 and 11/15/2018. She is aware the med will interact and that since it is not a very large dose it may not go to high but will adjust the of Warfarin today. Last INR was 2.1 on 10/22/2018, pt's next appt in 1 week and will leave.  Instructed her to have the pt take 1/2 tablet tomorrow (already taken today's dose) then continue taking normal dose and to have a serving on a leafy veggie if she would like and she verbalized understanding. Advised to call back if she needs anything else or if the dose changes.

## 2018-11-13 NOTE — Telephone Encounter (Signed)
Has question about new medication that patient has been put on

## 2018-11-21 ENCOUNTER — Other Ambulatory Visit: Payer: Self-pay | Admitting: Internal Medicine

## 2018-11-21 DIAGNOSIS — I10 Essential (primary) hypertension: Secondary | ICD-10-CM

## 2018-11-24 ENCOUNTER — Other Ambulatory Visit: Payer: Self-pay | Admitting: *Deleted

## 2018-11-24 MED ORDER — METOPROLOL TARTRATE 25 MG PO TABS: 12.5000 mg | ORAL_TABLET | Freq: Two times a day (BID) | ORAL | 1 refills | Status: DC

## 2018-11-24 MED ORDER — POTASSIUM CHLORIDE CRYS ER 10 MEQ PO TBCR: 10.0000 meq | EXTENDED_RELEASE_TABLET | Freq: Every day | ORAL | 1 refills | Status: DC

## 2018-11-24 NOTE — Telephone Encounter (Signed)
[facility] [place].  

## 2018-11-26 ENCOUNTER — Other Ambulatory Visit: Payer: Self-pay | Admitting: *Deleted

## 2018-11-26 DIAGNOSIS — I10 Essential (primary) hypertension: Secondary | ICD-10-CM

## 2018-11-26 MED ORDER — AMLODIPINE BESYLATE 5 MG PO TABS: 5.0000 mg | ORAL_TABLET | Freq: Every day | ORAL | 1 refills | Status: DC

## 2018-11-26 NOTE — Telephone Encounter (Signed)
[name], daughter called and stated that pharmacy did not received refill.   Refaxed to pharmacy.

## 2018-12-04 ENCOUNTER — Ambulatory Visit: Payer: Medicare Other | Admitting: Internal Medicine

## 2018-12-11 ENCOUNTER — Other Ambulatory Visit: Payer: Self-pay

## 2018-12-11 ENCOUNTER — Ambulatory Visit (INDEPENDENT_AMBULATORY_CARE_PROVIDER_SITE_OTHER): Payer: Medicare Other | Admitting: Internal Medicine

## 2018-12-11 ENCOUNTER — Encounter: Payer: Self-pay | Admitting: Internal Medicine

## 2018-12-11 VITALS — BP 128/68 | HR 59 | Temp 97.8°F | Ht 62.0 in | Wt 167.8 lb

## 2018-12-11 DIAGNOSIS — I10 Essential (primary) hypertension: Secondary | ICD-10-CM

## 2018-12-11 DIAGNOSIS — E1140 Type 2 diabetes mellitus with diabetic neuropathy, unspecified: Secondary | ICD-10-CM

## 2018-12-11 DIAGNOSIS — I6521 Occlusion and stenosis of right carotid artery: Secondary | ICD-10-CM

## 2018-12-11 DIAGNOSIS — Z23 Encounter for immunization: Secondary | ICD-10-CM

## 2018-12-11 MED ORDER — EMPAGLIFLOZIN 10 MG PO TABS: 10.0000 mg | ORAL_TABLET | Freq: Every day | ORAL | 0 refills | Status: DC

## 2018-12-11 NOTE — Progress Notes (Signed)
Location:  [facility] clinic  Provider:    Advanced Directives 12/11/2018  Does Patient Have a Medical Advance Directive? Yes  Type of Paramedic of [place];Living will  Does patient want to make changes to medical advance directive? No - Patient declined  Copy of [place] in Chart? Yes - validated most recent copy scanned in chart (See row information)  Would patient like information on creating a medical advance directive? -     Chief Complaint  Patient presents with  . Follow-up    4 week followup of blood pressure and CBGs. Daughter, [name], accompanies her today.   . Quality Metric Gaps    Diabetic foot exam   . Immunizations    Flu shot given today    HPI: Patient is a 78 y.o. female seen today for medical management of chronic diseases.    Daughter present at visit.  Her blood pressure has improved since last visit. She takes her lisinopril, lasix, metoprolol daily. Denies any chest pain, blurred vision, headaches, or dizziness. Takes her bp daily and keeps record. Limits sodium in her diet. Has not exercised since last visit. No weight loss reported.   Her sugars continue to be elevated. She checks her blood glucose 4 times a day using her Freestyle glucose reader. Her average glucose before dinner is under 200. Her average glucose after dinner is over 200. Admits to eating sweets daily and especially after dinner. Also trying to follow a diet that is plant-based, narrowing her food selection.   Interested in the [facility]  No falls or recent injuries     Past Medical History:  Diagnosis Date  . Adjustment disorder 10/17/2005  . Atrial fibrillation ([place])   . Cataract    bilateral  . CHF (congestive heart failure) ([place])   . Contracture of knee joint 07/26/2009  . Coronary artery disease   . Degeneration of lumbar or lumbosacral intervertebral disc 07/26/2009  . Diabetes mellitus without complication  ([place])   . Esophageal spasm   . Esophageal spasm   . Fibromyalgia   . Fibromyalgia   . Hearing loss 12/18/2010  . Heart murmur   . Hypercholesteremia   . Hypertension   . 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 Leaky heart valve   . Lyme disease   . Macular degeneration   . Macular degeneration of both eyes   . Migraine 10/17/2005  . Mixed incontinence 10/17/2005  . Osteoarthritis    multiple joints   . Overweight 09/19/2010  . Pneumonia   . PONV (postoperative nausea and vomiting)   . Postartificial menopausal syndrome 10/10/2009  . Prinzmetal angina ([place]) 10/17/2005  . Psoriasis 06/12/2006  . Sciatica   . TIA (transient ischemic attack)   . Type II diabetes mellitus ([place])   . [name] 10/17/2005    Past Surgical History:  Procedure Laterality Date  . ABDOMINAL HYSTERECTOMY    . AORTIC VALVE REPLACEMENT  12/10/2013   porcine  . CATARACT EXTRACTION, BILATERAL    . CHOLECYSTECTOMY  1981  . COLONOSCOPY  04/18/2006   Dr. Nelva Nay: internal hemorrhoids  . COLONOSCOPY  10/2003   Dr. Alphonsa Gin: hemorrhoids  . CORONARY ARTERY BYPASS GRAFT  2015  . ESOPHAGOGASTRODUODENOSCOPY  10/16/2013   Dr. Marla Roe: prior Nissen fundoplication intact, hypertonic LES, dilated up to 40 [place] with moderate resistance, gastritis but no H. pylori., Reactive gastritis, no celiac disease.  . ESOPHAGOGASTRODUODENOSCOPY  05/29/2012  Dr. Doy Mince: Moderately severe esophagitis, acute gastritis reactive, no H pylori, no celiac. Esophageal biopsies consistent with GERD, no Barrett  . ESOPHAGOGASTRODUODENOSCOPY  03/21/2009   Dr. Doy Mince: Reflux esophagitis, gastritis without H. pylori, esophagus stretched 57 savory  . ESOPHAGOGASTRODUODENOSCOPY  02/20/2008   Dr. Doy Mince: Esophagus dilated to 27 French, reactive gastropathy with no H pylori. No Barrett's on esophageal biopsy  . ESOPHAGOGASTRODUODENOSCOPY  09/24/2006   Dr. Doy Mince: Tight wrap noted, reactive gastropathy,  no Barrett's  . ESOPHAGOGASTRODUODENOSCOPY (EGD) WITH PROPOFOL N/A 01/09/2017   Procedure: ESOPHAGOGASTRODUODENOSCOPY (EGD) WITH PROPOFOL;  Surgeon: Daneil Dolin, MD;  [facility]: [facility];  Service: Endoscopy;  Laterality: N/A;  2:15PM  . FRACTURE SURGERY Left    wrist  . JOINT REPLACEMENT    . MALONEY DILATION N/A 01/09/2017   Procedure: Venia Minks DILATION;  Surgeon: Daneil Dolin, MD;  Location: [facility];  Service: Endoscopy;  Laterality: N/A;  . MITRAL VALVE REPAIR  11/2013  . NISSEN FUNDOPLICATION  [id number]  . REPLACEMENT TOTAL KNEE BILATERAL  2010/2013    Allergies  Allergen Reactions  . Penicillins Anaphylaxis and Other (See Comments)    Has patient had a PCN reaction causing immediate rash, facial/tongue/throat swelling, SOB or lightheadedness with hypotension: Yes Has patient had a PCN reaction causing severe rash involving mucus membranes or skin necrosis: Yes Has patient had a PCN reaction that required hospitalization Yes Has patient had a PCN reaction occurring within the last 10 years: No If all of the above answers are "NO", then may proceed with Cephalosporin use.   . Latex Other (See Comments)    Redness and rash  . Morphine And Related Nausea Only and Other (See Comments)    Dizziness  . Tape Other (See Comments)    Redness and rash  . Iodine Other (See Comments)    Redness and rash    Outpatient Encounter Medications as of 12/11/2018  Medication Sig  . acetaminophen (TYLENOL) 650 MG CR tablet Take 650-1,300 mg by mouth every 8 (eight) hours as needed for pain.   Marland Kitchen amLODipine (NORVASC) 5 MG tablet Take 1 tablet (5 mg total) by mouth daily.  Marland Kitchen aspirin EC 81 MG tablet Take 81 mg by mouth every morning.   Marland Kitchen atorvastatin (LIPITOR) 80 MG tablet Take 1 tablet (80 mg total) by mouth daily.  . calcium citrate-vitamin D (CITRACAL+D) 315-200 MG-UNIT tablet Take 1 tablet by mouth 2 (two) times daily.  . cetirizine (ZYRTEC) 10 MG chewable tablet Chew 10 mg by mouth  daily.  . Continuous Blood Gluc Receiver (FREESTYLE LIBRE 14 DAY READER) DEVI 1 application by Does not apply route 3 (three) times daily.  . Continuous Blood Gluc Sensor (FREESTYLE LIBRE 14 DAY SENSOR) MISC 1 application by Does not apply route 3 (three) times daily.  . fluticasone (FLONASE) 50 MCG/ACT nasal spray USE 2 SPRAYS IN EACH NOSTRIL EVERY DAY  . furosemide (LASIX) 20 MG tablet Take 1 tablet (20 mg total) by mouth daily.  Marland Kitchen gabapentin (NEURONTIN) 100 MG capsule Take 100 mg by mouth See admin instructions. Taking 2 tabs in morning and 3 tabs at night  . lisinopril (ZESTRIL) 20 MG tablet Take 1 tablet (20 mg total) by mouth daily.  Marland Kitchen loperamide (IMODIUM A-D) 2 MG tablet Take 2 mg by mouth as needed for diarrhea or loose stools.  . metFORMIN (GLUCOPHAGE) 1000 MG tablet Take 1 tablet (1,000 mg total) by mouth 2 (two) times daily with a meal.  . metoprolol tartrate (LOPRESSOR) 25  MG tablet Take 0.5 tablets (12.5 mg total) by mouth 2 (two) times daily.  . potassium chloride (K-DUR) 10 MEQ tablet Take 1 tablet (10 mEq total) by mouth daily.  . Probiotic Product (FORTIFY DAILY PROBIOTIC PO) Take by mouth.  . tobramycin (TOBREX) 0.3 % ophthalmic solution   . Vitamin D, Ergocalciferol, (DRISDOL) 50000 units CAPS capsule Take 1 capsule (50,000 Units total) by mouth every 7 (seven) days.  Marland Kitchen warfarin (COUMADIN) 6 MG tablet TAKE 1 TABLET EVERY DAY  . [DISCONTINUED] predniSONE (DELTASONE) 10 MG tablet Take 20 mg tablet (2 tablets) by mouth x 1 dose then take 10 mg tablet daily then 5 mg tablet (1/2 tablet daily x 1 dose then stop.   No facility-administered encounter medications on file as of 12/11/2018.     Review of Systems:  Review of Systems  Constitutional: Negative for activity change, appetite change and fatigue.  Eyes: Negative for photophobia and visual disturbance.  Respiratory: Negative for cough, shortness of breath and wheezing.   Cardiovascular: Negative for chest pain, palpitations  and leg swelling.  Endocrine: Negative for polydipsia, polyphagia and polyuria.  Genitourinary: Negative for dysuria, frequency and hematuria.  Musculoskeletal: Positive for arthralgias and myalgias.  Skin: Negative.   Neurological: Negative for dizziness, numbness and headaches.  Hematological: Bruises/bleeds easily.  Psychiatric/Behavioral: Negative for dysphoric mood and sleep disturbance. The patient is not nervous/anxious.   All other systems reviewed and are negative.   Health Maintenance  Topic Date Due  . FOOT EXAM  09/06/2018  . INFLUENZA VACCINE  10/25/2018  . HEMOGLOBIN A1C  03/06/2019  . OPHTHALMOLOGY EXAM  05/14/2019  . TETANUS/TDAP  10/13/2026  . DEXA SCAN  Completed  . PNA vac Low Risk Adult  Completed    Physical Exam: Vitals:   12/11/18 1343  BP: 128/68  Pulse: (!) 59  Temp: 97.8 F (36.6 C)  TempSrc: Oral  SpO2: 96%  Weight: 167 lb 12.8 oz (76.1 kg)  Height: 5\' 2"  (1.575 m)   Body mass index is 30.69 kg/m. Physical Exam Vitals signs reviewed.  Constitutional:      General: She is not in acute distress.    Appearance: Normal appearance. She is normal weight. She is not toxic-appearing.  HENT:     Head: Normocephalic.  Neck:     Musculoskeletal: Normal range of motion and neck supple.  Cardiovascular:     Rate and Rhythm: Normal rate and regular rhythm.     Pulses: Normal pulses.          Dorsalis pedis pulses are 2+ on the right side and 2+ on the left side.       Posterior tibial pulses are 2+ on the right side and 2+ on the left side.     Heart sounds: Normal heart sounds. No murmur.  Pulmonary:     Effort: Pulmonary effort is normal. No respiratory distress.     Breath sounds: Normal breath sounds. No wheezing.  Musculoskeletal: Normal range of motion.     Right lower leg: No edema.     Left lower leg: No edema.  Feet:     Right foot:     Skin integrity: Skin integrity normal. No skin breakdown.     Toenail Condition: Fungal disease  present.    Left foot:     Skin integrity: Skin integrity normal. No skin breakdown.     Toenail Condition: Fungal disease present. Skin:    General: Skin is warm and dry.  Capillary Refill: Capillary refill takes less than 2 seconds.  Neurological:     General: No focal deficit present.     Mental Status: She is alert and oriented to person, place, and time. Mental status is at baseline.  Psychiatric:        Mood and Affect: Mood normal.        Behavior: Behavior normal.        Thought Content: Thought content normal.        Judgment: Judgment normal.     Labs reviewed: Basic Metabolic Panel: Recent Labs    01/06/18 0820 04/24/18 1615 09/04/18 0939  NA 140 139 140  K 4.4 4.5 4.8  CL 106 101 103  CO2 28 28 28   GLUCOSE 124* 130 118*  BUN 19 25 18   CREATININE 0.79 0.96* 0.90  CALCIUM 9.5 9.9 9.4   Liver Function Tests: Recent Labs    01/06/18 0820 04/24/18 1615 09/04/18 0939  AST 21 21 19   ALT 17 17 14   ALKPHOS 79  --   --   BILITOT 0.7 0.4 0.6  PROT 7.5 7.3 6.9  ALBUMIN 3.9  --   --    No results for input(s): LIPASE, AMYLASE in the last 8760 hours. No results for input(s): AMMONIA in the last 8760 hours. CBC: Recent Labs    01/06/18 0820 04/24/18 1615 09/04/18 0939  WBC 9.9 11.2* 9.7  NEUTROABS 6.5 9,475* 6,936  HGB 12.3 13.4 13.1  HCT 39.2 40.2 40.0  MCV 88.7 84.6 86.2  PLT 395 393 412*   Lipid Panel: Recent Labs    01/06/18 0820 09/04/18 0939  CHOL 131 142  HDL 35* 40*  LDLCALC 61 74  TRIG 173* 183*  CHOLHDL 3.7 3.6   Lab Results  Component Value Date   HGBA1C 7.7 (H) 09/04/2018    Procedures since last visit: No results found.  Assessment/Plan 1. Need for influenza vaccination - administer Flu Vaccine QUAD High Dose(Fluad) vaccination  2. Uncontrolled hypertension - bp improved and at goal of less than < 150/90 - continue current bp regimen - continue to take bp at least weekly and record - continue DASH diet - encourage  exercise - complete blood count with platelets- future - complete blood count with GFR- future - lipid panel- future  3. Type 2 diabetes mellitus with chronic painful diabetic neuropathy (HCC) - blood glucose remains high in the evening - suspect plant based diet not giving her enough protein and making poor food choices - start Jardiance 10 mg daily with dinner - encourage diet low in carbs and sugar - encourage exercise - hemoglobin A1c- today  - hemoglobin A1c- future - foot exam- future - microalbumin- future - eye exam-  Please schedule yearly retinal exam  4. Macular degeneration of both eyes, unspecified type - ongoing, followed by Dr. Baird Cancer  5. Body mass index (BMI) of 30.0-30.9 in adult - ongoing, not exercising or following diet to reduce weight - refer to weight and wellness clinic  6. Visual impairment - has not ordered life alert, encouraged to do so - encourage falls management plan     Labs/tests ordered:  Complete blood count with platelets, complete metabolic panel with GFR, hemoglobin A1c- today and future, foot exam, eye exam future Next appt:  01/14/2019

## 2018-12-11 NOTE — Patient Instructions (Signed)
Be sure to drink at least 6 8oz glasses of water.

## 2018-12-12 LAB — BASIC METABOLIC PANEL
BUN: 24 mg/dL (ref 7–25)
CO2: 29 mmol/L (ref 20–32)
Calcium: 9.8 mg/dL (ref 8.6–10.4)
Chloride: 103 mmol/L (ref 98–110)
Creat: 0.86 mg/dL (ref 0.60–0.93)
Glucose, Bld: 126 mg/dL (ref 65–139)
Potassium: 4.3 mmol/L (ref 3.5–5.3)
Sodium: 142 mmol/L (ref 135–146)

## 2018-12-12 LAB — HEMOGLOBIN A1C
Hgb A1c MFr Bld: 7.7 % of total Hgb — ABNORMAL HIGH (ref <5.7)
Mean Plasma Glucose: 174 (calc)
eAG (mmol/L): 9.7 (calc)

## 2018-12-13 ENCOUNTER — Other Ambulatory Visit: Payer: Self-pay | Admitting: Internal Medicine

## 2018-12-13 DIAGNOSIS — J302 Other seasonal allergic rhinitis: Secondary | ICD-10-CM

## 2018-12-14 ENCOUNTER — Encounter: Payer: Self-pay | Admitting: Internal Medicine

## 2018-12-15 ENCOUNTER — Other Ambulatory Visit: Payer: Self-pay | Admitting: *Deleted

## 2018-12-15 DIAGNOSIS — I10 Essential (primary) hypertension: Secondary | ICD-10-CM

## 2018-12-15 MED ORDER — LISINOPRIL 20 MG PO TABS: 20.0000 mg | ORAL_TABLET | Freq: Every day | ORAL | 1 refills | Status: DC

## 2018-12-15 NOTE — Telephone Encounter (Signed)
[employer]

## 2018-12-16 ENCOUNTER — Other Ambulatory Visit: Payer: Self-pay

## 2018-12-16 ENCOUNTER — Ambulatory Visit (INDEPENDENT_AMBULATORY_CARE_PROVIDER_SITE_OTHER): Payer: Medicare Other | Admitting: *Deleted

## 2018-12-16 DIAGNOSIS — Z5181 Encounter for therapeutic drug level monitoring: Secondary | ICD-10-CM

## 2018-12-16 DIAGNOSIS — I4891 Unspecified atrial fibrillation: Secondary | ICD-10-CM

## 2018-12-16 DIAGNOSIS — Z8673 Personal history of transient ischemic attack (TIA), and cerebral infarction without residual deficits: Secondary | ICD-10-CM

## 2018-12-16 LAB — POCT INR: INR: 2.7 (ref 2.0–3.0)

## 2018-12-16 NOTE — Patient Instructions (Signed)
Took coumadin this morning. Continue coumadin 1 tablet daily except 1/2 tablet on Sundays Recheck in 6 weeks

## 2018-12-23 ENCOUNTER — Inpatient Hospital Stay (HOSPITAL_COMMUNITY)
Admission: EM | Admit: 2018-12-23 | Discharge: 2018-12-26 | DRG: 064 | Disposition: A | Discharge status: Home Health | Payer: Medicare Other | Attending: Family Medicine | Admitting: Family Medicine

## 2018-12-23 ENCOUNTER — Emergency Department (HOSPITAL_COMMUNITY): Payer: Medicare Other

## 2018-12-23 ENCOUNTER — Encounter (HOSPITAL_COMMUNITY): Payer: Self-pay

## 2018-12-23 ENCOUNTER — Other Ambulatory Visit: Payer: Self-pay

## 2018-12-23 ENCOUNTER — Telehealth: Payer: Self-pay | Admitting: Neurology

## 2018-12-23 DIAGNOSIS — Z7982 Long term (current) use of aspirin: Secondary | ICD-10-CM

## 2018-12-23 DIAGNOSIS — I6349 Cerebral infarction due to embolism of other cerebral artery: Secondary | ICD-10-CM | POA: Diagnosis not present

## 2018-12-23 DIAGNOSIS — E7800 Pure hypercholesterolemia, unspecified: Secondary | ICD-10-CM | POA: Diagnosis present

## 2018-12-23 DIAGNOSIS — I110 Hypertensive heart disease with heart failure: Secondary | ICD-10-CM | POA: Diagnosis present

## 2018-12-23 DIAGNOSIS — I639 Cerebral infarction, unspecified: Secondary | ICD-10-CM | POA: Diagnosis present

## 2018-12-23 DIAGNOSIS — R42 Dizziness and giddiness: Secondary | ICD-10-CM | POA: Diagnosis not present

## 2018-12-23 DIAGNOSIS — Z79890 Hormone replacement therapy (postmenopausal): Secondary | ICD-10-CM

## 2018-12-23 DIAGNOSIS — R000 Tachycardia, unspecified: Secondary | ICD-10-CM | POA: Diagnosis not present

## 2018-12-23 DIAGNOSIS — E118 Type 2 diabetes mellitus with unspecified complications: Secondary | ICD-10-CM | POA: Diagnosis present

## 2018-12-23 DIAGNOSIS — I5042 Chronic combined systolic (congestive) and diastolic (congestive) heart failure: Secondary | ICD-10-CM | POA: Diagnosis present

## 2018-12-23 DIAGNOSIS — H81399 Other peripheral vertigo, unspecified ear: Secondary | ICD-10-CM | POA: Diagnosis present

## 2018-12-23 DIAGNOSIS — Z833 Family history of diabetes mellitus: Secondary | ICD-10-CM

## 2018-12-23 DIAGNOSIS — R29700 NIHSS score 0: Secondary | ICD-10-CM | POA: Diagnosis present

## 2018-12-23 DIAGNOSIS — E119 Type 2 diabetes mellitus without complications: Secondary | ICD-10-CM | POA: Diagnosis present

## 2018-12-23 DIAGNOSIS — Z87891 Personal history of nicotine dependence: Secondary | ICD-10-CM

## 2018-12-23 DIAGNOSIS — Z96653 Presence of artificial knee joint, bilateral: Secondary | ICD-10-CM | POA: Diagnosis present

## 2018-12-23 DIAGNOSIS — E1169 Type 2 diabetes mellitus with other specified complication: Secondary | ICD-10-CM | POA: Diagnosis present

## 2018-12-23 DIAGNOSIS — Z951 Presence of aortocoronary bypass graft: Secondary | ICD-10-CM

## 2018-12-23 DIAGNOSIS — E669 Obesity, unspecified: Secondary | ICD-10-CM | POA: Diagnosis present

## 2018-12-23 DIAGNOSIS — Z6830 Body mass index (BMI) 30.0-30.9, adult: Secondary | ICD-10-CM

## 2018-12-23 DIAGNOSIS — Z90710 Acquired absence of both cervix and uterus: Secondary | ICD-10-CM

## 2018-12-23 DIAGNOSIS — E785 Hyperlipidemia, unspecified: Secondary | ICD-10-CM | POA: Diagnosis present

## 2018-12-23 DIAGNOSIS — Z79899 Other long term (current) drug therapy: Secondary | ICD-10-CM

## 2018-12-23 DIAGNOSIS — U071 COVID-19: Secondary | ICD-10-CM | POA: Diagnosis present

## 2018-12-23 DIAGNOSIS — Z7951 Long term (current) use of inhaled steroids: Secondary | ICD-10-CM

## 2018-12-23 DIAGNOSIS — I4891 Unspecified atrial fibrillation: Secondary | ICD-10-CM | POA: Diagnosis not present

## 2018-12-23 DIAGNOSIS — Z7401 Bed confinement status: Secondary | ICD-10-CM | POA: Diagnosis not present

## 2018-12-23 DIAGNOSIS — I2510 Atherosclerotic heart disease of native coronary artery without angina pectoris: Secondary | ICD-10-CM | POA: Diagnosis not present

## 2018-12-23 DIAGNOSIS — I480 Paroxysmal atrial fibrillation: Secondary | ICD-10-CM

## 2018-12-23 DIAGNOSIS — I6389 Other cerebral infarction: Secondary | ICD-10-CM

## 2018-12-23 DIAGNOSIS — M797 Fibromyalgia: Secondary | ICD-10-CM | POA: Diagnosis not present

## 2018-12-23 DIAGNOSIS — Z9049 Acquired absence of other specified parts of digestive tract: Secondary | ICD-10-CM

## 2018-12-23 DIAGNOSIS — Z953 Presence of xenogenic heart valve: Secondary | ICD-10-CM

## 2018-12-23 DIAGNOSIS — G459 Transient cerebral ischemic attack, unspecified: Secondary | ICD-10-CM | POA: Diagnosis not present

## 2018-12-23 DIAGNOSIS — Z7901 Long term (current) use of anticoagulants: Secondary | ICD-10-CM

## 2018-12-23 DIAGNOSIS — H3530 Unspecified macular degeneration: Secondary | ICD-10-CM | POA: Diagnosis present

## 2018-12-23 DIAGNOSIS — I10 Essential (primary) hypertension: Secondary | ICD-10-CM | POA: Diagnosis present

## 2018-12-23 DIAGNOSIS — I672 Cerebral atherosclerosis: Secondary | ICD-10-CM | POA: Diagnosis not present

## 2018-12-23 DIAGNOSIS — Z823 Family history of stroke: Secondary | ICD-10-CM

## 2018-12-23 DIAGNOSIS — H9190 Unspecified hearing loss, unspecified ear: Secondary | ICD-10-CM | POA: Diagnosis present

## 2018-12-23 DIAGNOSIS — Z91041 Radiographic dye allergy status: Secondary | ICD-10-CM

## 2018-12-23 LAB — RAPID URINE DRUG SCREEN, HOSP PERFORMED
Amphetamines: NOT DETECTED
Barbiturates: NOT DETECTED
Benzodiazepines: NOT DETECTED
Cocaine: NOT DETECTED
Opiates: NOT DETECTED
Tetrahydrocannabinol: NOT DETECTED

## 2018-12-23 LAB — CBC
HCT: 41.2 % (ref 36.0–46.0)
Hemoglobin: 12.8 g/dL (ref 12.0–15.0)
MCH: 27.8 pg (ref 26.0–34.0)
MCHC: 31.1 g/dL (ref 30.0–36.0)
MCV: 89.4 fL (ref 80.0–100.0)
Platelets: 422 10*3/uL — ABNORMAL HIGH (ref 150–400)
RBC: 4.61 MIL/uL (ref 3.87–5.11)
RDW: 13.9 % (ref 11.5–15.5)
WBC: 10.3 10*3/uL (ref 4.0–10.5)
nRBC: 0 % (ref 0.0–0.2)

## 2018-12-23 LAB — URINALYSIS, ROUTINE W REFLEX MICROSCOPIC
Bilirubin Urine: NEGATIVE
Glucose, UA: >=500 mg/dL — AB
Ketones, ur: NEGATIVE mg/dL
Nitrite: POSITIVE — AB
Protein, ur: NEGATIVE mg/dL
Specific Gravity, Urine: 1.017 (ref 1.005–1.030)
pH: 5 (ref 5.0–8.0)

## 2018-12-23 LAB — BASIC METABOLIC PANEL
Anion gap: 9 (ref 5–15)
BUN: 25 mg/dL — ABNORMAL HIGH (ref 8–23)
CO2: 22 mmol/L (ref 22–32)
Calcium: 9.3 mg/dL (ref 8.9–10.3)
Chloride: 106 mmol/L (ref 98–111)
Creatinine, Ser: 0.92 mg/dL (ref 0.44–1.00)
GFR calc Af Amer: >60 mL/min (ref >60)
GFR calc non Af Amer: >60 mL/min (ref >60)
Glucose, Bld: 147 mg/dL — ABNORMAL HIGH (ref 70–99)
Potassium: 4.1 mmol/L (ref 3.5–5.1)
Sodium: 137 mmol/L (ref 135–145)

## 2018-12-23 LAB — TROPONIN I (HIGH SENSITIVITY)
Troponin I (High Sensitivity): 10 ng/L (ref <18)
Troponin I (High Sensitivity): 12 ng/L (ref <18)

## 2018-12-23 LAB — APTT: aPTT: 37 seconds — ABNORMAL HIGH (ref 24–36)

## 2018-12-23 LAB — PROTIME-INR
INR: 2 — ABNORMAL HIGH (ref 0.8–1.2)
Prothrombin Time: 22.6 seconds — ABNORMAL HIGH (ref 11.4–15.2)

## 2018-12-23 LAB — SARS CORONAVIRUS 2 BY RT PCR (HOSPITAL ORDER, PERFORMED IN ~~LOC~~ HOSPITAL LAB): SARS Coronavirus 2: POSITIVE — AB

## 2018-12-23 MED ORDER — CIPROFLOXACIN HCL 250 MG PO TABS
250.0000 mg | ORAL_TABLET | Freq: Once | ORAL | Status: AC
  Administered 2018-12-23: 250 mg via ORAL
  Filled 2018-12-23: qty 1

## 2018-12-23 MED ORDER — DILTIAZEM HCL 25 MG/5ML IV SOLN
10.0000 mg | Freq: Once | INTRAVENOUS | Status: AC
  Administered 2018-12-23: 18:00:00 10 mg via INTRAVENOUS
  Filled 2018-12-23: qty 5

## 2018-12-23 MED ORDER — SODIUM CHLORIDE 0.9 % IV BOLUS
500.0000 mL | Freq: Once | INTRAVENOUS | Status: AC
  Administered 2018-12-23: 500 mL via INTRAVENOUS

## 2018-12-23 NOTE — ED Provider Notes (Signed)
[facility] EMERGENCY DEPARTMENT Provider Note   CSN: [id number] Arrival date & time: 12/23/18  1406     History   Chief Complaint Chief Complaint  Patient presents with  . Dizziness    HPI Jeanette Yates is a 78 y.o. female with history of HLD, HTN, diabetes, TIA, atrial fibrillation on Coumadin, vertigo, CAD s/p CABG, heart failure EF 55%, macular degeneration is here for evaluation of dizziness.  Onset 3 to 4 days ago.  Describes dizziness as feeling off balance, not knowing if "up is down or not".  Episodes happen throughout the day while at rest but also with activity and movement.  Episodes last approximately 20 to 30 minutes.  Has had vertigo in the past and states it feels similar to it but more severe.  During dizziness episodes she feels off balance, feels like she is going to fall and needs to sit down.  Has also noticed bilateral blurry vision that last sometimes 1 to 2 hours after the dizziness resolves.  She also gets a cold, clammy, nauseated with slight chest heaviness.  Daughter at bedside provides more information.  She has witnessed some of these episodes and states that patient will sometimes appears to be having a hard time finding her words and will drop some of the words in the middle of a sentence.  Patient states one time she felt so fatigued and her limbs felt very heavy after 1 of these episodes and she fell asleep.  Daughter has checked her blood pressure and her CBGs during these episodes.  Her BPs have been in the 0000000 to [id number] systolic and her CBGs have been in the 100s to 250s.  She just started Jardiance for diabetes this morning.  Patient called Dr. [name] who referred her to the ER.  Patient and daughter are concerned that she is having mini strokes.  Chronic right-sided headaches unchanged.  She denies any current dizziness, vision changes, double vision.  She has no associated palpitations or syncope during these episodes.  No fall. No unilateral weakness, numbness  or drooping.      HPI  Past Medical History:  Diagnosis Date  . Adjustment disorder 10/17/2005  . Atrial fibrillation ([place])   . Cataract    bilateral  . CHF (congestive heart failure) ([place])   . Contracture of knee joint 07/26/2009  . Coronary artery disease   . Degeneration of lumbar or lumbosacral intervertebral disc 07/26/2009  . Diabetes mellitus without complication ([place])   . Esophageal spasm   . Esophageal spasm   . Fibromyalgia   . Fibromyalgia   . Hearing loss 12/18/2010  . Heart murmur   . Hypercholesteremia   . Hypertension   . 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 Leaky heart valve   . Lyme disease   . Macular degeneration   . Macular degeneration of both eyes   . Migraine 10/17/2005  . Mixed incontinence 10/17/2005  . Osteoarthritis    multiple joints   . Overweight 09/19/2010  . Pneumonia   . PONV (postoperative nausea and vomiting)   . Postartificial menopausal syndrome 10/10/2009  . Prinzmetal angina ([place]) 10/17/2005  . Psoriasis 06/12/2006  . Sciatica   . TIA (transient ischemic attack)   . Type II diabetes mellitus ([place])   . Urolith 10/17/2005    Patient Active Problem List   Diagnosis Date Noted  . Grief reaction with prolonged bereavement 04/24/2018  . Senile osteopenia 04/24/2018  . Injury of right wrist  04/24/2018  . Plantar fasciitis, bilateral 04/24/2018  . Irritable bowel syndrome with both constipation and diarrhea 04/24/2018  . Lactose intolerance 04/24/2018  . Influenza 04/24/2018  . Body mass index (BMI) of 30.0-30.9 in adult 09/05/2017  . Class 1 obesity due to excess calories with serious comorbidity and body mass index (BMI) of 30.0 to 30.9 in adult 09/05/2017  . Seasonal allergies 09/05/2017  . Hoarseness 09/05/2017  . Hyperlipidemia associated with type 2 diabetes mellitus ([place]) 09/05/2017  . Balance problem 02/11/2017  . Dysphagia 11/15/2016  . S/P AVR (aortic valve replacement) 04/13/2016  .  Encounter for therapeutic drug monitoring 01/02/2016  . Hx of TIA (transient ischemic attack) and stroke 12/06/2015  . Type 2 diabetes mellitus with complication, without long-term current use of insulin ([place])   . Osteoarthritis   . Fibromyalgia   . Atrial fibrillation ([place])   . CHF (congestive heart failure) ([place])   . Esophageal spasm   . Numbness on right side 05/03/2015  . TIA (transient ischemic attack) 05/02/2015  . Essential hypertension 05/02/2015    Past Surgical History:  Procedure Laterality Date  . ABDOMINAL HYSTERECTOMY    . AORTIC VALVE REPLACEMENT  12/10/2013   porcine  . CATARACT EXTRACTION, BILATERAL    . CHOLECYSTECTOMY  1981  . COLONOSCOPY  04/18/2006   Dr. Nelva Nay: internal hemorrhoids  . COLONOSCOPY  10/2003   Dr. Alphonsa Gin: hemorrhoids  . CORONARY ARTERY BYPASS GRAFT  2015  . ESOPHAGOGASTRODUODENOSCOPY  10/16/2013   Dr. Marla Roe: prior Nissen fundoplication intact, hypertonic LES, dilated up to 53 [place] with moderate resistance, gastritis but no H. pylori., Reactive gastritis, no celiac disease.  . ESOPHAGOGASTRODUODENOSCOPY  05/29/2012   Dr. Doy Mince: Moderately severe esophagitis, acute gastritis reactive, no H pylori, no celiac. Esophageal biopsies consistent with GERD, no Barrett  . ESOPHAGOGASTRODUODENOSCOPY  03/21/2009   Dr. Doy Mince: Reflux esophagitis, gastritis without H. pylori, esophagus stretched 57 savory  . ESOPHAGOGASTRODUODENOSCOPY  02/20/2008   Dr. Doy Mince: Esophagus dilated to 29 French, reactive gastropathy with no H pylori. No Barrett's on esophageal biopsy  . ESOPHAGOGASTRODUODENOSCOPY  09/24/2006   Dr. Doy Mince: Tight wrap noted, reactive gastropathy, no Barrett's  . ESOPHAGOGASTRODUODENOSCOPY (EGD) WITH PROPOFOL N/A 01/09/2017   Procedure: ESOPHAGOGASTRODUODENOSCOPY (EGD) WITH PROPOFOL;  Surgeon: Daneil Dolin, MD;  Location: [facility] ENDO SUITE;  Service: Endoscopy;  Laterality: N/A;  2:15PM  . FRACTURE SURGERY Left    wrist  .  JOINT REPLACEMENT    . MALONEY DILATION N/A 01/09/2017   Procedure: Venia Minks DILATION;  Surgeon: Daneil Dolin, MD;  Location: [facility] ENDO SUITE;  Service: Endoscopy;  Laterality: N/A;  . MITRAL VALVE REPAIR  11/2013  . NISSEN FUNDOPLICATION  [id number]  . REPLACEMENT TOTAL KNEE BILATERAL  2010/2013     OB History    Gravida  3   Para  3   Term  3   Preterm      AB      Living  2     SAB      TAB      Ectopic      Multiple      Live Births               Home Medications    Prior to Admission medications   Medication Sig Start Date End Date Taking? Authorizing Provider  acetaminophen (TYLENOL) 650 MG CR tablet Take 650-1,300 mg by mouth every 8 (eight) hours as needed for pain.     [provider]  amLODipine (NORVASC) 5 MG tablet Take 1 tablet (5 mg total) by mouth daily. 11/26/18   Reed, Tiffany L, DO  aspirin EC 81 MG tablet Take 81 mg by mouth every morning.     [provider]  atorvastatin (LIPITOR) 80 MG tablet TAKE 1 TABLET (80 MG TOTAL) BY MOUTH DAILY. 12/15/18   Reed, Tiffany L, DO  calcium citrate-vitamin D (CITRACAL+D) 315-200 MG-UNIT tablet Take 1 tablet by mouth 2 (two) times daily.    [provider]  cetirizine (ZYRTEC) 10 MG chewable tablet Chew 10 mg by mouth daily.    [provider]  Continuous Blood Gluc Receiver (FREESTYLE LIBRE 14 DAY READER) DEVI 1 application by Does not apply route 3 (three) times daily. 10/30/18   Reed, Tiffany L, DO  Continuous Blood Gluc Sensor (FREESTYLE LIBRE 14 DAY SENSOR) MISC 1 application by Does not apply route 3 (three) times daily. 10/30/18   Reed, Tiffany L, DO  empagliflozin (JARDIANCE) 10 MG TABS tablet Take 10 mg by mouth daily before supper. 12/11/18   Reed, Tiffany L, DO  fluticasone (FLONASE) 50 MCG/ACT nasal spray USE 2 SPRAYS IN EACH NOSTRIL EVERY DAY 12/15/18   Reed, Tiffany L, DO  furosemide (LASIX) 20 MG tablet TAKE 1 TABLET (20 MG TOTAL) BY MOUTH DAILY. 12/15/18   Reed, Tiffany L,  DO  gabapentin (NEURONTIN) 100 MG capsule Take 100 mg by mouth See admin instructions. Taking 2 tabs in morning and 3 tabs at night    [provider]  lisinopril (ZESTRIL) 20 MG tablet Take 1 tablet (20 mg total) by mouth daily. 12/15/18   Reed, Tiffany L, DO  loperamide (IMODIUM A-D) 2 MG tablet Take 2 mg by mouth as needed for diarrhea or loose stools.    [provider]  metFORMIN (GLUCOPHAGE) 1000 MG tablet Take 1 tablet (1,000 mg total) by mouth 2 (two) times daily with a meal. 09/04/18   Reed, Tiffany L, DO  metoprolol tartrate (LOPRESSOR) 25 MG tablet TAKE 1/2 TABLET TWO TIMES DAILY. 12/15/18   Reed, Tiffany L, DO  potassium chloride (K-DUR) 10 MEQ tablet TAKE 1 TABLET EVERY DAY 12/15/18   Reed, Tiffany L, DO  Probiotic Product (FORTIFY DAILY PROBIOTIC PO) Take by mouth.    [provider]  tobramycin (TOBREX) 0.3 % ophthalmic solution  07/08/18   [provider]  Vitamin D, Ergocalciferol, (DRISDOL) 50000 units CAPS capsule Take 1 capsule (50,000 Units total) by mouth every 7 (seven) days. 01/09/18   Reed, Tiffany L, DO  warfarin (COUMADIN) 6 MG tablet TAKE 1 TABLET EVERY DAY 06/05/18   Arnoldo Lenis, MD    Family History Family History  Problem Relation Age of Onset  . Stroke Mother   . Heart failure Mother   . Heart disease Mother   . Mitral valve prolapse Mother   . Diabetes Father   . Heart attack Father   . Stroke Father   . Heart disease Father   . Hypertension Father   . Alzheimer's disease Father   . Stroke Maternal Grandmother   . Arthritis Maternal Grandfather        hands  . Breast cancer Paternal Grandmother   . Osteoporosis Paternal Grandmother   . Breast cancer Cousin   . Colon cancer Neg Hx     Social History Social History   Tobacco Use  . Smoking status: Former Smoker    Packs/day: 1.00    Years: 20.00    Pack years: 20.00  Types: Cigarettes    Quit date: 09/12/1966    Years since quitting: 52.3  . Smokeless  tobacco: Never Used  Substance Use Topics  . Alcohol use: No  . Drug use: No     Allergies   Penicillins, Latex, Morphine and related, Tape, and Iodine   Review of Systems Review of Systems  Constitutional: Positive for fatigue.  Eyes: Positive for visual disturbance.  Gastrointestinal: Positive for nausea.  Neurological: Positive for dizziness.  All other systems reviewed and are negative.    Physical Exam Updated Vital Signs BP 128/77   Pulse (!) 101   Temp 98.4 F (36.9 C) (Oral)   Resp (!) 21   Ht 5\' 2"  (1.575 m)   Wt 65.3 kg   SpO2 98%   BMI 26.34 kg/m   Physical Exam Vitals signs and nursing note reviewed.  Constitutional:      General: She is not in acute distress.    Appearance: She is well-developed.     Comments: NAD.  HENT:     Head: Normocephalic and atraumatic.     Right Ear: External ear normal.     Left Ear: External ear normal.     Nose: Nose normal.  Eyes:     Conjunctiva/sclera: Conjunctivae normal.     Comments: No nystagmus   Neck:     Musculoskeletal: Normal range of motion and neck supple.  Cardiovascular:     Rate and Rhythm: Tachycardia present. Rhythm irregular.     Heart sounds: Normal heart sounds.     Comments: HR labile from high 90s-145s during encounter. Patient asymptomatic.  1+ radial and DP pulses bilaterally. No LE edema.  Pulmonary:     Effort: Pulmonary effort is normal.     Breath sounds: Normal breath sounds.  Musculoskeletal: Normal range of motion.        General: No deformity.  Skin:    General: Skin is warm and dry.     Capillary Refill: Capillary refill takes less than 2 seconds.  Neurological:     Mental Status: She is alert and oriented to person, place, and time.     Comments:   Mental Status: Patient is awake, alert, oriented to person, place, year, and situation. Patient is able to give a clear and coherent history.  Speech is fluent and clear without dysarthria or aphasia.  No signs of neglect.   Cranial Nerves: I not tested II visual fields full bilaterally. PERRL.  Unable to visualize posterior eye. III, IV, VI EOMs intact without ptosis or diplopia  V sensation to light touch intact in all 3 divisions of trigeminal nerve bilaterally  VII facial movements symmetric bilaterally VIII hearing intact to voice/conversation  IX, X no uvula deviation, symmetric rise of soft palate/uvula XI 5/5 SCM and trapezius strength bilaterally  XII tongue protrusion midline, symmetric L/R movements  Motor: Strength 5/5 in upper/lower extremities .   Sensation to light touch intact in face, upper/lower extremities. No pronator drift. No leg drop.  Cerebellar: No ataxia with finger to nose and heel to shin. Steady gait. Normal Romberg.   Psychiatric:        Behavior: Behavior normal.        Thought Content: Thought content normal.        Judgment: Judgment normal.      ED Treatments / Results  Labs (all labs ordered are listed, but only abnormal results are displayed) Labs Reviewed  CBC - Abnormal; Notable for the following components:  Result Value   Platelets 422 (*)    All other components within normal limits  BASIC METABOLIC PANEL - Abnormal; Notable for the following components:   Glucose, Bld 147 (*)    BUN 25 (*)    All other components within normal limits  PROTIME-INR - Abnormal; Notable for the following components:   Prothrombin Time 22.6 (*)    INR 2.0 (*)    All other components within normal limits  APTT - Abnormal; Notable for the following components:   aPTT 37 (*)    All other components within normal limits  URINALYSIS, ROUTINE W REFLEX MICROSCOPIC - Abnormal; Notable for the following components:   APPearance HAZY (*)    Glucose, UA >=500 (*)    Hgb urine dipstick SMALL (*)    Nitrite POSITIVE (*)    Leukocytes,Ua TRACE (*)    Bacteria, UA RARE (*)    All other components within normal limits  SARS CORONAVIRUS 2 (HOSPITAL ORDER, [facility])  RAPID URINE DRUG SCREEN, HOSP PERFORMED  TROPONIN I (HIGH SENSITIVITY)  TROPONIN I (HIGH SENSITIVITY)    EKG None  Radiology No results found.  Procedures Procedures (including critical care time)  Medications Ordered in ED Medications  sodium chloride 0.9 % bolus 500 mL (500 mLs Intravenous New Bag/Given 12/23/18 1809)  diltiazem (CARDIZEM) injection 10 mg (10 mg Intravenous Given 12/23/18 1809)  ciprofloxacin (CIPRO) tablet 250 mg (250 mg Oral Given 12/23/18 2032)     Initial Impression / Assessment and Plan / ED Course  I have reviewed the triage vital signs and the nursing notes.  Pertinent labs & imaging results that were available during my care of the patient were reviewed by me and considered in my medical decision making (see chart for details).  Patient's EMR reviewed.  She has carotid artery stenosis from last carotid ultrasound 09/2017.  Echocardiogram in 2018 shows mild LVH and EF 65% no WMA.   DDX is broad.  During my evaluation her heart rate is labile going from high 90s to 145 however patient is asymptomatic during this.  Poorly controlled atrial fibrillation rate may be contributing to some of her symptoms.  She has history of vertigo in the past and this is also a possibility.  Given duration of dizziness however 20 to 30 minutes, feeling off balance and several risk factors I am also considering TIA/CVA.  Normal neuro exam here.  I considered cardiac etiology such as ACS less likely.  ER work-up thus far reviewed by me independently, reassuring.  +Nitrites in urine, patient asymptokmatic without fever, leukocytosis, will give cipro given anaphylaxis to penicillin.    Patient needs high level of imaging.  MRI not available here at [facility], ER.  We will obtain CT angios head/neck.  1900: She was notified that patient has allergy to iodine contrast.  Considering transferring to [facility] for MRI/neurology.  We will consult neurology for  recommendations.  Evaluated by EDP, heart rate has been more persistently elevated in the 120s.  We will give 1 dose of diltiazem although patient remains asymptomatic.  2035: Spoke to Dr Aroor, highest suspicion for vertigo but given risks will need MRI brain w/o and MRA head only. No neck imaging.  Will transfer to [facility] ER for imaging.  HR now improving after diltiazem bolus.  Daughter just realized pt forgot to take her meds this morning including metoprolol.  Shared with EDP. Stable for transfer to [facility] ER. Notified EDPA Joy of  patient transfer.   Plan to order MRI brain w/o, MRA head, consult neuro if abnormal. H/o claustrophobia. Anticipate discharge with treatment for vertigo if imaging benign per Dr Aroor recommendations.  Pt will need ciprofloxacin if discharged for UTI.   Final Clinical Impressions(s) / ED Diagnoses   Final diagnoses:  Dizziness    ED Discharge Orders    None       Arlean Hopping 12/23/18 2042    Maudie Flakes, MD 12/28/18 1037

## 2018-12-23 NOTE — ED Notes (Signed)
[facility] will transport [facility] to [facility].

## 2018-12-23 NOTE — Telephone Encounter (Signed)
Daughter called after hours about patient having episodes of dizziness, cold sweats, clammy to touch, nausea, blurred vision, off balance, lethargic, weakness, short term memory loss and slight disorientation. Started 3 days ago no fever. The after hour nurse instructed daughter to take patient to the hospital. Thanks!

## 2018-12-23 NOTE — ED Notes (Signed)
Spoke with [facility] to set up transport to [place].

## 2018-12-23 NOTE — ED Notes (Signed)
Pt very upset about covid test, states that there is no way she can have Covid and that the lab or the nurse who did it must have mixed the samples, pt requesting to speak with patient relations representative tomorrow and "demands" that she is to be retested.

## 2018-12-23 NOTE — ED Notes (Signed)
CRITICAL VALUE ALERT  Critical Value:  Positive COVID  Date & Time Notied:  12/23/2018 2154  Provider Notified: Rosemarie Ax PA  Orders Received/Actions taken: see chart

## 2018-12-23 NOTE — ED Provider Notes (Signed)
Vertiginous symptoms x 1-2 months Seen at [facility], here for MRI/MRA **COVID+ Consult Aroor if positive.  5:00 - the patient has been resting quietly during ED stay while waiting for studies to be completed.  MRI/MRA show a punctate acute infarct posterior right frontal cortex. Remote right parietal infarct affecting cortex and white matter. Remote lacunar infarct right corona radiata.   Findings were discussed with Dr. Lorraine Lax. The patient will need to be admitted for further work up. Complicating factor is that she is COVID positive. Will discuss coordination of care with admitting medical team.  Discussed with Dr. Hurley Cisco, [facility], who will attempt to admit the patient at [facility] for further stroke work up.   Charlann Lange, PA-C 12/24/18 [id number]    Carmin Muskrat, MD 12/24/18 2122    Carmin Muskrat, MD 12/24/18 2129

## 2018-12-23 NOTE — Telephone Encounter (Signed)
Pt daugther stated on the way to [place].

## 2018-12-23 NOTE — ED Provider Notes (Signed)
2153: Notified by RN that lab called regarding patient's COVID-19 result POSITIVE. Pt is en route to [facility] ER.  I called [facility] and tech told me they are able to do MRI on COVID positive patients.  Afebrile here. No leukocytosis. No complaints of CP, SOB, cough.    Kinnie Feil, PA-C 12/23/18 2155    Maudie Flakes, MD 12/28/18 [phone]

## 2018-12-23 NOTE — ED Notes (Signed)
Pt given peanut butter and saltine crackers upon request; pt's family member is leaving at this time

## 2018-12-23 NOTE — ED Provider Notes (Signed)
Jeanette Yates is a 78 y.o. female, presenting to the ED with dizziness "like the sensation you feel when you are a kid and you spin around and around."  She tells me this has actually been intermittently occurring for the last month or two.  She has not noted any pattern of onset.  She has had vertigo in the past that was attributed to an inner ear issue, but spontaneously resolved.  HPI from Jeanette Sails, PA-C: "Jeanette Yates is a 78 y.o. female with history of HLD, HTN, diabetes, TIA, atrial fibrillation on Coumadin, vertigo, CAD s/p CABG, heart failure EF 55%, macular degeneration is here for evaluation of dizziness.  Onset 3 to 4 days ago.  Describes dizziness as feeling off balance, not knowing if "up is down or not".  Episodes happen throughout the day while at rest but also with activity and movement.  Episodes last approximately 20 to 30 minutes.  Has had vertigo in the past and states it feels similar to it but more severe.  During dizziness episodes she feels off balance, feels like she is going to fall and needs to sit down.  Has also noticed bilateral blurry vision that last sometimes 1 to 2 hours after the dizziness resolves.  She also gets a cold, clammy, nauseated with slight chest heaviness.  Daughter at bedside provides more information.  She has witnessed some of these episodes and states that patient will sometimes appears to be having a hard time finding her words and will drop some of the words in the middle of a sentence.  Patient states one time she felt so fatigued and her limbs felt very heavy after 1 of these episodes and she fell asleep.  Daughter has checked her blood pressure and her CBGs during these episodes.  Her BPs have been in the 0000000 to [id number] systolic and her CBGs have been in the 100s to 250s.  She just started Jardiance for diabetes this morning.  Patient called Dr. [name] who referred her to the ER.  Patient and daughter are concerned that she is having mini strokes.   Chronic right-sided headaches unchanged.  She denies any current dizziness, vision changes, double vision.  She has no associated palpitations or syncope during these episodes.  No fall. No unilateral weakness, numbness or drooping."  Past Medical History:  Diagnosis Date  . Adjustment disorder 10/17/2005  . Atrial fibrillation ([place])   . Cataract    bilateral  . CHF (congestive heart failure) ([place])   . Contracture of knee joint 07/26/2009  . Coronary artery disease   . Degeneration of lumbar or lumbosacral intervertebral disc 07/26/2009  . Diabetes mellitus without complication ([place])   . Esophageal spasm   . Esophageal spasm   . Fibromyalgia   . Fibromyalgia   . Hearing loss 12/18/2010  . Heart murmur   . Hypercholesteremia   . Hypertension   . 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 Leaky heart valve   . Lyme disease   . Macular degeneration   . Macular degeneration of both eyes   . Migraine 10/17/2005  . Mixed incontinence 10/17/2005  . Osteoarthritis    multiple joints   . Overweight 09/19/2010  . Pneumonia   . PONV (postoperative nausea and vomiting)   . Postartificial menopausal syndrome 10/10/2009  . Prinzmetal angina ([place]) 10/17/2005  . Psoriasis 06/12/2006  . Sciatica   . TIA (transient ischemic attack)   . Type II diabetes mellitus ([place])   .  Urolith 10/17/2005    Physical Exam  BP (!) 155/90   Pulse 78   Temp 98.1 F (36.7 C) (Oral)   Resp 20   Ht 5\' 2"  (1.575 m)   Wt 65.3 kg   SpO2 97%   BMI 26.34 kg/m   Physical Exam Vitals signs and nursing note reviewed.  Constitutional:      General: She is not in acute distress.    Appearance: She is well-developed. She is not diaphoretic.  HENT:     Head: Normocephalic and atraumatic.     Mouth/Throat:     Mouth: Mucous membranes are moist.     Pharynx: Oropharynx is clear.  Eyes:     Conjunctiva/sclera: Conjunctivae normal.  Neck:     Musculoskeletal: Neck supple.   Cardiovascular:     Rate and Rhythm: Normal rate and regular rhythm.     Pulses: Normal pulses.          Radial pulses are 2+ on the right side and 2+ on the left side.       Posterior tibial pulses are 2+ on the right side and 2+ on the left side.     Heart sounds: Normal heart sounds.     Comments: Tactile temperature in the extremities appropriate and equal bilaterally. Pulmonary:     Effort: Pulmonary effort is normal. No respiratory distress.     Breath sounds: Normal breath sounds.  Abdominal:     Palpations: Abdomen is soft.     Tenderness: There is no abdominal tenderness. There is no guarding.  Musculoskeletal:     Right lower leg: No edema.     Left lower leg: No edema.  Lymphadenopathy:     Cervical: No cervical adenopathy.  Skin:    General: Skin is warm and dry.  Neurological:     Mental Status: She is alert.     Comments: Sensation grossly intact to light touch in the extremities.  Grip strengths equal bilaterally.  Strength 5/5 in all extremities. No gait disturbance. Coordination intact.  Negative Romberg.  Cranial nerves III-XII grossly intact. No facial droop.   Psychiatric:        Mood and Affect: Mood and affect normal.        Speech: Speech normal.        Behavior: Behavior normal.     ED Course/Procedures     Procedures   Abnormal Labs Reviewed  SARS CORONAVIRUS 2 (HOSPITAL ORDER, [facility]) - Abnormal; Notable for the following components:      Result Value   SARS Coronavirus 2 POSITIVE (*)    All other components within normal limits  CBC - Abnormal; Notable for the following components:   Platelets 422 (*)    All other components within normal limits  BASIC METABOLIC PANEL - Abnormal; Notable for the following components:   Glucose, Bld 147 (*)    BUN 25 (*)    All other components within normal limits  PROTIME-INR - Abnormal; Notable for the following components:   Prothrombin Time 22.6 (*)    INR 2.0 (*)     All other components within normal limits  APTT - Abnormal; Notable for the following components:   aPTT 37 (*)    All other components within normal limits  URINALYSIS, ROUTINE W REFLEX MICROSCOPIC - Abnormal; Notable for the following components:   APPearance HAZY (*)    Glucose, UA >=500 (*)    Hgb urine dipstick SMALL (*)  Nitrite POSITIVE (*)    Leukocytes,Ua TRACE (*)    Bacteria, UA RARE (*)    All other components within normal limits    No results found.   EKG Interpretation  Date/Time:  Tuesday December 23 2018 15:19:19 EDT Ventricular Rate:  114 PR Interval:    QRS Duration: 132 QT Interval:  362 QTC Calculation: 498 R Axis:   -58 Text Interpretation:  Atrial fibrillation with rapid ventricular response Left axis deviation Non-specific intra-ventricular conduction block Cannot rule out Anterior infarct , age undetermined T wave abnormality, consider lateral ischemia Abnormal ECG Confirmed by Addison Lank [phone]) on 12/24/2018 12:49:23 AM       MDM    Clinical Course as of Dec 23 48  Tue Dec 23, 2018  2012 INR(!): 2.0 [CG]  2012 Nitrite(!): POSITIVE [CG]    Clinical Course User Index [CG] Kinnie Feil, PA-C   This patient was transferred from [facility] ED to obtain MRI and MRA due to recurrent dizziness.  Patient was asymptomatic during my interview with her.  No focal neuro deficits on exam.  Findings and plan of care discussed with Adrian Prows, MD. Dr. Vanita Panda personally evaluated and examined this patient.   End of shift patient care handoff report given to Charlann Lange, PA-C. Plan: Review results of MRI/MRA.  If normal, patient may be discharged with neurology and ENT follow-up. Review repeat EKG showing resolution in A. fib. Some suspicion for possible UTI on UA, correlate clinically with the patient.  Urine culture to be collected.   Vitals:   12/23/18 2247 12/23/18 2249 12/23/18 2259 12/23/18 2345  BP: (!) 155/90 (!) 155/90  (!)  136/104  Pulse:  89 78 98  Resp:  20    Temp:  98.1 F (36.7 C)    TempSrc:  Oral    SpO2:  97% 97% 98%  Weight:      Height:          Lorayne Bender, PA-C 12/24/18 0118    Carmin Muskrat, MD 12/24/18 2128

## 2018-12-23 NOTE — ED Triage Notes (Signed)
For the last few days patient has been lightheaded and dizzy daily. Pt's PCP is concerned about patient having mini strokes. Pt has episodes of getting extremely dizzy and getting cold and clammy.

## 2018-12-24 ENCOUNTER — Encounter (HOSPITAL_COMMUNITY): Payer: Self-pay | Admitting: *Deleted

## 2018-12-24 ENCOUNTER — Observation Stay (HOSPITAL_COMMUNITY): Payer: Medicare Other

## 2018-12-24 ENCOUNTER — Emergency Department (HOSPITAL_COMMUNITY): Payer: Medicare Other

## 2018-12-24 ENCOUNTER — Encounter: Payer: Self-pay | Admitting: Internal Medicine

## 2018-12-24 ENCOUNTER — Other Ambulatory Visit: Payer: Self-pay | Admitting: Internal Medicine

## 2018-12-24 DIAGNOSIS — H9190 Unspecified hearing loss, unspecified ear: Secondary | ICD-10-CM | POA: Diagnosis present

## 2018-12-24 DIAGNOSIS — I517 Cardiomegaly: Secondary | ICD-10-CM | POA: Diagnosis not present

## 2018-12-24 DIAGNOSIS — I672 Cerebral atherosclerosis: Secondary | ICD-10-CM | POA: Diagnosis not present

## 2018-12-24 DIAGNOSIS — E785 Hyperlipidemia, unspecified: Secondary | ICD-10-CM | POA: Diagnosis not present

## 2018-12-24 DIAGNOSIS — I2510 Atherosclerotic heart disease of native coronary artery without angina pectoris: Secondary | ICD-10-CM | POA: Diagnosis present

## 2018-12-24 DIAGNOSIS — R42 Dizziness and giddiness: Secondary | ICD-10-CM | POA: Diagnosis not present

## 2018-12-24 DIAGNOSIS — E118 Type 2 diabetes mellitus with unspecified complications: Secondary | ICD-10-CM

## 2018-12-24 DIAGNOSIS — U071 COVID-19: Secondary | ICD-10-CM | POA: Diagnosis present

## 2018-12-24 DIAGNOSIS — I639 Cerebral infarction, unspecified: Secondary | ICD-10-CM

## 2018-12-24 DIAGNOSIS — E669 Obesity, unspecified: Secondary | ICD-10-CM | POA: Diagnosis present

## 2018-12-24 DIAGNOSIS — I480 Paroxysmal atrial fibrillation: Secondary | ICD-10-CM

## 2018-12-24 DIAGNOSIS — Z7982 Long term (current) use of aspirin: Secondary | ICD-10-CM | POA: Diagnosis not present

## 2018-12-24 DIAGNOSIS — I10 Essential (primary) hypertension: Secondary | ICD-10-CM | POA: Diagnosis not present

## 2018-12-24 DIAGNOSIS — Z79890 Hormone replacement therapy (postmenopausal): Secondary | ICD-10-CM | POA: Diagnosis not present

## 2018-12-24 DIAGNOSIS — Z9049 Acquired absence of other specified parts of digestive tract: Secondary | ICD-10-CM | POA: Diagnosis not present

## 2018-12-24 DIAGNOSIS — M8580 Other specified disorders of bone density and structure, unspecified site: Secondary | ICD-10-CM

## 2018-12-24 DIAGNOSIS — Z823 Family history of stroke: Secondary | ICD-10-CM | POA: Diagnosis not present

## 2018-12-24 DIAGNOSIS — Z833 Family history of diabetes mellitus: Secondary | ICD-10-CM | POA: Diagnosis not present

## 2018-12-24 DIAGNOSIS — I361 Nonrheumatic tricuspid (valve) insufficiency: Secondary | ICD-10-CM

## 2018-12-24 DIAGNOSIS — I6349 Cerebral infarction due to embolism of other cerebral artery: Secondary | ICD-10-CM | POA: Diagnosis present

## 2018-12-24 DIAGNOSIS — Z87891 Personal history of nicotine dependence: Secondary | ICD-10-CM | POA: Diagnosis not present

## 2018-12-24 DIAGNOSIS — Z953 Presence of xenogenic heart valve: Secondary | ICD-10-CM | POA: Diagnosis not present

## 2018-12-24 DIAGNOSIS — Z90710 Acquired absence of both cervix and uterus: Secondary | ICD-10-CM | POA: Diagnosis not present

## 2018-12-24 DIAGNOSIS — M797 Fibromyalgia: Secondary | ICD-10-CM | POA: Diagnosis present

## 2018-12-24 DIAGNOSIS — E1169 Type 2 diabetes mellitus with other specified complication: Secondary | ICD-10-CM

## 2018-12-24 DIAGNOSIS — E7800 Pure hypercholesterolemia, unspecified: Secondary | ICD-10-CM | POA: Diagnosis present

## 2018-12-24 DIAGNOSIS — I447 Left bundle-branch block, unspecified: Secondary | ICD-10-CM | POA: Diagnosis not present

## 2018-12-24 DIAGNOSIS — E119 Type 2 diabetes mellitus without complications: Secondary | ICD-10-CM | POA: Diagnosis present

## 2018-12-24 DIAGNOSIS — Z7951 Long term (current) use of inhaled steroids: Secondary | ICD-10-CM | POA: Diagnosis not present

## 2018-12-24 DIAGNOSIS — Z7901 Long term (current) use of anticoagulants: Secondary | ICD-10-CM | POA: Diagnosis not present

## 2018-12-24 DIAGNOSIS — I5042 Chronic combined systolic (congestive) and diastolic (congestive) heart failure: Secondary | ICD-10-CM | POA: Diagnosis present

## 2018-12-24 DIAGNOSIS — Z9889 Other specified postprocedural states: Secondary | ICD-10-CM | POA: Diagnosis not present

## 2018-12-24 DIAGNOSIS — I5022 Chronic systolic (congestive) heart failure: Secondary | ICD-10-CM | POA: Diagnosis not present

## 2018-12-24 DIAGNOSIS — H3530 Unspecified macular degeneration: Secondary | ICD-10-CM | POA: Diagnosis present

## 2018-12-24 DIAGNOSIS — Z79899 Other long term (current) drug therapy: Secondary | ICD-10-CM | POA: Diagnosis not present

## 2018-12-24 DIAGNOSIS — Z96653 Presence of artificial knee joint, bilateral: Secondary | ICD-10-CM | POA: Diagnosis present

## 2018-12-24 DIAGNOSIS — Z951 Presence of aortocoronary bypass graft: Secondary | ICD-10-CM | POA: Diagnosis not present

## 2018-12-24 LAB — BASIC METABOLIC PANEL
Anion gap: 12 (ref 5–15)
BUN: 18 mg/dL (ref 8–23)
CO2: 20 mmol/L — ABNORMAL LOW (ref 22–32)
Calcium: 9 mg/dL (ref 8.9–10.3)
Chloride: 107 mmol/L (ref 98–111)
Creatinine, Ser: 0.97 mg/dL (ref 0.44–1.00)
GFR calc Af Amer: >60 mL/min (ref >60)
GFR calc non Af Amer: 57 mL/min — ABNORMAL LOW (ref >60)
Glucose, Bld: 220 mg/dL — ABNORMAL HIGH (ref 70–99)
Potassium: 3.6 mmol/L (ref 3.5–5.1)
Sodium: 139 mmol/L (ref 135–145)

## 2018-12-24 LAB — CBC WITH DIFFERENTIAL/PLATELET
Abs Immature Granulocytes: 0.02 10*3/uL (ref 0.00–0.07)
Basophils Absolute: 0.1 10*3/uL (ref 0.0–0.1)
Basophils Relative: 1 %
Eosinophils Absolute: 0.4 10*3/uL (ref 0.0–0.5)
Eosinophils Relative: 4 %
HCT: 37.1 % (ref 36.0–46.0)
Hemoglobin: 12.2 g/dL (ref 12.0–15.0)
Immature Granulocytes: 0 %
Lymphocytes Relative: 19 %
Lymphs Abs: 2 10*3/uL (ref 0.7–4.0)
MCH: 28.8 pg (ref 26.0–34.0)
MCHC: 32.9 g/dL (ref 30.0–36.0)
MCV: 87.7 fL (ref 80.0–100.0)
Monocytes Absolute: 0.8 10*3/uL (ref 0.1–1.0)
Monocytes Relative: 8 %
Neutro Abs: 7.2 10*3/uL (ref 1.7–7.7)
Neutrophils Relative %: 68 %
Platelets: 375 10*3/uL (ref 150–400)
RBC: 4.23 MIL/uL (ref 3.87–5.11)
RDW: 14.1 % (ref 11.5–15.5)
WBC: 10.5 10*3/uL (ref 4.0–10.5)
nRBC: 0 % (ref 0.0–0.2)

## 2018-12-24 LAB — PROTIME-INR
INR: 1.6 — ABNORMAL HIGH (ref 0.8–1.2)
Prothrombin Time: 18.9 seconds — ABNORMAL HIGH (ref 11.4–15.2)

## 2018-12-24 LAB — GLUCOSE, CAPILLARY
Glucose-Capillary: 214 mg/dL — ABNORMAL HIGH (ref 70–99)
Glucose-Capillary: 205 mg/dL — ABNORMAL HIGH (ref 70–99)
Glucose-Capillary: 269 mg/dL — ABNORMAL HIGH (ref 70–99)
Glucose-Capillary: 92 mg/dL (ref 70–99)

## 2018-12-24 LAB — PROCALCITONIN: Procalcitonin: <0.1 ng/mL

## 2018-12-24 LAB — ECHOCARDIOGRAM COMPLETE
Height: 62 in
Weight: 2649.05 oz

## 2018-12-24 LAB — D-DIMER, QUANTITATIVE: D-Dimer, Quant: <0.27 ug/mL-FEU (ref 0.00–0.50)

## 2018-12-24 LAB — C-REACTIVE PROTEIN: CRP: 0.8 mg/dL (ref <1.0)

## 2018-12-24 LAB — MRSA PCR SCREENING: MRSA by PCR: NEGATIVE

## 2018-12-24 MED ORDER — STROKE: EARLY STAGES OF RECOVERY BOOK
Freq: Once | Status: AC
  Administered 2018-12-24: 09:00:00
  Filled 2018-12-24 (×2): qty 1

## 2018-12-24 MED ORDER — VITAMIN D (ERGOCALCIFEROL) 1.25 MG (50000 UNIT) PO CAPS
50000.0000 [IU] | ORAL_CAPSULE | ORAL | Status: DC
  Filled 2018-12-24: qty 1

## 2018-12-24 MED ORDER — ACETAMINOPHEN 650 MG RE SUPP: 650.0000 mg | RECTAL | Status: DC | PRN

## 2018-12-24 MED ORDER — ASPIRIN EC 81 MG PO TBEC
81.0000 mg | DELAYED_RELEASE_TABLET | Freq: Every day | ORAL | Status: DC
  Administered 2018-12-24 – 2018-12-26 (×3): 81 mg via ORAL
  Filled 2018-12-24 (×3): qty 1

## 2018-12-24 MED ORDER — METOPROLOL TARTRATE 25 MG PO TABS
25.0000 mg | ORAL_TABLET | Freq: Two times a day (BID) | ORAL | Status: DC
  Administered 2018-12-24 – 2018-12-26 (×4): 25 mg via ORAL
  Filled 2018-12-24 (×5): qty 1

## 2018-12-24 MED ORDER — LORAZEPAM 2 MG/ML IJ SOLN
1.0000 mg | Freq: Once | INTRAMUSCULAR | Status: AC
  Administered 2018-12-24: 1 mg via INTRAVENOUS
  Filled 2018-12-24: qty 1

## 2018-12-24 MED ORDER — CANAGLIFLOZIN 100 MG PO TABS
100.0000 mg | ORAL_TABLET | Freq: Every day | ORAL | Status: DC
  Administered 2018-12-24 – 2018-12-26 (×3): 100 mg via ORAL
  Filled 2018-12-24 (×3): qty 1

## 2018-12-24 MED ORDER — DILTIAZEM HCL-DEXTROSE 100-5 MG/100ML-% IV SOLN (PREMIX)
5.0000 mg/h | INTRAVENOUS | Status: DC
  Administered 2018-12-24: 5 mg/h via INTRAVENOUS
  Filled 2018-12-24: qty 100

## 2018-12-24 MED ORDER — ACETAMINOPHEN 325 MG PO TABS
650.0000 mg | ORAL_TABLET | ORAL | Status: DC | PRN
  Administered 2018-12-26: 650 mg via ORAL
  Filled 2018-12-24: qty 2

## 2018-12-24 MED ORDER — ACETAMINOPHEN ER 650 MG PO TBCR: 650.0000 mg | EXTENDED_RELEASE_TABLET | Freq: Three times a day (TID) | ORAL | Status: DC | PRN

## 2018-12-24 MED ORDER — METOPROLOL TARTRATE 12.5 MG HALF TABLET
12.5000 mg | ORAL_TABLET | Freq: Two times a day (BID) | ORAL | Status: DC
  Administered 2018-12-24: 12.5 mg via ORAL
  Filled 2018-12-24: qty 1

## 2018-12-24 MED ORDER — ATORVASTATIN CALCIUM 80 MG PO TABS
80.0000 mg | ORAL_TABLET | Freq: Every day | ORAL | Status: DC
  Administered 2018-12-24 – 2018-12-26 (×3): 80 mg via ORAL
  Filled 2018-12-24 (×3): qty 1

## 2018-12-24 MED ORDER — GABAPENTIN 100 MG PO CAPS: 100.0000 mg | ORAL_CAPSULE | ORAL | Status: DC

## 2018-12-24 MED ORDER — DILTIAZEM HCL 100 MG IV SOLR
5.0000 mg/h | INTRAVENOUS | Status: DC
  Filled 2018-12-24: qty 100

## 2018-12-24 MED ORDER — CALCIUM CARBONATE-VITAMIN D 500-200 MG-UNIT PO TABS
1.0000 | ORAL_TABLET | Freq: Two times a day (BID) | ORAL | Status: DC
  Administered 2018-12-24 – 2018-12-26 (×4): 1 via ORAL
  Filled 2018-12-24 (×4): qty 1

## 2018-12-24 MED ORDER — FUROSEMIDE 20 MG PO TABS
20.0000 mg | ORAL_TABLET | Freq: Every day | ORAL | Status: DC
  Administered 2018-12-24 – 2018-12-26 (×3): 20 mg via ORAL
  Filled 2018-12-24 (×3): qty 1

## 2018-12-24 MED ORDER — WARFARIN SODIUM 3 MG PO TABS
9.0000 mg | ORAL_TABLET | Freq: Once | ORAL | Status: AC
  Administered 2018-12-24: 9 mg via ORAL
  Filled 2018-12-24 (×2): qty 3

## 2018-12-24 MED ORDER — WARFARIN - PHARMACIST DOSING INPATIENT
Freq: Every day | Status: DC
  Administered 2018-12-24: 16:00:00

## 2018-12-24 MED ORDER — POTASSIUM CHLORIDE CRYS ER 10 MEQ PO TBCR
10.0000 meq | EXTENDED_RELEASE_TABLET | Freq: Every day | ORAL | Status: DC
  Administered 2018-12-24 – 2018-12-26 (×3): 10 meq via ORAL
  Filled 2018-12-24 (×3): qty 1

## 2018-12-24 MED ORDER — DILTIAZEM HCL-DEXTROSE 100-5 MG/100ML-% IV SOLN (PREMIX)
5.0000 mg/h | INTRAVENOUS | Status: DC
  Administered 2018-12-24: 5 mg/h via INTRAVENOUS

## 2018-12-24 MED ORDER — GABAPENTIN 100 MG PO CAPS
200.0000 mg | ORAL_CAPSULE | Freq: Every day | ORAL | Status: DC
  Administered 2018-12-24 – 2018-12-26 (×3): 200 mg via ORAL
  Filled 2018-12-24 (×3): qty 2

## 2018-12-24 MED ORDER — GABAPENTIN 300 MG PO CAPS
300.0000 mg | ORAL_CAPSULE | Freq: Every day | ORAL | Status: DC
  Administered 2018-12-24 – 2018-12-25 (×2): 300 mg via ORAL
  Filled 2018-12-24 (×2): qty 1

## 2018-12-24 MED ORDER — ACETAMINOPHEN 160 MG/5ML PO SOLN: 650.0000 mg | ORAL | Status: DC | PRN

## 2018-12-24 MED ORDER — INSULIN ASPART 100 UNIT/ML ~~LOC~~ SOLN
0.0000 [IU] | Freq: Three times a day (TID) | SUBCUTANEOUS | Status: DC
  Administered 2018-12-24: 5 [IU] via SUBCUTANEOUS
  Administered 2018-12-24: 8 [IU] via SUBCUTANEOUS
  Administered 2018-12-24: 5 [IU] via SUBCUTANEOUS
  Administered 2018-12-25: 2 [IU] via SUBCUTANEOUS
  Administered 2018-12-25: 3 [IU] via SUBCUTANEOUS

## 2018-12-24 NOTE — H&P (Signed)
History and Physical    Jeanette Yates DOB: 1940/12/02 DOA: 12/23/2018  PCP: Gayland Curry, DO  Patient coming from: Home  I have personally briefly reviewed patient's old medical records in [place]  Chief Complaint: Dizziness  HPI: Jeanette Yates is a 78 y.o. female with medical history significant of A.Fib on coumadin, CAD s/p CABG, HFpEF.  Patient presents to the ED with c/o dizziness.  Sensation of room spinning.  This has been intermittently ongoing for the past month or two.  No obvious pattern of onset.  She has had vertigo in the past that was attributed to an inner ear issue, but spontaneously resolved.  During dizziness episodes she feels off balance, feels like she is going to falland needs to sit down. Has also noticed bilateral blurry vision that last sometimes 1 to 2 hours after the dizziness resolves.  Daughter has checked her blood pressure and her CBGs during these episodes. Her BPs have been in the 0000000 to [id number] systolic and her CBGs have been in the 100s to 250s. She just started Jardiance for diabetes yesterday morning.   ED Course: MRI demonstrates punctate R frontal acute stroke.  Patient with PAF with RVR in the ED.  Also her COVID has come back positive.  She has no fever, chills, SOB, or other "typical" COVID symptoms.   Review of Systems: As per HPI, otherwise all review of systems negative.  Past Medical History:  Diagnosis Date  . Adjustment disorder 10/17/2005  . Atrial fibrillation ([place])   . Cataract    bilateral  . CHF (congestive heart failure) ([place])   . Contracture of knee joint 07/26/2009  . Coronary artery disease   . Degeneration of lumbar or lumbosacral intervertebral disc 07/26/2009  . Diabetes mellitus without complication ([place])   . Esophageal spasm   . Fibromyalgia   . Hearing loss 12/18/2010  . Heart murmur   . Hypercholesteremia   . Hypertension   . Insomnia disorder related to known organic factor  10/10/2009  . Leaky heart valve   . Lyme disease   . Macular degeneration of both eyes   . Migraine 10/17/2005  . Mixed incontinence 10/17/2005  . Osteoarthritis    multiple joints   . Overweight 09/19/2010  . Pneumonia   . PONV (postoperative nausea and vomiting)   . Postartificial menopausal syndrome 10/10/2009  . Prinzmetal angina ([place]) 10/17/2005  . Psoriasis 06/12/2006  . Sciatica   . TIA (transient ischemic attack)   . Type II diabetes mellitus ([place])   . [name] 10/17/2005    Past Surgical History:  Procedure Laterality Date  . ABDOMINAL HYSTERECTOMY    . AORTIC VALVE REPLACEMENT  12/10/2013   porcine  . CATARACT EXTRACTION, BILATERAL    . CHOLECYSTECTOMY  1981  . COLONOSCOPY  04/18/2006   Dr. Nelva Nay: internal hemorrhoids  . COLONOSCOPY  10/2003   Dr. Alphonsa Gin: hemorrhoids  . CORONARY ARTERY BYPASS GRAFT  2015  . ESOPHAGOGASTRODUODENOSCOPY  10/16/2013   Dr. Marla Roe: prior Nissen fundoplication intact, hypertonic LES, dilated up to 46 [place] with moderate resistance, gastritis but no H. pylori., Reactive gastritis, no celiac disease.  . ESOPHAGOGASTRODUODENOSCOPY  05/29/2012   Dr. Doy Mince: Moderately severe esophagitis, acute gastritis reactive, no H pylori, no celiac. Esophageal biopsies consistent with GERD, no Barrett  . ESOPHAGOGASTRODUODENOSCOPY  03/21/2009   Dr. Doy Mince: Reflux esophagitis, gastritis without H. pylori, esophagus stretched 57 savory  . ESOPHAGOGASTRODUODENOSCOPY  02/20/2008   Dr. Doy Mince: Esophagus dilated  to 44 [place], reactive gastropathy with no H pylori. No Barrett's on esophageal biopsy  . ESOPHAGOGASTRODUODENOSCOPY  09/24/2006   Dr. Doy Mince: Tight wrap noted, reactive gastropathy, no Barrett's  . ESOPHAGOGASTRODUODENOSCOPY (EGD) WITH PROPOFOL N/A 01/09/2017   Procedure: ESOPHAGOGASTRODUODENOSCOPY (EGD) WITH PROPOFOL;  Surgeon: Daneil Dolin, MD;  Location[facility];  Service: Endoscopy;  Laterality: N/A;  2:15PM  .  FRACTURE SURGERY Left    wrist  . JOINT REPLACEMENT    . MALONEY DILATION N/A 01/09/2017   Procedure: Venia Minks DILATION;  Surgeon: Daneil Dolin, MD;  Location: [facility];  Service: Endoscopy;  Laterality: N/A;  . MITRAL VALVE REPAIR  11/2013  . NISSEN FUNDOPLICATION  [id number]  . REPLACEMENT TOTAL KNEE BILATERAL  2010/2013     reports that she quit smoking about 52 years ago. Her smoking use included cigarettes. She has a 20.00 pack-year smoking history. She has never used smokeless tobacco. She reports that she does not drink alcohol or use drugs.  Allergies  Allergen Reactions  . Penicillins Anaphylaxis and Other (See Comments)    Has patient had a PCN reaction causing immediate rash, facial/tongue/throat swelling, SOB or lightheadedness with hypotension: Yes Has patient had a PCN reaction causing severe rash involving mucus membranes or skin necrosis: Yes Has patient had a PCN reaction that required hospitalization Yes Has patient had a PCN reaction occurring within the last 10 years: No If all of the above answers are "NO", then may proceed with Cephalosporin use.   . Latex Other (See Comments)    Redness and rash  . Morphine And Related Nausea Only and Other (See Comments)    Dizziness  . Tape Other (See Comments)    Redness and rash  . Iodine Other (See Comments)    Redness and rash    Family History  Problem Relation Age of Onset  . Stroke Mother   . Heart failure Mother   . Heart disease Mother   . Mitral valve prolapse Mother   . Diabetes Father   . Heart attack Father   . Stroke Father   . Heart disease Father   . Hypertension Father   . Alzheimer's disease Father   . Stroke Maternal Grandmother   . Arthritis Maternal Grandfather        hands  . Breast cancer Paternal Grandmother   . Osteoporosis Paternal Grandmother   . Breast cancer Cousin   . Colon cancer Neg Hx      Prior to Admission medications   Medication Sig Start Date End Date Taking?  Authorizing Provider  acetaminophen (TYLENOL) 650 MG CR tablet Take 650-1,300 mg by mouth every 8 (eight) hours as needed for pain.     [provider]  amLODipine (NORVASC) 5 MG tablet Take 1 tablet (5 mg total) by mouth daily. 11/26/18   Reed, Tiffany L, DO  aspirin EC 81 MG tablet Take 81 mg by mouth every morning.     [provider]  atorvastatin (LIPITOR) 80 MG tablet TAKE 1 TABLET (80 MG TOTAL) BY MOUTH DAILY. 12/15/18   Reed, Tiffany L, DO  calcium citrate-vitamin D (CITRACAL+D) 315-200 MG-UNIT tablet Take 1 tablet by mouth 2 (two) times daily.    [provider]  cetirizine (ZYRTEC) 10 MG chewable tablet Chew 10 mg by mouth daily.    [provider]  Continuous Blood Gluc Receiver (FREESTYLE LIBRE 14 DAY READER) DEVI 1 application by Does not apply route 3 (three) times daily. 10/30/18  Reed, Tiffany L, DO  Continuous Blood Gluc Sensor (FREESTYLE LIBRE 14 DAY SENSOR) MISC 1 application by Does not apply route 3 (three) times daily. 10/30/18   Reed, Tiffany L, DO  empagliflozin (JARDIANCE) 10 MG TABS tablet Take 10 mg by mouth daily before supper. 12/11/18   Reed, Tiffany L, DO  fluticasone (FLONASE) 50 MCG/ACT nasal spray USE 2 SPRAYS IN EACH NOSTRIL EVERY DAY 12/15/18   Reed, Tiffany L, DO  furosemide (LASIX) 20 MG tablet TAKE 1 TABLET (20 MG TOTAL) BY MOUTH DAILY. 12/15/18   Reed, Tiffany L, DO  gabapentin (NEURONTIN) 100 MG capsule Take 100 mg by mouth See admin instructions. Taking 2 tabs in morning and 3 tabs at night    [provider]  lisinopril (ZESTRIL) 20 MG tablet Take 1 tablet (20 mg total) by mouth daily. 12/15/18   Reed, Tiffany L, DO  loperamide (IMODIUM A-D) 2 MG tablet Take 2 mg by mouth as needed for diarrhea or loose stools.    [provider]  metFORMIN (GLUCOPHAGE) 1000 MG tablet Take 1 tablet (1,000 mg total) by mouth 2 (two) times daily with a meal. 09/04/18   Reed, Tiffany L, DO  metoprolol tartrate (LOPRESSOR) 25 MG tablet  TAKE 1/2 TABLET TWO TIMES DAILY. 12/15/18   Reed, Tiffany L, DO  potassium chloride (K-DUR) 10 MEQ tablet TAKE 1 TABLET EVERY DAY 12/15/18   Reed, Tiffany L, DO  Probiotic Product (FORTIFY DAILY PROBIOTIC PO) Take by mouth.    [provider]  tobramycin (TOBREX) 0.3 % ophthalmic solution  07/08/18   [provider]  Vitamin D, Ergocalciferol, (DRISDOL) 50000 units CAPS capsule Take 1 capsule (50,000 Units total) by mouth every 7 (seven) days. 01/09/18   Reed, Tiffany L, DO  warfarin (COUMADIN) 6 MG tablet TAKE 1 TABLET EVERY DAY 06/05/18   Arnoldo Lenis, MD    Physical Exam: Vitals:   12/24/18 0230 12/24/18 0300 12/24/18 0456 12/24/18 0515  BP:      Pulse: 70 (!) 113    Resp: (!) 21 17 19  (!) 21  Temp:      TempSrc:      SpO2: 100% 100% 98% 96%  Weight:      Height:        Constitutional: NAD, calm, comfortable Eyes: PERRL, lids and conjunctivae normal ENMT: Mucous membranes are moist. Posterior pharynx clear of any exudate or lesions.Normal dentition.  Neck: normal, supple, no masses, no thyromegaly Respiratory: clear to auscultation bilaterally, no wheezing, no crackles. Normal respiratory effort. No accessory muscle use.  Cardiovascular: IRR, IRR, tachycardic Abdomen: no tenderness, no masses palpated. No hepatosplenomegaly. Bowel sounds positive.  Musculoskeletal: no clubbing / cyanosis. No joint deformity upper and lower extremities. Good ROM, no contractures. Normal muscle tone.  Skin: no rashes, lesions, ulcers. No induration Neurologic: CN 2-12 grossly intact. Sensation intact, DTR normal. Strength 5/5 in all 4.  Psychiatric: Anxious / depressed.   Labs on Admission: I have personally reviewed following labs and imaging studies  CBC: Recent Labs  Lab 12/23/18 1558  WBC 10.3  HGB 12.8  HCT 41.2  MCV 89.4  PLT [id number]*   Basic Metabolic Panel: Recent Labs  Lab 12/23/18 1558  NA 137  K 4.1  CL 106  CO2 22  GLUCOSE 147*  BUN 25*  CREATININE  0.92  CALCIUM 9.3   GFR: Estimated Creatinine Clearance: 46.2 mL/min (by C-G formula based on SCr of 0.92 mg/dL). Liver Function Tests: No results for input(s): AST, ALT,  ALKPHOS, BILITOT, PROT, ALBUMIN in the last 168 hours. No results for input(s): LIPASE, AMYLASE in the last 168 hours. No results for input(s): AMMONIA in the last 168 hours. Coagulation Profile: Recent Labs  Lab 12/23/18 1558  INR 2.0*   Cardiac Enzymes: No results for input(s): CKTOTAL, CKMB, CKMBINDEX, TROPONINI in the last 168 hours. BNP (last 3 results) No results for input(s): PROBNP in the last 8760 hours. HbA1C: No results for input(s): HGBA1C in the last 72 hours. CBG: No results for input(s): GLUCAP in the last 168 hours. Lipid Profile: No results for input(s): CHOL, HDL, LDLCALC, TRIG, CHOLHDL, LDLDIRECT in the last 72 hours. Thyroid Function Tests: No results for input(s): TSH, T4TOTAL, FREET4, T3FREE, THYROIDAB in the last 72 hours. Anemia Panel: No results for input(s): VITAMINB12, FOLATE, FERRITIN, TIBC, IRON, RETICCTPCT in the last 72 hours. Urine analysis:    Component Value Date/Time   COLORURINE YELLOW 12/23/2018 1704   APPEARANCEUR HAZY (A) 12/23/2018 1704   LABSPEC 1.017 12/23/2018 1704   PHURINE 5.0 12/23/2018 1704   GLUCOSEU >=500 (A) 12/23/2018 1704   HGBUR SMALL (A) 12/23/2018 1704   BILIRUBINUR NEGATIVE 12/23/2018 1704   KETONESUR NEGATIVE 12/23/2018 1704   PROTEINUR NEGATIVE 12/23/2018 1704   NITRITE POSITIVE (A) 12/23/2018 1704   LEUKOCYTESUR TRACE (A) 12/23/2018 1704    Radiological Exams on Admission: Mr Angio Head Wo Contrast  Result Date: 12/24/2018 CLINICAL DATA:  Persistent central vertigo EXAM: MRI HEAD WITHOUT CONTRAST MRA HEAD WITHOUT CONTRAST TECHNIQUE: Multiplanar, multiecho pulse sequences of the brain and surrounding structures were obtained without intravenous contrast. Angiographic images of the head were obtained using MRA technique without contrast.  COMPARISON:  05/02/2015 FINDINGS: MRI HEAD FINDINGS Brain: Punctate acute infarct along posterior right frontal cortex. Moderate size remote right parietal infarct affecting cortex and white matter. Remote lacunar infarct in the right corona radiata. Stable cerebral volume loss which is nonspecific. Mild ischemic gliosis mainly in the periventricular white matter. Coapted frontal horns of the lateral ventricles, incidental. No hemorrhage, hydrocephalus, collection, or masslike finding. Vascular: Normal flow voids Skull and upper cervical spine: Negative for marrow lesion Sinuses/Orbits: Negative MRA HEAD FINDINGS Mild blurring at superior slices is attributed to motion. No branch occlusion, flow limiting stenosis, beading, or aneurysm. There is mild medium vessel atherosclerotic irregularity, but limited by described artifact. IMPRESSION: Brain MRI: 1. Punctate acute infarct in the right frontal cortex. 2. The otherwise stable from 2017, including remote right-sided lacunar and parietal infarcts. MRA: 1. Motion degraded study with no emergent or interval finding. 2. Mild atheromatous changes of medium size vessels. Electronically Signed   By: Monte Fantasia M.D.   On: 12/24/2018 04:37   Mr Brain Wo Contrast  Result Date: 12/24/2018 CLINICAL DATA:  Persistent central vertigo EXAM: MRI HEAD WITHOUT CONTRAST MRA HEAD WITHOUT CONTRAST TECHNIQUE: Multiplanar, multiecho pulse sequences of the brain and surrounding structures were obtained without intravenous contrast. Angiographic images of the head were obtained using MRA technique without contrast. COMPARISON:  05/02/2015 FINDINGS: MRI HEAD FINDINGS Brain: Punctate acute infarct along posterior right frontal cortex. Moderate size remote right parietal infarct affecting cortex and white matter. Remote lacunar infarct in the right corona radiata. Stable cerebral volume loss which is nonspecific. Mild ischemic gliosis mainly in the periventricular white matter.  Coapted frontal horns of the lateral ventricles, incidental. No hemorrhage, hydrocephalus, collection, or masslike finding. Vascular: Normal flow voids Skull and upper cervical spine: Negative for marrow lesion Sinuses/Orbits: Negative MRA HEAD FINDINGS Mild blurring at superior slices is  attributed to motion. No branch occlusion, flow limiting stenosis, beading, or aneurysm. There is mild medium vessel atherosclerotic irregularity, but limited by described artifact. IMPRESSION: Brain MRI: 1. Punctate acute infarct in the right frontal cortex. 2. The otherwise stable from 2017, including remote right-sided lacunar and parietal infarcts. MRA: 1. Motion degraded study with no emergent or interval finding. 2. Mild atheromatous changes of medium size vessels. Electronically Signed   By: Monte Fantasia M.D.   On: 12/24/2018 04:37    EKG: Independently reviewed.  Assessment/Plan Principal Problem:   Acute ischemic stroke [facility]) Active Problems:   Essential hypertension   Type 2 diabetes mellitus with complication, without long-term current use of insulin (HCC)   Atrial fibrillation (HCC)   Hyperlipidemia associated with type 2 diabetes mellitus ([place])   COVID-19 virus infection    1. Dizziness - 1. Suspect due to the punctate acute ischemic stroke, with COVID being the incidental finding 2. Less likely an atypical symptom of COVID, with the punctate stroke being the incidental finding. 2. Acute ischemic stroke - 1. Neuro consulted 2. Stroke pathway and work up 3. 2d echo 4. Carotic dopplers 5. Continue coumadin 6. Tele monitor 7. PT/OT/SLP 3. COVID - 1. Seems asymptomatic at this point (other than dizziness which may be related to the stroke instead). 2. Certianly no O2 requirement, SOB, etc. 3. CXR pending 4. On isolation for the moment 5. CRP, D.Dimer, and procalcitonin pending. 6. But no indication for steroids, remdesivir, actemra, at this time. 7. Probably should isolate for the next  couple of weeks however to prevent spread of virus.  Tried to reassure patient that I didn't think she would need to "isolate for years or the rest of her life" as she put it. 4. HTN - 1. Holding home BP meds 5. A.fib - now with RVR 1. Cont metoprolol 2. Cardizem gtt for rate control due to RVR 3. Continue coumadin (INR 2.0 in ED) 6. DM2 - 1. Cont jardiance 2. Hold metformin 3. Mod scale SSI AC 7. HFpEF - 1. Cont home lasix 20mg  daily  DVT prophylaxis: Coumadin Code Status: Full Family Communication: No family in room Disposition Plan: Home after admit Consults called: Neuro Admission status: Place in 30    , [place] Hospitalists  How to contact the [facility] Attending or Consulting provider [place] or covering provider during after hours [place], for this patient?  1. Check the care team in [facility] and look for a) attending/consulting [facility] provider listed and b) the [facility] team listed 2. Log into [email or web address]  Amion Physician Scheduling and messaging for groups and whole hospitals  On call and physician scheduling software for group practices, residents, hospitalists and other medical providers for call, clinic, rotation and shift schedules. On[employer] is a hospital-wide system for scheduling doctors and paging doctors on call. EasyPlot is for scientific plotting and data analysis.  [email or web address]  and use [place]'s universal password to access. If you do not have the password, please contact the hospital operator.  3. Locate the [facility] provider you are looking for under [facility] and page to a number that you can be directly reached. 4. If you still have difficulty reaching the provider, please page the [facility] (Director on Call) for the Hospitalists listed on amion for assistance.  12/24/2018, 5:40 AM

## 2018-12-24 NOTE — Progress Notes (Addendum)
Daily Nursing Note  Stroke Education Completed: Including Signs and Symptoms, when to call 911, individualized risk factors, follow-up planning, and medications. Will need reinforcement.   Placed on dilt gtt in AM for HR in 120's, now remaining in 80's-90's, dilt gtt DC'd in afternoon.  TTE and Carotid duplex complete, awaiting results.  Patient mobilizing well around room.  HR remained persistently in 120's-130's in early evening diltiazem gtt restarted at 5mg /hr.  All patient needs me throughout the day.

## 2018-12-24 NOTE — Progress Notes (Signed)
Carotid duplex has been completed.   Preliminary results in CV Proc.   Abram Sander 12/24/2018 12:42 PM

## 2018-12-24 NOTE — Progress Notes (Addendum)
STROKE TEAM PROGRESS NOTE   INTERVAL HISTORY I have personally reviewed history of presenting illness with the patient, reviewed electronic medical records and imaging films in PACS.  She presented with recurrent episodes of transient dizziness and vertigo MRI scan of the brain shows a tiny right frontal cortical and subcortical infarct which is likely embolic from her atrial fibrillation.  She has been on long-term anticoagulation with warfarin and states she has been quite compliant with it.  Vitals:   12/24/18 0530 12/24/18 0615 12/24/18 0635 12/24/18 1252  BP: (!) 141/106 (!) 150/117 (!) 148/82 (!) 115/97  Pulse: (!) 120 75  83  Resp: 16  (!) 22 20  Temp:   97.7 F (36.5 C) 98.1 F (36.7 C)  TempSrc:   Oral Oral  SpO2: 98% 98% 99% 98%  Weight:   75.1 kg   Height:   5\' 2"  (1.575 m)     CBC:  Recent Labs  Lab 12/23/18 1558 12/24/18 0644  WBC 10.3 10.5  NEUTROABS  --  7.2  HGB 12.8 12.2  HCT 41.2 37.1  MCV 89.4 87.7  PLT 422* [id number]    Basic Metabolic Panel:  Recent Labs  Lab 12/23/18 1558 12/24/18 0644  NA 137 139  K 4.1 3.6  CL 106 107  CO2 22 20*  GLUCOSE 147* 220*  BUN 25* 18  CREATININE 0.92 0.97  CALCIUM 9.3 9.0   Lipid Panel:     Component Value Date/Time   CHOL 142 09/04/2018 0939   TRIG 183 (H) 09/04/2018 0939   HDL 40 (L) 09/04/2018 0939   CHOLHDL 3.6 09/04/2018 0939   VLDL 35 01/06/2018 0820   LDLCALC 74 09/04/2018 0939   HgbA1c:  Lab Results  Component Value Date   HGBA1C 7.7 (H) 12/11/2018   Urine Drug Screen:     Component Value Date/Time   LABOPIA NONE DETECTED 12/23/2018 1704   COCAINSCRNUR NONE DETECTED 12/23/2018 1704   LABBENZ NONE DETECTED 12/23/2018 1704   AMPHETMU NONE DETECTED 12/23/2018 1704   THCU NONE DETECTED 12/23/2018 1704   LABBARB NONE DETECTED 12/23/2018 1704    Alcohol Level No results found for: [facility]  IMAGING Mr Angio Head Wo Contrast  Result Date: 12/24/2018 CLINICAL DATA:  Persistent central vertigo EXAM: MRI  HEAD WITHOUT CONTRAST MRA HEAD WITHOUT CONTRAST TECHNIQUE: Multiplanar, multiecho pulse sequences of the brain and surrounding structures were obtained without intravenous contrast. Angiographic images of the head were obtained using MRA technique without contrast. COMPARISON:  05/02/2015 FINDINGS: MRI HEAD FINDINGS Brain: Punctate acute infarct along posterior right frontal cortex. Moderate size remote right parietal infarct affecting cortex and white matter. Remote lacunar infarct in the right corona radiata. Stable cerebral volume loss which is nonspecific. Mild ischemic gliosis mainly in the periventricular white matter. Coapted frontal horns of the lateral ventricles, incidental. No hemorrhage, hydrocephalus, collection, or masslike finding. Vascular: Normal flow voids Skull and upper cervical spine: Negative for marrow lesion Sinuses/Orbits: Negative MRA HEAD FINDINGS Mild blurring at superior slices is attributed to motion. No branch occlusion, flow limiting stenosis, beading, or aneurysm. There is mild medium vessel atherosclerotic irregularity, but limited by described artifact. IMPRESSION: Brain MRI: 1. Punctate acute infarct in the right frontal cortex. 2. The otherwise stable from 2017, including remote right-sided lacunar and parietal infarcts. MRA: 1. Motion degraded study with no emergent or interval finding. 2. Mild atheromatous changes of medium size vessels. Electronically Signed   By: Monte Fantasia M.D.   On: 12/24/2018 04:37   Mr  Brain Wo Contrast  Result Date: 12/24/2018 CLINICAL DATA:  Persistent central vertigo EXAM: MRI HEAD WITHOUT CONTRAST MRA HEAD WITHOUT CONTRAST TECHNIQUE: Multiplanar, multiecho pulse sequences of the brain and surrounding structures were obtained without intravenous contrast. Angiographic images of the head were obtained using MRA technique without contrast. COMPARISON:  05/02/2015 FINDINGS: MRI HEAD FINDINGS Brain: Punctate acute infarct along posterior right  frontal cortex. Moderate size remote right parietal infarct affecting cortex and white matter. Remote lacunar infarct in the right corona radiata. Stable cerebral volume loss which is nonspecific. Mild ischemic gliosis mainly in the periventricular white matter. Coapted frontal horns of the lateral ventricles, incidental. No hemorrhage, hydrocephalus, collection, or masslike finding. Vascular: Normal flow voids Skull and upper cervical spine: Negative for marrow lesion Sinuses/Orbits: Negative MRA HEAD FINDINGS Mild blurring at superior slices is attributed to motion. No branch occlusion, flow limiting stenosis, beading, or aneurysm. There is mild medium vessel atherosclerotic irregularity, but limited by described artifact. IMPRESSION: Brain MRI: 1. Punctate acute infarct in the right frontal cortex. 2. The otherwise stable from 2017, including remote right-sided lacunar and parietal infarcts. MRA: 1. Motion degraded study with no emergent or interval finding. 2. Mild atheromatous changes of medium size vessels. Electronically Signed   By: Monte Fantasia M.D.   On: 12/24/2018 04:37   Dg Chest Port 1 View  Result Date: 12/24/2018 CLINICAL DATA:  Atrial fibrillation EXAM: PORTABLE CHEST 1 VIEW COMPARISON:  06/18/2017 FINDINGS: Cardiomegaly. Prior median sternotomy for CABG, aortic valve replacement, and mitral valve replacement. History of COVID-19 positivity. Interstitial coarsening is stable. There is no edema, consolidation, effusion, or pneumothorax. Generalized osteopenia IMPRESSION: Stable exam.  No acute finding. Electronically Signed   By: Monte Fantasia M.D.   On: 12/24/2018 06:30   Vas US Carotid (at [place] Only)  Result Date: 12/24/2018 Carotid Arterial Duplex Study Indications:      CVA. Risk Factors:     Hypertension, hyperlipidemia, Diabetes, coronary artery                   disease. Comparison Study: no prior Performing Technologist: Abram Sander RVS  Examination Guidelines: A complete  evaluation includes B-mode imaging, spectral Doppler, color Doppler, and power Doppler as needed of all accessible portions of each vessel. Bilateral testing is considered an integral part of a complete examination. Limited examinations for reoccurring indications may be performed as noted.  Right Carotid Findings: +----------+--------+--------+--------+-------------------------+--------+           PSV cm/sEDV cm/sStenosisPlaque Description       Comments +----------+--------+--------+--------+-------------------------+--------+ CCA Prox  56      16              heterogenous                      +----------+--------+--------+--------+-------------------------+--------+ CCA Distal38      9               heterogenous                      +----------+--------+--------+--------+-------------------------+--------+ ICA Prox  68      16      1-39%   calcific and heterogenous         +----------+--------+--------+--------+-------------------------+--------+ ICA Distal68      20                                                +----------+--------+--------+--------+-------------------------+--------+  ECA       83                                                        +----------+--------+--------+--------+-------------------------+--------+ +----------+--------+-------+--------+-------------------+           PSV cm/sEDV cmsDescribeArm Pressure (mmHG) +----------+--------+-------+--------+-------------------+ [id number]                                        +----------+--------+-------+--------+-------------------+ +---------+--------+--+--------+--+---------+ VertebralPSV cm/s55EDV cm/s15Antegrade +---------+--------+--+--------+--+---------+  Left Carotid Findings: +----------+--------+--------+--------+------------------+--------+           PSV cm/sEDV cm/sStenosisPlaque DescriptionComments  +----------+--------+--------+--------+------------------+--------+ CCA Prox  47      11              heterogenous               +----------+--------+--------+--------+------------------+--------+ CCA Distal41      9               heterogenous               +----------+--------+--------+--------+------------------+--------+ ICA Prox  105     14      1-39%   heterogenous               +----------+--------+--------+--------+------------------+--------+ ICA Distal107     26                                         +----------+--------+--------+--------+------------------+--------+ ECA       155                                                +----------+--------+--------+--------+------------------+--------+ +----------+--------+--------+--------+-------------------+           PSV cm/sEDV cm/sDescribeArm Pressure (mmHG) +----------+--------+--------+--------+-------------------+ [id number]                                          +----------+--------+--------+--------+-------------------+ +---------+--------+--+--------+--+---------+ VertebralPSV cm/s45EDV cm/s14Antegrade +---------+--------+--+--------+--+---------+  Summary: Right Carotid: Velocities in the right ICA are consistent with a 1-39% stenosis. Left Carotid: Velocities in the left ICA are consistent with a 1-39% stenosis. Vertebrals: Bilateral vertebral arteries demonstrate antegrade flow. *See table(s) above for measurements and observations.     Preliminary    PHYSICAL EXAM Pleasant elderly Caucasian lady not in distress. . Afebrile. Head is nontraumatic. Neck is supple without bruit.    Cardiac exam no murmur or gallop. Lungs are clear to auscultation. Distal pulses are well felt. Neurological Exam ;  Awake  Alert oriented x 3. Normal speech and language.eye movements full without nystagmus.fundi were not visualized. Vision acuity and fields appear normal. Hearing is normal. Palatal movements are  normal. Face symmetric. Tongue midline. Normal strength, tone, reflexes and coordination. Normal sensation. Gait deferred.  ASSESSMENT/PLAN [name] is a 78 y.o. female with history of atrial fibrillation, congestive heart failure, mitral valve replacement, coronary artery disease presenting to [facility] with recurrent episodes of dizziness.  Stroke:   punctate R frontal cortex infarct embolic secondary to AF source on warfarin  MRI  Punctate R frontal cortex infarct. Old R side lacunes and parietal infarcts.  MRA  Mild atherosclerosis medium vessels  Carotid Doppler  B ICA 1-39% stenosis, VAs antegrade   2D Echo pending  LDL 74  HgbA1c 7.7  Warfarin for VTE prophylaxis  aspirin 81 mg daily and warfarin daily prior to admission, now on aspirin 81 mg daily and warfarin daily. Continue at d/c  Therapy recommendations:  pending   Disposition:  pending   Atrial Fibrillation  Home anticoagulation:  warfarin daily, continued in the hospital  INR on arrival 2.0, Last INR 1.6 . Given AF and bioprosthetic valve, recommend Continue warfarin daily at discharge  COVID Positive  asymptomatic   CXR NAD  Isolation recommended at d/c  Hypertension  Stable . Permissive hypertension (OK if < 220/120) but gradually normalize in 5-7 days . Long-term BP goal normotensive  Hyperlipidemia  Home meds:  lipitor 80, resumed in hospital  LDL 74, goal < 70  Continue statin at discharge  Diabetes type II Uncontrolled  HgbA1c 7.7, goal < 7.0  Other Stroke Risk Factors  Advanced age  Former Cigarette smoker, quit 71 yrs ago  Obesity, Body mass index is 30.28 kg/m., recommend weight loss, diet and exercise as appropriate   Hx TIA  Family hx stroke (mother, father, maternal grandmother)  Coronary artery disease s/p CABG  Migraines  HFpEF  Other Active Problems    Hospital day # 0  I have personally obtained history,examined this patient,  reviewed notes, independently viewed imaging studies, participated in medical decision making and plan of care.ROS completed by me personally and pertinent positives fully documented  I have made any additions or clarifications directly to the above note. . She presented with transient dizziness which may possibly be peripheral vestibular in origin but MRI scan does show a tiny right frontal infarct which may be silent but is related to atrial fibrillation despite being on optimal anticoagulation on warfarin with INR of 2.  I have discussed alternative medicines for anticoagulation with the patient but she is reluctant to consider switching to Eliquis or Pradaxa since she has valvular heart disease with a bio prosthetic heart valve.  Continue warfarin with INR goal between 2 and 3.  Check echocardiogram.  Physical occupational therapy consults.  Discussed with Dr. Doristine Bosworth.  Greater than 50% time during this 35-minute visit was spent on counseling and coordination of care about her stroke and atrial fibrillation and discussion about anticoagulation treatment options and answering questions  Antony Contras, [facility] Pager: [phone] 12/24/2018 4:22 PM  ADDENDUM :echocardiogram study is normal.  Continue warfarin with INR goal between 2 and 3.  Stroke team will sign off.  Kindly call for questions.  Discussed with Dr.Pahwani Antony Contras, MD To contact Stroke Continuity provider, please refer to [email or web address] After hours, contact General Neurology

## 2018-12-24 NOTE — Telephone Encounter (Signed)
Patient is currently in the hospital. RX will be refused. Patient will follow-up when discharged. RX request can be revisited at that time

## 2018-12-24 NOTE — ED Notes (Signed)
Report given to 2W RN. All questions answered 

## 2018-12-24 NOTE — Consult Note (Addendum)
Requesting Physician: Dr. Nada Boozer PA-C    Chief Complaint: Recurrent episodes of dizziness  History obtained from: Patient and Chart     HPI:                                                                                                                                       Jeanette Yates is a 78 y.o. female with past medical history significant for atrial fibrillation, congestive heart failure, mitral valve replacement, coronary artery disease presents to [facility] emergency department with recurrent episodes of dizziness.  Patient describes prior episode sudden onset, not provoked by position.  She describes the sensation as room spinning, not associated with tinnitus, double vision or slurred speech although on 1 occasion her daughter noticed her speech to have been slightly slurred.  Her first episode was about 1 week ago and then she had 2 episodes yesterday lasting about 30 minutes each.  She went to the emergency room and CT angiogram was planned to rule out posterior circulation stenosis/vertebrobasilar insufficiency.  However patient was allergic to contrast and she was sent to [facility], ER to obtain an MR angiogram.  While at [facility], ER patient was found to be in A. fib with RVR and MRI brain showed a punctate infarct in the right frontal lobe and neurology was consulted.  She was also noted to be found positive for COVID 19 despite not having any symptoms to suggest a viral illness.    Date last known well: 9.28-20 tPA Given: No, symptoms resolved NIHSS: 0 Baseline MRS 0   Past Medical History:  Diagnosis Date  . Adjustment disorder 10/17/2005  . Atrial fibrillation ([place])   . Cataract    bilateral  . CHF (congestive heart failure) ([place])   . Contracture of knee joint 07/26/2009  . Coronary artery disease   . Degeneration of lumbar or lumbosacral intervertebral disc 07/26/2009  . Diabetes mellitus without complication ([place])   . Esophageal spasm   .  Fibromyalgia   . Hearing loss 12/18/2010  . Heart murmur   . Hypercholesteremia   . Hypertension   . 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 Leaky heart valve   . Lyme disease   . Macular degeneration of both eyes   . Migraine 10/17/2005  . Mixed incontinence 10/17/2005  . Osteoarthritis    multiple joints   . Overweight 09/19/2010  . Pneumonia   . PONV (postoperative nausea and vomiting)   . Postartificial menopausal syndrome 10/10/2009  . Prinzmetal angina ([place]) 10/17/2005  . Psoriasis 06/12/2006  . Sciatica   . TIA (transient ischemic attack)   . Type II diabetes mellitus ([place])   . [name] 10/17/2005    Past Surgical History:  Procedure Laterality Date  . ABDOMINAL HYSTERECTOMY    . AORTIC VALVE REPLACEMENT  12/10/2013   porcine  . CATARACT EXTRACTION, BILATERAL    .  CHOLECYSTECTOMY  1981  . COLONOSCOPY  04/18/2006   Dr. Nelva Nay: internal hemorrhoids  . COLONOSCOPY  10/2003   Dr. Alphonsa Gin: hemorrhoids  . CORONARY ARTERY BYPASS GRAFT  2015  . ESOPHAGOGASTRODUODENOSCOPY  10/16/2013   Dr. Marla Roe: prior Nissen fundoplication intact, hypertonic LES, dilated up to 30 [place] with moderate resistance, gastritis but no H. pylori., Reactive gastritis, no celiac disease.  . ESOPHAGOGASTRODUODENOSCOPY  05/29/2012   Dr. Doy Mince: Moderately severe esophagitis, acute gastritis reactive, no H pylori, no celiac. Esophageal biopsies consistent with GERD, no Barrett  . ESOPHAGOGASTRODUODENOSCOPY  03/21/2009   Dr. Doy Mince: Reflux esophagitis, gastritis without H. pylori, esophagus stretched 57 savory  . ESOPHAGOGASTRODUODENOSCOPY  02/20/2008   Dr. Doy Mince: Esophagus dilated to 78 French, reactive gastropathy with no H pylori. No Barrett's on esophageal biopsy  . ESOPHAGOGASTRODUODENOSCOPY  09/24/2006   Dr. Doy Mince: Tight wrap noted, reactive gastropathy, no Barrett's  . ESOPHAGOGASTRODUODENOSCOPY (EGD) WITH PROPOFOL N/A 01/09/2017   Procedure:  ESOPHAGOGASTRODUODENOSCOPY (EGD) WITH PROPOFOL;  Surgeon: Daneil Dolin, MD;  Location: [facility] ENDO SUITE;  Service: Endoscopy;  Laterality: N/A;  2:15PM  . FRACTURE SURGERY Left    wrist  . JOINT REPLACEMENT    . MALONEY DILATION N/A 01/09/2017   Procedure: Venia Minks DILATION;  Surgeon: Daneil Dolin, MD;  Location: [facility] ENDO SUITE;  Service: Endoscopy;  Laterality: N/A;  . MITRAL VALVE REPAIR  11/2013  . NISSEN FUNDOPLICATION  [id number]  . REPLACEMENT TOTAL KNEE BILATERAL  2010/2013    Family History  Problem Relation Age of Onset  . Stroke Mother   . Heart failure Mother   . Heart disease Mother   . Mitral valve prolapse Mother   . Diabetes Father   . Heart attack Father   . Stroke Father   . Heart disease Father   . Hypertension Father   . Alzheimer's disease Father   . Stroke Maternal Grandmother   . Arthritis Maternal Grandfather        hands  . Breast cancer Paternal Grandmother   . Osteoporosis Paternal Grandmother   . Breast cancer Cousin   . Colon cancer Neg Hx    Social History:  reports that she quit smoking about 52 years ago. Her smoking use included cigarettes. She has a 20.00 pack-year smoking history. She has never used smokeless tobacco. She reports that she does not drink alcohol or use drugs.  Allergies:  Allergies  Allergen Reactions  . Penicillins Anaphylaxis and Other (See Comments)    Has patient had a PCN reaction causing immediate rash, facial/tongue/throat swelling, SOB or lightheadedness with hypotension: Yes Has patient had a PCN reaction causing severe rash involving mucus membranes or skin necrosis: Yes Has patient had a PCN reaction that required hospitalization Yes Has patient had a PCN reaction occurring within the last 10 years: No If all of the above answers are "NO", then may proceed with Cephalosporin use.   . Latex Other (See Comments)    Redness and rash  . Morphine And Related Nausea Only and Other (See Comments)    Dizziness  . Tape  Other (See Comments)    Redness and rash  . Iodine Other (See Comments)    Redness and rash    Medications:  I reviewed home medications   ROS:                                                                                                                                     14 systems reviewed and negative except above   Examination:                                                                                                      General: Appears well-developed  Psych: Affect appropriate to situation Eyes: No scleral injection HENT: No OP obstrucion Head: Normocephalic.  Cardiovascular: Normal rate and regular rhythm.  Respiratory: Effort normal and breath sounds normal to anterior ascultation GI: Soft.  No distension. There is no tenderness.  Skin: WDI    Neurological Examination Mental Status: Alert, oriented, thought content appropriate.  Speech fluent without evidence of aphasia. Able to follow 3 step commands without difficulty. Cranial Nerves: II: Visual fields grossly normal,  III,IV, VI: ptosis not present, extra-ocular motions intact bilaterally, pupils equal, round, reactive to light and accommodation V,VII: smile symmetric, facial light touch sensation normal bilaterally VIII: hearing normal bilaterally IX,X: uvula rises symmetrically XI: bilateral shoulder shrug XII: midline tongue extension Motor: Right : Upper extremity   5/5    Left:     Upper extremity   5/5  Lower extremity   5/5     Lower extremity   5/5 Tone and bulk:normal tone throughout; no atrophy noted Sensory: Pinprick and light touch intact throughout, bilaterally Plantars: Right: downgoing   Left: downgoing Cerebellar: normal finger-to-nose, normal rapid alternating movements and normal heel-to-shin test Gait: normal gait and station     Lab Results: Basic Metabolic  Panel: Recent Labs  Lab 12/23/18 1558  NA 137  K 4.1  CL 106  CO2 22  GLUCOSE 147*  BUN 25*  CREATININE 0.92  CALCIUM 9.3    CBC: Recent Labs  Lab 12/23/18 1558  WBC 10.3  HGB 12.8  HCT 41.2  MCV 89.4  PLT 422*    Coagulation Studies: Recent Labs    12/23/18 1558  LABPROT 22.6*  INR 2.0*    Imaging: Mr Angio Head Wo Contrast  Result Date: 12/24/2018 CLINICAL DATA:  Persistent central vertigo EXAM: MRI HEAD WITHOUT CONTRAST MRA HEAD WITHOUT CONTRAST TECHNIQUE: Multiplanar, multiecho pulse sequences of the brain and surrounding structures were obtained without intravenous contrast. Angiographic images of the head were obtained using MRA technique without contrast. COMPARISON:  05/02/2015 FINDINGS: MRI HEAD FINDINGS Brain: Punctate acute  infarct along posterior right frontal cortex. Moderate size remote right parietal infarct affecting cortex and white matter. Remote lacunar infarct in the right corona radiata. Stable cerebral volume loss which is nonspecific. Mild ischemic gliosis mainly in the periventricular white matter. Coapted frontal horns of the lateral ventricles, incidental. No hemorrhage, hydrocephalus, collection, or masslike finding. Vascular: Normal flow voids Skull and upper cervical spine: Negative for marrow lesion Sinuses/Orbits: Negative MRA HEAD FINDINGS Mild blurring at superior slices is attributed to motion. No branch occlusion, flow limiting stenosis, beading, or aneurysm. There is mild medium vessel atherosclerotic irregularity, but limited by described artifact. IMPRESSION: Brain MRI: 1. Punctate acute infarct in the right frontal cortex. 2. The otherwise stable from 2017, including remote right-sided lacunar and parietal infarcts. MRA: 1. Motion degraded study with no emergent or interval finding. 2. Mild atheromatous changes of medium size vessels. Electronically Signed   By: Monte Fantasia M.D.   On: 12/24/2018 04:37   Mr Brain Wo Contrast  Result  Date: 12/24/2018 CLINICAL DATA:  Persistent central vertigo EXAM: MRI HEAD WITHOUT CONTRAST MRA HEAD WITHOUT CONTRAST TECHNIQUE: Multiplanar, multiecho pulse sequences of the brain and surrounding structures were obtained without intravenous contrast. Angiographic images of the head were obtained using MRA technique without contrast. COMPARISON:  05/02/2015 FINDINGS: MRI HEAD FINDINGS Brain: Punctate acute infarct along posterior right frontal cortex. Moderate size remote right parietal infarct affecting cortex and white matter. Remote lacunar infarct in the right corona radiata. Stable cerebral volume loss which is nonspecific. Mild ischemic gliosis mainly in the periventricular white matter. Coapted frontal horns of the lateral ventricles, incidental. No hemorrhage, hydrocephalus, collection, or masslike finding. Vascular: Normal flow voids Skull and upper cervical spine: Negative for marrow lesion Sinuses/Orbits: Negative MRA HEAD FINDINGS Mild blurring at superior slices is attributed to motion. No branch occlusion, flow limiting stenosis, beading, or aneurysm. There is mild medium vessel atherosclerotic irregularity, but limited by described artifact. IMPRESSION: Brain MRI: 1. Punctate acute infarct in the right frontal cortex. 2. The otherwise stable from 2017, including remote right-sided lacunar and parietal infarcts. MRA: 1. Motion degraded study with no emergent or interval finding. 2. Mild atheromatous changes of medium size vessels. Electronically Signed   By: Monte Fantasia M.D.   On: 12/24/2018 04:37   Dg Chest Port 1 View  Result Date: 12/24/2018 CLINICAL DATA:  Atrial fibrillation EXAM: PORTABLE CHEST 1 VIEW COMPARISON:  06/18/2017 FINDINGS: Cardiomegaly. Prior median sternotomy for CABG, aortic valve replacement, and mitral valve replacement. History of COVID-19 positivity. Interstitial coarsening is stable. There is no edema, consolidation, effusion, or pneumothorax. Generalized osteopenia  IMPRESSION: Stable exam.  No acute finding. Electronically Signed   By: Monte Fantasia M.D.   On: 12/24/2018 06:30     ASSESSMENT AND PLAN   75y female with Afib on Coumadin presents with recurrent episodes of dizziness. Found to have acute Right frontal lobe stroke on MRI Brain. Patient is compliant on Coumadin and INR was 2.0 with one reading on less than 1.7 on 6/23.  Finding on MRI brain likely incidental as this would not explain recurrent episodes of vertigo.   Peripheral vertigo Right frontal lobe infarct A. fib with RVR  Recommendations -Continue Coumadin -Echocardiogram to evaluate for vegetation -Carotid and vertebral Dopplers -Swallow evaluation -A1c and lipid profile  Sushanth Aroor [facility] Pager Number [phone]

## 2018-12-24 NOTE — ED Notes (Signed)
Breakfast Ordered 

## 2018-12-24 NOTE — ED Notes (Signed)
Pt in afib, HR ranging between 110-150, admitting MD at bedside and aware

## 2018-12-24 NOTE — Progress Notes (Signed)
Patient seen and examined briefly.  She was admitted this morning due to vertigo and was diagnosed with acute ischemic infarct.  Patient has no complaint.  Vertigo improved.  No focal neurological deficit.  No dyspnea or chest pain.  Patient is anxious and concerned about her new diagnosis of COVID-19.  She asked several questions which she is supposed to do to prevent this from being spread to her family.  I answered all her questions to the best of my ability and to the best of her satisfaction.  She was evaluated by neurology.  Further work-up for stroke is pending.

## 2018-12-24 NOTE — ED Notes (Signed)
Pt returned from MRI °

## 2018-12-24 NOTE — Progress Notes (Signed)
  Echocardiogram 2D Echocardiogram has been performed.  Matilde Bash 12/24/2018, 1:08 PM

## 2018-12-24 NOTE — ED Notes (Signed)
Transported pt to MRI with transporter

## 2018-12-24 NOTE — Plan of Care (Signed)
  Problem: Education: Goal: Knowledge of disease or condition will improve Outcome: Progressing Goal: Knowledge of secondary prevention will improve Outcome: Progressing   Problem: Self-Care: Goal: Verbalization of feelings and concerns over difficulty with self-care will improve Outcome: Progressing Goal: Ability to communicate needs accurately will improve Outcome: Progressing

## 2018-12-24 NOTE — Evaluation (Signed)
Physical Therapy Evaluation Patient Details Name: Jeanette Yates MRN: [id number] DOB: [birth year] Today's Date: 12/24/2018   History of Present Illness  78 yo female admitted to ED on 9/29 with lightheadedness, dizziness. CT reveals R frontal lobe acute punctate ischemic CVA, with seemingly no residual deficits per neurologist. Pt also with 36-month history of room-spinning vertigo with no particular pattern of exacerbating factors. PMH includes HLD, HTN, DM, TIA, afib, vertigo, CAD s/p CABG, HF, macular degeneration, adjustment disorder, fibromyalgia, osteopenia.  Clinical Impression   Pt presents with vertigo with positional changes, unsteadiness with challenges to gait, and decreased activity tolerance. Pt to benefit from acute PT to address deficits. Pt ambulated 38 ft with no AD and mod I, and performed all mobility tasks with mod I. Pt's main complaint at this time is room-spinning dizziness over the past 2 months. Pt was symptomatic with L dix-hallpike maneuver when sitting up, and after first L epley maneuver. Pt with improved symptoms after second epley maneuver. Pt not symptomatic with smooth pursuits, saccades, and did not report lightheadedness with positional changes which may have suggested orthostatics. Pt also reported strange hallucinations in L dix-hallpike position, stating she saw a pair of astronauts, a vase of flowers, etc. Unsure of the reason for these. PT recommending pt to follow up with vestibular rehab, referral handout administered to pt. PT to see pt tomorrow if not d/c to continue to follow for vestibular. PT to progress mobility as tolerated, and will continue to follow acutely.      Follow Up Recommendations Outpatient PT(Neuro rehab for vestibular)    Equipment Recommendations  None recommended by PT    Recommendations for Other Services       Precautions / Restrictions Precautions Precautions: None Restrictions Weight Bearing Restrictions: No       Mobility  Bed Mobility Overal bed mobility: Modified Independent             General bed mobility comments: increased time with use of bed rails to come to sitting.  Transfers Overall transfer level: Modified independent               General transfer comment: pt with self-steadying upon standing, no imbalance noted.  Ambulation/Gait Ambulation/Gait assistance: Modified independent (Device/Increase time) Gait Distance (Feet): 80 Feet Assistive device: None Gait Pattern/deviations: Step-through pattern Gait velocity: slightly decr   General Gait Details: Increased time for ambulation, no evidence of imbalance in standing even with head turning.  Stairs            Wheelchair Mobility    Modified Rankin (Stroke Patients Only)       Balance Overall balance assessment: Mild deficits observed, not formally tested                           High level balance activites: Head turns High Level Balance Comments: horizontal and vertical head turning WFL, pt feeling unsteady with vertical head turning but no imbalance noted. Pt steady and WFL with 180* turn.             Pertinent Vitals/Pain Pain Assessment: No/denies pain Pain Intervention(s): Limited activity within patient's tolerance;Monitored during session    [place] expects to be discharged to:: Private residence Living Arrangements: Children Available Help at Discharge: Family;Available 24 hours/day(daughter) Type of Home: House Home Access: Stairs to enter   Entrance Stairs-Number of Steps: 1+1 Home Layout: One level Home Equipment: Cane - single point  Prior Function Level of Independence: Independent with assistive device(s)         Comments: pt uses cane for ambulation as needed     Hand Dominance   Dominant Hand: Right    Extremity/Trunk Assessment   Upper Extremity Assessment Upper Extremity Assessment: Overall WFL for tasks assessed     Lower Extremity Assessment Lower Extremity Assessment: Overall WFL for tasks assessed    Cervical / Trunk Assessment Cervical / Trunk Assessment: Normal  Communication   Communication: No difficulties  Cognition Arousal/Alertness: Awake/alert Behavior During Therapy: WFL for tasks assessed/performed Overall Cognitive Status: Within Functional Limits for tasks assessed                                        General Comments General comments (skin integrity, edema, etc.): Dix hallpike - bilaterally, but + for nausea, room-spinning vertigo when sitting up from L dix hallpike. Smooth pursuits, saccades horizontal and vertical WFL.    Exercises Other Exercises Other Exercises: L epley maneuver x2 due to + L dix-hallpike when moving from supine to sit. Pt with no symptoms following second epley maneuver.   Assessment/Plan    PT Assessment Patient needs continued PT services  PT Problem List Decreased mobility;Cardiopulmonary status limiting activity;Decreased balance;Decreased activity tolerance       PT Treatment Interventions Therapeutic activities;Balance training;Neuromuscular re-education;Functional mobility training    PT Goals (Current goals can be found in the Care Plan section)  Acute Rehab PT Goals Patient Stated Goal: stop dizziness attacks PT Goal Formulation: With patient Time For Goal Achievement: 01/07/19 Potential to Achieve Goals: Good    Frequency Min 3X/week   Barriers to discharge        Co-evaluation               AM-PAC PT "6 Clicks" Mobility  Outcome Measure Help needed turning from your back to your side while in a flat bed without using bedrails?: None Help needed moving from lying on your back to sitting on the side of a flat bed without using bedrails?: None Help needed moving to and from a bed to a chair (including a wheelchair)?: None Help needed standing up from a chair using your arms (e.g., wheelchair or bedside  chair)?: None Help needed to walk in hospital room?: None Help needed climbing 3-5 steps with a railing? : A Little 6 Click Score: 23    End of Session   Activity Tolerance: Patient tolerated treatment well;Patient limited by fatigue Patient left: in bed;with call bell/phone within reach Nurse Communication: Mobility status PT Visit Diagnosis: Dizziness and giddiness (R42);BPPV;Unsteadiness on feet (R26.81) BPPV - Right/Left : Left    Time: [id number] PT Time Calculation (min) (ACUTE ONLY): 34 min   Charges:   PT Evaluation $PT Eval Low Complexity: 1 Low PT Treatments $[place] Rep Proc: 8-22 mins       Julien Girt, PT Acute Rehabilitation Services Pager [phone]  Office [phone]   Donnavin Vandenbrink D Trejuan Matherne 12/24/2018, 2:24 PM

## 2018-12-24 NOTE — Progress Notes (Signed)
ANTICOAGULATION CONSULT NOTE - Initial Consult  Pharmacy Consult for Coumadin Indication: atrial fibrillation  Allergies  Allergen Reactions  . Penicillins Anaphylaxis and Other (See Comments)    Has patient had a PCN reaction causing immediate rash, facial/tongue/throat swelling, SOB or lightheadedness with hypotension: Yes Has patient had a PCN reaction causing severe rash involving mucus membranes or skin necrosis: Yes Has patient had a PCN reaction that required hospitalization Yes Has patient had a PCN reaction occurring within the last 10 years: No If all of the above answers are "NO", then may proceed with Cephalosporin use.   . Latex Other (See Comments)    Redness and rash  . Morphine And Related Nausea Only and Other (See Comments)    Dizziness  . Tape Other (See Comments)    Redness and rash  . Iodine Other (See Comments)    Redness and rash    Patient Measurements: Height: 5\' 2"  (157.5 cm) Weight: 165 lb 9.1 oz (75.1 kg) IBW/kg (Calculated) : 50.1  Vital Signs: Temp: 97.7 F (36.5 C) (09/30 0635) Temp Source: Oral (09/30 0635) BP: 148/82 (09/30 0635) Pulse Rate: 75 (09/30 0615)  Labs: Recent Labs    12/23/18 1558 12/23/18 1730 12/24/18 0644  HGB 12.8  --   --   HCT 41.2  --   --   PLT 422*  --   --   APTT 37*  --   --   LABPROT 22.6*  --  18.9*  INR 2.0*  --  1.6*  CREATININE 0.92  --   --   TROPONINIHS 10 12  --     Estimated Creatinine Clearance: 49.4 mL/min (by C-G formula based on SCr of 0.92 mg/dL).   Medical History: Past Medical History:  Diagnosis Date  . Adjustment disorder 10/17/2005  . Atrial fibrillation ([place])   . Cataract    bilateral  . CHF (congestive heart failure) ([place])   . Contracture of knee joint 07/26/2009  . Coronary artery disease   . Degeneration of lumbar or lumbosacral intervertebral disc 07/26/2009  . Diabetes mellitus without complication ([place])   . Esophageal spasm   . Fibromyalgia   . Hearing loss 12/18/2010   . Heart murmur   . Hypercholesteremia   . Hypertension   . 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 Leaky heart valve   . Lyme disease   . Macular degeneration of both eyes   . Migraine 10/17/2005  . Mixed incontinence 10/17/2005  . Osteoarthritis    multiple joints   . Overweight 09/19/2010  . Pneumonia   . PONV (postoperative nausea and vomiting)   . Postartificial menopausal syndrome 10/10/2009  . Prinzmetal angina ([place]) 10/17/2005  . Psoriasis 06/12/2006  . Sciatica   . TIA (transient ischemic attack)   . Type II diabetes mellitus ([place])   . Urolith 10/17/2005    Medications:  No current facility-administered medications on file prior to encounter.    Current Outpatient Medications on File Prior to Encounter  Medication Sig Dispense Refill  . acetaminophen (TYLENOL) 650 MG CR tablet Take 650-1,300 mg by mouth every 8 (eight) hours as needed for pain.     Marland Kitchen amLODipine (NORVASC) 5 MG tablet Take 1 tablet (5 mg total) by mouth daily. 90 tablet 1  . aspirin EC 81 MG tablet Take 81 mg by mouth every morning.     Marland Kitchen atorvastatin (LIPITOR) 80 MG tablet TAKE 1 TABLET (80 MG TOTAL) BY MOUTH DAILY. 90 tablet 1  .  calcium citrate-vitamin D (CITRACAL+D) 315-200 MG-UNIT tablet Take 1 tablet by mouth 2 (two) times daily.    . cetirizine (ZYRTEC) 10 MG chewable tablet Chew 10 mg by mouth daily.    . Continuous Blood Gluc Receiver (FREESTYLE LIBRE 14 DAY READER) DEVI 1 application by Does not apply route 3 (three) times daily. 1 Device 5  . Continuous Blood Gluc Sensor (FREESTYLE LIBRE 14 DAY SENSOR) MISC 1 application by Does not apply route 3 (three) times daily. 1 each 5  . empagliflozin (JARDIANCE) 10 MG TABS tablet Take 10 mg by mouth daily before supper. 30 tablet 0  . fluticasone (FLONASE) 50 MCG/ACT nasal spray USE 2 SPRAYS IN EACH NOSTRIL EVERY DAY 48 g 0  . furosemide (LASIX) 20 MG tablet TAKE 1 TABLET (20 MG TOTAL) BY MOUTH DAILY. 90 tablet 1  . gabapentin  (NEURONTIN) 100 MG capsule Take 100 mg by mouth See admin instructions. Taking 2 tabs in morning and 3 tabs at night    . lisinopril (ZESTRIL) 20 MG tablet Take 1 tablet (20 mg total) by mouth daily. 90 tablet 1  . loperamide (IMODIUM A-D) 2 MG tablet Take 2 mg by mouth as needed for diarrhea or loose stools.    . metFORMIN (GLUCOPHAGE) 1000 MG tablet Take 1 tablet (1,000 mg total) by mouth 2 (two) times daily with a meal. 180 tablet 3  . metoprolol tartrate (LOPRESSOR) 25 MG tablet TAKE 1/2 TABLET TWO TIMES DAILY. 90 tablet 0  . potassium chloride (K-DUR) 10 MEQ tablet TAKE 1 TABLET EVERY DAY 90 tablet 1  . Probiotic Product (FORTIFY DAILY PROBIOTIC PO) Take by mouth.    . tobramycin (TOBREX) 0.3 % ophthalmic solution     . Vitamin D, Ergocalciferol, (DRISDOL) 50000 units CAPS capsule Take 1 capsule (50,000 Units total) by mouth every 7 (seven) days. 12 capsule 3  . warfarin (COUMADIN) 6 MG tablet TAKE 1 TABLET EVERY DAY 90 tablet 3     Assessment: 78 y.o. female admitted with CVA, h/o Afib, to continue Coumadin Goal of Therapy:  INR 2-3 Monitor platelets by anticoagulation protocol: Yes   Plan:  Coumadin 9 mg today Daily INR  Kristianne Albin, [place] 12/24/2018,7:48 AM

## 2018-12-25 DIAGNOSIS — I447 Left bundle-branch block, unspecified: Secondary | ICD-10-CM

## 2018-12-25 DIAGNOSIS — E785 Hyperlipidemia, unspecified: Secondary | ICD-10-CM

## 2018-12-25 DIAGNOSIS — Z9889 Other specified postprocedural states: Secondary | ICD-10-CM

## 2018-12-25 DIAGNOSIS — Z953 Presence of xenogenic heart valve: Secondary | ICD-10-CM

## 2018-12-25 DIAGNOSIS — I5022 Chronic systolic (congestive) heart failure: Secondary | ICD-10-CM

## 2018-12-25 LAB — LIPID PANEL
Cholesterol: 120 mg/dL (ref 0–200)
HDL: 39 mg/dL — ABNORMAL LOW (ref >40)
LDL Cholesterol: 57 mg/dL (ref 0–99)
Total CHOL/HDL Ratio: 3.1 RATIO
Triglycerides: 120 mg/dL (ref <150)
VLDL: 24 mg/dL (ref 0–40)

## 2018-12-25 LAB — HEMOGLOBIN A1C
Hgb A1c MFr Bld: 7.5 % — ABNORMAL HIGH (ref 4.8–5.6)
Mean Plasma Glucose: 168.55 mg/dL

## 2018-12-25 LAB — GLUCOSE, CAPILLARY
Glucose-Capillary: 108 mg/dL — ABNORMAL HIGH (ref 70–99)
Glucose-Capillary: 182 mg/dL — ABNORMAL HIGH (ref 70–99)
Glucose-Capillary: 149 mg/dL — ABNORMAL HIGH (ref 70–99)
Glucose-Capillary: 241 mg/dL — ABNORMAL HIGH (ref 70–99)

## 2018-12-25 LAB — URINE CULTURE

## 2018-12-25 LAB — PROTIME-INR
INR: 1.9 — ABNORMAL HIGH (ref 0.8–1.2)
Prothrombin Time: 21.6 seconds — ABNORMAL HIGH (ref 11.4–15.2)

## 2018-12-25 MED ORDER — WARFARIN SODIUM 3 MG PO TABS
6.0000 mg | ORAL_TABLET | Freq: Once | ORAL | Status: AC
  Administered 2018-12-25: 6 mg via ORAL
  Filled 2018-12-25: qty 2

## 2018-12-25 NOTE — TOC Initial Note (Signed)
Transition of Care [facility]) - Initial/Assessment Note    Patient Details  Name: Jeanette Yates MRN: [id number] Date of Birth: Aug 01, 1940  Transition of Care [facility]) CM/SW Contact:    Jeanette Ochs, LCSW Phone Number: 12/25/2018, 3:19 PM  Clinical Narrative:   CSW attempted to call patient's room; phone was busy multiple times. CSW called the patient's cell phone number listed in the chart, got patient's daughter, Jeanette Yates. Jeanette Yates said that patient has short term memory loss and would direct CSW to call her anyway, that she should be the point of contact for discussing discharge. CSW discussed with Jeanette Yates how patient was recommended for vestibular outpatient, but the patient will not be able to attend due to COVID+, but Jeanette Yates can provide vestibular PT with home health. Jeanette Yates is agreeable. No equipment recommendations, and patient does not use any equipment at home. Patient lives with Jeanette Yates in a garage apartment and she and her husband are available 24/7 if need be for the patient. Patient would prefer to follow-up with her own neurologist, Jeanette Yates at discharge.                 Expected Discharge Plan: [place] Barriers to Discharge: Continued Medical Work up   Patient Goals and CMS Choice   CMS Medicare.gov Compare Post Acute Care list provided to:: Patient Represenative (must comment) Choice offered to / list presented to : Adult Children  Expected Discharge Plan and Services Expected Discharge Plan: [place] Choice: [place] arrangements for the past 2 months: Single Family Home                           HH Arranged: PT HH Agency: [place] Date [place]: 12/25/18 Time HH Agency Contacted: [id number] Representative spoke with at [place]: Jeanette Yates  Prior Living Arrangements/Services Living arrangements for the past 2 months: [place] Lives with:: Adult Children Patient  language and need for interpreter reviewed:: No Do you feel safe going back to the place where you live?: Yes      Need for Family Participation in Patient Care: Yes (Comment) Care giver support system in place?: Yes (comment)   Criminal Activity/Legal Involvement Pertinent to Current Situation/Hospitalization: No - Comment as needed  Activities of Daily Living Home Assistive Devices/Equipment: Cane (specify quad or straight) ADL Screening (condition at time of admission) Patient's cognitive ability adequate to safely complete daily activities?: Yes Is the patient deaf or have difficulty hearing?: No Does the patient have difficulty seeing, even when wearing glasses/contacts?: Yes Does the patient have difficulty concentrating, remembering, or making decisions?: No Patient able to express need for assistance with ADLs?: Yes Does the patient have difficulty dressing or bathing?: No Independently performs ADLs?: Yes (appropriate for developmental age) Does the patient have difficulty walking or climbing stairs?: No Weakness of Legs: None Weakness of Arms/Hands: None  Permission Sought/Granted Permission sought to share information with : Family Supports Permission granted to share information with : Yes, Verbal Permission Granted  Share Information with NAME: Jeanette Yates     Permission granted to share info w Relationship: Daughter     Emotional Assessment       Orientation: : Oriented to Self, Oriented to Place, Oriented to  Time, Oriented to Situation Alcohol / Substance Use: Not Applicable Psych Involvement: No (comment)  Admission diagnosis:  Dizziness [R42] Stroke (  [place]) [I63.9] Cerebrovascular accident (CVA) due to other mechanism ([place]) [I63.89] COVID-19 virus detected [U07.1] COVID-19 [U07.1] Acute ischemic stroke [facility]) [I63.9] Patient Active Problem List   Diagnosis Date Noted  . COVID-19 virus infection 12/24/2018  . Acute ischemic stroke ([place]) 12/24/2018  . Stroke  ([place]) 12/24/2018  . Grief reaction with prolonged bereavement 04/24/2018  . Senile osteopenia 04/24/2018  . Injury of right wrist 04/24/2018  . Plantar fasciitis, bilateral 04/24/2018  . Irritable bowel syndrome with both constipation and diarrhea 04/24/2018  . Lactose intolerance 04/24/2018  . Influenza 04/24/2018  . Body mass index (BMI) of 30.0-30.9 in adult 09/05/2017  . Class 1 obesity due to excess calories with serious comorbidity and body mass index (BMI) of 30.0 to 30.9 in adult 09/05/2017  . Seasonal allergies 09/05/2017  . Hoarseness 09/05/2017  . Hyperlipidemia associated with type 2 diabetes mellitus ([place]) 09/05/2017  . Balance problem 02/11/2017  . Dysphagia 11/15/2016  . S/P AVR (aortic valve replacement) 04/13/2016  . Encounter for therapeutic drug monitoring 01/02/2016  . Hx of TIA (transient ischemic attack) and stroke 12/06/2015  . Type 2 diabetes mellitus with complication, without long-term current use of insulin ([place])   . Osteoarthritis   . Fibromyalgia   . Atrial fibrillation ([place])   . CHF (congestive heart failure) ([place])   . Esophageal spasm   . Numbness on right side 05/03/2015  . TIA (transient ischemic attack) 05/02/2015  . Essential hypertension 05/02/2015   PCP:  Jeanette Curry, DO Pharmacy:   [place] [place] [address] Phone: [phone] Fax: [phone]  [place] [address], [place] [address] [place] [address] [place] [address] [place] [address] Phone: [phone] Fax: [phone]     Social Determinants of Health (SDOH) Interventions    Readmission Risk Interventions No flowsheet data found.

## 2018-12-25 NOTE — Progress Notes (Signed)
[place] for Coumadin Indication: atrial fibrillation  Allergies  Allergen Reactions  . Penicillins Anaphylaxis and Other (See Comments)    Has patient had a PCN reaction causing immediate rash, facial/tongue/throat swelling, SOB or lightheadedness with hypotension: Yes Has patient had a PCN reaction causing severe rash involving mucus membranes or skin necrosis: Yes Has patient had a PCN reaction that required hospitalization Yes Has patient had a PCN reaction occurring within the last 10 years: No If all of the above answers are "NO", then may proceed with Cephalosporin use.   . Latex Other (See Comments)    Redness and rash  . Morphine And Related Nausea Only and Other (See Comments)    Dizziness  . Tape Other (See Comments)    Redness and rash  . Iodine Other (See Comments)    Redness and rash    Patient Measurements: Height: 5\' 2"  (157.5 cm) Weight: 165 lb 9.1 oz (75.1 kg) IBW/kg (Calculated) : 50.1  Vital Signs: Temp: 97.7 F (36.5 C) (10/01 0900) Temp Source: Oral (10/01 0900) BP: 143/116 (10/01 1042) Pulse Rate: 104 (10/01 1042)  Labs: Recent Labs    12/23/18 1558 12/23/18 1730 12/24/18 0644 12/25/18 0504  HGB 12.8  --  12.2  --   HCT 41.2  --  37.1  --   PLT 422*  --  375  --   APTT 37*  --   --   --   LABPROT 22.6*  --  18.9* 21.6*  INR 2.0*  --  1.6* 1.9*  CREATININE 0.92  --  0.97  --   TROPONINIHS 10 12  --   --     Estimated Creatinine Clearance: 46.8 mL/min (by C-G formula based on SCr of 0.97 mg/dL).   Medical History: Past Medical History:  Diagnosis Date  . Adjustment disorder 10/17/2005  . Atrial fibrillation ([place])   . Cataract    bilateral  . CHF (congestive heart failure) ([place])   . Contracture of knee joint 07/26/2009  . Coronary artery disease   . Degeneration of lumbar or lumbosacral intervertebral disc 07/26/2009  . Diabetes mellitus without complication ([place])   . Esophageal spasm   .  Fibromyalgia   . Hearing loss 12/18/2010  . Heart murmur   . Hypercholesteremia   . Hypertension   . 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 Leaky heart valve   . Lyme disease   . Macular degeneration of both eyes   . Migraine 10/17/2005  . Mixed incontinence 10/17/2005  . Osteoarthritis    multiple joints   . Overweight 09/19/2010  . Pneumonia   . PONV (postoperative nausea and vomiting)   . Postartificial menopausal syndrome 10/10/2009  . Prinzmetal angina ([place]) 10/17/2005  . Psoriasis 06/12/2006  . Sciatica   . TIA (transient ischemic attack)   . Type II diabetes mellitus ([place])   . Urolith 10/17/2005    Medications:  No current facility-administered medications on file prior to encounter.    Current Outpatient Medications on File Prior to Encounter  Medication Sig Dispense Refill  . acetaminophen (TYLENOL) 650 MG CR tablet Take 650-1,300 mg by mouth every 8 (eight) hours as needed for pain.     Marland Kitchen amLODipine (NORVASC) 5 MG tablet Take 1 tablet (5 mg total) by mouth daily. (Patient taking differently: Take 5 mg by mouth at bedtime. ) 90 tablet 1  . aspirin EC 81 MG tablet Take 81 mg by mouth every morning.     Marland Kitchen  atorvastatin (LIPITOR) 80 MG tablet TAKE 1 TABLET (80 MG TOTAL) BY MOUTH DAILY. 90 tablet 1  . calcium citrate-vitamin D (CITRACAL+D) 315-200 MG-UNIT tablet Take 1 tablet by mouth 2 (two) times daily.    . cetirizine (ZYRTEC) 10 MG chewable tablet Chew 10 mg by mouth daily.    . empagliflozin (JARDIANCE) 10 MG TABS tablet Take 10 mg by mouth daily before supper. (Patient taking differently: Take 10 mg by mouth daily. ) 30 tablet 0  . fluticasone (FLONASE) 50 MCG/ACT nasal spray USE 2 SPRAYS IN EACH NOSTRIL EVERY DAY (Patient taking differently: Place 2 sprays into both nostrils daily. ) 48 g 0  . furosemide (LASIX) 20 MG tablet TAKE 1 TABLET (20 MG TOTAL) BY MOUTH DAILY. 90 tablet 1  . gabapentin (NEURONTIN) 100 MG capsule Take 100 mg by mouth  See admin instructions. Taking 2 capsules in morning and 3 capsules at night    . lisinopril (ZESTRIL) 20 MG tablet Take 1 tablet (20 mg total) by mouth daily. 90 tablet 1  . loperamide (IMODIUM A-D) 2 MG tablet Take 2 mg by mouth as needed for diarrhea or loose stools.    . metFORMIN (GLUCOPHAGE) 1000 MG tablet Take 1 tablet (1,000 mg total) by mouth 2 (two) times daily with a meal. 180 tablet 3  . metoprolol tartrate (LOPRESSOR) 25 MG tablet TAKE 1/2 TABLET TWO TIMES DAILY. (Patient taking differently: Take 12.5 mg by mouth See admin instructions. Take 12.5mg  twice daily, may take one additional 12.5mg  as needed for persistent arrhthymias.) 90 tablet 0  . potassium chloride (K-DUR) 10 MEQ tablet TAKE 1 TABLET EVERY DAY (Patient taking differently: Take 10 mEq by mouth daily. ) 90 tablet 1  . Probiotic Product (FORTIFY DAILY PROBIOTIC PO) Take 1 capsule by mouth daily.     Marland Kitchen tobramycin (TOBREX) 0.3 % ophthalmic solution Place 1 drop into both eyes See admin instructions. Place 1 drop in both eyes four times daily the day before, day of, and day after eye injections.    . Vitamin D, Ergocalciferol, (DRISDOL) 50000 units CAPS capsule Take 1 capsule (50,000 Units total) by mouth every 7 (seven) days. (Patient taking differently: Take 50,000 Units by mouth every Sunday. ) 12 capsule 3  . warfarin (COUMADIN) 6 MG tablet TAKE 1 TABLET EVERY DAY (Patient taking differently: Take 6 mg by mouth See admin instructions. Take 6mg  daily except SUN take 3mg .) 90 tablet 3  . Continuous Blood Gluc Receiver (FREESTYLE LIBRE 14 DAY READER) DEVI 1 application by Does not apply route 3 (three) times daily. 1 Device 5  . Continuous Blood Gluc Sensor (FREESTYLE LIBRE 14 DAY SENSOR) MISC 1 application by Does not apply route 3 (three) times daily. 1 each 5     Assessment: 78 y.o. female admitted with CVA, h/o Afib, to continue Coumadin. INR 1.6 on admit, increased to 1.9 s/p boosted dose yesterday. CBC wnl. No active bleed  issues documented.  Goal of Therapy:  INR 2-3 Monitor platelets by anticoagulation protocol: Yes   Plan:  Warfarin 6mg  PO x 1 dose tonight Monitor daily INR, CBC, s/sx bleeding   Elicia Lamp, PharmD, BCPS Please check AMION for all [place] contact numbers Clinical Pharmacist 12/25/2018 12:52 PM

## 2018-12-25 NOTE — Progress Notes (Addendum)
Physical Therapy Treatment Patient Details Name: Jeanette Yates MRN: [id number] DOB: [birth year] Today's Date: 12/25/2018    History of Present Illness 78 yo female admitted to ED on 9/29 with lightheadedness, dizziness. CT reveals R frontal lobe acute punctate ischemic CVA,  no residual deficits per neurologist. Pt also with 65-month history of room-spinning vertigo with no particular pattern of exacerbating factors. Pt COVID +. PMH includes HLD, HTN, DM, TIA, afib, vertigo, CAD s/p CABG, HF, macular degeneration, adjustment disorder, fibromyalgia, osteopenia.    PT Comments    Patient demonstrating improved dizziness today and noted no nystagmus with modified hallpike though pt slightly light headed coming up from position.  Feel she should progress safely with d/c home with intermittent family support.  PT to follow if not d/c.   Follow Up Recommendations  Home health PT(noted RNCM set up HHPT vestibular rehab)     Equipment Recommendations  None recommended by PT    Recommendations for Other Services       Precautions / Restrictions Precautions Precautions: Fall Restrictions Weight Bearing Restrictions: No    Mobility  Bed Mobility Overal bed mobility: Independent                Transfers Overall transfer level: Modified independent               General transfer comment: pt with self-steadying upon standing, no imbalance noted.  Ambulation/Gait Ambulation/Gait assistance: Independent Gait Distance (Feet): 70 Feet Assistive device: None Gait Pattern/deviations: Step-through pattern     General Gait Details: able to lift leg to scoot her bag out of the way walking around the bed without LOB   Stairs             Wheelchair Mobility    Modified Rankin (Stroke Patients Only) Modified Rankin (Stroke Patients Only) Pre-Morbid Rankin Score: Slight disability Modified Rankin: Moderate disability     Balance Overall balance assessment: Mild  deficits observed, not formally tested                                          Cognition Arousal/Alertness: Awake/alert Behavior During Therapy: WFL for tasks assessed/performed Overall Cognitive Status: Within Functional Limits for tasks assessed                                 General Comments: SBT able to correct mistake 0/28. No cognitive or attention deficit from this screen noted.   Vestibular Assessment - 12/25/18 0001      Oculomotor Exam   Oculomotor Alignment  Normal    Ocular ROM  WNL, but history of macular degeneration    Spontaneous  Absent    Gaze-induced   Age appropriate nystagmus at end range    Smooth Pursuits  Saccades   not smooth, but reports history of macular degeneration   Saccades  Intact      Vestibulo-Ocular Reflex   VOR 1 Head Only (x 1 viewing)  generally WNL, but notes difficulty with target maintenance more due to macular degeneration    VOR to Slow Head Movement  Comment   unable to test effectively due to macular degeneration   VOR Cancellation  Corrective saccades    Comment  difficulty to tease out but since asymptomatic feel likely no periopheral hypofunction, and some symptoms could be due to h/o "  TIA"s      Auditory   Comments  intact and equal to scratch test bilateral      Positional Testing   Sidelying Test  Sidelying Right;Sidelying Left      Sidelying Right   Sidelying Right Duration  30 sec    Sidelying Right Symptoms  No nystagmus      Sidelying Left   Sidelying Left Duration  30 sec    Sidelying Left Symptoms  No nystagmus        Exercises      General Comments General comments (skin integrity, edema, etc.): Reported no further dizziness after Eply performed yesterday; repeat modified hallpike today without symptoms of positional vertigo.  Education on how to access outpatient vestibular rehab if needed but that even if she thinks it is vertigo again to seek medical attention as dizziness  is also sign of a stroke.      Pertinent Vitals/Pain Pain Assessment: No/denies pain Faces Pain Scale: No hurt    Home Living Family/patient expects to be discharged to:: Private residence Living Arrangements: Children Available Help at Discharge: Family;Available 24 hours/day Type of Home: House Home Access: Stairs to enter   Home Layout: One level Home Equipment: Kasandra Knudsen - single point Additional Comments: lives in apartment attached to daughter's home    Prior Function Level of Independence: Independent          PT Goals (current goals can now be found in the care plan section) Acute Rehab PT Goals Patient Stated Goal: to go home Progress towards PT goals: Progressing toward goals    Frequency    Min 3X/week      PT Plan Discharge plan needs to be updated    Co-evaluation              AM-PAC PT "6 Clicks" Mobility   Outcome Measure  Help needed turning from your back to your side while in a flat bed without using bedrails?: None Help needed moving from lying on your back to sitting on the side of a flat bed without using bedrails?: None Help needed moving to and from a bed to a chair (including a wheelchair)?: None Help needed standing up from a chair using your arms (e.g., wheelchair or bedside chair)?: None Help needed to walk in hospital room?: None Help needed climbing 3-5 steps with a railing? : A Little 6 Click Score: 23    End of Session Equipment Utilized During Treatment: Other (comment) Activity Tolerance: Patient tolerated treatment well Patient left: in bed;with call bell/phone within reach(seated EOB)   PT Visit Diagnosis: Dizziness and giddiness (R42);Unsteadiness on feet (R26.81)     Time: 1130-1200 PT Time Calculation (min) (ACUTE ONLY): 30 min  Charges:  $Gait Training: 8-22 mins $Neuromuscular Re-education: 8-22 mins                     Magda Kiel, [place] [phone] 12/25/2018    Reginia Naas 12/25/2018, 4:57 PM

## 2018-12-25 NOTE — Consult Note (Addendum)
Cardiology Consultation:   Virtual Visit via Telephone Note   This visit type was conducted due to national recommendations for restrictions regarding the COVID-19 Pandemic (e.g. social distancing) in an effort to limit this patient's exposure and mitigate transmission in our community.  Due to her co-morbid illnesses, this patient is at least at moderate risk for complications without adequate follow up.  This format is felt to be most appropriate for this patient at this time.  The patient did not have access to video technology/had technical difficulties with video requiring transitioning to audio format only (telephone).  All issues noted in this document were discussed and addressed.  No physical exam could be performed with this format.  Please refer to the patient's chart for her  consent to telehealth for [facility].   Patient ID: Jeanette Yates MRN: [id number]; DOB: 10/15/1940  Admit date: 12/23/2018 Date of Consult: 12/25/2018  Primary Care Provider: Gayland Curry, DO Primary Cardiologist: Carlyle Dolly, MD  Primary Electrophysiologist:  None    Patient Profile:   Jeanette Yates is a 78 y.o. female with a hx of porcine AVR 11/2013 and mitral valve repair, paroxysmal atrial fibrillation on warfarin, TIA 2018, hypertension, hyperlipidemia, CAD s/p CABG 11/2013 at time of valve surgery, diabetes type 2, chronic systolic heart failure with EF 30-35% in 2015 and normalized by echo in 2017 who is being seen today for the evaluation of atrial fibrillation and reduced EF at the request of Dr. Doristine Bosworth.  History of Present Illness:   Jeanette Yates is a 78 y.o. female with history of porcine AVR 11/2013 and mitral valve repair, paroxysmal atrial fibrillation on warfarin, TIA 2018, hypertension, hyperlipidemia, CAD s/p CABG 11/2013 at time of valve surgery, diabetes type 2, chronic systolic heart failure with EF 30-35% in 2015 and normalized by echo in 2017.   Patient presented to [facility] on 12/23/2018 with recurrent episodes of transient dizziness and vertigo.  She is being followed by neurology.  MRI scan of the brain showed tiny right frontal cortical and subcortical infarct which is likely embolic from her atrial fibrillation.  Patient reported compliance with her warfarin.  Patient with a history of atrial fibrillation but has been maintaining sinus rhythm as an outpatient.  It appears that her A. fib may have been related to her cardiac surgery in 2015.  There is no documentation of recent A. fib.  The patient presented with symptoms of stroke and was found to be in atrial fibrillation with RVR.  She was previously on metoprolol 12.5 mg twice daily, likely low dose as her heart rate appears to be in the 50s-low 60s over the last couple of years.  Metoprolol was increased to 25 mg twice daily and heart rates have improved.  Pt has history of EF down to 30-35% in 2015 surrounding her need for valve surgery and CABG (at great [facility] cardiology in [facility]).  Echo in 04/2014 with improvement of EF to 40-45%.  Echo in 04/2015 with further improvement of EF to 50-55%.  EF 65% by echo in 08/2016. Echocardiogram done yesterday shows reduced LV systolic function with EF 35%, mildly increased LVH, diffuse hypokinesis with septal-lateral dyssynchrony, bioprosthetic aortic valve with no significant regurgitation or stenosis, repaired mitral valve with trivial regurgitation, moderately dilated LA. Medical therapy prior to admission included ACE inhibitor, low-dose beta-blocker, Lasix 20 mg daily, Jardiance. Renal function is normal.      Heart Pathway Score:     Past Medical History:  Diagnosis Date   Adjustment disorder 10/17/2005   Atrial fibrillation (HCC)    Cataract    bilateral   CHF (congestive heart failure) (HCC)    Contracture of knee joint 07/26/2009   Coronary artery disease    Degeneration of lumbar or lumbosacral intervertebral disc 07/26/2009    Diabetes mellitus without complication (HCC)    Esophageal spasm    Fibromyalgia    Hearing loss 12/18/2010   Heart murmur    Hypercholesteremia    Hypertension    Insomnia disorder related to known organic factor 10/10/2009   Leaky heart valve    Lyme disease    Macular degeneration of both eyes    Migraine 10/17/2005   Mixed incontinence 10/17/2005   Osteoarthritis    multiple joints    Overweight 09/19/2010   Pneumonia    PONV (postoperative nausea and vomiting)    Postartificial menopausal syndrome 10/10/2009   Prinzmetal angina ([place]) 10/17/2005   Psoriasis 06/12/2006   Sciatica    TIA (transient ischemic attack)    Type II diabetes mellitus ([place])    Urolith 10/17/2005    Past Surgical History:  Procedure Laterality Date   ABDOMINAL HYSTERECTOMY     AORTIC VALVE REPLACEMENT  12/10/2013   porcine   CATARACT EXTRACTION, BILATERAL     CHOLECYSTECTOMY  1981   COLONOSCOPY  04/18/2006   Dr. Nelva Nay: internal hemorrhoids   COLONOSCOPY  10/2003   Dr. Alphonsa Gin: hemorrhoids   CORONARY ARTERY BYPASS GRAFT  2015   ESOPHAGOGASTRODUODENOSCOPY  10/16/2013   Dr. Marla Roe: prior Nissen fundoplication intact, hypertonic LES, dilated up to 36 [place] with moderate resistance, gastritis but no H. pylori., Reactive gastritis, no celiac disease.   ESOPHAGOGASTRODUODENOSCOPY  05/29/2012   Dr. Doy Mince: Moderately severe esophagitis, acute gastritis reactive, no H pylori, no celiac. Esophageal biopsies consistent with GERD, no Barrett   ESOPHAGOGASTRODUODENOSCOPY  03/21/2009   Dr. Doy Mince: Reflux esophagitis, gastritis without H. pylori, esophagus stretched 12 savory   ESOPHAGOGASTRODUODENOSCOPY  02/20/2008   Dr. Doy Mince: Esophagus dilated to 41 [place], reactive gastropathy with no H pylori. No Barrett's on esophageal biopsy   ESOPHAGOGASTRODUODENOSCOPY  09/24/2006   Dr. Doy Mince: Tight wrap noted, reactive gastropathy, no Barrett's    ESOPHAGOGASTRODUODENOSCOPY (EGD) WITH PROPOFOL N/A 01/09/2017   Procedure: ESOPHAGOGASTRODUODENOSCOPY (EGD) WITH PROPOFOL;  Surgeon: Daneil Dolin, MD;  [facility];  Service: Endoscopy;  Laterality: N/A;  2:15PM   FRACTURE SURGERY Left    wrist   JOINT REPLACEMENT     MALONEY DILATION N/A 01/09/2017   Procedure: MALONEY DILATION;  Surgeon: Daneil Dolin, MD;  Location: [facility];  Service: Endoscopy;  Laterality: N/A;   MITRAL VALVE REPAIR  [id number]   NISSEN FUNDOPLICATION  [id number]   REPLACEMENT TOTAL KNEE BILATERAL  2010/2013     Home Medications:  Prior to Admission medications   Medication Sig Start Date End Date Taking? Authorizing Provider  acetaminophen (TYLENOL) 650 MG CR tablet Take 650-1,300 mg by mouth every 8 (eight) hours as needed for pain.    Yes [provider]  amLODipine (NORVASC) 5 MG tablet Take 1 tablet (5 mg total) by mouth daily. Patient taking differently: Take 5 mg by mouth at bedtime.  11/26/18  Yes Reed, Tiffany L, DO  aspirin EC 81 MG tablet Take 81 mg by mouth every morning.    Yes [provider]  atorvastatin (LIPITOR) 80 MG tablet TAKE 1 TABLET (80 MG TOTAL) BY MOUTH DAILY. 12/15/18  Yes Reed, Tiffany L,  DO  calcium citrate-vitamin D (CITRACAL+D) 315-200 MG-UNIT tablet Take 1 tablet by mouth 2 (two) times daily.   Yes [provider]  cetirizine (ZYRTEC) 10 MG chewable tablet Chew 10 mg by mouth daily.   Yes [provider]  empagliflozin (JARDIANCE) 10 MG TABS tablet Take 10 mg by mouth daily before supper. Patient taking differently: Take 10 mg by mouth daily.  12/11/18  Yes Reed, Tiffany L, DO  fluticasone (FLONASE) 50 MCG/ACT nasal spray USE 2 SPRAYS IN EACH NOSTRIL EVERY DAY Patient taking differently: Place 2 sprays into both nostrils daily.  12/15/18  Yes Reed, Tiffany L, DO  furosemide (LASIX) 20 MG tablet TAKE 1 TABLET (20 MG TOTAL) BY MOUTH DAILY. 12/15/18  Yes Reed, Tiffany L, DO  gabapentin  (NEURONTIN) 100 MG capsule Take 100 mg by mouth See admin instructions. Taking 2 capsules in morning and 3 capsules at night   Yes [provider]  lisinopril (ZESTRIL) 20 MG tablet Take 1 tablet (20 mg total) by mouth daily. 12/15/18  Yes Reed, Tiffany L, DO  loperamide (IMODIUM A-D) 2 MG tablet Take 2 mg by mouth as needed for diarrhea or loose stools.   Yes [provider]  metFORMIN (GLUCOPHAGE) 1000 MG tablet Take 1 tablet (1,000 mg total) by mouth 2 (two) times daily with a meal. 09/04/18  Yes Reed, Tiffany L, DO  metoprolol tartrate (LOPRESSOR) 25 MG tablet TAKE 1/2 TABLET TWO TIMES DAILY. Patient taking differently: Take 12.5 mg by mouth See admin instructions. Take 12.5mg  twice daily, may take one additional 12.5mg  as needed for persistent arrhthymias. 12/15/18  Yes Reed, Tiffany L, DO  potassium chloride (K-DUR) 10 MEQ tablet TAKE 1 TABLET EVERY DAY Patient taking differently: Take 10 mEq by mouth daily.  12/15/18  Yes Reed, Tiffany L, DO  Probiotic Product (FORTIFY DAILY PROBIOTIC PO) Take 1 capsule by mouth daily.    Yes [provider]  tobramycin (TOBREX) 0.3 % ophthalmic solution Place 1 drop into both eyes See admin instructions. Place 1 drop in both eyes four times daily the day before, day of, and day after eye injections. 07/08/18  Yes [provider]  Vitamin D, Ergocalciferol, (DRISDOL) 50000 units CAPS capsule Take 1 capsule (50,000 Units total) by mouth every 7 (seven) days. Patient taking differently: Take 50,000 Units by mouth every Sunday.  01/09/18  Yes Reed, Tiffany L, DO  warfarin (COUMADIN) 6 MG tablet TAKE 1 TABLET EVERY DAY Patient taking differently: Take 6 mg by mouth See admin instructions. Take 6mg  daily except SUN take 3mg . 06/05/18  Yes Branch, Alphonse Guild, MD  Continuous Blood Gluc Receiver (FREESTYLE LIBRE 14 DAY READER) DEVI 1 application by Does not apply route 3 (three) times daily. 10/30/18   Reed, Tiffany L, DO  Continuous Blood  Gluc Sensor (FREESTYLE LIBRE 14 DAY SENSOR) MISC 1 application by Does not apply route 3 (three) times daily. 10/30/18   Gayland Curry, DO    Inpatient Medications: Scheduled Meds:  aspirin EC  81 mg Oral Daily   atorvastatin  80 mg Oral Daily   calcium-vitamin D  1 tablet Oral BID WC   canagliflozin  100 mg Oral QAC breakfast   furosemide  20 mg Oral Daily   gabapentin  200 mg Oral Daily   And   gabapentin  300 mg Oral QHS   insulin aspart  0-15 Units Subcutaneous TID WC   metoprolol tartrate  25 mg Oral BID   potassium chloride  10 mEq  Oral Daily   Vitamin D (Ergocalciferol)  50,000 Units Oral Q7 days   warfarin  6 mg Oral ONCE-1800   Warfarin - Pharmacist Dosing Inpatient   Does not apply q1800   Continuous Infusions:  diltiazem (CARDIZEM) infusion Stopped (12/25/18 0030)   PRN Meds: acetaminophen **OR** acetaminophen (TYLENOL) oral liquid 160 mg/5 mL **OR** acetaminophen  Allergies:    Allergies  Allergen Reactions   Penicillins Anaphylaxis and Other (See Comments)    Has patient had a PCN reaction causing immediate rash, facial/tongue/throat swelling, SOB or lightheadedness with hypotension: Yes Has patient had a PCN reaction causing severe rash involving mucus membranes or skin necrosis: Yes Has patient had a PCN reaction that required hospitalization Yes Has patient had a PCN reaction occurring within the last 10 years: No If all of the above answers are "NO", then may proceed with Cephalosporin use.    Latex Other (See Comments)    Redness and rash   Morphine And Related Nausea Only and Other (See Comments)    Dizziness   Tape Other (See Comments)    Redness and rash   Iodine Other (See Comments)    Redness and rash    Social History:   Social History   Socioeconomic History   Marital status: Widowed    Spouse name: Not on file   Number of children: 3   Years of education: Not on file   Highest education level: Not on file    Occupational History   Not on file  Social Needs   Financial resource strain: Not hard at all   Food insecurity    Worry: Never true    Inability: Never true   Transportation needs    Medical: No    Non-medical: No  Tobacco Use   Smoking status: Former Smoker    Packs/day: 1.00    Years: 20.00    Pack years: 20.00    Types: Cigarettes    Quit date: 09/12/1966    Years since quitting: 52.3   Smokeless tobacco: Never Used  Substance and Sexual Activity   Alcohol use: No   Drug use: No   Sexual activity: Never  Lifestyle   Physical activity    Days per week: 6 days    Minutes per session: 30 min   Stress: Only a little  Relationships   Social connections    Talks on phone: More than three times a week    Gets together: More than three times a week    Attends religious service: More than 4 times per year    Active member of club or organization: No    Attends meetings of clubs or organizations: Never    Relationship status: Widowed   Intimate partner violence    Fear of current or ex partner: No    Emotionally abused: No    Physically abused: No    Forced sexual activity: No  Other Topics Concern   Not on file  Social History Narrative   Not on file    Family History:    Family History  Problem Relation Age of Onset   Stroke Mother    Heart failure Mother    Heart disease Mother    Mitral valve prolapse Mother    Diabetes Father    Heart attack Father    Stroke Father    Heart disease Father    Hypertension Father    Alzheimer's disease Father    Stroke Maternal Grandmother    Arthritis  Maternal Grandfather        hands   Breast cancer Paternal Grandmother    Osteoporosis Paternal Grandmother    Breast cancer Cousin    Colon cancer Neg Hx      ROS:  Please see the history of present illness.   All other ROS reviewed and negative.     Physical Exam/Data:   Vitals:   12/24/18 1252 12/25/18 0005 12/25/18 0900  12/25/18 1042  BP: (!) 115/97 (!) 124/94 (!) 148/83 (!) 143/116  Pulse: 83 63 (!) 107 (!) 104  Resp: 20 17    Temp: 98.1 F (36.7 C) 98.7 F (37.1 C) 97.7 F (36.5 C)   TempSrc: Oral Oral Oral   SpO2: 98%  97%   Weight:      Height:        Intake/Output Summary (Last 24 hours) at 12/25/2018 1414 Last data filed at 12/24/2018 1800 Gross per 24 hour  Intake 40 ml  Output --  Net 40 ml   Last 3 Weights 12/24/2018 12/23/2018 12/11/2018  Weight (lbs) 165 lb 9.1 oz 144 lb 167 lb 12.8 oz  Weight (kg) 75.1 kg 65.318 kg 76.114 kg     Body mass index is 30.28 kg/m.     EKG:  The EKG was personally reviewed and demonstrates: Fibrillation with left bundle branch block Telemetry:  Telemetry was personally reviewed and demonstrates: A. fib in the 60s-80s overnight, now rates in the 90s.  Relevant CV Studies:  Echocardiogram 12/24/2018 IMPRESSIONS   1. Left ventricular ejection fraction, by visual estimation, is 35%. The left ventricle has moderately decreased function. Normal left ventricular size. There is mildly increased left ventricular hypertrophy. Diffuse hypokinesis with septal-lateral  dyssynchrony.  2. Left ventricular diastolic Doppler parameters are indeterminate pattern of LV diastolic filling.  3. Global right ventricle has normal systolic function.The right ventricular size is normal. No increase in right ventricular wall thickness.  4. Bioprosthetic aortic valve. No significant regurgitation. Mean gradient 14 mmHg, no significant bioprosthetic aortic valve stenosis.  5. The mitral valve is s/p repair. Trivial regurgitation. Mean gradient 4 mmHg with MVA by PHT 2.97 cm^2, mild stenosis.  6. The tricuspid valve is normal in structure. Tricuspid valve regurgitation is mild.  7. Left atrial size was moderately dilated.  8. Right atrial size was mildly dilated.  9. The inferior vena cava is normal in size with greater than 50% respiratory variability, suggesting right atrial  pressure of 3 mmHg. 10. The tricuspid regurgitant velocity is 2.35 m/s, and with an assumed right atrial pressure of 3 mmHg, the estimated right ventricular systolic pressure is normal at 25.1 mmHg. 11. Trivial pericardial effusion is present. 12. The patient was in atrial fibrillation.    Laboratory Data:  High Sensitivity Troponin:   Recent Labs  Lab 12/23/18 1558 12/23/18 1730  TROPONINIHS 10 12     Chemistry Recent Labs  Lab 12/23/18 1558 12/24/18 0644  NA 137 139  K 4.1 3.6  CL 106 107  CO2 22 20*  GLUCOSE 147* 220*  BUN 25* 18  CREATININE 0.92 0.97  CALCIUM 9.3 9.0  GFRNONAA >60 57*  GFRAA >60 >60  ANIONGAP 9 12    No results for input(s): PROT, ALBUMIN, AST, ALT, ALKPHOS, BILITOT in the last 168 hours. Hematology Recent Labs  Lab 12/23/18 1558 12/24/18 0644  WBC 10.3 10.5  RBC 4.61 4.23  HGB 12.8 12.2  HCT 41.2 37.1  MCV 89.4 87.7  MCH 27.8 28.8  MCHC 31.1 32.9  RDW 13.9 14.1  PLT 422* 375   BNPNo results for input(s): BNP, PROBNP in the last 168 hours.  DDimer  Recent Labs  Lab 12/24/18 [phone]  DDIMER <0.27     Radiology/Studies:  Mr Angio Head Wo Contrast  Result Date: 12/24/2018 CLINICAL DATA:  Persistent central vertigo EXAM: MRI HEAD WITHOUT CONTRAST MRA HEAD WITHOUT CONTRAST TECHNIQUE: Multiplanar, multiecho pulse sequences of the brain and surrounding structures were obtained without intravenous contrast. Angiographic images of the head were obtained using MRA technique without contrast. COMPARISON:  05/02/2015 FINDINGS: MRI HEAD FINDINGS Brain: Punctate acute infarct along posterior right frontal cortex. Moderate size remote right parietal infarct affecting cortex and white matter. Remote lacunar infarct in the right corona radiata. Stable cerebral volume loss which is nonspecific. Mild ischemic gliosis mainly in the periventricular white matter. Coapted frontal horns of the lateral ventricles, incidental. No hemorrhage, hydrocephalus,  collection, or masslike finding. Vascular: Normal flow voids Skull and upper cervical spine: Negative for marrow lesion Sinuses/Orbits: Negative MRA HEAD FINDINGS Mild blurring at superior slices is attributed to motion. No branch occlusion, flow limiting stenosis, beading, or aneurysm. There is mild medium vessel atherosclerotic irregularity, but limited by described artifact. IMPRESSION: Brain MRI: 1. Punctate acute infarct in the right frontal cortex. 2. The otherwise stable from 2017, including remote right-sided lacunar and parietal infarcts. MRA: 1. Motion degraded study with no emergent or interval finding. 2. Mild atheromatous changes of medium size vessels. Electronically Signed   By: Monte Fantasia M.D.   On: 12/24/2018 04:37   Mr Brain Wo Contrast  Result Date: 12/24/2018 CLINICAL DATA:  Persistent central vertigo EXAM: MRI HEAD WITHOUT CONTRAST MRA HEAD WITHOUT CONTRAST TECHNIQUE: Multiplanar, multiecho pulse sequences of the brain and surrounding structures were obtained without intravenous contrast. Angiographic images of the head were obtained using MRA technique without contrast. COMPARISON:  05/02/2015 FINDINGS: MRI HEAD FINDINGS Brain: Punctate acute infarct along posterior right frontal cortex. Moderate size remote right parietal infarct affecting cortex and white matter. Remote lacunar infarct in the right corona radiata. Stable cerebral volume loss which is nonspecific. Mild ischemic gliosis mainly in the periventricular white matter. Coapted frontal horns of the lateral ventricles, incidental. No hemorrhage, hydrocephalus, collection, or masslike finding. Vascular: Normal flow voids Skull and upper cervical spine: Negative for marrow lesion Sinuses/Orbits: Negative MRA HEAD FINDINGS Mild blurring at superior slices is attributed to motion. No branch occlusion, flow limiting stenosis, beading, or aneurysm. There is mild medium vessel atherosclerotic irregularity, but limited by described  artifact. IMPRESSION: Brain MRI: 1. Punctate acute infarct in the right frontal cortex. 2. The otherwise stable from 2017, including remote right-sided lacunar and parietal infarcts. MRA: 1. Motion degraded study with no emergent or interval finding. 2. Mild atheromatous changes of medium size vessels. Electronically Signed   By: Monte Fantasia M.D.   On: 12/24/2018 04:37   Dg Chest Port 1 View  Result Date: 12/24/2018 CLINICAL DATA:  Atrial fibrillation EXAM: PORTABLE CHEST 1 VIEW COMPARISON:  06/18/2017 FINDINGS: Cardiomegaly. Prior median sternotomy for CABG, aortic valve replacement, and mitral valve replacement. History of COVID-19 positivity. Interstitial coarsening is stable. There is no edema, consolidation, effusion, or pneumothorax. Generalized osteopenia IMPRESSION: Stable exam.  No acute finding. Electronically Signed   By: Monte Fantasia M.D.   On: 12/24/2018 06:30   Vas US Carotid (at [place] Only)  Result Date: 12/25/2018 Carotid Arterial Duplex Study Indications:      CVA. Risk Factors:     Hypertension, hyperlipidemia, Diabetes,  coronary artery                   disease. Comparison Study: no prior Performing Technologist: Abram Sander RVS  Examination Guidelines: A complete evaluation includes B-mode imaging, spectral Doppler, color Doppler, and power Doppler as needed of all accessible portions of each vessel. Bilateral testing is considered an integral part of a complete examination. Limited examinations for reoccurring indications may be performed as noted.  Right Carotid Findings: +----------+--------+--------+--------+-------------------------+--------+             PSV cm/s EDV cm/s Stenosis Plaque Description        Comments  +----------+--------+--------+--------+-------------------------+--------+  CCA Prox   56       16                heterogenous                        +----------+--------+--------+--------+-------------------------+--------+  CCA Distal 38       9                  heterogenous                        +----------+--------+--------+--------+-------------------------+--------+  ICA Prox   68       16       1-39%    calcific and heterogenous           +----------+--------+--------+--------+-------------------------+--------+  ICA Distal 68       20                                                    +----------+--------+--------+--------+-------------------------+--------+  ECA        83                                                             +----------+--------+--------+--------+-------------------------+--------+ +----------+--------+-------+--------+-------------------+             PSV cm/s EDV cms Describe Arm Pressure (mmHG)  +----------+--------+-------+--------+-------------------+  Subclavian 111                                            +----------+--------+-------+--------+-------------------+ +---------+--------+--+--------+--+---------+  Vertebral PSV cm/s 55 EDV cm/s 15 Antegrade  +---------+--------+--+--------+--+---------+  Left Carotid Findings: +----------+--------+--------+--------+------------------+--------+             PSV cm/s EDV cm/s Stenosis Plaque Description Comments  +----------+--------+--------+--------+------------------+--------+  CCA Prox   47       11                heterogenous                 +----------+--------+--------+--------+------------------+--------+  CCA Distal 41       9                 heterogenous                 +----------+--------+--------+--------+------------------+--------+  ICA Prox   105      14  1-39%    heterogenous                 +----------+--------+--------+--------+------------------+--------+  ICA Distal 107      26                                             +----------+--------+--------+--------+------------------+--------+  ECA        155                                                     +----------+--------+--------+--------+------------------+--------+  +----------+--------+--------+--------+-------------------+             PSV cm/s EDV cm/s Describe Arm Pressure (mmHG)  +----------+--------+--------+--------+-------------------+  Subclavian 69                                              +----------+--------+--------+--------+-------------------+ +---------+--------+--+--------+--+---------+  Vertebral PSV cm/s 45 EDV cm/s 14 Antegrade  +---------+--------+--+--------+--+---------+  Summary: Right Carotid: Velocities in the right ICA are consistent with a 1-39% stenosis. Left Carotid: Velocities in the left ICA are consistent with a 1-39% stenosis. Vertebrals: Bilateral vertebral arteries demonstrate antegrade flow. *See table(s) above for measurements and observations.  Electronically signed by Antony Contras MD on 12/25/2018 at 1:08:06 PM.    Final     Assessment and Plan:   Chronic systolic heart failure -History of EF down to 30-35% in 2015 surrounding her need for valve surgery and CABG (at great [facility] cardiology in [facility]).  Echo in 04/2014 with improvement of EF to 40-45%.  Echo in 04/2015 with further improvement of EF to 50-55%.  EF 65% by echo in 08/2016. -Echocardiogram done yesterday shows reduced LV systolic function with EF 35%, mildly increased LVH, diffuse hypokinesis with septal-lateral dyssynchrony, bioprosthetic aortic valve with no significant regurgitation or stenosis, repaired mitral valve with trivial regurgitation, moderately dilated LA -Medical therapy prior to admission included ACE inhibitor, low-dose beta-blocker, Lasix 20 mg daily, Jardiance.  -Renal function is normal.  -Pt is not symptomatic for heart failure -In setting of LBBB and decreased EF, could consider BiV ICD with resynchronization therapy in the future if she develops clinical heart failure. Could consider Entresto at some point.  -Will arrange for EP office visit in 1-2 months.   Paroxysmal atrial fibrillation -Patient has been maintaining sinus  rhythm and was in sinus rhythm in June 2019.  Patient in A. fib with RVR on presentation to the hospital. -CHA2DS2/VAS Stroke Risk Score is  9 (CHF, Vasc Dz, HTN, age (2), DM, stroke (2), female). She is anticoagulated with warfarin for stroke risk reduction.  -Patient has had a baseline low heart rate in the 50s-low 60s and is on low-dose beta-blocker at home. Metoprolol has been increased to 25 mg BID.  Increasing beta-blocker may lead to significant bradycardia should she converts back to sinus rhythm.  With her prior history of stroke and this new stroke it would be optimal to achieve sinus rhythm.  -Currently pt remains in afib with rates 60's-80's overnight and currently in the 90's.   Positive COVID-19 -Patient noted to be asymptomatic from a pulmonary standpoint.  No acute findings on chest x-ray. -Patient in isolation. Will need to quarantine at home.    Ischemic stroke -Patient presented with recurrent episodes of dizziness.  Brain MRI showed a punctate infarct in the right frontal lobe, being followed by neurology who felt that infarct was likely embolic from her atrial fibrillation.  The patient is anticoagulated with warfarin and reports compliance.  Labs indicate therapeutic INR in July and September.  On presentation INR was 2.0. INR is 1.9 today. Goal INR 2-3.  -Patient has been on anticoagulation as well as aspirin in the setting of prior TIA.  These are continued inpatient. -?if Covid 19 contributed to the stroke with hypercoagulability.  -INR will be checked at office in 2 weeks after quarantine over.   Hypertension -Amlodipine 5 mg, Lasix 20 mg, lisinopril 20 mg, metoprolol 12.5 mg twice daily -Blood pressure is elevated.   Hyperlipidemia -On high intensity statin with atorvastatin 80 mg daily -Lipid panel today with LDL 57, at goal of <70.   Diabetes type 2 -A1c 7.5.      For questions or updates, please contact [place] Please consult [email or web address] for contact  info under     Signed, Daune Perch, NP  12/25/2018 2:14 PM  I have seen and examined the patient along with Daune Perch, NP .  I have reviewed the chart, notes and new data.  I agree with PA/NP's note.  Key new complaints: denies edema, no dyspnea. Was able to walk 2.5 miles on [place] just 2 weeks ago. Occasional days with lower exercise tolerance, unpredictably. Unaware of irregular heart beat when in atrial fibrillation. No respiratory complaints related to COVID Key examination changes: phone consultation Key new findings / data: reviewed current echo and 2017 side-by-side. Also reviewed multiple ECGs.  The major change is LBBB-related severe dyssynchrony that I believe can entirely explain the difference in LVEF. She has typical LBBB now 140 ms, which may be rate-related. In 2017 QRS was narrow 100 ms.  AF rate is now well controlled (70s asleep, 80-90s awake). INR is subtherapeutic 1.9. Ona ACEi, beta blocker.  PLAN: She has well compensated CHF (NYHA class 2). Transient episodes of worsening exercise tolerance may be related to transient AFib with rate related LBBB.  I would not DC home this patient in AFib and recent cerebral infarct (presumed embolic) until INR 99991111. She will not be able to go to [facility] while in quarantine. OK to recheck INR this PM to see if in target range, DC if now >2.0.  Have arranged [facility] f/u in 2 weeks, office visit w Dr. Harl Bowie and EP consultation w Dr. Rayann Heman to discuss potential biventricular pacemaker in the future.  Sanda Klein, MD, [place] [phone] 12/25/2018, 2:59 PM

## 2018-12-25 NOTE — Progress Notes (Signed)
PROGRESS NOTE    Jeanette Yates  [id number] DOB: 1940-08-02 DOA: 12/23/2018 PCP: Gayland Curry, DO   Brief Narrative:  Jeanette Yates is a 78 y.o. female with medical history significant of A.Fib on coumadin, CAD s/p CABG, HFpEF.  Patient presented to the ED with c/o dizziness, Sensation of room spinning.  This has been intermittently ongoing for the past month or two.  No obvious pattern of onset.  She has had vertigo in the past that was attributed to an inner ear issue, but spontaneously resolved. During dizziness episodes she feels off balance, feels like she is going to falland needs to sit down. Has also noticed bilateral blurry vision that last sometimes 1 to 2 hours after the dizziness resolves. Daughter has checked her blood pressure and her CBGs during these episodes. Her BPs have been in the 0000000 to 123456 systolic and her CBGs have been in the 100s to 250s. She just started Jardiance for diabetes yesterday morning.  Upon arrival to ED, MRI of brain was done which showed punctate R frontal acute stroke and patient was in atrial fibrillation with rapid ventricular response. Also her COVID has come back positive.  She was then admitted under [place].  Neurology was consulted.  Carotid Doppler unremarkable.  Transthoracic echo did not show any PFO.  Assessment & Plan:   Principal Problem:   Acute ischemic stroke [facility]) Active Problems:   Essential hypertension   Type 2 diabetes mellitus with complication, without long-term current use of insulin (HCC)   Atrial fibrillation (HCC)   Hyperlipidemia associated with type 2 diabetes mellitus ([place])   COVID-19 virus infection   Stroke [facility])   Vertigo/acute presumably embolic right frontal lobe punctate stroke: Patient was seen by neurology.  She is already on aspirin and Coumadin.  Her blood pressure remains controlled.  She does not have any focal neurological deficit.  Carotid Doppler negative.  Transthoracic echo negative.  She was  evaluated by PT OT and she does not have any focal deficit and she has been cleared for discharge with home health PT.  History of atrial fibrillation with RVR: Patient came in with RVR and she was started on Cardizem drip.  Once her rates were controlled, she was taken off of that and her Lopressor was increased.  However, she went into RVR once again yesterday and she was started on Cardizem drip which was stopped early this morning.  Continue to monitor.  Her INR is 1.9.  She was seen by cardiology and they recommend keeping her in the hospital until her INR becomes therapeutic due to recent embolic stroke.  Continue Coumadin per pharmacy dose.  History of diastolic and systolic congestive heart failure: [place], she had history of systolic congestive heart failure with 35% ejection fraction back in 2015 which improved in 2017.  Echo this hospitalization once again shows 35% ejection fraction.  Per cardiology, this is old.  No new medications recommendations at this point in time.  She is compensated.  Continue home Lasix.  COVID-19 positive: No signs or symptoms regarding this.  Maintain isolation.  Monitor.  Hypertension: Controlled.  Type 2 diabetes mellitus: Blood sugar controlled.  Continue SSI.  DVT prophylaxis: Coumadin Code Status: Full code Family Communication:  None present at bedside.  Patient had several questions and concerns mostly around her COVID-19 positive status.  I spent approximately 20 minutes trying to explain and answering her questions.  Unfortunately, she kept repeating her questions and I answered same questions approximately  3 times.  Some of her questions were, why did I get COVID-19?  When did I contact it?  How did I contact it?  What should I tell my family?  What should I tell airlines when I fly?  What she detail to people if I volunteer or try to get a job?  Am I going to be positive forever?  When I told her that she needs to quarantine for at least 2 weeks from  the date she is positive, her response was, what will happen after 14 days, will I be cured magically?  I did refer her to the scientific data available but she did not want to believing that either.  Despite of my answers, she continued to ask more questions, I offered her that order to reduce exposure time, I can call her after seeing all the patients.  She became upset and wanted me to be in the room, face-to-face and answered her questions which I did.  Her daughter [name] then called me and she asked me some of those questions again.  She also verbalized that her mom has short-term memory loss and that she does not believe in data and that she has tendency to be argumentative with physicians.  She apologized on her behalf and stated that she will talk to her mother and try to convince her. Disposition Plan: Most likely discharge home tomorrow.  Consultants:   Neurology  Cardiology  Procedures:   None  Antimicrobials:   None   Subjective: Patient seen and examined.  She had no complaint.  No more dizziness.  Objective: Vitals:   12/24/18 1252 12/25/18 0005 12/25/18 0900 12/25/18 1042  BP: (!) 115/97 (!) 124/94 (!) 148/83 (!) 143/116  Pulse: 83 63 (!) 107 (!) 104  Resp: 20 17    Temp: 98.1 F (36.7 C) 98.7 F (37.1 C) 97.7 F (36.5 C)   TempSrc: Oral Oral Oral   SpO2: 98%  97%   Weight:      Height:        Intake/Output Summary (Last 24 hours) at 12/25/2018 1519 Last data filed at 12/24/2018 1800 Gross per 24 hour  Intake 40 ml  Output --  Net 40 ml   Filed Weights   12/23/18 1512 12/24/18 0635  Weight: 65.3 kg 75.1 kg    Examination:  General exam: Appears calm and comfortable  Respiratory system: Clear to auscultation. Respiratory effort normal. Cardiovascular system: S1 & S2 heard, RRR. No JVD, murmurs, rubs, gallops or clicks. No pedal edema. Gastrointestinal system: Abdomen is nondistended, soft and nontender. No organomegaly or masses felt. Normal bowel  sounds heard. Central nervous system: Alert and oriented. No focal neurological deficits. Extremities: Symmetric 5 x 5 power. Skin: No rashes, lesions or ulcers Psychiatry: Judgement and insight appear normal. Mood & affect appropriate.    Data Reviewed: I have personally reviewed following labs and imaging studies  CBC: Recent Labs  Lab 12/23/18 1558 12/24/18 0644  WBC 10.3 10.5  NEUTROABS  --  7.2  HGB 12.8 12.2  HCT 41.2 37.1  MCV 89.4 87.7  PLT 422* 123456   Basic Metabolic Panel: Recent Labs  Lab 12/23/18 1558 12/24/18 0644  NA 137 139  K 4.1 3.6  CL 106 107  CO2 22 20*  GLUCOSE 147* 220*  BUN 25* 18  CREATININE 0.92 0.97  CALCIUM 9.3 9.0   GFR: Estimated Creatinine Clearance: 46.8 mL/min (by C-G formula based on SCr of 0.97 mg/dL). Liver Function Tests: No  results for input(s): AST, ALT, ALKPHOS, BILITOT, PROT, ALBUMIN in the last 168 hours. No results for input(s): LIPASE, AMYLASE in the last 168 hours. No results for input(s): AMMONIA in the last 168 hours. Coagulation Profile: Recent Labs  Lab 12/23/18 1558 12/24/18 0644 12/25/18 0504  INR 2.0* 1.6* 1.9*   Cardiac Enzymes: No results for input(s): CKTOTAL, CKMB, CKMBINDEX, TROPONINI in the last 168 hours. BNP (last 3 results) No results for input(s): PROBNP in the last 8760 hours. HbA1C: Recent Labs    12/25/18 0504  HGBA1C 7.5*   CBG: Recent Labs  Lab 12/24/18 1249 12/24/18 1620 12/24/18 2100 12/25/18 0911 12/25/18 1207  GLUCAP 205* 269* 92 108* 182*   Lipid Profile: Recent Labs    12/25/18 0504  CHOL 120  HDL 39*  LDLCALC 57  TRIG 120  CHOLHDL 3.1   Thyroid Function Tests: No results for input(s): TSH, T4TOTAL, FREET4, T3FREE, THYROIDAB in the last 72 hours. Anemia Panel: No results for input(s): VITAMINB12, FOLATE, FERRITIN, TIBC, IRON, RETICCTPCT in the last 72 hours. Sepsis Labs: Recent Labs  Lab 12/24/18 0644  PROCALCITON <0.10    Recent Results (from the past 240  hour(s))  SARS Coronavirus 2 [facility] order, Performed in [facility] hospital lab) Nasopharyngeal Nasopharyngeal Swab     Status: Abnormal   Collection Time: 12/23/18  8:15 PM   Specimen: Nasopharyngeal Swab  Result Value Ref Range Status   SARS Coronavirus 2 POSITIVE (A) NEGATIVE Final    Comment: RESULT CALLED TO, READ BACK BY AND VERIFIED WITH: M DOSS,RN @2150  12/23/18 MKELLY (NOTE) If result is NEGATIVE SARS-CoV-2 target nucleic acids are NOT DETECTED. The SARS-CoV-2 RNA is generally detectable in upper and lower  respiratory specimens during the acute phase of infection. The lowest  concentration of SARS-CoV-2 viral copies this assay can detect is 250  copies / mL. A negative result does not preclude SARS-CoV-2 infection  and should not be used as the sole basis for treatment or other  patient management decisions.  A negative result may occur with  improper specimen collection / handling, submission of specimen other  than nasopharyngeal swab, presence of viral mutation(s) within the  areas targeted by this assay, and inadequate number of viral copies  (<250 copies / mL). A negative result must be combined with clinical  observations, patient history, and epidemiological information. If result is POSITIVE SARS-CoV-2 target nucleic acids are DETECTED. The S ARS-CoV-2 RNA is generally detectable in upper and lower  respiratory specimens during the acute phase of infection.  Positive  results are indicative of active infection with SARS-CoV-2.  Clinical  correlation with patient history and other diagnostic information is  necessary to determine patient infection status.  Positive results do  not rule out bacterial infection or co-infection with other viruses. If result is PRESUMPTIVE POSTIVE SARS-CoV-2 nucleic acids MAY BE PRESENT.   A presumptive positive result was obtained on the submitted specimen  and confirmed on repeat testing.  While 2019 novel coronavirus    (SARS-CoV-2) nucleic acids may be present in the submitted sample  additional confirmatory testing may be necessary for epidemiological  and / or clinical management purposes  to differentiate between  SARS-CoV-2 and other Sarbecovirus currently known to infect humans.  If clinically indicated additional testing with an alternate test  methodology [id number]) is adv ised. The SARS-CoV-2 RNA is generally  detectable in upper and lower respiratory specimens during the acute  phase of infection. The expected result is Negative. Fact Sheet for Patients:  StrictlyIdeas.no Fact Sheet for Healthcare Providers: BankingDealers.co.za This test is not yet approved or cleared by the [place] FDA and has been authorized for detection and/or diagnosis of SARS-CoV-2 by FDA under an Emergency Use Authorization (EUA).  This EUA will remain in effect (meaning this test can be used) for the duration of the COVID-19 declaration under Section 564(b)(1) of the Act, 21 U.S.C. section 360bbb-3(b)(1), unless the authorization is terminated or revoked sooner. Performed at [facility], [address] [place], [place] [address]   Urine culture     Status: Abnormal   Collection Time: 12/24/18  1:53 AM   Specimen: Urine, Random  Result Value Ref Range Status   Specimen Description URINE, RANDOM  Final   Special Requests   Final    NONE Performed at [facility], [place] [address] [place], [place] [address]    Culture MULTIPLE SPECIES PRESENT, SUGGEST RECOLLECTION (A)  Final   Report Status 12/25/2018 FINAL  Final  MRSA PCR Screening     Status: None   Collection Time: 12/24/18  6:54 AM   Specimen: Nasopharyngeal  Result Value Ref Range Status   MRSA by PCR NEGATIVE NEGATIVE Final    Comment:        The GeneXpert MRSA Assay (FDA approved for NASAL specimens only), is one component of a comprehensive MRSA colonization surveillance program. It is  not intended to diagnose MRSA infection nor to guide or monitor treatment for MRSA infections. Performed at [facility], [place] [address], [place], [place] [address]       Radiology Studies: Mr Angio Head Wo Contrast  Result Date: 12/24/2018 CLINICAL DATA:  Persistent central vertigo EXAM: MRI HEAD WITHOUT CONTRAST MRA HEAD WITHOUT CONTRAST TECHNIQUE: Multiplanar, multiecho pulse sequences of the brain and surrounding structures were obtained without intravenous contrast. Angiographic images of the head were obtained using MRA technique without contrast. COMPARISON:  05/02/2015 FINDINGS: MRI HEAD FINDINGS Brain: Punctate acute infarct along posterior right frontal cortex. Moderate size remote right parietal infarct affecting cortex and white matter. Remote lacunar infarct in the right corona radiata. Stable cerebral volume loss which is nonspecific. Mild ischemic gliosis mainly in the periventricular white matter. Coapted frontal horns of the lateral ventricles, incidental. No hemorrhage, hydrocephalus, collection, or masslike finding. Vascular: Normal flow voids Skull and upper cervical spine: Negative for marrow lesion Sinuses/Orbits: Negative MRA HEAD FINDINGS Mild blurring at superior slices is attributed to motion. No branch occlusion, flow limiting stenosis, beading, or aneurysm. There is mild medium vessel atherosclerotic irregularity, but limited by described artifact. IMPRESSION: Brain MRI: 1. Punctate acute infarct in the right frontal cortex. 2. The otherwise stable from 2017, including remote right-sided lacunar and parietal infarcts. MRA: 1. Motion degraded study with no emergent or interval finding. 2. Mild atheromatous changes of medium size vessels. Electronically Signed   By: Monte Fantasia M.D.   On: 12/24/2018 04:37   Mr Brain Wo Contrast  Result Date: 12/24/2018 CLINICAL DATA:  Persistent central vertigo EXAM: MRI HEAD WITHOUT CONTRAST MRA HEAD WITHOUT CONTRAST TECHNIQUE:  Multiplanar, multiecho pulse sequences of the brain and surrounding structures were obtained without intravenous contrast. Angiographic images of the head were obtained using MRA technique without contrast. COMPARISON:  05/02/2015 FINDINGS: MRI HEAD FINDINGS Brain: Punctate acute infarct along posterior right frontal cortex. Moderate size remote right parietal infarct affecting cortex and white matter. Remote lacunar infarct in the right corona radiata. Stable cerebral volume loss which is nonspecific. Mild ischemic gliosis mainly in the  periventricular white matter. Coapted frontal horns of the lateral ventricles, incidental. No hemorrhage, hydrocephalus, collection, or masslike finding. Vascular: Normal flow voids Skull and upper cervical spine: Negative for marrow lesion Sinuses/Orbits: Negative MRA HEAD FINDINGS Mild blurring at superior slices is attributed to motion. No branch occlusion, flow limiting stenosis, beading, or aneurysm. There is mild medium vessel atherosclerotic irregularity, but limited by described artifact. IMPRESSION: Brain MRI: 1. Punctate acute infarct in the right frontal cortex. 2. The otherwise stable from 2017, including remote right-sided lacunar and parietal infarcts. MRA: 1. Motion degraded study with no emergent or interval finding. 2. Mild atheromatous changes of medium size vessels. Electronically Signed   By: Monte Fantasia M.D.   On: 12/24/2018 04:37   Dg Chest Port 1 View  Result Date: 12/24/2018 CLINICAL DATA:  Atrial fibrillation EXAM: PORTABLE CHEST 1 VIEW COMPARISON:  06/18/2017 FINDINGS: Cardiomegaly. Prior median sternotomy for CABG, aortic valve replacement, and mitral valve replacement. History of COVID-19 positivity. Interstitial coarsening is stable. There is no edema, consolidation, effusion, or pneumothorax. Generalized osteopenia IMPRESSION: Stable exam.  No acute finding. Electronically Signed   By: Monte Fantasia M.D.   On: 12/24/2018 06:30   Vas US  Carotid (at [place] Only)  Result Date: 12/25/2018 Carotid Arterial Duplex Study Indications:      CVA. Risk Factors:     Hypertension, hyperlipidemia, Diabetes, coronary artery                   disease. Comparison Study: no prior Performing Technologist: Abram Sander RVS  Examination Guidelines: A complete evaluation includes B-mode imaging, spectral Doppler, color Doppler, and power Doppler as needed of all accessible portions of each vessel. Bilateral testing is considered an integral part of a complete examination. Limited examinations for reoccurring indications may be performed as noted.  Right Carotid Findings: +----------+--------+--------+--------+-------------------------+--------+             PSV cm/s EDV cm/s Stenosis Plaque Description        Comments  +----------+--------+--------+--------+-------------------------+--------+  CCA Prox   56       16                heterogenous                        +----------+--------+--------+--------+-------------------------+--------+  CCA Distal 38       9                 heterogenous                        +----------+--------+--------+--------+-------------------------+--------+  ICA Prox   68       16       1-39%    calcific and heterogenous           +----------+--------+--------+--------+-------------------------+--------+  ICA Distal 68       20                                                    +----------+--------+--------+--------+-------------------------+--------+  ECA        83                                                             +----------+--------+--------+--------+-------------------------+--------+ +----------+--------+-------+--------+-------------------+  PSV cm/s EDV cms Describe Arm Pressure (mmHG)  +----------+--------+-------+--------+-------------------+  Subclavian 111                                            +----------+--------+-------+--------+-------------------+ +---------+--------+--+--------+--+---------+   Vertebral PSV cm/s 55 EDV cm/s 15 Antegrade  +---------+--------+--+--------+--+---------+  Left Carotid Findings: +----------+--------+--------+--------+------------------+--------+             PSV cm/s EDV cm/s Stenosis Plaque Description Comments  +----------+--------+--------+--------+------------------+--------+  CCA Prox   47       11                heterogenous                 +----------+--------+--------+--------+------------------+--------+  CCA Distal 41       9                 heterogenous                 +----------+--------+--------+--------+------------------+--------+  ICA Prox   105      14       1-39%    heterogenous                 +----------+--------+--------+--------+------------------+--------+  ICA Distal 107      26                                             +----------+--------+--------+--------+------------------+--------+  ECA        155                                                     +----------+--------+--------+--------+------------------+--------+ +----------+--------+--------+--------+-------------------+             PSV cm/s EDV cm/s Describe Arm Pressure (mmHG)  +----------+--------+--------+--------+-------------------+  Subclavian 69                                              +----------+--------+--------+--------+-------------------+ +---------+--------+--+--------+--+---------+  Vertebral PSV cm/s 45 EDV cm/s 14 Antegrade  +---------+--------+--+--------+--+---------+  Summary: Right Carotid: Velocities in the right ICA are consistent with a 1-39% stenosis. Left Carotid: Velocities in the left ICA are consistent with a 1-39% stenosis. Vertebrals: Bilateral vertebral arteries demonstrate antegrade flow. *See table(s) above for measurements and observations.  Electronically signed by Antony Contras MD on 12/25/2018 at 1:08:06 PM.    Final     Scheduled Meds:  aspirin EC  81 mg Oral Daily   atorvastatin  80 mg Oral Daily   calcium-vitamin D  1 tablet Oral BID WC    canagliflozin  100 mg Oral QAC breakfast   furosemide  20 mg Oral Daily   gabapentin  200 mg Oral Daily   And   gabapentin  300 mg Oral QHS   insulin aspart  0-15 Units Subcutaneous TID WC   metoprolol tartrate  25 mg Oral BID   potassium chloride  10 mEq Oral Daily   Vitamin D (Ergocalciferol)  50,000 Units Oral Q7 days   warfarin  6 mg Oral ONCE-1800   Warfarin - Pharmacist Dosing Inpatient   Does not apply q1800   Continuous Infusions:  diltiazem (CARDIZEM) infusion Stopped (12/25/18 0030)     LOS: 1 day   Time spent: 45 minutes   Darliss Cheney, MD [facility] Pager [phone]  If 7PM-7AM, please contact night-coverage [email or web address] Password [email or web address] 12/25/2018, 3:19 PM

## 2018-12-25 NOTE — Progress Notes (Signed)
SLP Cancellation Note  Patient Details Name: CHAYIL GENO MRN: [id number] DOB: Jul 26, 1940   Cancelled treatment:       Reason Eval/Treat Not Completed: SLP screened chart, spoke with RN and PT; no needs identified, will sign off.  Ameerah Huffstetler L. Tivis Ringer, [place] CCC/SLP Acute Rehabilitation Services Office number [phone] Pager [phone]    Juan Quam Laurice 12/25/2018, 1:36 PM

## 2018-12-25 NOTE — Plan of Care (Signed)
  Problem: Education: Goal: Knowledge of disease or condition will improve Outcome: Progressing Goal: Knowledge of secondary prevention will improve Outcome: Progressing   Problem: Self-Care: Goal: Verbalization of feelings and concerns over difficulty with self-care will improve Outcome: Progressing Goal: Ability to communicate needs accurately will improve Outcome: Progressing

## 2018-12-25 NOTE — Progress Notes (Signed)
Nutrition Note  RD consulted for diet education.   **RD working remotely**  Patient has been ordered a Armed forces operational officer. RD attempted to reach pt by phone but had busy signals x 2.  Per chart review, pt's daughter has stated that pt has short term memory loss.  Will provide vegan diet information in discharge instructions.  Body mass index is 30.28 kg/m. Patient meets criteria for obesity based on current BMI.   Current diet order is vegetarian/vegan. Labs and medications reviewed.   Clayton Bibles, MS, RD, LDN Inpatient Clinical Dietitian Pager: [phone] After Hours Pager: [phone]

## 2018-12-25 NOTE — Evaluation (Addendum)
Occupational Therapy Evaluation Patient Details Name: Jeanette Yates MRN: [id number] DOB: 02-22-1941 Today's Date: 12/25/2018    History of Present Illness 78 yo female admitted to ED on 9/29 with lightheadedness, dizziness. CT reveals R frontal lobe acute punctate ischemic CVA,  no residual deficits per neurologist. Pt also with 33-month history of room-spinning vertigo with no particular pattern of exacerbating factors. Pt COVID +. PMH includes HLD, HTN, DM, TIA, afib, vertigo, CAD s/p CABG, HF, macular degeneration, adjustment disorder, fibromyalgia, osteopenia.   Clinical Impression   Pt PTA: Pt living alone in her apartment attached to her daughter's home. Pt currently with no dizziness and no other focal deficits related to CVA.  Pt's only deficit is macular degeneration that she has been living with the past few years. pt does not drive and pt's daughter performing medication management. Pt continues to keep busy with hobbies involving mostly touch-knitting small projects. Short Blessed Test: able to correct mistake of naming motnhs of year in reverse- skipping March at first, but able to start again and perform correctly with no cues score: 0/28. No cognitive or attention deficit from this screen noted. Pt ambulatory and performing own ADL tasks with modified independence. Pt does not require continued OT. OT signing off.      Follow Up Recommendations  No OT follow up    Equipment Recommendations  None recommended by OT    Recommendations for Other Services       Precautions / Restrictions Precautions Precautions: Fall Restrictions Weight Bearing Restrictions: No      Mobility Bed Mobility Overal bed mobility: Modified Independent                Transfers Overall transfer level: Modified independent               General transfer comment: pt with self-steadying upon standing, no imbalance noted.    Balance Overall balance assessment: Mild deficits observed,  not formally tested                                         ADL either performed or assessed with clinical judgement   ADL Overall ADL's : At baseline;Modified independent                                       General ADL Comments: Pt's only deficit is macular degeneration that she has been living with the past few years. pt does not drive and pt's daughter performing medication management.     Vision Baseline Vision/History: Macular Degeneration Patient Visual Report: No change from baseline Vision Assessment?: Yes Eye Alignment: Within Functional Limits Ocular Range of Motion: Within Functional Limits Alignment/Gaze Preference: Within Defined Limits Tracking/Visual Pursuits: Other (comment)(pt able to see poorly at baseline)     Perception     Praxis      Pertinent Vitals/Pain Pain Assessment: Faces Faces Pain Scale: No hurt     Hand Dominance Right   Extremity/Trunk Assessment Upper Extremity Assessment Upper Extremity Assessment: Overall WFL for tasks assessed   Lower Extremity Assessment Lower Extremity Assessment: Overall WFL for tasks assessed   Cervical / Trunk Assessment Cervical / Trunk Assessment: Normal   Communication Communication Communication: No difficulties   Cognition Arousal/Alertness: Awake/alert Behavior During Therapy: WFL for tasks assessed/performed Overall Cognitive Status: Within Functional  Limits for tasks assessed                                     General Comments  No dizziness reported today.    Exercises     Shoulder Instructions      Home Living Family/patient expects to be discharged to:: Private residence Living Arrangements: Children Available Help at Discharge: Family;Available 24 hours/day Type of Home: House Home Access: Stairs to enter [employer] of Steps: 1+1   Home Layout: One level     Bathroom Shower/Tub: Occupational psychologist:  Standard     Home Equipment: Radio producer - single point   Additional Comments: lives in apartment attached to daughter's home      Prior Functioning/Environment Level of Independence: Independent                 OT Problem List: Impaired vision/perception      OT Treatment/Interventions:      OT Goals(Current goals can be found in the care plan section) Acute Rehab OT Goals Patient Stated Goal: to go home OT Goal Formulation: With patient  OT Frequency:     Barriers to D/C:            Co-evaluation              AM-PAC OT "6 Clicks" Daily Activity     Outcome Measure Help from another person eating meals?: None Help from another person taking care of personal grooming?: None Help from another person toileting, which includes using toliet, bedpan, or urinal?: None Help from another person bathing (including washing, rinsing, drying)?: None Help from another person to put on and taking off regular upper body clothing?: None Help from another person to put on and taking off regular lower body clothing?: None 6 Click Score: 24   End of Session Equipment Utilized During Treatment: Gait belt Nurse Communication: Mobility status  Activity Tolerance: Patient tolerated treatment well Patient left: in bed;with call bell/phone within reach  OT Visit Diagnosis: Other abnormalities of gait and mobility (R26.89)                Time: [id number] OT Time Calculation (min): 26 min Charges:  OT General Charges $OT Visit: 1 Visit OT Evaluation $OT Eval Moderate Complexity: 1 Mod OT Treatments $Self Care/Home Management : 8-22 mins  Darryl Nestle) Marsa Aris OTR/L Acute Rehabilitation Services Pager: [phone] Office: [place] 12/25/2018, 3:11 PM

## 2018-12-26 LAB — GLUCOSE, CAPILLARY
Glucose-Capillary: 129 mg/dL — ABNORMAL HIGH (ref 70–99)
Glucose-Capillary: 148 mg/dL — ABNORMAL HIGH (ref 70–99)

## 2018-12-26 LAB — PROTIME-INR
INR: 2.1 — ABNORMAL HIGH (ref 0.8–1.2)
Prothrombin Time: 23.3 seconds — ABNORMAL HIGH (ref 11.4–15.2)

## 2018-12-26 MED ORDER — WARFARIN SODIUM 3 MG PO TABS: 6.0000 mg | ORAL_TABLET | Freq: Once | ORAL | Status: DC

## 2018-12-26 NOTE — Discharge Summary (Signed)
Physician Discharge Summary  Jeanette Yates [id number] DOB: 1940/08/23 DOA: 12/23/2018  PCP: Gayland Curry, DO  Admit date: 12/23/2018 Discharge date: 12/26/2018  Admitted From: Home Disposition: Home  Recommendations for Outpatient Follow-up:  1. Follow up with PCP in 1-2 weeks 2. Follow with cardiology in 1 to 2 weeks 3. Follow with Coumadin clinic for INR check in 1 to 2 weeks 4. Please obtain BMP/CBC in one week 5. Please follow up on the following pending results:  Home Health: Yes Equipment/Devices: None  Discharge Condition: Stable CODE STATUS: Full code Diet recommendation: Cardiac  Subjective: Seen and examined today.  No complaints.  No more dizziness or any shortness of breath.  Brief/Interim Summary: Jeanette Yates a 78 y.o.femalewith medical history significant ofA.Fib on coumadin, CAD s/p CABG, HFpEF. Patient presented to the ED with c/o dizziness, Sensation of room spinning. This has been intermittently ongoing for the past month or two. No obvious pattern of onset. She has had vertigo in the past that was attributed to an inner ear issue, but spontaneously resolved. During dizziness episodes she feels off balance, feels like she is going to falland needs to sit down. Has also noticed bilateral blurry vision that last sometimes 1 to 2 hours after the dizziness resolves. Daughter has checked her blood pressure and her CBGs during these episodes. Her BPs have been in the 0000000 to [id number] systolic and her CBGs have been in the 100s to 250s. She just started Jardiance for diabetesyesterdaymorning.  Upon arrival to ED, MRI of brain was done which showed punctate R frontal acute stroke and patient was in atrial fibrillation with rapid ventricular response. Also her COVID has come back positive.  She was then admitted under [facility].  Neurology was consulted.  Carotid Doppler unremarkable.  Transthoracic echo did not show any PFO however it showed ejection fraction of 35%.   She also came in with atrial fibrillation with RVR requiring Cardizem drip for a brief period of time which was then discontinued once her heart rate was controlled and her beta-blocker p.o. was increased.  P.o. was later, she went into atrial fibrillation with RVR again and required Cardizem drip again.  Has reduced ejection fraction of 35% seemed to be new so for that reason and for A. fib control, cardiology was consulted however her A. fib was controlled on Cardizem drip which was discontinued and we found out later that she had a reduced ejection fraction of 35% back in 2015 which improved in 2017.  Cardiology did not recommend any further interventions or any changes in medication.  They recommended continuing her home dose of low-dose beta-blocker.  Her INR yesterday was 1.9.  They recommended keeping her in the hospital until INR becomes therapeutic and now it is 2.1.  She was cleared by neurology yesterday to be discharged without any new recommendations.  Now that she has been cleared by both consultants so she is going to be discharged in a stable condition and having no symptoms.  She did not have any symptoms regarding COVID-19 infection.  She had several questions which I answered to the best of my knowledge and to the best of her satisfaction.  She is well aware about what she needs to do in order to prevent the spread of infection at home to her family members and in the community.  Discharge Diagnoses:  Principal Problem:   Acute ischemic stroke [facility]) Active Problems:   Essential hypertension   Type 2 diabetes mellitus with complication,  without long-term current use of insulin (HCC)   Atrial fibrillation ([place])   Hyperlipidemia associated with type 2 diabetes mellitus ([place])   COVID-19 virus infection   Stroke [facility])    Discharge Instructions  Discharge Instructions    Discharge patient   Complete by: As directed    Discharge disposition: 01-Home or Self Care   Discharge patient  date: 12/26/2018     Allergies as of 12/26/2018      Reactions   Penicillins Anaphylaxis, Other (See Comments)   Has patient had a PCN reaction causing immediate rash, facial/tongue/throat swelling, SOB or lightheadedness with hypotension: Yes Has patient had a PCN reaction causing severe rash involving mucus membranes or skin necrosis: Yes Has patient had a PCN reaction that required hospitalization Yes Has patient had a PCN reaction occurring within the last 10 years: No If all of the above answers are "NO", then may proceed with Cephalosporin use.   Latex Other (See Comments)   Redness and rash   Morphine And Related Nausea Only, Other (See Comments)   Dizziness   Tape Other (See Comments)   Redness and rash   Iodine Other (See Comments)   Redness and rash      Medication List    TAKE these medications   acetaminophen 650 MG CR tablet Commonly known as: TYLENOL Take 650-1,300 mg by mouth every 8 (eight) hours as needed for pain.   amLODipine 5 MG tablet Commonly known as: NORVASC Take 1 tablet (5 mg total) by mouth daily. What changed: when to take this   aspirin EC 81 MG tablet Take 81 mg by mouth every morning.   atorvastatin 80 MG tablet Commonly known as: LIPITOR TAKE 1 TABLET (80 MG TOTAL) BY MOUTH DAILY.   calcium citrate-vitamin D 315-200 MG-UNIT tablet Commonly known as: CITRACAL+D Take 1 tablet by mouth 2 (two) times daily.   cetirizine 10 MG chewable tablet Commonly known as: ZYRTEC Chew 10 mg by mouth daily.   empagliflozin 10 MG Tabs tablet Commonly known as: JARDIANCE Take 10 mg by mouth daily before supper. What changed: when to take this   fluticasone 50 MCG/ACT nasal spray Commonly known as: FLONASE USE 2 SPRAYS IN EACH NOSTRIL EVERY DAY   FORTIFY DAILY PROBIOTIC PO Take 1 capsule by mouth daily.   FreeStyle Libre 14 Day Reader Kerrin Mo 1 application by Does not apply route 3 (three) times daily.   FreeStyle Libre 14 Day Sensor Misc 1  application by Does not apply route 3 (three) times daily.   furosemide 20 MG tablet Commonly known as: LASIX TAKE 1 TABLET (20 MG TOTAL) BY MOUTH DAILY.   gabapentin 100 MG capsule Commonly known as: NEURONTIN Take 100 mg by mouth See admin instructions. Taking 2 capsules in morning and 3 capsules at night   Imodium A-D 2 MG tablet Generic drug: loperamide Take 2 mg by mouth as needed for diarrhea or loose stools.   lisinopril 20 MG tablet Commonly known as: ZESTRIL Take 1 tablet (20 mg total) by mouth daily.   metFORMIN 1000 MG tablet Commonly known as: GLUCOPHAGE Take 1 tablet (1,000 mg total) by mouth 2 (two) times daily with a meal.   metoprolol tartrate 25 MG tablet Commonly known as: LOPRESSOR TAKE 1/2 TABLET TWO TIMES DAILY. What changed: See the new instructions.   potassium chloride 10 MEQ tablet Commonly known as: KLOR-CON TAKE 1 TABLET EVERY DAY   tobramycin 0.3 % ophthalmic solution Commonly known as: TOBREX Place 1 drop into  both eyes See admin instructions. Place 1 drop in both eyes four times daily the day before, day of, and day after eye injections.   Vitamin D (Ergocalciferol) 1.25 MG (50000 UT) Caps capsule Commonly known as: DRISDOL Take 1 capsule (50,000 Units total) by mouth every 7 (seven) days. What changed: when to take this   warfarin 6 MG tablet Commonly known as: COUMADIN Take as directed. If you are unsure how to take this medication, talk to your nurse or doctor. Original instructions: TAKE 1 TABLET EVERY DAY What changed:   when to take this  additional instructions      Follow-up Information    Thompson Grayer, MD Follow up.   Specialty: Cardiology Why: Appointment with electrophysiology heart specialist on 02/18/2019 at 3:45 PM. Contact information: [place] [address] [phone]        Arnoldo Lenis, MD Follow up.   Specialty: Cardiology Why: Cardiology office follow-up on 01/30/2019 at  2:40 PM. Contact information: [place] [address] [place] Follow up.   Specialty: Cardiology Why: INR check for Coumadin therapy on 01/08/2019 at 12:00. Contact information: [place], [facility] Follow up.   Specialty: Home Health Services Why: [facility] will provide home health services. They will call you to schedule start of services. Contact information: [address] [place] [address] [phone]        Hollace Kinnier L, DO Follow up in 1 week(s).   Specialty: Geriatric Medicine Contact information: [place]. [place] [address] [phone]        Arnoldo Lenis, MD .   Specialty: Cardiology Contact information: [place] [address] [phone]          Allergies  Allergen Reactions  . Penicillins Anaphylaxis and Other (See Comments)    Has patient had a PCN reaction causing immediate rash, facial/tongue/throat swelling, SOB or lightheadedness with hypotension: Yes Has patient had a PCN reaction causing severe rash involving mucus membranes or skin necrosis: Yes Has patient had a PCN reaction that required hospitalization Yes Has patient had a PCN reaction occurring within the last 10 years: No If all of the above answers are "NO", then may proceed with Cephalosporin use.   . Latex Other (See Comments)    Redness and rash  . Morphine And Related Nausea Only and Other (See Comments)    Dizziness  . Tape Other (See Comments)    Redness and rash  . Iodine Other (See Comments)    Redness and rash    Consultations: Cardiology and neurology   Procedures/Studies: Mr Angio Head Wo Contrast  Result Date: 12/24/2018 CLINICAL DATA:  Persistent central vertigo EXAM: MRI HEAD WITHOUT CONTRAST MRA HEAD WITHOUT CONTRAST TECHNIQUE: Multiplanar, multiecho  pulse sequences of the brain and surrounding structures were obtained without intravenous contrast. Angiographic images of the head were obtained using MRA technique without contrast. COMPARISON:  05/02/2015 FINDINGS: MRI HEAD FINDINGS Brain: Punctate acute infarct along posterior right frontal cortex. Moderate size remote right parietal infarct affecting cortex and white matter. Remote lacunar infarct in the right corona radiata. Stable cerebral volume loss which is nonspecific. Mild ischemic gliosis mainly in the periventricular white matter. Coapted frontal horns of the lateral ventricles, incidental. No hemorrhage, hydrocephalus, collection,  or masslike finding. Vascular: Normal flow voids Skull and upper cervical spine: Negative for marrow lesion Sinuses/Orbits: Negative MRA HEAD FINDINGS Mild blurring at superior slices is attributed to motion. No branch occlusion, flow limiting stenosis, beading, or aneurysm. There is mild medium vessel atherosclerotic irregularity, but limited by described artifact. IMPRESSION: Brain MRI: 1. Punctate acute infarct in the right frontal cortex. 2. The otherwise stable from 2017, including remote right-sided lacunar and parietal infarcts. MRA: 1. Motion degraded study with no emergent or interval finding. 2. Mild atheromatous changes of medium size vessels. Electronically Signed   By: Monte Fantasia M.D.   On: 12/24/2018 04:37   Mr Brain Wo Contrast  Result Date: 12/24/2018 CLINICAL DATA:  Persistent central vertigo EXAM: MRI HEAD WITHOUT CONTRAST MRA HEAD WITHOUT CONTRAST TECHNIQUE: Multiplanar, multiecho pulse sequences of the brain and surrounding structures were obtained without intravenous contrast. Angiographic images of the head were obtained using MRA technique without contrast. COMPARISON:  05/02/2015 FINDINGS: MRI HEAD FINDINGS Brain: Punctate acute infarct along posterior right frontal cortex. Moderate size remote right parietal infarct affecting cortex and  white matter. Remote lacunar infarct in the right corona radiata. Stable cerebral volume loss which is nonspecific. Mild ischemic gliosis mainly in the periventricular white matter. Coapted frontal horns of the lateral ventricles, incidental. No hemorrhage, hydrocephalus, collection, or masslike finding. Vascular: Normal flow voids Skull and upper cervical spine: Negative for marrow lesion Sinuses/Orbits: Negative MRA HEAD FINDINGS Mild blurring at superior slices is attributed to motion. No branch occlusion, flow limiting stenosis, beading, or aneurysm. There is mild medium vessel atherosclerotic irregularity, but limited by described artifact. IMPRESSION: Brain MRI: 1. Punctate acute infarct in the right frontal cortex. 2. The otherwise stable from 2017, including remote right-sided lacunar and parietal infarcts. MRA: 1. Motion degraded study with no emergent or interval finding. 2. Mild atheromatous changes of medium size vessels. Electronically Signed   By: Monte Fantasia M.D.   On: 12/24/2018 04:37   Dg Chest Port 1 View  Result Date: 12/24/2018 CLINICAL DATA:  Atrial fibrillation EXAM: PORTABLE CHEST 1 VIEW COMPARISON:  06/18/2017 FINDINGS: Cardiomegaly. Prior median sternotomy for CABG, aortic valve replacement, and mitral valve replacement. History of COVID-19 positivity. Interstitial coarsening is stable. There is no edema, consolidation, effusion, or pneumothorax. Generalized osteopenia IMPRESSION: Stable exam.  No acute finding. Electronically Signed   By: Monte Fantasia M.D.   On: 12/24/2018 06:30   Vas US Carotid (at [place] Only)  Result Date: 12/25/2018 Carotid Arterial Duplex Study Indications:      CVA. Risk Factors:     Hypertension, hyperlipidemia, Diabetes, coronary artery                   disease. Comparison Study: no prior Performing Technologist: Abram Sander RVS  Examination Guidelines: A complete evaluation includes B-mode imaging, spectral Doppler, color Doppler, and power  Doppler as needed of all accessible portions of each vessel. Bilateral testing is considered an integral part of a complete examination. Limited examinations for reoccurring indications may be performed as noted.  Right Carotid Findings: +----------+--------+--------+--------+-------------------------+--------+           PSV cm/sEDV cm/sStenosisPlaque Description       Comments +----------+--------+--------+--------+-------------------------+--------+ CCA Prox  56      16              heterogenous                      +----------+--------+--------+--------+-------------------------+--------+ CCA Distal38  9               heterogenous                      +----------+--------+--------+--------+-------------------------+--------+ ICA Prox  68      16      1-39%   calcific and heterogenous         +----------+--------+--------+--------+-------------------------+--------+ ICA Distal68      20                                                +----------+--------+--------+--------+-------------------------+--------+ ECA       83                                                        +----------+--------+--------+--------+-------------------------+--------+ +----------+--------+-------+--------+-------------------+           PSV cm/sEDV cmsDescribeArm Pressure (mmHG) +----------+--------+-------+--------+-------------------+ [id number]                                        +----------+--------+-------+--------+-------------------+ +---------+--------+--+--------+--+---------+ VertebralPSV cm/s55EDV cm/s15Antegrade +---------+--------+--+--------+--+---------+  Left Carotid Findings: +----------+--------+--------+--------+------------------+--------+           PSV cm/sEDV cm/sStenosisPlaque DescriptionComments +----------+--------+--------+--------+------------------+--------+ CCA Prox  47      11              heterogenous                +----------+--------+--------+--------+------------------+--------+ CCA Distal41      9               heterogenous               +----------+--------+--------+--------+------------------+--------+ ICA Prox  105     14      1-39%   heterogenous               +----------+--------+--------+--------+------------------+--------+ ICA Distal107     26                                         +----------+--------+--------+--------+------------------+--------+ ECA       155                                                +----------+--------+--------+--------+------------------+--------+ +----------+--------+--------+--------+-------------------+           PSV cm/sEDV cm/sDescribeArm Pressure (mmHG) +----------+--------+--------+--------+-------------------+ [id number]                                          +----------+--------+--------+--------+-------------------+ +---------+--------+--+--------+--+---------+ VertebralPSV cm/s45EDV cm/s14Antegrade +---------+--------+--+--------+--+---------+  Summary: Right Carotid: Velocities in the right ICA are consistent with a 1-39% stenosis. Left Carotid: Velocities in the left ICA are consistent with a 1-39% stenosis. Vertebrals: Bilateral vertebral arteries demonstrate antegrade flow. *See table(s) above for measurements and observations.  Electronically signed by Antony Contras MD  on 12/25/2018 at 1:08:06 PM.    Final       Discharge Exam: Vitals:   12/25/18 2026 12/25/18 2152  BP: 126/80   Pulse: 96 86  Resp:    Temp: (!) 97.5 F (36.4 C)   SpO2: 96%    Vitals:   12/25/18 1042 12/25/18 1500 12/25/18 2026 12/25/18 2152  BP: (!) 143/116 124/78 126/80   Pulse: (!) 104 99 96 86  Resp:      Temp:  98 F (36.7 C) (!) 97.5 F (36.4 C)   TempSrc:  Oral Oral   SpO2:  98% 96%   Weight:      Height:        General: Pt is alert, awake, not in acute distress Cardiovascular: RRR, S1/S2 +, no rubs, no  gallops Respiratory: CTA bilaterally, no wheezing, no rhonchi Abdominal: Soft, NT, ND, bowel sounds + Extremities: no edema, no cyanosis    The results of significant diagnostics from this hospitalization (including imaging, microbiology, ancillary and laboratory) are listed below for reference.     Microbiology: Recent Results (from the past 240 hour(s))  SARS Coronavirus 2 [facility] order, Performed in [facility] hospital lab) Nasopharyngeal Nasopharyngeal Swab     Status: Abnormal   Collection Time: 12/23/18  8:15 PM   Specimen: Nasopharyngeal Swab  Result Value Ref Range Status   SARS Coronavirus 2 POSITIVE (A) NEGATIVE Final    Comment: RESULT CALLED TO, READ BACK BY AND VERIFIED WITH: M DOSS,RN @2150  12/23/18 MKELLY (NOTE) If result is NEGATIVE SARS-CoV-2 target nucleic acids are NOT DETECTED. The SARS-CoV-2 RNA is generally detectable in upper and lower  respiratory specimens during the acute phase of infection. The lowest  concentration of SARS-CoV-2 viral copies this assay can detect is 250  copies / mL. A negative result does not preclude SARS-CoV-2 infection  and should not be used as the sole basis for treatment or other  patient management decisions.  A negative result may occur with  improper specimen collection / handling, submission of specimen other  than nasopharyngeal swab, presence of viral mutation(s) within the  areas targeted by this assay, and inadequate number of viral copies  (<250 copies / mL). A negative result must be combined with clinical  observations, patient history, and epidemiological information. If result is POSITIVE SARS-CoV-2 target nucleic acids are DETECTED. The S ARS-CoV-2 RNA is generally detectable in upper and lower  respiratory specimens during the acute phase of infection.  Positive  results are indicative of active infection with SARS-CoV-2.  Clinical  correlation with patient history and other diagnostic information is   necessary to determine patient infection status.  Positive results do  not rule out bacterial infection or co-infection with other viruses. If result is PRESUMPTIVE POSTIVE SARS-CoV-2 nucleic acids MAY BE PRESENT.   A presumptive positive result was obtained on the submitted specimen  and confirmed on repeat testing.  While 2019 novel coronavirus  (SARS-CoV-2) nucleic acids may be present in the submitted sample  additional confirmatory testing may be necessary for epidemiological  and / or clinical management purposes  to differentiate between  SARS-CoV-2 and other Sarbecovirus currently known to infect humans.  If clinically indicated additional testing with an alternate test  methodology [phone]) is adv ised. The SARS-CoV-2 RNA is generally  detectable in upper and lower respiratory specimens during the acute  phase of infection. The expected result is Negative. Fact Sheet for Patients:  StrictlyIdeas.no Fact Sheet for Healthcare Providers: BankingDealers.co.za This test is  not yet approved or cleared by the [place] and has been authorized for detection and/or diagnosis of SARS-CoV-2 by FDA under an Emergency Use Authorization (EUA).  This EUA will remain in effect (meaning this test can be used) for the duration of the COVID-19 declaration under Section 564(b)(1) of the Act, 21 U.S.C. section 360bbb-3(b)(1), unless the authorization is terminated or revoked sooner. Performed at [facility], [address] [place], [place] [address]   Urine culture     Status: Abnormal   Collection Time: 12/24/18  1:53 AM   Specimen: Urine, Random  Result Value Ref Range Status   Specimen Description URINE, RANDOM  Final   Special Requests   Final    NONE Performed at [facility], [place] [address], [place], [place] [address]    Culture MULTIPLE SPECIES PRESENT, SUGGEST RECOLLECTION (A)  Final   Report Status 12/25/2018 FINAL   Final  MRSA PCR Screening     Status: None   Collection Time: 12/24/18  6:54 AM   Specimen: Nasopharyngeal  Result Value Ref Range Status   MRSA by PCR NEGATIVE NEGATIVE Final    Comment:        The GeneXpert MRSA Assay (FDA approved for NASAL specimens only), is one component of a comprehensive MRSA colonization surveillance program. It is not intended to diagnose MRSA infection nor to guide or monitor treatment for MRSA infections. Performed at [facility], [place] [address] [place], [place] [address]      Labs: BNP (last 3 results) No results for input(s): BNP in the last 8760 hours. Basic Metabolic Panel: Recent Labs  Lab 12/23/18 1558 12/24/18 0644  NA 137 139  K 4.1 3.6  CL 106 107  CO2 22 20*  GLUCOSE 147* 220*  BUN 25* 18  CREATININE 0.92 0.97  CALCIUM 9.3 9.0   Liver Function Tests: No results for input(s): AST, ALT, ALKPHOS, BILITOT, PROT, ALBUMIN in the last 168 hours. No results for input(s): LIPASE, AMYLASE in the last 168 hours. No results for input(s): AMMONIA in the last 168 hours. CBC: Recent Labs  Lab 12/23/18 1558 12/24/18 0644  WBC 10.3 10.5  NEUTROABS  --  7.2  HGB 12.8 12.2  HCT 41.2 37.1  MCV 89.4 87.7  PLT 422* 375   Cardiac Enzymes: No results for input(s): CKTOTAL, CKMB, CKMBINDEX, TROPONINI in the last 168 hours. BNP: Invalid input(s): POCBNP CBG: Recent Labs  Lab 12/25/18 0911 12/25/18 1207 12/25/18 1608 12/25/18 2258 12/26/18 0740  GLUCAP 108* 182* 149* 241* 129*   D-Dimer Recent Labs    12/24/18 0644  DDIMER <0.27   Hgb A1c Recent Labs    12/25/18 0504  HGBA1C 7.5*   Lipid Profile Recent Labs    12/25/18 0504  CHOL 120  HDL 39*  LDLCALC 57  TRIG 120  CHOLHDL 3.1   Thyroid function studies No results for input(s): TSH, T4TOTAL, T3FREE, THYROIDAB in the last 72 hours.  Invalid input(s): FREET3 Anemia work up No results for input(s): VITAMINB12, FOLATE, FERRITIN, TIBC, IRON, RETICCTPCT in the  last 72 hours. Urinalysis    Component Value Date/Time   COLORURINE YELLOW 12/23/2018 1704   APPEARANCEUR HAZY (A) 12/23/2018 1704   LABSPEC 1.017 12/23/2018 1704   PHURINE 5.0 12/23/2018 1704   GLUCOSEU >=500 (A) 12/23/2018 1704   HGBUR SMALL (A) 12/23/2018 1704   BILIRUBINUR NEGATIVE 12/23/2018 1704   KETONESUR NEGATIVE 12/23/2018 1704   PROTEINUR NEGATIVE 12/23/2018 1704   NITRITE POSITIVE (  A) 12/23/2018 1704   LEUKOCYTESUR TRACE (A) 12/23/2018 1704   Sepsis Labs Invalid input(s): PROCALCITONIN,  WBC,  LACTICIDVEN Microbiology Recent Results (from the past 240 hour(s))  SARS Coronavirus 2 [facility] order, Performed in [facility] hospital lab) Nasopharyngeal Nasopharyngeal Swab     Status: Abnormal   Collection Time: 12/23/18  8:15 PM   Specimen: Nasopharyngeal Swab  Result Value Ref Range Status   SARS Coronavirus 2 POSITIVE (A) NEGATIVE Final    Comment: RESULT CALLED TO, READ BACK BY AND VERIFIED WITH: M DOSS,RN @2150  12/23/18 MKELLY (NOTE) If result is NEGATIVE SARS-CoV-2 target nucleic acids are NOT DETECTED. The SARS-CoV-2 RNA is generally detectable in upper and lower  respiratory specimens during the acute phase of infection. The lowest  concentration of SARS-CoV-2 viral copies this assay can detect is 250  copies / mL. A negative result does not preclude SARS-CoV-2 infection  and should not be used as the sole basis for treatment or other  patient management decisions.  A negative result may occur with  improper specimen collection / handling, submission of specimen other  than nasopharyngeal swab, presence of viral mutation(s) within the  areas targeted by this assay, and inadequate number of viral copies  (<250 copies / mL). A negative result must be combined with clinical  observations, patient history, and epidemiological information. If result is POSITIVE SARS-CoV-2 target nucleic acids are DETECTED. The S ARS-CoV-2 RNA is generally detectable in upper and  lower  respiratory specimens during the acute phase of infection.  Positive  results are indicative of active infection with SARS-CoV-2.  Clinical  correlation with patient history and other diagnostic information is  necessary to determine patient infection status.  Positive results do  not rule out bacterial infection or co-infection with other viruses. If result is PRESUMPTIVE POSTIVE SARS-CoV-2 nucleic acids MAY BE PRESENT.   A presumptive positive result was obtained on the submitted specimen  and confirmed on repeat testing.  While 2019 novel coronavirus  (SARS-CoV-2) nucleic acids may be present in the submitted sample  additional confirmatory testing may be necessary for epidemiological  and / or clinical management purposes  to differentiate between  SARS-CoV-2 and other Sarbecovirus currently known to infect humans.  If clinically indicated additional testing with an alternate test  methodology [phone]) is adv ised. The SARS-CoV-2 RNA is generally  detectable in upper and lower respiratory specimens during the acute  phase of infection. The expected result is Negative. Fact Sheet for Patients:  StrictlyIdeas.no Fact Sheet for Healthcare Providers: BankingDealers.co.za This test is not yet approved or cleared by the [place] FDA and has been authorized for detection and/or diagnosis of SARS-CoV-2 by FDA under an Emergency Use Authorization (EUA).  This EUA will remain in effect (meaning this test can be used) for the duration of the COVID-19 declaration under Section 564(b)(1) of the Act, 21 U.S.C. section 360bbb-3(b)(1), unless the authorization is terminated or revoked sooner. Performed at [facility], [address] [place], [place] [address]   Urine culture     Status: Abnormal   Collection Time: 12/24/18  1:53 AM   Specimen: Urine, Random  Result Value Ref Range Status   Specimen Description URINE, RANDOM  Final    Special Requests   Final    NONE Performed at [facility], [place] [address] [place], [place] [address]    Culture MULTIPLE SPECIES PRESENT, SUGGEST RECOLLECTION (A)  Final   Report Status 12/25/2018 FINAL  Final  MRSA PCR Screening  Status: None   Collection Time: 12/24/18  6:54 AM   Specimen: Nasopharyngeal  Result Value Ref Range Status   MRSA by PCR NEGATIVE NEGATIVE Final    Comment:        The GeneXpert MRSA Assay (FDA approved for NASAL specimens only), is one component of a comprehensive MRSA colonization surveillance program. It is not intended to diagnose MRSA infection nor to guide or monitor treatment for MRSA infections. Performed at [facility], [place] [address], [place], [place] [address]      Time coordinating discharge: Over 30 minutes  SIGNED:   Darliss Cheney, MD  [facility] 12/26/2018, 9:26 AM Pager [phone]  If 7PM-7AM, please contact night-coverage [email or web address] Password [email or web address]

## 2018-12-26 NOTE — TOC Transition Note (Signed)
Transition of Care [facility] - CM/SW Discharge Note   Patient Details  Name: Jeanette Yates MRN: [id number] Date of Birth: 1940/04/01  Transition of Care [facility]) CM/SW Contact:  Geralynn Ochs, LCSW Phone Number: 12/26/2018, 11:27 AM   Clinical Narrative:   Patient DC home today, home health set up yesterday. No further needs identified.    Final next level of care: Home w Home Health Services Barriers to Discharge: Barriers Resolved   Patient Goals and CMS Choice   CMS Medicare.gov Compare Post Acute Care list provided to:: Patient Represenative (must comment) Choice offered to / list presented to : Adult Children  Discharge Placement                Patient to be transferred to facility by: Family car      Discharge Plan and Services     Post Acute Care Choice: Home Health                    HH Arranged: PT [place]: [place] Date [place]: 12/25/18 Time [place]: 1519 Representative spoke with at [place]: Tommi Rumps  Social Determinants of Health ([place]) Interventions     Readmission Risk Interventions No flowsheet data found.

## 2018-12-26 NOTE — Progress Notes (Signed)
[place] for Coumadin Indication: atrial fibrillation  Allergies  Allergen Reactions  . Penicillins Anaphylaxis and Other (See Comments)    Has patient had a PCN reaction causing immediate rash, facial/tongue/throat swelling, SOB or lightheadedness with hypotension: Yes Has patient had a PCN reaction causing severe rash involving mucus membranes or skin necrosis: Yes Has patient had a PCN reaction that required hospitalization Yes Has patient had a PCN reaction occurring within the last 10 years: No If all of the above answers are "NO", then may proceed with Cephalosporin use.   . Latex Other (See Comments)    Redness and rash  . Morphine And Related Nausea Only and Other (See Comments)    Dizziness  . Tape Other (See Comments)    Redness and rash  . Iodine Other (See Comments)    Redness and rash    Patient Measurements: Height: 5\' 2"  (QA348G cm) Weight: 165 lb 9.1 oz (75.1 kg) IBW/kg (Calculated) : 50.1  Vital Signs: Temp: 97.5 F (36.4 C) (10/01 2026) Temp Source: Oral (10/01 2026) BP: 126/80 (10/01 2026) Pulse Rate: 86 (10/01 2152)  Labs: Recent Labs    12/23/18 1558 12/23/18 1730 12/24/18 0644 12/25/18 0504 12/26/18 0549  HGB 12.8  --  12.2  --   --   HCT 41.2  --  37.1  --   --   PLT 422*  --  375  --   --   APTT 37*  --   --   --   --   LABPROT 22.6*  --  18.9* 21.6* 23.3*  INR 2.0*  --  1.6* 1.9* 2.1*  CREATININE 0.92  --  0.97  --   --   TROPONINIHS 10 12  --   --   --     Estimated Creatinine Clearance: 46.8 mL/min (by C-G formula based on SCr of 0.97 mg/dL).   Medical History: Past Medical History:  Diagnosis Date  . Adjustment disorder 10/17/2005  . Atrial fibrillation ([place])   . Cataract    bilateral  . CHF (congestive heart failure) ([place])   . Contracture of knee joint 07/26/2009  . Coronary artery disease   . Degeneration of lumbar or lumbosacral intervertebral disc 07/26/2009  . Diabetes mellitus  without complication ([place])   . Esophageal spasm   . Fibromyalgia   . Hearing loss 12/18/2010  . Heart murmur   . Hypercholesteremia   . Hypertension   . 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 Leaky heart valve   . Lyme disease   . Macular degeneration of both eyes   . Migraine 10/17/2005  . Mixed incontinence 10/17/2005  . Osteoarthritis    multiple joints   . Overweight 09/19/2010  . Pneumonia   . PONV (postoperative nausea and vomiting)   . Postartificial menopausal syndrome 10/10/2009  . Prinzmetal angina ([place]) 10/17/2005  . Psoriasis 06/12/2006  . Sciatica   . TIA (transient ischemic attack)   . Type II diabetes mellitus ([place])   . Urolith 10/17/2005    Medications:  No current facility-administered medications on file prior to encounter.    Current Outpatient Medications on File Prior to Encounter  Medication Sig Dispense Refill  . acetaminophen (TYLENOL) 650 MG CR tablet Take 650-1,300 mg by mouth every 8 (eight) hours as needed for pain.     Marland Kitchen amLODipine (NORVASC) 5 MG tablet Take 1 tablet (5 mg total) by mouth daily. (Patient taking differently: Take 5 mg by  mouth at bedtime. ) 90 tablet 1  . aspirin EC 81 MG tablet Take 81 mg by mouth every morning.     Marland Kitchen atorvastatin (LIPITOR) 80 MG tablet TAKE 1 TABLET (80 MG TOTAL) BY MOUTH DAILY. 90 tablet 1  . calcium citrate-vitamin D (CITRACAL+D) 315-200 MG-UNIT tablet Take 1 tablet by mouth 2 (two) times daily.    . cetirizine (ZYRTEC) 10 MG chewable tablet Chew 10 mg by mouth daily.    . empagliflozin (JARDIANCE) 10 MG TABS tablet Take 10 mg by mouth daily before supper. (Patient taking differently: Take 10 mg by mouth daily. ) 30 tablet 0  . fluticasone (FLONASE) 50 MCG/ACT nasal spray USE 2 SPRAYS IN EACH NOSTRIL EVERY DAY (Patient taking differently: Place 2 sprays into both nostrils daily. ) 48 g 0  . furosemide (LASIX) 20 MG tablet TAKE 1 TABLET (20 MG TOTAL) BY MOUTH DAILY. 90 tablet 1  .  gabapentin (NEURONTIN) 100 MG capsule Take 100 mg by mouth See admin instructions. Taking 2 capsules in morning and 3 capsules at night    . lisinopril (ZESTRIL) 20 MG tablet Take 1 tablet (20 mg total) by mouth daily. 90 tablet 1  . loperamide (IMODIUM A-D) 2 MG tablet Take 2 mg by mouth as needed for diarrhea or loose stools.    . metFORMIN (GLUCOPHAGE) 1000 MG tablet Take 1 tablet (1,000 mg total) by mouth 2 (two) times daily with a meal. 180 tablet 3  . metoprolol tartrate (LOPRESSOR) 25 MG tablet TAKE 1/2 TABLET TWO TIMES DAILY. (Patient taking differently: Take 12.5 mg by mouth See admin instructions. Take 12.5mg  twice daily, may take one additional 12.5mg  as needed for persistent arrhthymias.) 90 tablet 0  . potassium chloride (K-DUR) 10 MEQ tablet TAKE 1 TABLET EVERY DAY (Patient taking differently: Take 10 mEq by mouth daily. ) 90 tablet 1  . Probiotic Product (FORTIFY DAILY PROBIOTIC PO) Take 1 capsule by mouth daily.     Marland Kitchen tobramycin (TOBREX) 0.3 % ophthalmic solution Place 1 drop into both eyes See admin instructions. Place 1 drop in both eyes four times daily the day before, day of, and day after eye injections.    . Vitamin D, Ergocalciferol, (DRISDOL) 50000 units CAPS capsule Take 1 capsule (50,000 Units total) by mouth every 7 (seven) days. (Patient taking differently: Take 50,000 Units by mouth every Sunday. ) 12 capsule 3  . warfarin (COUMADIN) 6 MG tablet TAKE 1 TABLET EVERY DAY (Patient taking differently: Take 6 mg by mouth See admin instructions. Take 6mg  daily except SUN take 3mg .) 90 tablet 3  . Continuous Blood Gluc Receiver (FREESTYLE LIBRE 14 DAY READER) DEVI 1 application by Does not apply route 3 (three) times daily. 1 Device 5  . Continuous Blood Gluc Sensor (FREESTYLE LIBRE 14 DAY SENSOR) MISC 1 application by Does not apply route 3 (three) times daily. 1 each 5     Assessment: 78 y.o. female admitted with CVA, h/o Afib, to continue Coumadin. INR 1.6 on admit, now  therapeuitc s/p boosted 1 dose 9/30 and home dose continued 10/1. CBC wnl. No active bleed issues documented.  Goal of Therapy:  INR 2-3 Monitor platelets by anticoagulation protocol: Yes   Plan:  Warfarin 6mg  PO x 1 - resuming home dose Monitor daily INR, CBC, s/sx bleeding   Elicia Lamp, PharmD, BCPS Please check AMION for all [place] contact numbers Clinical Pharmacist 12/26/2018 7:38 AM

## 2018-12-26 NOTE — Discharge Instructions (Signed)
Prevent the Spread of COVID-19 if You Are Sick If you are sick with COVID-19 or think you might have COVID-19, follow the steps below to help protect other people in your home and community. Stay home except to get medical care.  Stay home. Most people with COVID-19 have mild illness and are able to recover at home without medical care. Do not leave your home, except to get medical care. Do not visit public areas.  Take care of yourself. Get rest and stay hydrated.  Get medical care when needed. Call your doctor before you go to their office for care. But, if you have trouble breathing or other concerning symptoms, call 911 for immediate help.  Avoid public transportation, ride-sharing, or taxis. Separate yourself from other people and pets in your home.  As much as possible, stay in a specific room and away from other people and pets in your home. Also, you should use a separate bathroom, if available. If you need to be around other people or animals in or outside of the home, wear a cloth face covering. ? See COVID-19 and Animals if you have questions about pets: https://www.cdc.gov/coronavirus/2019-ncov/faq.html#COVID19animals Monitor your symptoms.  Common symptoms of COVID-19 include fever and cough. Trouble breathing is a more serious symptom that means you should get medical attention.  Follow care instructions from your healthcare provider and local health department. Your local health authorities will give instructions on checking your symptoms and reporting information. If you develop emergency warning signs for COVID-19 get medical attention immediately.  Emergency warning signs include*:  Trouble breathing  Persistent pain or pressure in the chest  New confusion or not able to be woken  Bluish lips or face *This list is not all inclusive. Please consult your medical provider for any other symptoms that are severe or concerning to you. Call 911 if you have a medical  emergency. If you have a medical emergency and need to call 911, notify the operator that you have or think you might have, COVID-19. If possible, put on a facemask before medical help arrives. Call ahead before visiting your doctor.  Call ahead. Many medical visits for routine care are being postponed or done by phone or telemedicine.  If you have a medical appointment that cannot be postponed, call your doctor's office. This will help the office protect themselves and other patients. If you are sick, wear a cloth covering over your nose and mouth.  You should wear a cloth face covering over your nose and mouth if you must be around other people or animals, including pets (even at home).  You don't need to wear the cloth face covering if you are alone. If you can't put on a cloth face covering (because of trouble breathing for example), cover your coughs and sneezes in some other way. Try to stay at least 6 feet away from other people. This will help protect the people around you. Note: During the COVID-19 pandemic, medical grade facemasks are reserved for healthcare workers and some first responders. You may need to make a cloth face covering using a scarf or bandana. Cover your coughs and sneezes.  Cover your mouth and nose with a tissue when you cough or sneeze.  Throw used tissues in a lined trash can.  Immediately wash your hands with soap and water for at least 20 seconds. If soap and water are not available, clean your hands with an alcohol-based hand sanitizer that contains at least 60% alcohol. Clean your hands often.    Wash your hands often with soap and water for at least 20 seconds. This is especially important after blowing your nose, coughing, or sneezing; going to the bathroom; and before eating or preparing food.  Use hand sanitizer if soap and water are not available. Use an alcohol-based hand sanitizer with at least 60% alcohol, covering all surfaces of your hands and rubbing  them together until they feel dry.  Soap and water are the best option, especially if your hands are visibly dirty.  Avoid touching your eyes, nose, and mouth with unwashed hands. Avoid sharing personal household items.  Do not share dishes, drinking glasses, cups, eating utensils, towels, or bedding with other people in your home.  Wash these items thoroughly after using them with soap and water or put them in the dishwasher. Clean all "high-touch" surfaces everyday.  Clean and disinfect high-touch surfaces in your "sick room" and bathroom. Let someone else clean and disinfect surfaces in common areas, but not your bedroom and bathroom.  If a caregiver or other person needs to clean and disinfect a sick person's bedroom or bathroom, they should do so on an as-needed basis. The caregiver/other person should wear a mask and wait as long as possible after the sick person has used the bathroom. High-touch surfaces include phones, remote controls, counters, tabletops, doorknobs, bathroom fixtures, toilets, keyboards, tablets, and bedside tables.  Clean and disinfect areas that may have blood, stool, or body fluids on them.  Use household cleaners and disinfectants. Clean the area or item with soap and water or another detergent if it is dirty. Then use a household disinfectant. ? Be sure to follow the instructions on the label to ensure safe and effective use of the product. Many products recommend keeping the surface wet for several minutes to ensure germs are killed. Many also recommend precautions such as wearing gloves and making sure you have good ventilation during use of the product. ? Most EPA-registered household disinfectants should be effective. How to discontinue home isolation  People with COVID-19 who have stayed home (home isolated) can stop home isolation under the following conditions: ? If you will not have a test to determine if you are still contagious, you can leave home  after these three things have happened:  You have had no fever for at least 72 hours (that is three full days of no fever without the use of medicine that reduces fevers) AND  other symptoms have improved (for example, when your cough or shortness of breath has improved) AND  at least 10 days have passed since your symptoms first appeared. ? If you will be tested to determine if you are still contagious, you can leave home after these three things have happened:  You no longer have a fever (without the use of medicine that reduces fevers) AND  other symptoms have improved (for example, when your cough or shortness of breath has improved) AND  you received two negative tests in a row, 24 hours apart. Your doctor will follow CDC guidelines. In all cases, follow the guidance of your healthcare provider and local health department. The decision to stop home isolation should be made in consultation with your healthcare provider and state and local health departments. Local decisions depend on local circumstances. cdc.gov/coronavirus 07/27/2018 This information is not intended to replace advice given to you by your health care provider. Make sure you discuss any questions you have with your health care provider. Document Released: 07/08/2018 Document Revised: 08/06/2018 Document Reviewed: 07/08/2018   Elsevier Patient Education  2020 [employer] Inc.  

## 2018-12-26 NOTE — Progress Notes (Signed)
Patient alert and well related, continued on telemetry monitoring, Patient with Rate of 100's, afib/.BBB/ and PVC, ST segment 3.5, lead MCL.. patient without distress, OOB to bathroom , independent in activites.Jeanette Yates

## 2018-12-29 DIAGNOSIS — Z7984 Long term (current) use of oral hypoglycemic drugs: Secondary | ICD-10-CM | POA: Diagnosis not present

## 2018-12-29 DIAGNOSIS — E1169 Type 2 diabetes mellitus with other specified complication: Secondary | ICD-10-CM | POA: Diagnosis not present

## 2018-12-29 DIAGNOSIS — I5030 Unspecified diastolic (congestive) heart failure: Secondary | ICD-10-CM

## 2018-12-29 DIAGNOSIS — Z79899 Other long term (current) drug therapy: Secondary | ICD-10-CM | POA: Diagnosis not present

## 2018-12-29 DIAGNOSIS — U071 COVID-19: Secondary | ICD-10-CM | POA: Diagnosis not present

## 2018-12-29 DIAGNOSIS — Z7982 Long term (current) use of aspirin: Secondary | ICD-10-CM | POA: Diagnosis not present

## 2018-12-29 DIAGNOSIS — Z7901 Long term (current) use of anticoagulants: Secondary | ICD-10-CM | POA: Diagnosis not present

## 2018-12-29 DIAGNOSIS — E785 Hyperlipidemia, unspecified: Secondary | ICD-10-CM | POA: Diagnosis not present

## 2018-12-29 DIAGNOSIS — I4891 Unspecified atrial fibrillation: Secondary | ICD-10-CM | POA: Diagnosis not present

## 2018-12-29 DIAGNOSIS — I110 Hypertensive heart disease with heart failure: Secondary | ICD-10-CM | POA: Diagnosis not present

## 2018-12-29 DIAGNOSIS — R42 Dizziness and giddiness: Secondary | ICD-10-CM | POA: Diagnosis not present

## 2018-12-29 DIAGNOSIS — R531 Weakness: Secondary | ICD-10-CM | POA: Diagnosis not present

## 2018-12-29 DIAGNOSIS — Z951 Presence of aortocoronary bypass graft: Secondary | ICD-10-CM | POA: Diagnosis not present

## 2018-12-29 DIAGNOSIS — I2510 Atherosclerotic heart disease of native coronary artery without angina pectoris: Secondary | ICD-10-CM

## 2018-12-29 DIAGNOSIS — I69398 Other sequelae of cerebral infarction: Secondary | ICD-10-CM | POA: Diagnosis not present

## 2019-01-01 DIAGNOSIS — I110 Hypertensive heart disease with heart failure: Secondary | ICD-10-CM | POA: Diagnosis not present

## 2019-01-01 DIAGNOSIS — I69398 Other sequelae of cerebral infarction: Secondary | ICD-10-CM | POA: Diagnosis not present

## 2019-01-01 DIAGNOSIS — U071 COVID-19: Secondary | ICD-10-CM | POA: Diagnosis not present

## 2019-01-01 DIAGNOSIS — R42 Dizziness and giddiness: Secondary | ICD-10-CM | POA: Diagnosis not present

## 2019-01-01 DIAGNOSIS — R531 Weakness: Secondary | ICD-10-CM | POA: Diagnosis not present

## 2019-01-01 DIAGNOSIS — I4891 Unspecified atrial fibrillation: Secondary | ICD-10-CM | POA: Diagnosis not present

## 2019-01-03 DIAGNOSIS — I110 Hypertensive heart disease with heart failure: Secondary | ICD-10-CM | POA: Diagnosis not present

## 2019-01-03 DIAGNOSIS — I69398 Other sequelae of cerebral infarction: Secondary | ICD-10-CM | POA: Diagnosis not present

## 2019-01-03 DIAGNOSIS — R531 Weakness: Secondary | ICD-10-CM | POA: Diagnosis not present

## 2019-01-03 DIAGNOSIS — U071 COVID-19: Secondary | ICD-10-CM | POA: Diagnosis not present

## 2019-01-03 DIAGNOSIS — I4891 Unspecified atrial fibrillation: Secondary | ICD-10-CM | POA: Diagnosis not present

## 2019-01-03 DIAGNOSIS — R42 Dizziness and giddiness: Secondary | ICD-10-CM | POA: Diagnosis not present

## 2019-01-06 DIAGNOSIS — I69398 Other sequelae of cerebral infarction: Secondary | ICD-10-CM | POA: Diagnosis not present

## 2019-01-06 DIAGNOSIS — R42 Dizziness and giddiness: Secondary | ICD-10-CM | POA: Diagnosis not present

## 2019-01-06 DIAGNOSIS — I110 Hypertensive heart disease with heart failure: Secondary | ICD-10-CM | POA: Diagnosis not present

## 2019-01-06 DIAGNOSIS — U071 COVID-19: Secondary | ICD-10-CM | POA: Diagnosis not present

## 2019-01-06 DIAGNOSIS — R531 Weakness: Secondary | ICD-10-CM | POA: Diagnosis not present

## 2019-01-06 DIAGNOSIS — I4891 Unspecified atrial fibrillation: Secondary | ICD-10-CM | POA: Diagnosis not present

## 2019-01-07 DIAGNOSIS — I4891 Unspecified atrial fibrillation: Secondary | ICD-10-CM | POA: Diagnosis not present

## 2019-01-07 DIAGNOSIS — I110 Hypertensive heart disease with heart failure: Secondary | ICD-10-CM | POA: Diagnosis not present

## 2019-01-07 DIAGNOSIS — R42 Dizziness and giddiness: Secondary | ICD-10-CM | POA: Diagnosis not present

## 2019-01-07 DIAGNOSIS — R531 Weakness: Secondary | ICD-10-CM | POA: Diagnosis not present

## 2019-01-07 DIAGNOSIS — U071 COVID-19: Secondary | ICD-10-CM | POA: Diagnosis not present

## 2019-01-07 DIAGNOSIS — I69398 Other sequelae of cerebral infarction: Secondary | ICD-10-CM | POA: Diagnosis not present

## 2019-01-08 DIAGNOSIS — I110 Hypertensive heart disease with heart failure: Secondary | ICD-10-CM | POA: Diagnosis not present

## 2019-01-08 DIAGNOSIS — R531 Weakness: Secondary | ICD-10-CM | POA: Diagnosis not present

## 2019-01-08 DIAGNOSIS — R42 Dizziness and giddiness: Secondary | ICD-10-CM | POA: Diagnosis not present

## 2019-01-08 DIAGNOSIS — I4891 Unspecified atrial fibrillation: Secondary | ICD-10-CM | POA: Diagnosis not present

## 2019-01-08 DIAGNOSIS — U071 COVID-19: Secondary | ICD-10-CM | POA: Diagnosis not present

## 2019-01-08 DIAGNOSIS — I69398 Other sequelae of cerebral infarction: Secondary | ICD-10-CM | POA: Diagnosis not present

## 2019-01-09 ENCOUNTER — Telehealth: Payer: Self-pay | Admitting: Cardiology

## 2019-01-09 NOTE — Telephone Encounter (Signed)
Patient's daughter is calling in regards to 3lb weight gain overnight. Please call with instructions. / tg

## 2019-01-09 NOTE — Telephone Encounter (Signed)
Returned pt call. No answer, no voicemail available.  

## 2019-01-13 ENCOUNTER — Telehealth: Payer: Self-pay | Admitting: Pharmacist

## 2019-01-13 DIAGNOSIS — R42 Dizziness and giddiness: Secondary | ICD-10-CM | POA: Diagnosis not present

## 2019-01-13 DIAGNOSIS — I69398 Other sequelae of cerebral infarction: Secondary | ICD-10-CM | POA: Diagnosis not present

## 2019-01-13 DIAGNOSIS — R531 Weakness: Secondary | ICD-10-CM | POA: Diagnosis not present

## 2019-01-13 DIAGNOSIS — U071 COVID-19: Secondary | ICD-10-CM | POA: Diagnosis not present

## 2019-01-13 DIAGNOSIS — I4891 Unspecified atrial fibrillation: Secondary | ICD-10-CM | POA: Diagnosis not present

## 2019-01-13 DIAGNOSIS — I110 Hypertensive heart disease with heart failure: Secondary | ICD-10-CM | POA: Diagnosis not present

## 2019-01-13 NOTE — Telephone Encounter (Signed)
Called pt to discuss changing from warfarin to [place] due to better efficacy and safety data, as well as less frequent monitoring, especially given COVID-19 pandemic.   Left message for pt to discuss, copay would be ~$42/month.

## 2019-01-14 ENCOUNTER — Ambulatory Visit (INDEPENDENT_AMBULATORY_CARE_PROVIDER_SITE_OTHER): Payer: Medicare Other | Admitting: *Deleted

## 2019-01-14 ENCOUNTER — Encounter: Payer: Self-pay | Admitting: Family

## 2019-01-14 ENCOUNTER — Other Ambulatory Visit: Payer: Self-pay

## 2019-01-14 ENCOUNTER — Ambulatory Visit (INDEPENDENT_AMBULATORY_CARE_PROVIDER_SITE_OTHER): Payer: Medicare Other | Admitting: Family

## 2019-01-14 ENCOUNTER — Ambulatory Visit: Payer: Self-pay

## 2019-01-14 VITALS — BP 100/72 | HR 66 | Temp 96.9°F | Ht 62.0 in | Wt 169.6 lb

## 2019-01-14 DIAGNOSIS — Z0000 Encounter for general adult medical examination without abnormal findings: Secondary | ICD-10-CM | POA: Diagnosis not present

## 2019-01-14 DIAGNOSIS — I4891 Unspecified atrial fibrillation: Secondary | ICD-10-CM

## 2019-01-14 DIAGNOSIS — Z5181 Encounter for therapeutic drug level monitoring: Secondary | ICD-10-CM | POA: Diagnosis not present

## 2019-01-14 DIAGNOSIS — Z1159 Encounter for screening for other viral diseases: Secondary | ICD-10-CM

## 2019-01-14 DIAGNOSIS — E1140 Type 2 diabetes mellitus with diabetic neuropathy, unspecified: Secondary | ICD-10-CM

## 2019-01-14 DIAGNOSIS — Z8673 Personal history of transient ischemic attack (TIA), and cerebral infarction without residual deficits: Secondary | ICD-10-CM | POA: Diagnosis not present

## 2019-01-14 LAB — POCT INR: INR: 3.5 — AB (ref 2.0–3.0)

## 2019-01-14 MED ORDER — METFORMIN HCL 1000 MG PO TABS: 1000.0000 mg | ORAL_TABLET | Freq: Two times a day (BID) | ORAL | 1 refills | Status: DC

## 2019-01-14 NOTE — Telephone Encounter (Signed)
Returned call to pt. No answer. Unable to leave msg.

## 2019-01-14 NOTE — Progress Notes (Signed)
Subjective:   LUWANDA PRIDE is a 78 y.o. female who presents for Medicare Annual (Subsequent) preventive examination.  Review of Systems:  Cardiac Risk Factors include: advanced age (>55men, >23 women);diabetes mellitus;family history of premature cardiovascular disease;smoking/ tobacco exposure;obesity (BMI >30kg/m2);dyslipidemia;hypertension     Objective:     Vitals: BP 100/72   Pulse 66   Temp (!) 96.9 F (36.1 C) (Temporal)   Ht 5\' 2"  (1.575 m)   Wt 169 lb 9.6 oz (76.9 kg)   SpO2 97%   BMI 31.02 kg/m   Body mass index is 31.02 kg/m.  Advanced Directives 12/24/2018 12/23/2018 12/11/2018 08/13/2018 01/09/2018 09/19/2017 09/05/2017  Does Patient Have a Medical Advance Directive? Yes Yes Yes Yes Yes Yes Yes  Type of Industrial/product designer of [employer] Power of [place];Living will [place];Living will [place];Living will [place];Living will [place];Living will  Does patient want to make changes to medical advance directive? No - Patient declined - No - Patient declined No - Patient declined No - Patient declined - No - Patient declined  Copy of [place] in Chart? Yes - validated most recent copy scanned in chart (See row information) Yes - validated most recent copy scanned in chart (See row information) Yes - validated most recent copy scanned in chart (See row information) Yes - validated most recent copy scanned in chart (See row information) Yes Yes Yes  Would patient like information on creating a medical advance directive? - - - - - - No - Patient declined    Tobacco Social History   Tobacco Use  Smoking Status Former Smoker  . Packs/day: 1.00  . Years: 20.00  . Pack years: 20.00  . Types: Cigarettes  . Quit date: 09/12/1966  . Years since quitting: 52.3  Smokeless Tobacco Never Used     Counseling given: Not Answered    Clinical Intake:  Pre-visit preparation completed: No  Pain : No/denies pain     BMI - recorded: 31.02 Nutritional Status: BMI > 30  Obese Nutritional Risks: None Diabetes: Yes CBG done?: No Did pt. bring in CBG monitor from home?: Yes(80's-200's) Glucose Meter Downloaded?: No  How often do you need to have someone help you when you read instructions, pamphlets, or other written materials from your doctor or pharmacy?: 5 - Always What is the last grade level you completed in school?: masters degree x 2  Interpreter Needed?: No  Information entered by :: Dinah Ngetich FNP-C  Past Medical History:  Diagnosis Date  . Adjustment disorder 10/17/2005  . Atrial fibrillation ([place])   . Cataract    bilateral  . CHF (congestive heart failure) ([place])   . Contracture of knee joint 07/26/2009  . Coronary artery disease   . Degeneration of lumbar or lumbosacral intervertebral disc 07/26/2009  . Diabetes mellitus without complication ([place])   . Esophageal spasm   . Fibromyalgia   . Hearing loss 12/18/2010  . Heart murmur   . Hypercholesteremia   . Hypertension   . 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 Leaky heart valve   . Lyme disease   . Macular degeneration of both eyes   . Migraine 10/17/2005  . Mixed incontinence 10/17/2005  . Osteoarthritis    multiple joints   . Overweight 09/19/2010  . Pneumonia   . PONV (postoperative nausea and vomiting)   . Postartificial menopausal syndrome 10/10/2009  .  Prinzmetal angina ([place]) 10/17/2005  . Psoriasis 06/12/2006  . Sciatica   . TIA (transient ischemic attack)   . Type II diabetes mellitus ([place])   . [name] 10/17/2005   Past Surgical History:  Procedure Laterality Date  . ABDOMINAL HYSTERECTOMY    . AORTIC VALVE REPLACEMENT  12/10/2013   porcine  . CATARACT EXTRACTION, BILATERAL    . CHOLECYSTECTOMY  1981  . COLONOSCOPY  04/18/2006   Dr. Nelva Nay: internal hemorrhoids  . COLONOSCOPY   10/2003   Dr. Alphonsa Gin: hemorrhoids  . CORONARY ARTERY BYPASS GRAFT  2015  . ESOPHAGOGASTRODUODENOSCOPY  10/16/2013   Dr. Marla Roe: prior Nissen fundoplication intact, hypertonic LES, dilated up to 57 [place] with moderate resistance, gastritis but no H. pylori., Reactive gastritis, no celiac disease.  . ESOPHAGOGASTRODUODENOSCOPY  05/29/2012   Dr. Doy Mince: Moderately severe esophagitis, acute gastritis reactive, no H pylori, no celiac. Esophageal biopsies consistent with GERD, no Barrett  . ESOPHAGOGASTRODUODENOSCOPY  03/21/2009   Dr. Doy Mince: Reflux esophagitis, gastritis without H. pylori, esophagus stretched 57 savory  . ESOPHAGOGASTRODUODENOSCOPY  02/20/2008   Dr. Doy Mince: Esophagus dilated to 29 French, reactive gastropathy with no H pylori. No Barrett's on esophageal biopsy  . ESOPHAGOGASTRODUODENOSCOPY  09/24/2006   Dr. Doy Mince: Tight wrap noted, reactive gastropathy, no Barrett's  . ESOPHAGOGASTRODUODENOSCOPY (EGD) WITH PROPOFOL N/A 01/09/2017   Procedure: ESOPHAGOGASTRODUODENOSCOPY (EGD) WITH PROPOFOL;  Surgeon: Daneil Dolin, MD;  [facility];  Service: Endoscopy;  Laterality: N/A;  2:15PM  . FRACTURE SURGERY Left    wrist  . JOINT REPLACEMENT    . MALONEY DILATION N/A 01/09/2017   Procedure: Venia Minks DILATION;  Surgeon: Daneil Dolin, MD;  Location: [facility];  Service: Endoscopy;  Laterality: N/A;  . MITRAL VALVE REPAIR  11/2013  . NISSEN FUNDOPLICATION  [id number]  . REPLACEMENT TOTAL KNEE BILATERAL  2010/2013   Family History  Problem Relation Age of Onset  . Stroke Mother   . Heart failure Mother   . Heart disease Mother   . Mitral valve prolapse Mother   . Diabetes Father   . Heart attack Father   . Stroke Father   . Heart disease Father   . Hypertension Father   . Alzheimer's disease Father   . Stroke Maternal Grandmother   . Arthritis Maternal Grandfather        hands  . Breast cancer Paternal Grandmother   . Osteoporosis Paternal  Grandmother   . Breast cancer Cousin   . Colon cancer Neg Hx    Social History   Socioeconomic History  . Marital status: Widowed    Spouse name: Not on file  . Number of children: 3  . Years of education: Not on file  . Highest education level: Not on file  Occupational History  . Not on file  Social Needs  . Financial resource strain: Not hard at all  . Food insecurity    Worry: Never true    Inability: Never true  . Transportation needs    Medical: No    Non-medical: No  Tobacco Use  . Smoking status: Former Smoker    Packs/day: 1.00    Years: 20.00    Pack years: 20.00    Types: Cigarettes    Quit date: 09/12/1966    Years since quitting: 52.3  . Smokeless tobacco: Never Used  Substance and Sexual Activity  . Alcohol use: No  . Drug use: No  . Sexual activity: Never  Lifestyle  . Physical activity  Days per week: 6 days    Minutes per session: 30 min  . Stress: Only a little  Relationships  . Social connections    Talks on phone: More than three times a week    Gets together: More than three times a week    Attends religious service: More than 4 times per year    Active member of club or organization: No    Attends meetings of clubs or organizations: Never    Relationship status: Widowed  Other Topics Concern  . Not on file  Social History Narrative  . Not on file    Outpatient Encounter Medications as of 01/14/2019  Medication Sig  . acetaminophen (TYLENOL) 650 MG CR tablet Take 650-1,300 mg by mouth every 8 (eight) hours as needed for pain.   Marland Kitchen amLODipine (NORVASC) 5 MG tablet Take 1 tablet (5 mg total) by mouth daily.  Marland Kitchen aspirin EC 81 MG tablet Take 81 mg by mouth every morning.   Marland Kitchen atorvastatin (LIPITOR) 80 MG tablet TAKE 1 TABLET (80 MG TOTAL) BY MOUTH DAILY.  . calcium citrate-vitamin D (CITRACAL+D) 315-200 MG-UNIT tablet Take 1 tablet by mouth 2 (two) times daily.  . cetirizine (ZYRTEC) 10 MG chewable tablet Chew 10 mg by mouth daily.  .  Continuous Blood Gluc Receiver (FREESTYLE LIBRE 14 DAY READER) DEVI 1 application by Does not apply route 3 (three) times daily.  . Continuous Blood Gluc Sensor (FREESTYLE LIBRE 14 DAY SENSOR) MISC 1 application by Does not apply route 3 (three) times daily.  . empagliflozin (JARDIANCE) 10 MG TABS tablet Take 10 mg by mouth daily before supper.  . fluticasone (FLONASE) 50 MCG/ACT nasal spray USE 2 SPRAYS IN EACH NOSTRIL EVERY DAY  . furosemide (LASIX) 20 MG tablet TAKE 1 TABLET (20 MG TOTAL) BY MOUTH DAILY.  Marland Kitchen gabapentin (NEURONTIN) 100 MG capsule Take 100 mg by mouth See admin instructions. Taking 2 capsules in morning and 3 capsules at night  . lisinopril (ZESTRIL) 20 MG tablet Take 1 tablet (20 mg total) by mouth daily.  Marland Kitchen loperamide (IMODIUM A-D) 2 MG tablet Take 2 mg by mouth as needed for diarrhea or loose stools.  . metFORMIN (GLUCOPHAGE) 1000 MG tablet Take 1 tablet (1,000 mg total) by mouth 2 (two) times daily with a meal.  . metoprolol tartrate (LOPRESSOR) 25 MG tablet TAKE 1/2 TABLET TWO TIMES DAILY.  Marland Kitchen potassium chloride (K-DUR) 10 MEQ tablet TAKE 1 TABLET EVERY DAY  . Probiotic Product (FORTIFY DAILY PROBIOTIC PO) Take 1 capsule by mouth daily.   Marland Kitchen tobramycin (TOBREX) 0.3 % ophthalmic solution Place 1 drop into both eyes See admin instructions. Place 1 drop in both eyes four times daily the day before, day of, and day after eye injections.  . Vitamin D, Ergocalciferol, (DRISDOL) 50000 units CAPS capsule Take 1 capsule (50,000 Units total) by mouth every 7 (seven) days.  Marland Kitchen warfarin (COUMADIN) 6 MG tablet TAKE 1 TABLET EVERY DAY  . [DISCONTINUED] metFORMIN (GLUCOPHAGE) 1000 MG tablet Take 1 tablet (1,000 mg total) by mouth 2 (two) times daily with a meal.   No facility-administered encounter medications on file as of 01/14/2019.     Activities of Daily Living In your present state of health, do you have any difficulty performing the following activities: 01/14/2019 12/24/2018   Hearing? Y N  Comment wears hearing aids -  Vision? Y Y  Difficulty concentrating or making decisions? Y N  Comment memory issues -  Walking or climbing stairs? Darreld Mclean  N  Comment hx strokes -  Dressing or bathing? N N  Doing errands, shopping? Y N  Comment Needs assistance with trandportation -  Preparing Food and eating ? N -  Using the Toilet? N -  In the past six months, have you accidently leaked urine? Y -  Do you have problems with loss of bowel control? N -  Managing your Medications? Y -  Comment daughter assist -  Managing your Finances? Y -  Comment daughter manages finance -  Housekeeping or managing your Housekeeping? Y -  Comment has an Environmental consultant -  Some recent data might be hidden    Patient Care Team: Gayland Curry, DO as PCP - General (Geriatric Medicine) Harl Bowie Alphonse Guild, MD as PCP - Cardiology (Cardiology) Gala Romney Cristopher Estimable, MD as Consulting Physician (Gastroenterology) Pieter Partridge, DO as Consulting Physician (Neurology)    Assessment:   This is a routine wellness examination for Glendi.  Exercise Activities and Dietary recommendations Current Exercise Habits: Home exercise routine, Type of exercise: stretching;Other - see comments(marching exercises), Time (Minutes): 30, Frequency (Times/Week): 6, Weekly Exercise (Minutes/Week): 180, Intensity: Mild, Exercise limited by: None identified  Goals    . Plan meals     Pt will see a dietician to put together meals.       Fall Risk Fall Risk  01/14/2019 11/11/2018 09/04/2018 08/13/2018 07/28/2018  Falls in the past year? 0 1 0 0 0  Number falls in past yr: 0 0 0 - 0  Injury with Fall? - 0 0 - 0  Risk for fall due to : - - - - -  Follow up - - - Falls evaluation completed -   Is the patient's home free of loose throw rugs in walkways, pet beds, electrical cords, etc?   no      Grab bars in the bathroom? yes      Handrails on the stairs?   no      Adequate lighting?   yes  Depression Screen PHQ 2/9 Scores  01/14/2019 09/04/2018 01/09/2018 09/05/2017  PHQ - 2 Score 0 0 1 0     Cognitive Function MMSE - Mini Mental State Exam 01/14/2019 01/09/2018 10/04/2016 12/06/2015  Not completed: - (No Data) - -  Orientation to time 5 4 5 5   Orientation to Place 5 5 5 4   Registration 3 3 3 3   Attention/ Calculation 5 3 5 5   Recall 3 3 2 3   Language- name 2 objects 2 2 2 2   Language- repeat 1 1 1 1   Language- follow 3 step command 3 3 2 3   Language- read & follow direction 1 1 1 1   Write a sentence 1 1 1 1   Copy design 1 0 1 0  Copy design-comments - - - Patient has poor visual acuity.  Total score 30 26 28 28         Immunization History  Administered Date(s) Administered  . Fluad Quad(high Dose 65+) 12/11/2018  . Influenza Split 12/23/2003, 01/15/2005, 03/11/2007, 11/24/2013  . Influenza, High Dose Seasonal PF 12/26/2017  . Influenza,inj,Quad PF,6+ Mos 12/06/2015  . Influenza,inj,quad, With Preservative 12/06/2015  . Influenza-Unspecified 11/23/2014, 01/16/2017  . Pneumococcal Conjugate-13 11/01/2015  . Pneumococcal Polysaccharide-23 09/05/2017  . Tdap 08/15/2006, 10/12/2016    Qualifies for Shingles Vaccine? Decline due to cost   Screening Tests Health Maintenance  Topic Date Due  . FOOT EXAM  09/06/2018  . OPHTHALMOLOGY EXAM  05/14/2019  . HEMOGLOBIN A1C  06/25/2019  .  TETANUS/TDAP  10/13/2026  . INFLUENZA VACCINE  Completed  . DEXA SCAN  Completed  . PNA vac Low Risk Adult  Completed    Cancer Screenings: Lung: Low Dose CT Chest recommended if Age 46-80 years, 30 pack-year currently smoking OR have quit w/in 15years. Patient does not qualify. Breast:  Up to date on Mammogram? No   Up to date of Bone Density/Dexa? Yes Colorectal:declined   Additional Screenings: Hepatitis C Screening: Ordered this visit.      Plan:  - Declined colonoscopy,mammogram,and shingel vaccine. - Hepatitis C screening   I have personally reviewed and noted the following in the patient's chart:    . Medical and social history . Use of alcohol, tobacco or illicit drugs  . Current medications and supplements . Functional ability and status . Nutritional status . Physical activity . Advanced directives . List of other physicians . Hospitalizations, surgeries, and ER visits in previous 12 months . Vitals . Screenings to include cognitive, depression, and falls . Referrals and appointments  In addition, I have reviewed and discussed with patient certain preventive protocols, quality metrics, and best practice recommendations. A written personalized care plan for preventive services as well as general preventive health recommendations were provided to patient.     Sandrea Hughs, NP  01/14/2019

## 2019-01-14 NOTE — Patient Instructions (Signed)
Took coumadin this morning. Hold coumadin on Thursday then resume 1 tablet daily except 1/2 tablet on Sundays Recheck in 2 weeks

## 2019-01-14 NOTE — Addendum Note (Signed)
Addended by: Ruthell Rummage A on: 01/14/2019 11:58 AM   Modules accepted: Orders

## 2019-01-16 ENCOUNTER — Other Ambulatory Visit: Payer: Self-pay | Admitting: Internal Medicine

## 2019-01-16 ENCOUNTER — Other Ambulatory Visit: Payer: Self-pay | Admitting: *Deleted

## 2019-01-16 DIAGNOSIS — M8580 Other specified disorders of bone density and structure, unspecified site: Secondary | ICD-10-CM

## 2019-01-16 DIAGNOSIS — E1140 Type 2 diabetes mellitus with diabetic neuropathy, unspecified: Secondary | ICD-10-CM

## 2019-01-16 MED ORDER — EMPAGLIFLOZIN 10 MG PO TABS: 10.0000 mg | ORAL_TABLET | Freq: Every day | ORAL | 1 refills | Status: DC

## 2019-01-16 MED ORDER — AMLODIPINE BESYLATE 5 MG PO TABS: 5.0000 mg | ORAL_TABLET | Freq: Every day | ORAL | 1 refills | Status: DC

## 2019-01-16 MED ORDER — VITAMIN D (ERGOCALCIFEROL) 1.25 MG (50000 UNIT) PO CAPS: 50000.0000 [IU] | ORAL_CAPSULE | ORAL | 1 refills | Status: DC

## 2019-01-16 NOTE — Telephone Encounter (Signed)
[facility] 

## 2019-01-16 NOTE — Telephone Encounter (Signed)
RX request sent to Dr.Reed to review and approve if necessary  1.) Benzonatate not on current medication list. I called patients daughter [name], no answer, so I left message requesting call to clarify request   2.) Gabapentin is on medication list, last filled by you in January 2020

## 2019-01-19 ENCOUNTER — Telehealth: Payer: Self-pay

## 2019-01-19 NOTE — Telephone Encounter (Signed)
LVM for patient to return call to pharmacy line. Want to discuss changing from warfarin to DOAC due to better efficacy and safety data, as well as less frequent monitoring, especially given COVID-19 pandemic. 

## 2019-01-19 NOTE — Telephone Encounter (Signed)
Pt's daughter returned call to clinic, states copay of $42/month would be cost prohibitive for pt. Will continue warfarin.

## 2019-01-20 DIAGNOSIS — I4891 Unspecified atrial fibrillation: Secondary | ICD-10-CM | POA: Diagnosis not present

## 2019-01-20 DIAGNOSIS — R42 Dizziness and giddiness: Secondary | ICD-10-CM | POA: Diagnosis not present

## 2019-01-20 DIAGNOSIS — I69398 Other sequelae of cerebral infarction: Secondary | ICD-10-CM | POA: Diagnosis not present

## 2019-01-20 DIAGNOSIS — I110 Hypertensive heart disease with heart failure: Secondary | ICD-10-CM | POA: Diagnosis not present

## 2019-01-20 DIAGNOSIS — R531 Weakness: Secondary | ICD-10-CM | POA: Diagnosis not present

## 2019-01-20 DIAGNOSIS — U071 COVID-19: Secondary | ICD-10-CM | POA: Diagnosis not present

## 2019-01-21 DIAGNOSIS — I110 Hypertensive heart disease with heart failure: Secondary | ICD-10-CM | POA: Diagnosis not present

## 2019-01-21 DIAGNOSIS — I69398 Other sequelae of cerebral infarction: Secondary | ICD-10-CM | POA: Diagnosis not present

## 2019-01-21 DIAGNOSIS — I4891 Unspecified atrial fibrillation: Secondary | ICD-10-CM | POA: Diagnosis not present

## 2019-01-21 DIAGNOSIS — R42 Dizziness and giddiness: Secondary | ICD-10-CM | POA: Diagnosis not present

## 2019-01-21 DIAGNOSIS — U071 COVID-19: Secondary | ICD-10-CM | POA: Diagnosis not present

## 2019-01-21 DIAGNOSIS — R531 Weakness: Secondary | ICD-10-CM | POA: Diagnosis not present

## 2019-01-27 ENCOUNTER — Ambulatory Visit: Payer: Medicare Other | Admitting: Orthotics

## 2019-01-27 ENCOUNTER — Ambulatory Visit (INDEPENDENT_AMBULATORY_CARE_PROVIDER_SITE_OTHER): Payer: Medicare Other | Admitting: *Deleted

## 2019-01-27 ENCOUNTER — Encounter: Payer: Self-pay | Admitting: Sports Medicine

## 2019-01-27 ENCOUNTER — Other Ambulatory Visit: Payer: Self-pay

## 2019-01-27 ENCOUNTER — Ambulatory Visit (INDEPENDENT_AMBULATORY_CARE_PROVIDER_SITE_OTHER): Payer: Medicare Other | Admitting: Sports Medicine

## 2019-01-27 DIAGNOSIS — M722 Plantar fascial fibromatosis: Secondary | ICD-10-CM

## 2019-01-27 DIAGNOSIS — M2040 Other hammer toe(s) (acquired), unspecified foot: Secondary | ICD-10-CM

## 2019-01-27 DIAGNOSIS — M79674 Pain in right toe(s): Secondary | ICD-10-CM

## 2019-01-27 DIAGNOSIS — Q6650 Congenital pes planus, unspecified foot: Secondary | ICD-10-CM

## 2019-01-27 DIAGNOSIS — M79675 Pain in left toe(s): Secondary | ICD-10-CM | POA: Diagnosis not present

## 2019-01-27 DIAGNOSIS — B351 Tinea unguium: Secondary | ICD-10-CM

## 2019-01-27 DIAGNOSIS — I4891 Unspecified atrial fibrillation: Secondary | ICD-10-CM

## 2019-01-27 DIAGNOSIS — Z8673 Personal history of transient ischemic attack (TIA), and cerebral infarction without residual deficits: Secondary | ICD-10-CM | POA: Diagnosis not present

## 2019-01-27 DIAGNOSIS — Z5181 Encounter for therapeutic drug level monitoring: Secondary | ICD-10-CM

## 2019-01-27 DIAGNOSIS — M2041 Other hammer toe(s) (acquired), right foot: Secondary | ICD-10-CM

## 2019-01-27 DIAGNOSIS — E1142 Type 2 diabetes mellitus with diabetic polyneuropathy: Secondary | ICD-10-CM | POA: Diagnosis not present

## 2019-01-27 DIAGNOSIS — M2141 Flat foot [pes planus] (acquired), right foot: Secondary | ICD-10-CM

## 2019-01-27 DIAGNOSIS — M2142 Flat foot [pes planus] (acquired), left foot: Secondary | ICD-10-CM

## 2019-01-27 LAB — POCT INR: INR: 2.8 (ref 2.0–3.0)

## 2019-01-27 NOTE — Progress Notes (Signed)
Patient presents today for diabetic shoe measurement and foam casting.  Goals of diabetic shoes/inserts to offer protection from conditions secondary to DM2, offer relief from sheer forces that could lead to ulcerations, protect the foot, and offer greater stability. Patient is under supervision of DPM Physician managing patients DM2: Patient has following documented conditions to qualify for diabetic shoes/inserts: Patient measured with brannock device: 

## 2019-01-27 NOTE — Patient Instructions (Signed)
Took coumadin this morning. Continue warfarin 1 tablet daily except 1/2 tablet on Sundays Recheck in 4 weeks

## 2019-01-27 NOTE — Progress Notes (Signed)
Subjective: Jeanette Yates is a 78 y.o. female patient with history of diabetes who returns to office today complaining of long,mildly painful nails while ambulating in shoes; unable to trim. Patient states that the glucose reading this morning was 90 mg/dl. Patient admits has been started on a new diabetic medication that helps. Reports that she is also here today for measurements for diabetic shoes.  Admits to decreased pain along the plantar surfaces of both feet.  No other issues noted.  Patient Active Problem List   Diagnosis Date Noted  . COVID-19 virus infection 12/24/2018  . Acute ischemic stroke ([place]) 12/24/2018  . Stroke ([place]) 12/24/2018  . Grief reaction with prolonged bereavement 04/24/2018  . Senile osteopenia 04/24/2018  . Injury of right wrist 04/24/2018  . Plantar fasciitis, bilateral 04/24/2018  . Irritable bowel syndrome with both constipation and diarrhea 04/24/2018  . Lactose intolerance 04/24/2018  . Influenza 04/24/2018  . Body mass index (BMI) of 30.0-30.9 in adult 09/05/2017  . Class 1 obesity due to excess calories with serious comorbidity and body mass index (BMI) of 30.0 to 30.9 in adult 09/05/2017  . Seasonal allergies 09/05/2017  . Hoarseness 09/05/2017  . Hyperlipidemia associated with type 2 diabetes mellitus ([place]) 09/05/2017  . Balance problem 02/11/2017  . Dysphagia 11/15/2016  . S/P AVR (aortic valve replacement) 04/13/2016  . Encounter for therapeutic drug monitoring 01/02/2016  . Hx of TIA (transient ischemic attack) and stroke 12/06/2015  . Type 2 diabetes mellitus with complication, without long-term current use of insulin ([place])   . Osteoarthritis   . Fibromyalgia   . Atrial fibrillation ([place])   . CHF (congestive heart failure) ([place])   . Esophageal spasm   . Numbness on right side 05/03/2015  . TIA (transient ischemic attack) 05/02/2015  . Essential hypertension 05/02/2015   Current Outpatient Medications on File Prior to Visit  Medication Sig  Dispense Refill  . acetaminophen (TYLENOL) 650 MG CR tablet Take 650-1,300 mg by mouth every 8 (eight) hours as needed for pain.     Marland Kitchen amLODipine (NORVASC) 5 MG tablet Take 1 tablet (5 mg total) by mouth at bedtime. 90 tablet 1  . aspirin EC 81 MG tablet Take 81 mg by mouth every morning.     Marland Kitchen atorvastatin (LIPITOR) 80 MG tablet TAKE 1 TABLET (80 MG TOTAL) BY MOUTH DAILY. 90 tablet 1  . benzonatate (TESSALON) 100 MG capsule TAKE 1 CAPSULE (100 MG TOTAL) BY MOUTH 2 (TWO) TIMES DAILY AS NEEDED FOR COUGH. 20 capsule 0  . calcium citrate-vitamin D (CITRACAL+D) 315-200 MG-UNIT tablet Take 1 tablet by mouth 2 (two) times daily.    . cetirizine (ZYRTEC) 10 MG chewable tablet Chew 10 mg by mouth daily.    . Continuous Blood Gluc Receiver (FREESTYLE LIBRE 14 DAY READER) DEVI 1 application by Does not apply route 3 (three) times daily. 1 Device 5  . Continuous Blood Gluc Sensor (FREESTYLE LIBRE 14 DAY SENSOR) MISC 1 application by Does not apply route 3 (three) times daily. 1 each 5  . empagliflozin (JARDIANCE) 10 MG TABS tablet Take 10 mg by mouth daily before supper. 90 tablet 1  . fluticasone (FLONASE) 50 MCG/ACT nasal spray USE 2 SPRAYS IN EACH NOSTRIL EVERY DAY 48 g 0  . furosemide (LASIX) 20 MG tablet TAKE 1 TABLET EVERY MORNING AT 8AM 90 tablet 1  . gabapentin (NEURONTIN) 100 MG capsule TAKE 4 CAPSULES EVERY DAY 360 capsule 1  . lisinopril (ZESTRIL) 20 MG tablet Take 1 tablet (  20 mg total) by mouth daily. 90 tablet 1  . loperamide (IMODIUM A-D) 2 MG tablet Take 2 mg by mouth as needed for diarrhea or loose stools.    . metFORMIN (GLUCOPHAGE) 1000 MG tablet Take 1 tablet (1,000 mg total) by mouth 2 (two) times daily with a meal. 180 tablet 1  . metoprolol tartrate (LOPRESSOR) 25 MG tablet TAKE 1/2 TABLET TWO TIMES DAILY. 90 tablet 0  . potassium chloride (K-DUR) 10 MEQ tablet TAKE 1 TABLET EVERY DAY 90 tablet 1  . Probiotic Product (FORTIFY DAILY PROBIOTIC PO) Take 1 capsule by mouth daily.     Marland Kitchen  tobramycin (TOBREX) 0.3 % ophthalmic solution Place 1 drop into both eyes See admin instructions. Place 1 drop in both eyes four times daily the day before, day of, and day after eye injections.    . Vitamin D, Ergocalciferol, (DRISDOL) 1.25 MG (50000 UT) CAPS capsule Take 1 capsule (50,000 Units total) by mouth every 7 (seven) days. 12 capsule 1  . warfarin (COUMADIN) 6 MG tablet TAKE 1 TABLET EVERY DAY 90 tablet 3  . warfarin (COUMADIN) 6 MG tablet Take 6 mg by mouth See admin instructions. Take 6 mg every day except Sunday take 3 mg tablet     No current facility-administered medications on file prior to visit.    Allergies  Allergen Reactions  . Penicillins Anaphylaxis and Other (See Comments)    Has patient had a PCN reaction causing immediate rash, facial/tongue/throat swelling, SOB or lightheadedness with hypotension: Yes Has patient had a PCN reaction causing severe rash involving mucus membranes or skin necrosis: Yes Has patient had a PCN reaction that required hospitalization Yes Has patient had a PCN reaction occurring within the last 10 years: No If all of the above answers are "NO", then may proceed with Cephalosporin use.   . Latex Other (See Comments)    Redness and rash  . Morphine And Related Nausea Only and Other (See Comments)    Dizziness  . Tape Other (See Comments)    Redness and rash  . Iodine Other (See Comments)    Redness and rash    Recent Results (from the past 2160 hour(s))  Hemoglobin A1c     Status: Abnormal   Collection Time: 12/11/18  3:02 PM  Result Value Ref Range   Hgb A1c MFr Bld 7.7 (H) <5.7 % of total Hgb    Comment: For someone without known diabetes, a hemoglobin A1c value of 6.5% or greater indicates that they may have  diabetes and this should be confirmed with a follow-up  test. . For someone with known diabetes, a value <7% indicates  that their diabetes is well controlled and a value  greater than or equal to 7% indicates suboptimal   control. A1c targets should be individualized based on  duration of diabetes, age, comorbid conditions, and  other considerations. . Currently, no consensus exists regarding use of hemoglobin A1c for diagnosis of diabetes for children. .    Mean Plasma Glucose 174 (calc)   eAG (mmol/L) 9.7 (calc)  Basic metabolic panel     Status: None   Collection Time: 12/11/18  3:02 PM  Result Value Ref Range   Glucose, Bld 126 65 - 139 mg/dL    Comment: .        Non-fasting reference interval .    BUN 24 7 - 25 mg/dL   Creat 0.86 0.60 - 0.93 mg/dL    Comment: For patients >49 years of  age, the reference limit for Creatinine is approximately 13% higher for people identified as African-American. .    BUN/Creatinine Ratio NOT APPLICABLE 6 - 22 (calc)   Sodium 142 135 - 146 mmol/L   Potassium 4.3 3.5 - 5.3 mmol/L   Chloride 103 98 - 110 mmol/L   CO2 29 20 - 32 mmol/L   Calcium 9.8 8.6 - 10.4 mg/dL  POCT INR     Status: Normal   Collection Time: 12/16/18  8:43 AM  Result Value Ref Range   INR 2.7 2.0 - 3.0  CBC     Status: Abnormal   Collection Time: 12/23/18  3:58 PM  Result Value Ref Range   WBC 10.3 4.0 - 10.5 K/uL   RBC 4.61 3.87 - 5.11 MIL/uL   Hemoglobin 12.8 12.0 - 15.0 g/dL   HCT 41.2 36.0 - 46.0 %   MCV 89.4 80.0 - 100.0 fL   MCH 27.8 26.0 - 34.0 pg   MCHC 31.1 30.0 - 36.0 g/dL   RDW 13.9 11.5 - 15.5 %   Platelets 422 (H) 150 - 400 K/uL   nRBC 0.0 0.0 - 0.2 %    Comment: Performed at [facility], [address], [place], [place] [id number]  Basic metabolic panel     Status: Abnormal   Collection Time: 12/23/18  3:58 PM  Result Value Ref Range   Sodium 137 135 - 145 mmol/L   Potassium 4.1 3.5 - 5.1 mmol/L   Chloride 106 98 - 111 mmol/L   CO2 22 22 - 32 mmol/L   Glucose, Bld 147 (H) 70 - 99 mg/dL   BUN 25 (H) 8 - 23 mg/dL   Creatinine, Ser 0.92 0.44 - 1.00 mg/dL   Calcium 9.3 8.9 - 10.3 mg/dL   GFR calc non Af Amer >60 >60 mL/min   GFR calc Af Amer >60 >60 mL/min    Anion gap 9 5 - 15    Comment: Performed at [facility], [address] [place], [place] [address]  Troponin I (High Sensitivity)     Status: None   Collection Time: 12/23/18  3:58 PM  Result Value Ref Range   Troponin I (High Sensitivity) 10 <18 ng/L    Comment: (NOTE) Elevated high sensitivity troponin I (hsTnI) values and significant  changes across serial measurements may suggest ACS but many other  chronic and acute conditions are known to elevate hsTnI results.  Refer to the "Links" section for chest pain algorithms and additional  guidance. Performed at [facility], [address] [place], [place] [address]   Protime-INR     Status: Abnormal   Collection Time: 12/23/18  3:58 PM  Result Value Ref Range   Prothrombin Time 22.6 (H) 11.4 - 15.2 seconds   INR 2.0 (H) 0.8 - 1.2    Comment: (NOTE) INR goal varies based on device and disease states. Performed at [facility], [address] [place], [place] [address]   APTT     Status: Abnormal   Collection Time: 12/23/18  3:58 PM  Result Value Ref Range   aPTT 37 (H) 24 - 36 seconds    Comment:        IF BASELINE aPTT IS ELEVATED, SUGGEST PATIENT RISK ASSESSMENT BE USED TO DETERMINE APPROPRIATE ANTICOAGULANT THERAPY. Performed at [facility], [address], [place], [place] [address]   Urine rapid drug screen (hosp performed)     Status: None   Collection Time: 12/23/18  5:04 PM  Result Value  Ref Range   Opiates NONE DETECTED NONE DETECTED   Cocaine NONE DETECTED NONE DETECTED   Benzodiazepines NONE DETECTED NONE DETECTED   Amphetamines NONE DETECTED NONE DETECTED   Tetrahydrocannabinol NONE DETECTED NONE DETECTED   Barbiturates NONE DETECTED NONE DETECTED    Comment: (NOTE) DRUG SCREEN FOR MEDICAL PURPOSES ONLY.  IF CONFIRMATION IS NEEDED FOR ANY PURPOSE, NOTIFY LAB WITHIN 5 DAYS. LOWEST DETECTABLE LIMITS FOR URINE DRUG SCREEN Drug Class                     Cutoff (ng/mL) Amphetamine and metabolites     1000 Barbiturate and metabolites    200 Benzodiazepine                 [id number] Tricyclics and metabolites     300 Opiates and metabolites        300 Cocaine and metabolites        300 THC                            50 Performed at [facility] [place], [place] [address]   Urinalysis, Routine w reflex microscopic     Status: Abnormal   Collection Time: 12/23/18  5:04 PM  Result Value Ref Range   Color, Urine YELLOW YELLOW   APPearance HAZY (A) CLEAR   Specific Gravity, Urine 1.017 1.005 - 1.030   pH 5.0 5.0 - 8.0   Glucose, UA >=500 (A) NEGATIVE mg/dL   Hgb urine dipstick SMALL (A) NEGATIVE   Bilirubin Urine NEGATIVE NEGATIVE   Ketones, ur NEGATIVE NEGATIVE mg/dL   Protein, ur NEGATIVE NEGATIVE mg/dL   Nitrite POSITIVE (A) NEGATIVE   Leukocytes,Ua TRACE (A) NEGATIVE   RBC / HPF 0-5 0 - 5 RBC/hpf   WBC, UA 6-10 0 - 5 WBC/hpf   Bacteria, UA RARE (A) NONE SEEN   Squamous Epithelial / LPF 0-5 0 - 5   Mucus PRESENT     Comment: Performed at [facility], [address] [place], [place] [address]  Troponin I (High Sensitivity)     Status: None   Collection Time: 12/23/18  5:30 PM  Result Value Ref Range   Troponin I (High Sensitivity) 12 <18 ng/L    Comment: (NOTE) Elevated high sensitivity troponin I (hsTnI) values and significant  changes across serial measurements may suggest ACS but many other  chronic and acute conditions are known to elevate hsTnI results.  Refer to the "Links" section for chest pain algorithms and additional  guidance. Performed at [facility], [address] [place], [place] [address]   SARS Coronavirus 2 [facility] order, Performed in [facility] hospital lab) Nasopharyngeal Nasopharyngeal Swab     Status: Abnormal   Collection Time: 12/23/18  8:15 PM   Specimen: Nasopharyngeal Swab  Result Value Ref Range   SARS Coronavirus 2 POSITIVE (A) NEGATIVE    Comment: RESULT CALLED TO, READ BACK BY AND VERIFIED WITH: M DOSS,RN @2150  [id number]  MKELLY (NOTE) If result is NEGATIVE SARS-CoV-2 target nucleic acids are NOT DETECTED. The SARS-CoV-2 RNA is generally detectable in upper and lower  respiratory specimens during the acute phase of infection. The lowest  concentration of SARS-CoV-2 viral copies this assay can detect is 250  copies / mL. A negative result does not preclude SARS-CoV-2 infection  and should not be used as the sole basis for treatment or other  patient management decisions.  A negative  result may occur with  improper specimen collection / handling, submission of specimen other  than nasopharyngeal swab, presence of viral mutation(s) within the  areas targeted by this assay, and inadequate number of viral copies  (<250 copies / mL). A negative result must be combined with clinical  observations, patient history, and epidemiological information. If result is POSITIVE SARS-CoV-2 target nucleic acids are DETECTED. The S ARS-CoV-2 RNA is generally detectable in upper and lower  respiratory specimens during the acute phase of infection.  Positive  results are indicative of active infection with SARS-CoV-2.  Clinical  correlation with patient history and other diagnostic information is  necessary to determine patient infection status.  Positive results do  not rule out bacterial infection or co-infection with other viruses. If result is PRESUMPTIVE POSTIVE SARS-CoV-2 nucleic acids MAY BE PRESENT.   A presumptive positive result was obtained on the submitted specimen  and confirmed on repeat testing.  While 2019 novel coronavirus  (SARS-CoV-2) nucleic acids may be present in the submitted sample  additional confirmatory testing may be necessary for epidemiological  and / or clinical management purposes  to differentiate between  SARS-CoV-2 and other Sarbecovirus currently known to infect humans.  If clinically indicated additional testing with an alternate test  methodology [phone]) is adv ised. The SARS-CoV-2  RNA is generally  detectable in upper and lower respiratory specimens during the acute  phase of infection. The expected result is Negative. Fact Sheet for Patients:  StrictlyIdeas.no Fact Sheet for Healthcare Providers: BankingDealers.co.za This test is not yet approved or cleared by the [place] FDA and has been authorized for detection and/or diagnosis of SARS-CoV-2 by FDA under an Emergency Use Authorization (EUA).  This EUA will remain in effect (meaning this test can be used) for the duration of the COVID-19 declaration under Section 564(b)(1) of the Act, 21 U.S.C. section 360bbb-3(b)(1), unless the authorization is terminated or revoked sooner. Performed at [facility], [address], [place], [place] [address]   Urine culture     Status: Abnormal   Collection Time: 12/24/18  1:53 AM   Specimen: Urine, Random  Result Value Ref Range   Specimen Description URINE, RANDOM    Special Requests      NONE Performed at [facility] [address] [place], [place] [address]    Culture MULTIPLE SPECIES PRESENT, SUGGEST RECOLLECTION (A)    Report Status 12/25/2018 FINAL   C-reactive protein     Status: None   Collection Time: 12/24/18  6:44 AM  Result Value Ref Range   CRP 0.8 <1.0 mg/dL    Comment: Performed at [facility] [address], [place], [place] [address]  Procalcitonin - Baseline     Status: None   Collection Time: 12/24/18  6:44 AM  Result Value Ref Range   Procalcitonin <0.10 ng/mL    Comment:        Interpretation: PCT (Procalcitonin) <= 0.5 ng/mL: Systemic infection (sepsis) is not likely. Local bacterial infection is possible. (NOTE)       Sepsis PCT Algorithm           Lower Respiratory Tract                                      Infection PCT Algorithm    ----------------------------     ----------------------------         PCT < 0.25  ng/mL                PCT < 0.10 ng/mL         Strongly  encourage             Strongly discourage   discontinuation of antibiotics    initiation of antibiotics    ----------------------------     -----------------------------       PCT 0.25 - 0.50 ng/mL            PCT 0.10 - 0.25 ng/mL               OR       >80% decrease in PCT            Discourage initiation of                                            antibiotics      Encourage discontinuation           of antibiotics    ----------------------------     -----------------------------         PCT >= 0.50 ng/mL              PCT 0.26 - 0.50 ng/mL               AND        <80% decrease in PCT             Encourage initiation of                                             antibiotics       Encourage continuation           of antibiotics    ----------------------------     -----------------------------        PCT >= 0.50 ng/mL                  PCT > 0.50 ng/mL               AND         increase in PCT                  Strongly encourage                                      initiation of antibiotics    Strongly encourage escalation           of antibiotics                                     -----------------------------                                           PCT <= 0.25 ng/mL  OR                                        > 80% decrease in PCT                                     Discontinue / Do not initiate                                             antibiotics Performed at [facility], [place] [address] [place], [place] [address]   D-dimer, quantitative (not at [facility])     Status: None   Collection Time: 12/24/18  6:44 AM  Result Value Ref Range   D-Dimer, Quant <0.27 0.00 - 0.50 ug/mL-FEU    Comment: (NOTE) At the manufacturer cut-off of 0.50 ug/mL FEU, this assay has been documented to exclude PE with a sensitivity and negative predictive value of 97 to 99%.  At this time, this assay has not been approved by the FDA to exclude  DVT/VTE. Results should be correlated with clinical presentation. Performed at [facility], [place] [address] [place] [address], [place], [place] [address]   Protime-INR     Status: Abnormal   Collection Time: 12/24/18  6:44 AM  Result Value Ref Range   Prothrombin Time 18.9 (H) 11.4 - 15.2 seconds   INR 1.6 (H) 0.8 - 1.2    Comment: (NOTE) INR goal varies based on device and disease states. Performed at [facility], [place] [address] [place], [place] [address]   CBC with Differential/Platelet     Status: None   Collection Time: 12/24/18  6:44 AM  Result Value Ref Range   WBC 10.5 4.0 - 10.5 K/uL   RBC 4.23 3.87 - 5.11 MIL/uL   Hemoglobin 12.2 12.0 - 15.0 g/dL   HCT 37.1 36.0 - 46.0 %   MCV 87.7 80.0 - 100.0 fL   MCH 28.8 26.0 - 34.0 pg   MCHC 32.9 30.0 - 36.0 g/dL   RDW 14.1 11.5 - 15.5 %   Platelets 375 150 - 400 K/uL   nRBC 0.0 0.0 - 0.2 %   Neutrophils Relative % 68 %   Neutro Abs 7.2 1.7 - 7.7 K/uL   Lymphocytes Relative 19 %   Lymphs Abs 2.0 0.7 - 4.0 K/uL   Monocytes Relative 8 %   Monocytes Absolute 0.8 0.1 - 1.0 K/uL   Eosinophils Relative 4 %   Eosinophils Absolute 0.4 0.0 - 0.5 K/uL   Basophils Relative 1 %   Basophils Absolute 0.1 0.0 - 0.1 K/uL   Immature Granulocytes 0 %   Abs Immature Granulocytes 0.02 0.00 - 0.07 K/uL    Comment: Performed at [facility], [address] [place] [place],  [id number]  Basic metabolic panel     Status: Abnormal   Collection Time: 12/24/18  6:44 AM  Result Value Ref Range   Sodium 139 135 - 145 mmol/L   Potassium 3.6 3.5 - 5.1 mmol/L   Chloride 107 98 - 111 mmol/L   CO2 20 (L) 22 - 32 mmol/L   Glucose, Bld 220 (H) 70 - 99 mg/dL   BUN 18 8 -  23 mg/dL   Creatinine, Ser 0.97 0.44 - 1.00 mg/dL   Calcium 9.0 8.9 - 10.3 mg/dL   GFR calc non Af Amer 57 (L) >60 mL/min   GFR calc Af Amer >60 >60 mL/min   Anion gap 12 5 - 15    Comment: Performed at [facility] [address], [place], [place] [address]  MRSA PCR  Screening     Status: None   Collection Time: 12/24/18  6:54 AM   Specimen: Nasopharyngeal  Result Value Ref Range   MRSA by PCR NEGATIVE NEGATIVE    Comment:        The GeneXpert MRSA Assay (FDA approved for NASAL specimens only), is one component of a comprehensive MRSA colonization surveillance program. It is not intended to diagnose MRSA infection nor to guide or monitor treatment for MRSA infections. Performed at [facility], [place] [address] [place], [place] [address]   Glucose, capillary     Status: Abnormal   Collection Time: 12/24/18  7:27 AM  Result Value Ref Range   Glucose-Capillary 214 (H) 70 - 99 mg/dL  ECHOCARDIOGRAM COMPLETE     Status: None   Collection Time: 12/24/18 12:08 PM  Result Value Ref Range   Weight 2,649.05 oz   Height 62 in   BP 148/82 mmHg  Glucose, capillary     Status: Abnormal   Collection Time: 12/24/18 12:49 PM  Result Value Ref Range   Glucose-Capillary 205 (H) 70 - 99 mg/dL  Glucose, capillary     Status: Abnormal   Collection Time: 12/24/18  4:20 PM  Result Value Ref Range   Glucose-Capillary 269 (H) 70 - 99 mg/dL  Glucose, capillary     Status: None   Collection Time: 12/24/18  9:00 PM  Result Value Ref Range   Glucose-Capillary 92 70 - 99 mg/dL  Hemoglobin A1c     Status: Abnormal   Collection Time: 12/25/18  5:04 AM  Result Value Ref Range   Hgb A1c MFr Bld 7.5 (H) 4.8 - 5.6 %    Comment: (NOTE) Pre diabetes:          5.7%-6.4% Diabetes:              >6.4% Glycemic control for   <7.0% adults with diabetes    Mean Plasma Glucose 168.55 mg/dL    Comment: Performed at [facility], [place] [address] [place], [place] [address]  Lipid panel     Status: Abnormal   Collection Time: 12/25/18  5:04 AM  Result Value Ref Range   Cholesterol 120 0 - 200 mg/dL   Triglycerides 120 <150 mg/dL   HDL 39 (L) >40 mg/dL   Total CHOL/HDL Ratio 3.1 RATIO   VLDL 24 0 - 40 mg/dL   LDL Cholesterol 57 0 - 99 mg/dL    Comment:         Total Cholesterol/HDL:CHD Risk Coronary Heart Disease Risk Table                     Men   Women  1/2 Average Risk   3.4   3.3  Average Risk       5.0   4.4  2 X Average Risk   9.6   7.1  3 X Average Risk  23.4   11.0        Use the calculated Patient Ratio above and the CHD Risk Table to determine the patient's CHD Risk.  ATP III CLASSIFICATION (LDL):  <100     mg/dL   Optimal  100-129  mg/dL   Near or Above                    Optimal  130-159  mg/dL   Borderline  160-189  mg/dL   High  >190     mg/dL   Very High Performed at [facility] [address], [place], [place] [address]   Protime-INR     Status: Abnormal   Collection Time: 12/25/18  5:04 AM  Result Value Ref Range   Prothrombin Time 21.6 (H) 11.4 - 15.2 seconds   INR 1.9 (H) 0.8 - 1.2    Comment: (NOTE) INR goal varies based on device and disease states. Performed at [facility], [place] [address] [place], [place] [address]   Glucose, capillary     Status: Abnormal   Collection Time: 12/25/18  9:11 AM  Result Value Ref Range   Glucose-Capillary 108 (H) 70 - 99 mg/dL  Glucose, capillary     Status: Abnormal   Collection Time: 12/25/18 12:07 PM  Result Value Ref Range   Glucose-Capillary 182 (H) 70 - 99 mg/dL  Glucose, capillary     Status: Abnormal   Collection Time: 12/25/18  4:08 PM  Result Value Ref Range   Glucose-Capillary 149 (H) 70 - 99 mg/dL   Comment 1 Call MD NNP PA CNM   Glucose, capillary     Status: Abnormal   Collection Time: 12/25/18 10:58 PM  Result Value Ref Range   Glucose-Capillary 241 (H) 70 - 99 mg/dL  Protime-INR     Status: Abnormal   Collection Time: 12/26/18  5:49 AM  Result Value Ref Range   Prothrombin Time 23.3 (H) 11.4 - 15.2 seconds   INR 2.1 (H) 0.8 - 1.2    Comment: (NOTE) INR goal varies based on device and disease states. Performed at [facility], [place] [address] [place], [place] [address]   Glucose, capillary     Status: Abnormal    Collection Time: 12/26/18  7:40 AM  Result Value Ref Range   Glucose-Capillary 129 (H) 70 - 99 mg/dL  Glucose, capillary     Status: Abnormal   Collection Time: 12/26/18  9:32 AM  Result Value Ref Range   Glucose-Capillary 148 (H) 70 - 99 mg/dL  POCT INR     Status: Abnormal   Collection Time: 01/14/19  3:40 PM  Result Value Ref Range   INR 3.5 (A) 2.0 - 3.0  POCT INR     Status: Normal   Collection Time: 01/27/19  8:45 AM  Result Value Ref Range   INR 2.8 2.0 - 3.0    Objective: General: Patient is awake, alert, and oriented x 3 and in no acute distress.  Integument: Skin is warm, dry and supple bilateral. Nails are tender, long, thickened and  dystrophic with subungual debris, consistent with onychomycosis, 1-5 bilateral with peeling of right hallux nail. No signs of infection. No open lesions or preulcerative lesions present bilateral. Remaining integument unremarkable.  Vasculature:  Dorsalis Pedis pulse 1/4 bilateral. Posterior Tibial pulse  1/4 bilateral.  Capillary fill time <3 sec 1-5 bilateral. Positive hair growth to the level of the digits. Temperature gradient within normal limits. Mild varicosities present bilateral. Trace edema present bilateral.   Neurology: The patient has intact sensation measured with a 5.07/10g Semmes Weinstein Monofilament at all pedal sites bilateral. Vibratory sensation  diminished bilateral with tuning fork. No Babinski sign present bilateral.   Musculoskeletal:  Pes planus and minimal hammertoe pedal deformities noted bilateral. Reduced tenderness to plantar fascia insertions bilateral. Muscular strength 5/5 in all lower extremity muscular groups bilateral without pain on range of motion . No tenderness with calf compression bilateral.  Assessment and Plan: Problem List Items Addressed This Visit    None      -Examined patient. -Re-discussed and educated patient on diabetic foot care, especially with  regards to the vascular, neurological  and musculoskeletal systems.  -Stressed the importance of good glycemic control and the detriment of not  controlling glucose levels in relation to the foot. -Mechanically debrided all nails 1-5 bilateral using sterile nail nipper and filed with dremel without incident  -Patient to see Liliane Channel for diabetic shoes today -Continue with Gabapentin for neuropathy  -Continue with stretching for fasciitis  -Answered all patient questions -Patient to return  in 3 months for at risk foot care and when called for diabetic shoe pickup -Patient advised to call the office if any problems or questions arise in the meantime.  Landis Martins, DPM

## 2019-01-29 ENCOUNTER — Telehealth: Payer: Self-pay | Admitting: *Deleted

## 2019-01-29 NOTE — Telephone Encounter (Signed)
Received form from [facility] [employer] to be signed and faxed back to                      Fax: [phone]  Placed in Dr. Cyndi Lennert folder to review and sign.

## 2019-01-30 ENCOUNTER — Encounter: Payer: Self-pay | Admitting: Cardiology

## 2019-01-30 ENCOUNTER — Ambulatory Visit (INDEPENDENT_AMBULATORY_CARE_PROVIDER_SITE_OTHER): Payer: Medicare Other | Admitting: Cardiology

## 2019-01-30 ENCOUNTER — Other Ambulatory Visit: Payer: Self-pay | Admitting: Internal Medicine

## 2019-01-30 ENCOUNTER — Other Ambulatory Visit: Payer: Self-pay

## 2019-01-30 VITALS — BP 128/72 | HR 96 | Ht 62.0 in | Wt 168.0 lb

## 2019-01-30 DIAGNOSIS — E1140 Type 2 diabetes mellitus with diabetic neuropathy, unspecified: Secondary | ICD-10-CM

## 2019-01-30 DIAGNOSIS — I10 Essential (primary) hypertension: Secondary | ICD-10-CM

## 2019-01-30 DIAGNOSIS — I5022 Chronic systolic (congestive) heart failure: Secondary | ICD-10-CM | POA: Diagnosis not present

## 2019-01-30 DIAGNOSIS — I639 Cerebral infarction, unspecified: Secondary | ICD-10-CM

## 2019-01-30 DIAGNOSIS — I4891 Unspecified atrial fibrillation: Secondary | ICD-10-CM | POA: Diagnosis not present

## 2019-01-30 DIAGNOSIS — H3530 Unspecified macular degeneration: Secondary | ICD-10-CM

## 2019-01-30 DIAGNOSIS — H547 Unspecified visual loss: Secondary | ICD-10-CM

## 2019-01-30 MED ORDER — CARVEDILOL 6.25 MG PO TABS: 6.2500 mg | ORAL_TABLET | Freq: Two times a day (BID) | ORAL | 1 refills | Status: DC

## 2019-01-30 MED ORDER — FUROSEMIDE 40 MG PO TABS: 40.0000 mg | ORAL_TABLET | Freq: Every day | ORAL | 1 refills | Status: DC

## 2019-01-30 MED ORDER — CARVEDILOL 6.25 MG PO TABS: 6.2500 mg | ORAL_TABLET | Freq: Two times a day (BID) | ORAL | 0 refills | Status: DC

## 2019-01-30 NOTE — Progress Notes (Signed)
Clinical Summary Ms. Totman is a 78 y.o.female  seen today for follow up of the following medical problems.    1. Valvular heart disease - echo 10/2013 severe MR, moderate AI - TEE 11/2013 LVEF 25-30%, severe MR due to MV anular dilatation, moderate AI - 11/2013 s/p 23 mm Hancock II porcine valve AVR, MV repair with Medtronic 3D ring. Postop complicated by pleural effusion, afib - echo 04/2015 mild MR, normally functioning AVR. LVEF 50-55% - 08/2016 echo LVEF 65%, normal AVR, no significant MR.   - no issues     2. HTN - compliant with meds  3. Hyperlipidemia - she is compliant with statin  4. CAD - she reports prior MI - CABG 11/2013 SVG-RCA at time of valve surgery - no recent symptoms   5.History of chronic systolic HF, now with normalized LVEF - echo 10/2013 LVEF 30-35%, severe MR - cath 11/2013 without signifaicant CAD - 04/2015 echo LVEF 50-55%  -during 12/2018 admissoin with CVA and COVID +, LVEF found to be decreased 30-35% - increased SOB/DOE - + LE edema. +orthopnea. +abdominal distension - home weight today was 166 lbs.     6. PAF -12/2018 admit with CVA and COVID+, in that setting issues with afib with RVR - has been on coumadin -  rate control may be difficult due to baseline low normal heart rates.   - can have some palpitations -    7. TIA/CVA -  TIA 09/05/16 while in [place]. INR was 2.9 at the time.  - followed by Dr [name]  - admitted with CVA 12/2018       Past Medical History:  Diagnosis Date  . Adjustment disorder 10/17/2005  . Atrial fibrillation ([place])   . Cataract    bilateral  . CHF (congestive heart failure) ([place])   . Contracture of knee joint 07/26/2009  . Coronary artery disease   . Degeneration of lumbar or lumbosacral intervertebral disc 07/26/2009  . Diabetes mellitus without complication ([place])   . Esophageal spasm   . Fibromyalgia   . Hearing loss 12/18/2010  . Heart murmur   .  Hypercholesteremia   . Hypertension   . 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 Leaky heart valve   . Lyme disease   . Macular degeneration of both eyes   . Migraine 10/17/2005  . Mixed incontinence 10/17/2005  . Osteoarthritis    multiple joints   . Overweight 09/19/2010  . Pneumonia   . PONV (postoperative nausea and vomiting)   . Postartificial menopausal syndrome 10/10/2009  . Prinzmetal angina ([place]) 10/17/2005  . Psoriasis 06/12/2006  . Sciatica   . TIA (transient ischemic attack)   . Type II diabetes mellitus ([place])   . Urolith 10/17/2005     Allergies  Allergen Reactions  . Penicillins Anaphylaxis and Other (See Comments)    Has patient had a PCN reaction causing immediate rash, facial/tongue/throat swelling, SOB or lightheadedness with hypotension: Yes Has patient had a PCN reaction causing severe rash involving mucus membranes or skin necrosis: Yes Has patient had a PCN reaction that required hospitalization Yes Has patient had a PCN reaction occurring within the last 10 years: No If all of the above answers are "NO", then may proceed with Cephalosporin use.   . Latex Other (See Comments)    Redness and rash  . Morphine And Related Nausea Only and Other (See Comments)    Dizziness  . Tape Other (See Comments)  Redness and rash  . Iodine Other (See Comments)    Redness and rash     Current Outpatient Medications  Medication Sig Dispense Refill  . acetaminophen (TYLENOL) 650 MG CR tablet Take 650-1,300 mg by mouth every 8 (eight) hours as needed for pain.     Marland Kitchen amLODipine (NORVASC) 5 MG tablet Take 1 tablet (5 mg total) by mouth at bedtime. 90 tablet 1  . aspirin EC 81 MG tablet Take 81 mg by mouth every morning.     Marland Kitchen atorvastatin (LIPITOR) 80 MG tablet TAKE 1 TABLET (80 MG TOTAL) BY MOUTH DAILY. 90 tablet 1  . benzonatate (TESSALON) 100 MG capsule TAKE 1 CAPSULE (100 MG TOTAL) BY MOUTH 2 (TWO) TIMES DAILY AS NEEDED FOR COUGH. 20  capsule 0  . calcium citrate-vitamin D (CITRACAL+D) 315-200 MG-UNIT tablet Take 1 tablet by mouth 2 (two) times daily.    . cetirizine (ZYRTEC) 10 MG chewable tablet Chew 10 mg by mouth daily.    . Continuous Blood Gluc Receiver (FREESTYLE LIBRE 14 DAY READER) DEVI 1 application by Does not apply route 3 (three) times daily. 1 Device 5  . Continuous Blood Gluc Sensor (FREESTYLE LIBRE 14 DAY SENSOR) MISC 1 application by Does not apply route 3 (three) times daily. 1 each 5  . empagliflozin (JARDIANCE) 10 MG TABS tablet Take 10 mg by mouth daily before supper. 90 tablet 1  . fluticasone (FLONASE) 50 MCG/ACT nasal spray USE 2 SPRAYS IN EACH NOSTRIL EVERY DAY 48 g 0  . furosemide (LASIX) 20 MG tablet TAKE 1 TABLET EVERY MORNING AT 8AM 90 tablet 1  . gabapentin (NEURONTIN) 100 MG capsule TAKE 4 CAPSULES EVERY DAY 360 capsule 1  . lisinopril (ZESTRIL) 20 MG tablet Take 1 tablet (20 mg total) by mouth daily. 90 tablet 1  . loperamide (IMODIUM A-D) 2 MG tablet Take 2 mg by mouth as needed for diarrhea or loose stools.    . metFORMIN (GLUCOPHAGE) 1000 MG tablet Take 1 tablet (1,000 mg total) by mouth 2 (two) times daily with a meal. 180 tablet 1  . metoprolol tartrate (LOPRESSOR) 25 MG tablet TAKE 1/2 TABLET TWO TIMES DAILY. 90 tablet 0  . potassium chloride (K-DUR) 10 MEQ tablet TAKE 1 TABLET EVERY DAY 90 tablet 1  . Probiotic Product (FORTIFY DAILY PROBIOTIC PO) Take 1 capsule by mouth daily.     Marland Kitchen tobramycin (TOBREX) 0.3 % ophthalmic solution Place 1 drop into both eyes See admin instructions. Place 1 drop in both eyes four times daily the day before, day of, and day after eye injections.    . Vitamin D, Ergocalciferol, (DRISDOL) 1.25 MG (50000 UT) CAPS capsule Take 1 capsule (50,000 Units total) by mouth every 7 (seven) days. 12 capsule 1  . warfarin (COUMADIN) 6 MG tablet TAKE 1 TABLET EVERY DAY 90 tablet 3  . warfarin (COUMADIN) 6 MG tablet Take 6 mg by mouth See admin instructions. Take 6 mg every  day except Sunday take 3 mg tablet     No current facility-administered medications for this visit.      Past Surgical History:  Procedure Laterality Date  . ABDOMINAL HYSTERECTOMY    . AORTIC VALVE REPLACEMENT  12/10/2013   porcine  . CATARACT EXTRACTION, BILATERAL    . CHOLECYSTECTOMY  1981  . COLONOSCOPY  04/18/2006   Dr. Nelva Nay: internal hemorrhoids  . COLONOSCOPY  10/2003   Dr. Alphonsa Gin: hemorrhoids  . CORONARY ARTERY BYPASS GRAFT  2015  . ESOPHAGOGASTRODUODENOSCOPY  10/16/2013   Dr. Marla Roe: prior Nissen fundoplication intact, hypertonic LES, dilated up to 88 [place] with moderate resistance, gastritis but no H. pylori., Reactive gastritis, no celiac disease.  . ESOPHAGOGASTRODUODENOSCOPY  05/29/2012   Dr. Doy Mince: Moderately severe esophagitis, acute gastritis reactive, no H pylori, no celiac. Esophageal biopsies consistent with GERD, no Barrett  . ESOPHAGOGASTRODUODENOSCOPY  03/21/2009   Dr. Doy Mince: Reflux esophagitis, gastritis without H. pylori, esophagus stretched 57 savory  . ESOPHAGOGASTRODUODENOSCOPY  02/20/2008   Dr. Doy Mince: Esophagus dilated to 68 [place], reactive gastropathy with no H pylori. No Barrett's on esophageal biopsy  . ESOPHAGOGASTRODUODENOSCOPY  09/24/2006   Dr. Doy Mince: Tight wrap noted, reactive gastropathy, no Barrett's  . ESOPHAGOGASTRODUODENOSCOPY (EGD) WITH PROPOFOL N/A 01/09/2017   Procedure: ESOPHAGOGASTRODUODENOSCOPY (EGD) WITH PROPOFOL;  Surgeon: Daneil Dolin, MD;  Location: [facility];  Service: Endoscopy;  Laterality: N/A;  2:15PM  . FRACTURE SURGERY Left    wrist  . JOINT REPLACEMENT    . MALONEY DILATION N/A 01/09/2017   Procedure: Venia Minks DILATION;  Surgeon: Daneil Dolin, MD;  Location: [facility];  Service: Endoscopy;  Laterality: N/A;  . MITRAL VALVE REPAIR  11/2013  . NISSEN FUNDOPLICATION  [id number]  . REPLACEMENT TOTAL KNEE BILATERAL  2010/2013     Allergies  Allergen Reactions  . Penicillins Anaphylaxis  and Other (See Comments)    Has patient had a PCN reaction causing immediate rash, facial/tongue/throat swelling, SOB or lightheadedness with hypotension: Yes Has patient had a PCN reaction causing severe rash involving mucus membranes or skin necrosis: Yes Has patient had a PCN reaction that required hospitalization Yes Has patient had a PCN reaction occurring within the last 10 years: No If all of the above answers are "NO", then may proceed with Cephalosporin use.   . Latex Other (See Comments)    Redness and rash  . Morphine And Related Nausea Only and Other (See Comments)    Dizziness  . Tape Other (See Comments)    Redness and rash  . Iodine Other (See Comments)    Redness and rash      Family History  Problem Relation Age of Onset  . Stroke Mother   . Heart failure Mother   . Heart disease Mother   . Mitral valve prolapse Mother   . Diabetes Father   . Heart attack Father   . Stroke Father   . Heart disease Father   . Hypertension Father   . Alzheimer's disease Father   . Stroke Maternal Grandmother   . Arthritis Maternal Grandfather        hands  . Breast cancer Paternal Grandmother   . Osteoporosis Paternal Grandmother   . Breast cancer Cousin   . Colon cancer Neg Hx      Social History Ms. Tubbs reports that she quit smoking about 52 years ago. Her smoking use included cigarettes. She has a 20.00 pack-year smoking history. She has never used smokeless tobacco. Ms. Neally reports no history of alcohol use.   Review of Systems CONSTITUTIONAL: No weight loss, fever, chills, weakness or fatigue.  HEENT: Eyes: No visual loss, blurred vision, double vision or yellow sclerae.No hearing loss, sneezing, congestion, runny nose or sore throat.  SKIN: No rash or itching.  CARDIOVASCULAR: per hpi RESPIRATORY: No shortness of breath, cough or sputum.  GASTROINTESTINAL: No anorexia, nausea, vomiting or diarrhea. No abdominal pain or blood.  GENITOURINARY: No burning  on urination, no polyuria NEUROLOGICAL: No headache, dizziness, syncope, paralysis, ataxia, numbness or  tingling in the extremities. No change in bowel or bladder control.  MUSCULOSKELETAL: No muscle, back pain, joint pain or stiffness.  LYMPHATICS: No enlarged nodes. No history of splenectomy.  PSYCHIATRIC: No history of depression or anxiety.  ENDOCRINOLOGIC: No reports of sweating, cold or heat intolerance. No polyuria or polydipsia.  Marland Kitchen   Physical Examination Today's Vitals   01/30/19 1458  BP: 128/72  Pulse: 96  SpO2: 98%  Weight: 168 lb (76.2 kg)  Height: 5\' 2"  (1.575 m)   Body mass index is 30.73 kg/m.  Gen: resting comfortably, no acute distress HEENT: no scleral icterus, pupils equal round and reactive, no palptable cervical adenopathy,  CV: RRR, no m/rg, no jvd Resp: Clear to auscultation bilaterally GI: abdomen is soft, non-tender, non-distended, normal bowel sounds, no hepatosplenomegaly MSK: extremities are warm, no edema.  Skin: warm, no rash Neuro:  no focal deficits Psych: appropriate affect   Diagnostic Studies 04/2015 echo Study Conclusions  - Left ventricle: The cavity size was normal. Wall thickness was  increased in a pattern of mild LVH. Systolic function was low  normal. The estimated ejection fraction was in the range of 50%  to 55%. Wall motion was normal; there were no regional wall  motion abnormalities. The study is not technically sufficient to  allow evaluation of LV diastolic function. - Aortic valve: Normally functioning bioprosthetic aortic valve  noted. There was no stenosis. There was no regurgitation. Peak  velocity (S): 282 cm/s. Mean gradient (S): 17 mm Hg. - Aorta: Mild ascending aortic dilatation. Maximal diameter 3.73  cm. - Mitral valve: S/p mitral valve repair. There was mild  regurgitation. Valve area by pressure half-time: 1.39 cm^2. - Left atrium: The atrium was moderately to severely dilated. - Right ventricle:  Systolic function was mildly reduced. - Tricuspid valve: There was mild regurgitation. - Pulmonary arteries: Systolic pressure was mildly increased. PA  peak pressure: 33 mm Hg (S).  04/2015 Carotid US IMPRESSION: 1. Moderate right carotid bifurcation atherosclerotic vascular disease. Visually degree of stenosis in the 50-69% range. Elevation of flow velocity ratios also present.  2. Mild left carotid bifurcation atherosclerotic vascular disease. Degree of stenosis less than 50%.  3. Vertebral arteries are patent with antegrade flow.    Assessment and Plan   1. Valvular heart disease - s/p tissue AVR and MV ring 11/2013 - echo 04/2015 with normal valve function - continue to monitor  2. HTN - bp at goal, continue current meds   3. Chronic systolic HF - LVEF previously normalized s/p valve surgery and CABG - recurrent drop in LVEF in setting of COVID pneumonia - stop lopresor, start coreg 6.25mg  bid. Some fluid overload, increase lasix to 40mg  daily. Check BMET/Mg in 2 weeks.  Continue lisinopril for now but may change to entresto in near future, would repeat echo after next visit to decide. There is a chance this was an isolated stress induced CM and she may have rapid recovery.    3. PAF - . CHADS2Vasc score of 7, continue coumadin.She has also been on ASA due to prior recurrent TIAs - continue current meds   F/u 3 weeks Arnoldo Lenis, M.D

## 2019-01-30 NOTE — Patient Instructions (Signed)
Your physician recommends that you schedule a follow-up appointment in: [place] has recommended you make the following change in your medication:   STOP METOPROLOL   START COREG 6.25 MG TWICE DAILY   INCREASE LASIX 40 MG DAILY   Your physician recommends that you return for lab work in: 2 WEEKS BMP/MG  Thank you for choosing [facility]!!

## 2019-02-12 ENCOUNTER — Telehealth: Payer: Self-pay | Admitting: Sports Medicine

## 2019-02-12 NOTE — Telephone Encounter (Signed)
Called per Liliane Channel to let pt know that we have not received signed paperwork from Dr Mariea Clonts yet.

## 2019-02-17 ENCOUNTER — Other Ambulatory Visit (HOSPITAL_COMMUNITY)
Admission: RE | Admit: 2019-02-17 | Discharge: 2019-02-17 | Disposition: A | Discharge status: Home / Self Care | Payer: Medicare Other | Source: Ambulatory Visit | Attending: Cardiology | Admitting: Cardiology

## 2019-02-17 ENCOUNTER — Other Ambulatory Visit: Payer: Self-pay

## 2019-02-17 DIAGNOSIS — I4891 Unspecified atrial fibrillation: Secondary | ICD-10-CM | POA: Insufficient documentation

## 2019-02-17 LAB — BASIC METABOLIC PANEL
Anion gap: 10 (ref 5–15)
BUN: 20 mg/dL (ref 8–23)
CO2: 25 mmol/L (ref 22–32)
Calcium: 9.4 mg/dL (ref 8.9–10.3)
Chloride: 106 mmol/L (ref 98–111)
Creatinine, Ser: 0.89 mg/dL (ref 0.44–1.00)
GFR calc Af Amer: >60 mL/min (ref >60)
GFR calc non Af Amer: >60 mL/min (ref >60)
Glucose, Bld: 114 mg/dL — ABNORMAL HIGH (ref 70–99)
Potassium: 3.3 mmol/L — ABNORMAL LOW (ref 3.5–5.1)
Sodium: 141 mmol/L (ref 135–145)

## 2019-02-17 LAB — MAGNESIUM: Magnesium: 1.9 mg/dL (ref 1.7–2.4)

## 2019-02-18 ENCOUNTER — Telehealth (INDEPENDENT_AMBULATORY_CARE_PROVIDER_SITE_OTHER): Payer: Medicare Other | Admitting: Internal Medicine

## 2019-02-18 ENCOUNTER — Telehealth: Payer: Self-pay | Admitting: *Deleted

## 2019-02-18 DIAGNOSIS — Z7901 Long term (current) use of anticoagulants: Secondary | ICD-10-CM

## 2019-02-18 DIAGNOSIS — Z952 Presence of prosthetic heart valve: Secondary | ICD-10-CM | POA: Diagnosis not present

## 2019-02-18 DIAGNOSIS — Z87891 Personal history of nicotine dependence: Secondary | ICD-10-CM

## 2019-02-18 DIAGNOSIS — Z8679 Personal history of other diseases of the circulatory system: Secondary | ICD-10-CM

## 2019-02-18 DIAGNOSIS — Z8619 Personal history of other infectious and parasitic diseases: Secondary | ICD-10-CM | POA: Diagnosis not present

## 2019-02-18 DIAGNOSIS — I10 Essential (primary) hypertension: Secondary | ICD-10-CM

## 2019-02-18 DIAGNOSIS — I4819 Other persistent atrial fibrillation: Secondary | ICD-10-CM | POA: Diagnosis not present

## 2019-02-18 DIAGNOSIS — Z8673 Personal history of transient ischemic attack (TIA), and cerebral infarction without residual deficits: Secondary | ICD-10-CM | POA: Diagnosis not present

## 2019-02-18 DIAGNOSIS — I5022 Chronic systolic (congestive) heart failure: Secondary | ICD-10-CM

## 2019-02-18 MED ORDER — POTASSIUM CHLORIDE CRYS ER 20 MEQ PO TBCR: 20.0000 meq | EXTENDED_RELEASE_TABLET | Freq: Every day | ORAL | 1 refills | Status: DC
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
## 2019-02-18 NOTE — Telephone Encounter (Signed)
Pt voiced understanding - updated medication list and lab orders placed - routed to pcp

## 2019-02-18 NOTE — Telephone Encounter (Signed)
-----   Message from Herminio Commons, MD sent at 02/17/2019 12:26 PM EST ----- K mildly low. Increase KCl to 20 meq daily and repeat BMET in 1 week.

## 2019-02-24 ENCOUNTER — Other Ambulatory Visit: Payer: Self-pay

## 2019-02-24 ENCOUNTER — Ambulatory Visit (INDEPENDENT_AMBULATORY_CARE_PROVIDER_SITE_OTHER): Payer: Medicare Other | Admitting: *Deleted

## 2019-02-24 ENCOUNTER — Other Ambulatory Visit (HOSPITAL_COMMUNITY)
Admission: RE | Admit: 2019-02-24 | Discharge: 2019-02-24 | Disposition: A | Discharge status: Home / Self Care | Payer: Medicare Other | Source: Ambulatory Visit | Attending: Cardiology | Admitting: Cardiology

## 2019-02-24 DIAGNOSIS — Z8673 Personal history of transient ischemic attack (TIA), and cerebral infarction without residual deficits: Secondary | ICD-10-CM | POA: Diagnosis not present

## 2019-02-24 DIAGNOSIS — I4891 Unspecified atrial fibrillation: Secondary | ICD-10-CM | POA: Insufficient documentation

## 2019-02-24 DIAGNOSIS — Z5181 Encounter for therapeutic drug level monitoring: Secondary | ICD-10-CM

## 2019-02-24 LAB — BASIC METABOLIC PANEL
Anion gap: 11 (ref 5–15)
BUN: 17 mg/dL (ref 8–23)
CO2: 24 mmol/L (ref 22–32)
Calcium: 9.1 mg/dL (ref 8.9–10.3)
Chloride: 107 mmol/L (ref 98–111)
Creatinine, Ser: 0.97 mg/dL (ref 0.44–1.00)
GFR calc Af Amer: >60 mL/min (ref >60)
GFR calc non Af Amer: 57 mL/min — ABNORMAL LOW (ref >60)
Glucose, Bld: 180 mg/dL — ABNORMAL HIGH (ref 70–99)
Potassium: 4.2 mmol/L (ref 3.5–5.1)
Sodium: 142 mmol/L (ref 135–145)

## 2019-02-24 LAB — MAGNESIUM: Magnesium: 2 mg/dL (ref 1.7–2.4)

## 2019-02-24 LAB — POCT INR: INR: 3.5 — AB (ref 2.0–3.0)

## 2019-02-24 NOTE — Patient Instructions (Signed)
Took coumadin this morning.  Pending DCCV.  Not scheduled yet.  Has appt with Dr Harl Bowie 03/05/19 Hold warfarin tomorrow then resume 1 tablet daily except 1/2 tablet on Sundays Recheck in 1 week

## 2019-02-25 ENCOUNTER — Telehealth: Payer: Self-pay | Admitting: Cardiology

## 2019-02-25 MED ORDER — FUROSEMIDE 40 MG PO TABS: 40.0000 mg | ORAL_TABLET | Freq: Two times a day (BID) | ORAL | 1 refills | Status: DC

## 2019-02-25 NOTE — Telephone Encounter (Signed)
Change lasix to 40mg  bid, update [place] on weights and symptoms on Monday. Lasix just just to be taken 6 hours apart, does not have to take second dose late in the evening. If took first dose at 8AM could next dose as early as 2pm   Zandra Abts MD

## 2019-02-25 NOTE — Telephone Encounter (Signed)
Called pt's daughter. Pt's weight is up to 170 lbs. She is currently taking lasix 40 mg daily. She feels that the lasix wears off by afternoon, and her mother stop urinating. She gets more SOB as the evening goes. She gets SOB when she does any type of moving, talking or lying flat. Please advise.

## 2019-02-25 NOTE — Telephone Encounter (Signed)
Pt's daughter made aware. Sent new RX in for lasix 40 mg BID.

## 2019-02-25 NOTE — Telephone Encounter (Signed)
Pt's daughter Jeanette Yates left a message stating the medication adjustments are not working- states the patient is still having swelling and having shortness of breath with just carrying on a conversation.   Please call [phone]

## 2019-02-25 NOTE — Telephone Encounter (Signed)
I will forward to Dr. Harl Bowie to advise.

## 2019-02-25 NOTE — Telephone Encounter (Signed)
Pt's daughter [name] has called back wanting to know what she should do for her Mom. The patient is still have problems with the swelling and the breathing. The lasix is starting to wear off at this time of day.   [phone]

## 2019-02-25 NOTE — Telephone Encounter (Signed)
I will forward to Dr Branch 

## 2019-02-26 ENCOUNTER — Telehealth: Payer: Self-pay | Admitting: *Deleted

## 2019-02-26 NOTE — Telephone Encounter (Signed)
-----   Message from Arnoldo Lenis, MD sent at 02/24/2019  2:25 PM EST ----- Labs look good   Zandra Abts MD

## 2019-02-26 NOTE — Telephone Encounter (Signed)
Pt aware - routed to pcp  

## 2019-03-02 ENCOUNTER — Other Ambulatory Visit: Payer: Self-pay

## 2019-03-02 ENCOUNTER — Ambulatory Visit (INDEPENDENT_AMBULATORY_CARE_PROVIDER_SITE_OTHER): Payer: Medicare Other | Admitting: Physician Assistant

## 2019-03-02 ENCOUNTER — Ambulatory Visit: Payer: Medicare Other | Admitting: Physician Assistant

## 2019-03-02 ENCOUNTER — Other Ambulatory Visit (HOSPITAL_COMMUNITY)
Admission: RE | Admit: 2019-03-02 | Discharge: 2019-03-02 | Disposition: A | Discharge status: Home / Self Care | Payer: Medicare Other | Source: Ambulatory Visit | Attending: Physician Assistant | Admitting: Physician Assistant

## 2019-03-02 ENCOUNTER — Encounter: Payer: Self-pay | Admitting: Physician Assistant

## 2019-03-02 VITALS — BP 123/75 | HR 82 | Temp 97.5°F | Ht 62.0 in | Wt 165.2 lb

## 2019-03-02 DIAGNOSIS — I10 Essential (primary) hypertension: Secondary | ICD-10-CM | POA: Diagnosis not present

## 2019-03-02 DIAGNOSIS — I2510 Atherosclerotic heart disease of native coronary artery without angina pectoris: Secondary | ICD-10-CM

## 2019-03-02 DIAGNOSIS — I639 Cerebral infarction, unspecified: Secondary | ICD-10-CM

## 2019-03-02 DIAGNOSIS — I4819 Other persistent atrial fibrillation: Secondary | ICD-10-CM | POA: Diagnosis not present

## 2019-03-02 DIAGNOSIS — Z8673 Personal history of transient ischemic attack (TIA), and cerebral infarction without residual deficits: Secondary | ICD-10-CM | POA: Diagnosis not present

## 2019-03-02 DIAGNOSIS — Z952 Presence of prosthetic heart valve: Secondary | ICD-10-CM | POA: Diagnosis not present

## 2019-03-02 DIAGNOSIS — I5022 Chronic systolic (congestive) heart failure: Secondary | ICD-10-CM | POA: Diagnosis not present

## 2019-03-02 LAB — BASIC METABOLIC PANEL
Anion gap: 11 (ref 5–15)
BUN: 22 mg/dL (ref 8–23)
CO2: 26 mmol/L (ref 22–32)
Calcium: 9.1 mg/dL (ref 8.9–10.3)
Chloride: 102 mmol/L (ref 98–111)
Creatinine, Ser: 1.01 mg/dL — ABNORMAL HIGH (ref 0.44–1.00)
GFR calc Af Amer: >60 mL/min (ref >60)
GFR calc non Af Amer: 54 mL/min — ABNORMAL LOW (ref >60)
Glucose, Bld: 164 mg/dL — ABNORMAL HIGH (ref 70–99)
Potassium: 3.9 mmol/L (ref 3.5–5.1)
Sodium: 139 mmol/L (ref 135–145)

## 2019-03-02 NOTE — Patient Instructions (Signed)
Medication Instructions:  Your physician recommends that you continue on your current medications as directed. Please refer to the Current Medication list given to you today.  *If you need a refill on your cardiac medications before your next appointment, please call your pharmacy*  Lab Work: Your physician recommends that you return for lab work in: Today   If you have labs (blood work) drawn today and your tests are completely normal, you will receive your results only by: Marland Kitchen MyChart Message (if you have MyChart) OR . A paper copy in the mail If you have any lab test that is abnormal or we need to change your treatment, we will call you to review the results.  Testing/Procedures: NONE   Follow-Up: At [facility], you and your health needs are our priority.  As part of our continuing mission to provide you with exceptional heart care, we have created designated Provider Care Teams.  These Care Teams include your primary Cardiologist (physician) and Advanced Practice Providers (APPs -  Physician Assistants and Nurse Practitioners) who all work together to provide you with the care you need, when you need it.  Your next appointment:    Keep appointment with Dr. Harl Bowie   The format for your next appointment:   In Person  Provider:   Carlyle Dolly, MD  Other Instructions Thank you for choosing [place] [facility]!   Low-Sodium Eating Plan Sodium, which is an element that makes up salt, helps you maintain a healthy balance of fluids in your body. Too much sodium can increase your blood pressure and cause fluid and waste to be held in your body. Your health care provider or dietitian may recommend following this plan if you have high blood pressure (hypertension), kidney disease, liver disease, or heart failure. Eating less sodium can help lower your blood pressure, reduce swelling, and protect your heart, liver, and kidneys. What are tips for following this plan? General  guidelines  Most people on this plan should limit their sodium intake to 1,500-2,000 mg (milligrams) of sodium each day. Reading food labels   The Nutrition Facts label lists the amount of sodium in one serving of the food. If you eat more than one serving, you must multiply the listed amount of sodium by the number of servings.  Choose foods with less than 140 mg of sodium per serving.  Avoid foods with 300 mg of sodium or more per serving. Shopping  Look for lower-sodium products, often labeled as "low-sodium" or "no salt added."  Always check the sodium content even if foods are labeled as "unsalted" or "no salt added".  Buy fresh foods. ? Avoid canned foods and premade or frozen meals. ? Avoid canned, cured, or processed meats  Buy breads that have less than 80 mg of sodium per slice. Cooking  Eat more home-cooked food and less restaurant, buffet, and fast food.  Avoid adding salt when cooking. Use salt-free seasonings or herbs instead of table salt or sea salt. Check with your health care provider or pharmacist before using salt substitutes.  Cook with plant-based oils, such as canola, sunflower, or olive oil. Meal planning  When eating at a restaurant, ask that your food be prepared with less salt or no salt, if possible.  Avoid foods that contain MSG (monosodium glutamate). MSG is sometimes added to [place] food, bouillon, and some canned foods. What foods are recommended? The items listed may not be a complete list. Talk with your dietitian about what dietary choices are best  for you. Grains Low-sodium cereals, including oats, puffed wheat and rice, and shredded wheat. Low-sodium crackers. Unsalted rice. Unsalted pasta. Low-sodium bread. Whole-grain breads and whole-grain pasta. Vegetables Fresh or frozen vegetables. "No salt added" canned vegetables. "No salt added" tomato sauce and paste. Low-sodium or reduced-sodium tomato and vegetable juice. Fruits Fresh, frozen,  or canned fruit. Fruit juice. Meats and other protein foods Fresh or frozen (no salt added) meat, poultry, seafood, and fish. Low-sodium canned tuna and salmon. Unsalted nuts. Dried peas, beans, and lentils without added salt. Unsalted canned beans. Eggs. Unsalted nut butters. Dairy Milk. Soy milk. Cheese that is naturally low in sodium, such as ricotta cheese, fresh mozzarella, or Swiss cheese Low-sodium or reduced-sodium cheese. Cream cheese. Yogurt. Fats and oils Unsalted butter. Unsalted margarine with no trans fat. Vegetable oils such as canola or olive oils. Seasonings and other foods Fresh and dried herbs and spices. Salt-free seasonings. Low-sodium mustard and ketchup. Sodium-free salad dressing. Sodium-free light mayonnaise. Fresh or refrigerated horseradish. Lemon juice. Vinegar. Homemade, reduced-sodium, or low-sodium soups. Unsalted popcorn and pretzels. Low-salt or salt-free chips. What foods are not recommended? The items listed may not be a complete list. Talk with your dietitian about what dietary choices are best for you. Grains Instant hot cereals. Bread stuffing, pancake, and biscuit mixes. Croutons. Seasoned rice or pasta mixes. Noodle soup cups. Boxed or frozen macaroni and cheese. Regular salted crackers. Self-rising flour. Vegetables Sauerkraut, pickled vegetables, and relishes. Olives. [place] fries. Onion rings. Regular canned vegetables (not low-sodium or reduced-sodium). Regular canned tomato sauce and paste (not low-sodium or reduced-sodium). Regular tomato and vegetable juice (not low-sodium or reduced-sodium). Frozen vegetables in sauces. Meats and other protein foods Meat or fish that is salted, canned, smoked, spiced, or pickled. Bacon, ham, sausage, hotdogs, corned beef, chipped beef, packaged lunch meats, salt pork, jerky, pickled herring, anchovies, regular canned tuna, sardines, salted nuts. Dairy Processed cheese and cheese spreads. Cheese curds. Blue cheese.  Feta cheese. String cheese. Regular cottage cheese. Buttermilk. Canned milk. Fats and oils Salted butter. Regular margarine. Ghee. Bacon fat. Seasonings and other foods Onion salt, garlic salt, seasoned salt, table salt, and sea salt. Canned and packaged gravies. Worcestershire sauce. Tartar sauce. Barbecue sauce. Teriyaki sauce. Soy sauce, including reduced-sodium. Steak sauce. Fish sauce. Oyster sauce. Cocktail sauce. Horseradish that you find on the shelf. Regular ketchup and mustard. Meat flavorings and tenderizers. Bouillon cubes. Hot sauce and Tabasco sauce. Premade or packaged marinades. Premade or packaged taco seasonings. Relishes. Regular salad dressings. Salsa. Potato and tortilla chips. Corn chips and puffs. Salted popcorn and pretzels. Canned or dried soups. Pizza. Frozen entrees and pot pies. Summary  Eating less sodium can help lower your blood pressure, reduce swelling, and protect your heart, liver, and kidneys.  Most people on this plan should limit their sodium intake to 1,500-2,000 mg (milligrams) of sodium each day.  Canned, boxed, and frozen foods are high in sodium. Restaurant foods, fast foods, and pizza are also very high in sodium. You also get sodium by adding salt to food.  Try to cook at home, eat more fresh fruits and vegetables, and eat less fast food, canned, processed, or prepared foods. This information is not intended to replace advice given to you by your health care provider. Make sure you discuss any questions you have with your health care provider. Document Released: 09/01/2001 Document Revised: 02/22/2017 Document Reviewed: 03/05/2016 Elsevier Patient Education  2020 [employer].

## 2019-03-02 NOTE — Progress Notes (Signed)
Cardiology Office Note    Date:  69/06/8544   ID:  Jeanette Yates, Jeanette Yates [birth year], MRN [id number]  PCP:  Gayland Curry, DO  Cardiologist: Carlyle Dolly, MD EPS: Thompson Grayer, MD  Chief Complaint  Patient presents with  . Follow-up    History of Present Illness:  Jeanette Yates is a 78 y.o. female with history of CAD status post CABG with SVG to the RCA [place] 2 porcine aortic valve replacement, MV repair with Medtronic 3D ring 05/7167 complicated by pleural effusion and A. fib.  Echo 08/2016 LVEF 65% normal AVR no significant MR.-Previous cardiomyopathy.  History of TIA/CVA 08/2016 INR is 2.9 at that time  Patient was admitted 12/2018 with CVA and Covid positive LVEF found to be decreased at 30 to 35%.  Also had A. fib with RVR and placed on Coumadin.  Rate difficult to control due to baseline low normal heart rates.  Patient saw Dr. Harl Bowie 01/30/2019 at which time he stopped Lopressor and started Coreg 6.25 mg twice daily, increase Lasix to 60 mg daily and continued lisinopril with consideration of Entresto in the future.  Want to repeat echo before deciding this as this could be a stress induced cardiomyopathy and she may have rapid recovery.  CHA2DS2-VASc score is 7 on Coumadin as well as aspirin  Are already in the A. fib clinic 02/18/2019 which time he was worried her A. fib was the cause of her stroke, reduced EF and CHF symptoms.  He recommended pursuing rhythm control and discussed options of cardioversion Tikosyn and amiodarone.  Patient went to attempt cardioversion without any further antiarrhythmic drugs.  Recommended holding off on evaluating EF until after she was in normal sinus rhythm and meds were optimized.  Lasix was increased to 40 mg twice daily 02/25/2019 after weight gain and increased shortness of breath. This increase has dramatically helped her breathing. Weight on home scale went from 170 lbs down to 10 lbs and breathing much better and quicker recovery. She is  getting extra salt in her diet-trying a plant based diet but getting a lot of frozen dinners/processed foods. She had a dizzy spell yesterday at rest associated with palpitations-woke from a nap a little confused and startled. Lasted about 10 min. Wants to talk to Dr. Harl Bowie about DCCV before she agrees.    Past Medical History:  Diagnosis Date  . Adjustment disorder 10/17/2005  . Atrial fibrillation ([place])   . Cataract    bilateral  . CHF (congestive heart failure) ([place])   . Contracture of knee joint 07/26/2009  . Coronary artery disease   . Degeneration of lumbar or lumbosacral intervertebral disc 07/26/2009  . Diabetes mellitus without complication ([place])   . Esophageal spasm   . Fibromyalgia   . Hearing loss 12/18/2010  . Heart murmur   . Hypercholesteremia   . Hypertension   . 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 Leaky heart valve   . Lyme disease   . Macular degeneration of both eyes   . Migraine 10/17/2005  . Mixed incontinence 10/17/2005  . Osteoarthritis    multiple joints   . Overweight 09/19/2010  . Pneumonia   . PONV (postoperative nausea and vomiting)   . Postartificial menopausal syndrome 10/10/2009  . Prinzmetal angina ([place]) 10/17/2005  . Psoriasis 06/12/2006  . Sciatica   . TIA (transient ischemic attack)   . Type II diabetes mellitus ([place])   . [name] 10/17/2005    Past Surgical History:  Procedure Laterality Date  . ABDOMINAL HYSTERECTOMY    . AORTIC VALVE REPLACEMENT  12/10/2013   porcine  . CATARACT EXTRACTION, BILATERAL    . CHOLECYSTECTOMY  1981  . COLONOSCOPY  04/18/2006   Dr. Nelva Nay: internal hemorrhoids  . COLONOSCOPY  10/2003   Dr. Alphonsa Gin: hemorrhoids  . CORONARY ARTERY BYPASS GRAFT  2015  . ESOPHAGOGASTRODUODENOSCOPY  10/16/2013   Dr. Marla Roe: prior Nissen fundoplication intact, hypertonic LES, dilated up to 19 [place] with moderate resistance, gastritis but no H. pylori., Reactive gastritis, no celiac  disease.  . ESOPHAGOGASTRODUODENOSCOPY  05/29/2012   Dr. Doy Mince: Moderately severe esophagitis, acute gastritis reactive, no H pylori, no celiac. Esophageal biopsies consistent with GERD, no Barrett  . ESOPHAGOGASTRODUODENOSCOPY  03/21/2009   Dr. Doy Mince: Reflux esophagitis, gastritis without H. pylori, esophagus stretched 57 savory  . ESOPHAGOGASTRODUODENOSCOPY  02/20/2008   Dr. Doy Mince: Esophagus dilated to 70 French, reactive gastropathy with no H pylori. No Barrett's on esophageal biopsy  . ESOPHAGOGASTRODUODENOSCOPY  09/24/2006   Dr. Doy Mince: Tight wrap noted, reactive gastropathy, no Barrett's  . ESOPHAGOGASTRODUODENOSCOPY (EGD) WITH PROPOFOL N/A 01/09/2017   Procedure: ESOPHAGOGASTRODUODENOSCOPY (EGD) WITH PROPOFOL;  Surgeon: Daneil Dolin, MD;  Location: [facility];  Service: Endoscopy;  Laterality: N/A;  2:15PM  . FRACTURE SURGERY Left    wrist  . JOINT REPLACEMENT    . MALONEY DILATION N/A 01/09/2017   Procedure: Venia Minks DILATION;  Surgeon: Daneil Dolin, MD;  Location: [facility];  Service: Endoscopy;  Laterality: N/A;  . MITRAL VALVE REPAIR  11/2013  . NISSEN FUNDOPLICATION  5638  . REPLACEMENT TOTAL KNEE BILATERAL  2010/2013    Current Medications: Current Meds  Medication Sig  . acetaminophen (TYLENOL) 650 MG CR tablet Take 650-1,300 mg by mouth every 8 (eight) hours as needed for pain.   Marland Kitchen amLODipine (NORVASC) 5 MG tablet Take 1 tablet (5 mg total) by mouth at bedtime.  Marland Kitchen aspirin EC 81 MG tablet Take 81 mg by mouth every morning.   Marland Kitchen atorvastatin (LIPITOR) 80 MG tablet TAKE 1 TABLET (80 MG TOTAL) BY MOUTH DAILY.  . calcium citrate-vitamin D (CITRACAL+D) 315-200 MG-UNIT tablet Take 1 tablet by mouth 2 (two) times daily.  . carvedilol (COREG) 6.25 MG tablet Take 1 tablet (6.25 mg total) by mouth 2 (two) times daily.  . cetirizine (ZYRTEC) 10 MG chewable tablet Chew 10 mg by mouth daily.  . Continuous Blood Gluc Receiver (FREESTYLE LIBRE 14 DAY READER) DEVI 1  application by Does not apply route 3 (three) times daily.  . Continuous Blood Gluc Sensor (FREESTYLE LIBRE 14 DAY SENSOR) MISC 1 application by Does not apply route 3 (three) times daily. E11.40  . empagliflozin (JARDIANCE) 10 MG TABS tablet Take 10 mg by mouth daily before supper.  . fluticasone (FLONASE) 50 MCG/ACT nasal spray USE 2 SPRAYS IN EACH NOSTRIL EVERY DAY  . furosemide (LASIX) 40 MG tablet Take 1 tablet (40 mg total) by mouth 2 (two) times daily.  Marland Kitchen gabapentin (NEURONTIN) 100 MG capsule TAKE 4 CAPSULES EVERY DAY  . lisinopril (ZESTRIL) 20 MG tablet Take 1 tablet (20 mg total) by mouth daily.  Marland Kitchen loperamide (IMODIUM A-D) 2 MG tablet Take 2 mg by mouth as needed for diarrhea or loose stools.  . metFORMIN (GLUCOPHAGE) 1000 MG tablet Take 1 tablet (1,000 mg total) by mouth 2 (two) times daily with a meal.  . potassium chloride SA (KLOR-CON) 20 MEQ tablet Take 1 tablet (20 mEq total) by mouth daily.  Marland Kitchen  Probiotic Product (FORTIFY DAILY PROBIOTIC PO) Take 1 capsule by mouth daily.   Marland Kitchen tobramycin (TOBREX) 0.3 % ophthalmic solution Place 1 drop into both eyes See admin instructions. Place 1 drop in both eyes four times daily the day before, day of, and day after eye injections.  . Vitamin D, Ergocalciferol, (DRISDOL) 1.25 MG (50000 UT) CAPS capsule Take 1 capsule (50,000 Units total) by mouth every 7 (seven) days.  Marland Kitchen warfarin (COUMADIN) 6 MG tablet TAKE 1 TABLET EVERY DAY  . warfarin (COUMADIN) 6 MG tablet Take 6 mg by mouth See admin instructions. Take 6 mg every day except Sunday take 3 mg tablet     Allergies:   Penicillins, Latex, Morphine and related, Tape, and Iodine   Social History   Socioeconomic History  . Marital status: Widowed    Spouse name: Not on file  . Number of children: 3  . Years of education: Not on file  . Highest education level: Not on file  Occupational History  . Not on file  Social Needs  . Financial resource strain: Not hard at all  . Food insecurity     Worry: Never true    Inability: Never true  . Transportation needs    Medical: No    Non-medical: No  Tobacco Use  . Smoking status: Former Smoker    Packs/day: 1.00    Years: 20.00    Pack years: 20.00    Types: Cigarettes    Quit date: 09/12/1966    Years since quitting: 52.5  . Smokeless tobacco: Never Used  Substance and Sexual Activity  . Alcohol use: No  . Drug use: No  . Sexual activity: Never  Lifestyle  . Physical activity    Days per week: 6 days    Minutes per session: 30 min  . Stress: Only a little  Relationships  . Social connections    Talks on phone: More than three times a week    Gets together: More than three times a week    Attends religious service: More than 4 times per year    Active member of club or organization: No    Attends meetings of clubs or organizations: Never    Relationship status: Widowed  Other Topics Concern  . Not on file  Social History Narrative  . Not on file     Family History:  The patient's   family history includes Alzheimer's disease in her father; Arthritis in her maternal grandfather; Breast cancer in her cousin and paternal grandmother; Diabetes in her father; Heart attack in her father; Heart disease in her father and mother; Heart failure in her mother; Hypertension in her father; Mitral valve prolapse in her mother; Osteoporosis in her paternal grandmother; Stroke in her father, maternal grandmother, and mother.   ROS:   Please see the history of present illness.    ROS All other systems reviewed and are negative.   PHYSICAL EXAM:   VS:  BP 123/75   Pulse 82   Temp (!) 97.5 F (36.4 C) (Temporal)   Ht '5\' 2"'  (1.575 m)   Wt 165 lb 3.2 oz (74.9 kg)   SpO2 99%   BMI 30.22 kg/m   Physical Exam  GEN: Well nourished, well developed, in no acute distress  Neck: no JVD, carotid bruits, or masses Cardiac:RRR; no murmurs, rubs, or gallops  Respiratory:  clear to auscultation bilaterally, normal work of breathing GI:  soft, nontender, nondistended, + BS Ext: without cyanosis, clubbing, or  edema, Good distal pulses bilaterally Neuro:  Alert and Oriented x 3 Psych: euthymic mood, full affect  Wt Readings from Last 3 Encounters:  03/02/19 165 lb 3.2 oz (74.9 kg)  01/30/19 168 lb (76.2 kg)  01/14/19 169 lb 9.6 oz (76.9 kg)      Studies/Labs Reviewed:   EKG:  EKG is not ordered today.    Recent Labs: 09/04/2018: ALT 14 12/24/2018: Hemoglobin 12.2; Platelets 375 02/24/2019: BUN 17; Creatinine, Ser 0.97; Magnesium 2.0; Potassium 4.2; Sodium 142   Lipid Panel    Component Value Date/Time   CHOL 120 12/25/2018 0504   TRIG 120 12/25/2018 0504   HDL 39 (L) 12/25/2018 0504   CHOLHDL 3.1 12/25/2018 0504   VLDL 24 12/25/2018 0504   LDLCALC 57 12/25/2018 0504   LDLCALC 74 09/04/2018 0939    Additional studies/ records that were reviewed today include:  2D echo 9/30/2020IMPRESSIONS      1. Left ventricular ejection fraction, by visual estimation, is 35%. The left ventricle has moderately decreased function. Normal left ventricular size. There is mildly increased left ventricular hypertrophy. Diffuse hypokinesis with septal-lateral  dyssynchrony.  2. Left ventricular diastolic Doppler parameters are indeterminate pattern of LV diastolic filling.  3. Global right ventricle has normal systolic function.The right ventricular size is normal. No increase in right ventricular wall thickness.  4. Bioprosthetic aortic valve. No significant regurgitation. Mean gradient 14 mmHg, no significant bioprosthetic aortic valve stenosis.  5. The mitral valve is s/p repair. Trivial regurgitation. Mean gradient 4 mmHg with MVA by PHT 2.97 cm^2, mild stenosis.  6. The tricuspid valve is normal in structure. Tricuspid valve regurgitation is mild.  7. Left atrial size was moderately dilated.  8. Right atrial size was mildly dilated.  9. The inferior vena cava is normal in size with greater than 50% respiratory variability,  suggesting right atrial pressure of 3 mmHg. 10. The tricuspid regurgitant velocity is 2.35 m/s, and with an assumed right atrial pressure of 3 mmHg, the estimated right ventricular systolic pressure is normal at 25.1 mmHg. 11. Trivial pericardial effusion is present. 12. The patient was in atrial fibrillation.       ASSESSMENT:    1. Chronic systolic CHF (congestive heart failure) (HCC)   2. Persistent atrial fibrillation ([place])   3. Coronary artery disease involving native coronary artery of native heart without angina pectoris   4. S/P AVR (aortic valve replacement)   5. History of CVA (cerebrovascular accident)   6. Essential hypertension      PLAN:  In order of problems listed above:  Chronic systolic CHF ejection fraction 30 to 35% on echo 12/2018.  Lasix increased to 40 mg twice daily and she has had a 10 pound weight loss and dramatic improvement in her breathing.  She was getting extra salt in her diet.  Have provided 2 g sodium diet.  Discussed possibly reducing Lasix again but she want to stay on this dose for now.  Check be met today.  She has follow-up with Dr. Harl Bowie in 3 days to further discuss cardioversion.  Persistent atrial fibrillation on Coumadin.  Had an episode yesterday with palpitations and a low disorientation similar to what happened in the hospital.  Dr. Rayann Heman recommended antiarrhythmic therapy and cardioversion but patient wanted to try cardioversion without any antiarrhythmics.  He thinks her A. fib may be contributing to her reduced ejection fraction, CVAs and heart failure.  She has moderate dilatation of her left atrium on echo.  She will discuss  further with Dr. Harl Bowie in 3 days.  CAD status post CABG x1 with SVG to the RCA and porcine aortic valve replacement and mitral valve repair in 2015  History of TIA CVA 08/2016 with an INR of 2.9 at that time, CVA 12/2018  Essential HTN  Medication Adjustments/Labs and Tests Ordered: Current medicines are  reviewed at length with the patient today.  Concerns regarding medicines are outlined above.  Medication changes, Labs and Tests ordered today are listed in the Patient Instructions below. Patient Instructions  Medication Instructions:  Your physician recommends that you continue on your current medications as directed. Please refer to the Current Medication list given to you today.  *If you need a refill on your cardiac medications before your next appointment, please call your pharmacy*  Lab Work: Your physician recommends that you return for lab work in: Today   If you have labs (blood work) drawn today and your tests are completely normal, you will receive your results only by: Marland Kitchen MyChart Message (if you have MyChart) OR . A paper copy in the mail If you have any lab test that is abnormal or we need to change your treatment, we will call you to review the results.  Testing/Procedures: NONE   Follow-Up: At [facility], you and your health needs are our priority.  As part of our continuing mission to provide you with exceptional heart care, we have created designated Provider Care Teams.  These Care Teams include your primary Cardiologist (physician) and Advanced Practice Providers (APPs -  Physician Assistants and Nurse Practitioners) who all work together to provide you with the care you need, when you need it.  Your next appointment:    Keep appointment with Dr. Harl Bowie   The format for your next appointment:   In Person  Provider:   Carlyle Dolly, MD  Other Instructions Thank you for choosing [facility]!   Low-Sodium Eating Plan Sodium, which is an element that makes up salt, helps you maintain a healthy balance of fluids in your body. Too much sodium can increase your blood pressure and cause fluid and waste to be held in your body. Your health care provider or dietitian may recommend following this plan if you have high blood pressure (hypertension), kidney  disease, liver disease, or heart failure. Eating less sodium can help lower your blood pressure, reduce swelling, and protect your heart, liver, and kidneys. What are tips for following this plan? General guidelines  Most people on this plan should limit their sodium intake to 1,500-2,000 mg (milligrams) of sodium each day. Reading food labels   The Nutrition Facts label lists the amount of sodium in one serving of the food. If you eat more than one serving, you must multiply the listed amount of sodium by the number of servings.  Choose foods with less than 140 mg of sodium per serving.  Avoid foods with 300 mg of sodium or more per serving. Shopping  Look for lower-sodium products, often labeled as "low-sodium" or "no salt added."  Always check the sodium content even if foods are labeled as "unsalted" or "no salt added".  Buy fresh foods. ? Avoid canned foods and premade or frozen meals. ? Avoid canned, cured, or processed meats  Buy breads that have less than 80 mg of sodium per slice. Cooking  Eat more home-cooked food and less restaurant, buffet, and fast food.  Avoid adding salt when cooking. Use salt-free seasonings or herbs instead of table salt or sea  salt. Check with your health care provider or pharmacist before using salt substitutes.  Cook with plant-based oils, such as canola, sunflower, or olive oil. Meal planning  When eating at a restaurant, ask that your food be prepared with less salt or no salt, if possible.  Avoid foods that contain MSG (monosodium glutamate). MSG is sometimes added to [place] food, bouillon, and some canned foods. What foods are recommended? The items listed may not be a complete list. Talk with your dietitian about what dietary choices are best for you. Grains Low-sodium cereals, including oats, puffed wheat and rice, and shredded wheat. Low-sodium crackers. Unsalted rice. Unsalted pasta. Low-sodium bread. Whole-grain breads and  whole-grain pasta. Vegetables Fresh or frozen vegetables. "No salt added" canned vegetables. "No salt added" tomato sauce and paste. Low-sodium or reduced-sodium tomato and vegetable juice. Fruits Fresh, frozen, or canned fruit. Fruit juice. Meats and other protein foods Fresh or frozen (no salt added) meat, poultry, seafood, and fish. Low-sodium canned tuna and salmon. Unsalted nuts. Dried peas, beans, and lentils without added salt. Unsalted canned beans. Eggs. Unsalted nut butters. Dairy Milk. Soy milk. Cheese that is naturally low in sodium, such as ricotta cheese, fresh mozzarella, or Swiss cheese Low-sodium or reduced-sodium cheese. Cream cheese. Yogurt. Fats and oils Unsalted butter. Unsalted margarine with no trans fat. Vegetable oils such as canola or olive oils. Seasonings and other foods Fresh and dried herbs and spices. Salt-free seasonings. Low-sodium mustard and ketchup. Sodium-free salad dressing. Sodium-free light mayonnaise. Fresh or refrigerated horseradish. Lemon juice. Vinegar. Homemade, reduced-sodium, or low-sodium soups. Unsalted popcorn and pretzels. Low-salt or salt-free chips. What foods are not recommended? The items listed may not be a complete list. Talk with your dietitian about what dietary choices are best for you. Grains Instant hot cereals. Bread stuffing, pancake, and biscuit mixes. Croutons. Seasoned rice or pasta mixes. Noodle soup cups. Boxed or frozen macaroni and cheese. Regular salted crackers. Self-rising flour. Vegetables Sauerkraut, pickled vegetables, and relishes. Olives. [place] fries. Onion rings. Regular canned vegetables (not low-sodium or reduced-sodium). Regular canned tomato sauce and paste (not low-sodium or reduced-sodium). Regular tomato and vegetable juice (not low-sodium or reduced-sodium). Frozen vegetables in sauces. Meats and other protein foods Meat or fish that is salted, canned, smoked, spiced, or pickled. Bacon, ham, sausage,  hotdogs, corned beef, chipped beef, packaged lunch meats, salt pork, jerky, pickled herring, anchovies, regular canned tuna, sardines, salted nuts. Dairy Processed cheese and cheese spreads. Cheese curds. Blue cheese. Feta cheese. String cheese. Regular cottage cheese. Buttermilk. Canned milk. Fats and oils Salted butter. Regular margarine. Ghee. Bacon fat. Seasonings and other foods Onion salt, garlic salt, seasoned salt, table salt, and sea salt. Canned and packaged gravies. Worcestershire sauce. Tartar sauce. Barbecue sauce. Teriyaki sauce. Soy sauce, including reduced-sodium. Steak sauce. Fish sauce. Oyster sauce. Cocktail sauce. Horseradish that you find on the shelf. Regular ketchup and mustard. Meat flavorings and tenderizers. Bouillon cubes. Hot sauce and Tabasco sauce. Premade or packaged marinades. Premade or packaged taco seasonings. Relishes. Regular salad dressings. Salsa. Potato and tortilla chips. Corn chips and puffs. Salted popcorn and pretzels. Canned or dried soups. Pizza. Frozen entrees and pot pies. Summary  Eating less sodium can help lower your blood pressure, reduce swelling, and protect your heart, liver, and kidneys.  Most people on this plan should limit their sodium intake to 1,500-2,000 mg (milligrams) of sodium each day.  Canned, boxed, and frozen foods are high in sodium. Restaurant foods, fast foods, and pizza are also very high in  sodium. You also get sodium by adding salt to food.  Try to cook at home, eat more fresh fruits and vegetables, and eat less fast food, canned, processed, or prepared foods. This information is not intended to replace advice given to you by your health care provider. Make sure you discuss any questions you have with your health care provider. Document Released: 09/01/2001 Document Revised: 02/22/2017 Document Reviewed: 03/05/2016 Elsevier Patient Education  2020 [place], Ermalinda Barrios, [place]  03/02/2019 2:18 PM     [facility] [place], [place], [place]  [address] Phone: [phone]; Fax: [phone]

## 2019-03-03 ENCOUNTER — Other Ambulatory Visit: Payer: Self-pay | Admitting: Internal Medicine

## 2019-03-03 DIAGNOSIS — H547 Unspecified visual loss: Secondary | ICD-10-CM

## 2019-03-03 DIAGNOSIS — E1140 Type 2 diabetes mellitus with diabetic neuropathy, unspecified: Secondary | ICD-10-CM

## 2019-03-03 DIAGNOSIS — H3530 Unspecified macular degeneration: Secondary | ICD-10-CM

## 2019-03-04 ENCOUNTER — Telehealth: Payer: Self-pay | Admitting: *Deleted

## 2019-03-04 ENCOUNTER — Other Ambulatory Visit: Payer: Self-pay

## 2019-03-04 ENCOUNTER — Ambulatory Visit (INDEPENDENT_AMBULATORY_CARE_PROVIDER_SITE_OTHER): Payer: Medicare Other | Admitting: *Deleted

## 2019-03-04 DIAGNOSIS — Z8673 Personal history of transient ischemic attack (TIA), and cerebral infarction without residual deficits: Secondary | ICD-10-CM | POA: Diagnosis not present

## 2019-03-04 DIAGNOSIS — Z5181 Encounter for therapeutic drug level monitoring: Secondary | ICD-10-CM

## 2019-03-04 DIAGNOSIS — I4891 Unspecified atrial fibrillation: Secondary | ICD-10-CM | POA: Diagnosis not present

## 2019-03-04 LAB — POCT INR: INR: 2.2 (ref 2.0–3.0)

## 2019-03-04 MED ORDER — GABAPENTIN 100 MG PO CAPS: ORAL_CAPSULE | ORAL | 1 refills | Status: DC

## 2019-03-04 MED ORDER — POTASSIUM CHLORIDE CRYS ER 20 MEQ PO TBCR: 20.0000 meq | EXTENDED_RELEASE_TABLET | Freq: Two times a day (BID) | ORAL | 1 refills | Status: DC

## 2019-03-04 NOTE — Telephone Encounter (Signed)
Called patient with test results. No answer. Left message to call back.  

## 2019-03-04 NOTE — Patient Instructions (Signed)
Took coumadin this morning.  Pending DCCV.  Not scheduled yet.  Has appt with Dr Harl Bowie 03/05/19 Continue warfarin 1 tablet daily except 1/2 tablet on Sundays Recheck in 1 week

## 2019-03-04 NOTE — Telephone Encounter (Signed)
-----  Message from Imogene Burn, [place] sent at 03/02/2019  4:03 PM EST ----- Kidney function starting to trend up a little.  Would try to reduce Lasix 40 mg in the morning 20 mg in the afternoon and repeat be met in 2 weeks.  Discuss with Dr. Harl Bowie when they talk to him later this week.

## 2019-03-05 ENCOUNTER — Telehealth (INDEPENDENT_AMBULATORY_CARE_PROVIDER_SITE_OTHER): Payer: Medicare Other | Admitting: Cardiology

## 2019-03-05 VITALS — BP 79/41 | HR 80 | Ht 62.0 in | Wt 159.0 lb

## 2019-03-05 DIAGNOSIS — I4891 Unspecified atrial fibrillation: Secondary | ICD-10-CM

## 2019-03-05 DIAGNOSIS — I9589 Other hypotension: Secondary | ICD-10-CM

## 2019-03-05 DIAGNOSIS — I5022 Chronic systolic (congestive) heart failure: Secondary | ICD-10-CM

## 2019-03-05 MED ORDER — FUROSEMIDE 20 MG PO TABS: ORAL_TABLET | ORAL | 3 refills | Status: DC

## 2019-03-05 NOTE — Progress Notes (Signed)
Virtual Visit via Video Note   This visit type was conducted due to national recommendations for restrictions regarding the COVID-19 Pandemic (e.g. social distancing) in an effort to limit this patient's exposure and mitigate transmission in our community.  Due to her co-morbid illnesses, this patient is at least at moderate risk for complications without adequate follow up.  This format is felt to be most appropriate for this patient at this time.  All issues noted in this document were discussed and addressed.  A limited physical exam was performed with this format.  Please refer to the patient's chart for her consent to telehealth for [facility].   Date:  [id number]   ID:  [name], [name] [id number], MRN [id number]  Patient Location: Home Provider Location: Office  PCP:  Gayland Curry, DO  Cardiologist:  Carlyle Dolly, MD  Electrophysiologist:  Thompson Grayer, MD   Evaluation Performed:  Follow-Up Visit  Chief Complaint:  Follow up  History of Present Illness:    Jeanette Yates is a 78 y.o. female seen today for follow up of the following medical problems.   1. Valvular heart disease - echo 10/2013 severe MR, moderate AI - TEE 11/2013 LVEF 25-30%, severe MR due to MV anular dilatation, moderate AI -11/2013 s/p 23 mm Hancock II porcine valve AVR, MV repair with Medtronic 3D ring. Postop complicated by pleural effusion, afib - echo 04/2015 mild MR, normally functioning AVR. LVEF 50-55% - 08/2016 echo LVEF 65%, normal AVR, no significant MR.  - no recent issuess     2. HTN - compliant with meds  - actually low bp's today 79/41. Weight 159 lbs, down from 165 lbs just a few days ago. Labs on 12/7 showed mild uptrend in Cr.  - mild dizziness. No fevers or chills, no N/V/D  3. Hyperlipidemia - compliant with statin  4. CAD - she reports prior MI - CABG 11/2013 SVG-RCA at time of valve surgery -no recent chest pain   5.History of chronic systolic  HF, now with normalized LVEF - echo 10/2013 LVEF 30-35%, severe MR - cath 11/2013 without signifaicant CAD - 04/2015 echo LVEF 50-55%  -during 12/2018 admissoin with CVA and COVID +, LVEF found to be decreased 30-35% - increased SOB/DOE - + LE edema. +orthopnea. +abdominal distension - home weight today was 166 lbs.    - home weights increased to 170 lbs, we increased lasix to 40mg  bid. Signicant weight loss, down to 159 lbs today> breathing much improved but having some low bps    6. PAF -12/2018 admit with CVA and COVID+, in that setting issues with afib with RVR - has been on coumadin - rate control may be difficult due to baseline low normal heart rates.    - INRs have been therapeutic over 1 month - seen by EP recs to pursue cardioversion, discussed antiarrhythmics but patient was not in favor   7. TIA/CVA -  TIA 09/05/16 while in [place]. INR was 2.9 at the time.  - followed by Dr [name]  - admitted with CVA 12/2018     The patient does not have symptoms concerning for COVID-19 infection (fever, chills, cough, or new shortness of breath).    Past Medical History:  Diagnosis Date  . Adjustment disorder 10/17/2005  . Atrial fibrillation ([place])   . Cataract    bilateral  . CHF (congestive heart failure) ([place])   . Contracture of knee joint 07/26/2009  . Coronary artery disease   .  Degeneration of lumbar or lumbosacral intervertebral disc 07/26/2009  . Diabetes mellitus without complication ([place])   . Esophageal spasm   . Fibromyalgia   . Hearing loss 12/18/2010  . Heart murmur   . Hypercholesteremia   . Hypertension   . 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 Leaky heart valve   . Lyme disease   . Macular degeneration of both eyes   . Migraine 10/17/2005  . Mixed incontinence 10/17/2005  . Osteoarthritis    multiple joints   . Overweight 09/19/2010  . Pneumonia   . PONV (postoperative nausea and vomiting)   .  Postartificial menopausal syndrome 10/10/2009  . Prinzmetal angina ([place]) 10/17/2005  . Psoriasis 06/12/2006  . Sciatica   . TIA (transient ischemic attack)   . Type II diabetes mellitus ([place])   . [name] 10/17/2005   Past Surgical History:  Procedure Laterality Date  . ABDOMINAL HYSTERECTOMY    . AORTIC VALVE REPLACEMENT  12/10/2013   porcine  . CATARACT EXTRACTION, BILATERAL    . CHOLECYSTECTOMY  1981  . COLONOSCOPY  04/18/2006   Dr. Nelva Nay: internal hemorrhoids  . COLONOSCOPY  10/2003   Dr. Alphonsa Gin: hemorrhoids  . CORONARY ARTERY BYPASS GRAFT  2015  . ESOPHAGOGASTRODUODENOSCOPY  10/16/2013   Dr. Marla Roe: prior Nissen fundoplication intact, hypertonic LES, dilated up to 65 [place] with moderate resistance, gastritis but no H. pylori., Reactive gastritis, no celiac disease.  . ESOPHAGOGASTRODUODENOSCOPY  05/29/2012   Dr. Doy Mince: Moderately severe esophagitis, acute gastritis reactive, no H pylori, no celiac. Esophageal biopsies consistent with GERD, no Barrett  . ESOPHAGOGASTRODUODENOSCOPY  03/21/2009   Dr. Doy Mince: Reflux esophagitis, gastritis without H. pylori, esophagus stretched 57 savory  . ESOPHAGOGASTRODUODENOSCOPY  02/20/2008   Dr. Doy Mince: Esophagus dilated to 68 French, reactive gastropathy with no H pylori. No Barrett's on esophageal biopsy  . ESOPHAGOGASTRODUODENOSCOPY  09/24/2006   Dr. Doy Mince: Tight wrap noted, reactive gastropathy, no Barrett's  . ESOPHAGOGASTRODUODENOSCOPY (EGD) WITH PROPOFOL N/A 01/09/2017   Procedure: ESOPHAGOGASTRODUODENOSCOPY (EGD) WITH PROPOFOL;  Surgeon: Daneil Dolin, MD;  [facility];  Service: Endoscopy;  Laterality: N/A;  2:15PM  . FRACTURE SURGERY Left    wrist  . JOINT REPLACEMENT    . MALONEY DILATION N/A 01/09/2017   Procedure: Venia Minks DILATION;  Surgeon: Daneil Dolin, MD;  Location: [facility];  Service: Endoscopy;  Laterality: N/A;  . MITRAL VALVE REPAIR  11/2013  . NISSEN FUNDOPLICATION  [id number]   . REPLACEMENT TOTAL KNEE BILATERAL  2010/2013     No outpatient medications have been marked as taking for the 03/05/19 encounter (Appointment) with Arnoldo Lenis, MD.     Allergies:   Penicillins, Latex, Morphine and related, Tape, and Iodine   Social History   Tobacco Use  . Smoking status: Former Smoker    Packs/day: 1.00    Years: 20.00    Pack years: 20.00    Types: Cigarettes    Quit date: 09/12/1966    Years since quitting: 52.5  . Smokeless tobacco: Never Used  Substance Use Topics  . Alcohol use: No  . Drug use: No     Family Hx: The patient's family history includes Alzheimer's disease in her father; Arthritis in her maternal grandfather; Breast cancer in her cousin and paternal grandmother; Diabetes in her father; Heart attack in her father; Heart disease in her father and mother; Heart failure in her mother; Hypertension in her father; Mitral valve prolapse in her mother; Osteoporosis in  her paternal grandmother; Stroke in her father, maternal grandmother, and mother. There is no history of Colon cancer.  ROS:   Please see the history of present illness.     All other systems reviewed and are negative.   Prior CV studies:   The following studies were reviewed today:  04/2015 echo Study Conclusions  - Left ventricle: The cavity size was normal. Wall thickness was  increased in a pattern of mild LVH. Systolic function was low  normal. The estimated ejection fraction was in the range of 50%  to 55%. Wall motion was normal; there were no regional wall  motion abnormalities. The study is not technically sufficient to  allow evaluation of LV diastolic function. - Aortic valve: Normally functioning bioprosthetic aortic valve  noted. There was no stenosis. There was no regurgitation. Peak  velocity (S): 282 cm/s. Mean gradient (S): 17 mm Hg. - Aorta: Mild ascending aortic dilatation. Maximal diameter 3.73  cm. - Mitral valve: S/p mitral valve  repair. There was mild  regurgitation. Valve area by pressure half-time: 1.39 cm^2. - Left atrium: The atrium was moderately to severely dilated. - Right ventricle: Systolic function was mildly reduced. - Tricuspid valve: There was mild regurgitation. - Pulmonary arteries: Systolic pressure was mildly increased. PA  peak pressure: 33 mm Hg (S).  04/2015 Carotid US IMPRESSION: 1. Moderate right carotid bifurcation atherosclerotic vascular disease. Visually degree of stenosis in the 50-69% range. Elevation of flow velocity ratios also present.  2. Mild left carotid bifurcation atherosclerotic vascular disease. Degree of stenosis less than 50%.  3. Vertebral arteries are patent with antegrade flow.  Labs/Other Tests and Data Reviewed:    EKG:  No ECG reviewed.  Recent Labs: 09/04/2018: ALT 14 12/24/2018: Hemoglobin 12.2; Platelets 375 02/24/2019: Magnesium 2.0 03/02/2019: BUN 22; Creatinine, Ser 1.01; Potassium 3.9; Sodium 139   Recent Lipid Panel Lab Results  Component Value Date/Time   CHOL 120 12/25/2018 05:04 AM   TRIG 120 12/25/2018 05:04 AM   HDL 39 (L) 12/25/2018 05:04 AM   CHOLHDL 3.1 12/25/2018 05:04 AM   LDLCALC 57 12/25/2018 05:04 AM   LDLCALC 74 09/04/2018 09:39 AM    Wt Readings from Last 3 Encounters:  03/02/19 165 lb 3.2 oz (74.9 kg)  01/30/19 168 lb (76.2 kg)  01/14/19 169 lb 9.6 oz (76.9 kg)     Objective:    Vital Signs:   Today's Vitals   03/05/19 0816  BP: (!) 79/41  Pulse: 80  Weight: 159 lb (72.1 kg)  Height: 5\' 2"  (1.575 m)   Body mass index is 29.08 kg/m. Normal affect. Normal speech pattern. Patient is sitting comfortable in on apparent distress. No audible or visual signs of distress, no signs of SOB or increased work of breahting.   ASSESSMENT & PLAN:     1. Chronic systolic HF - LVEF previously normalized s/p valve surgery and CABG - recurrent drop in LVEF in setting of COVID pneumonia - weight was up to 170 with significant  SOB, we increased lasix to 40mg  bid. Weight down to 159 lbs with much improved breahting but low bp's. Hold lasix until Saturday, resume 71m in AM and 20mg  in PM if bp's have improved Saturday. Hold norvasc for now given low bp's   2. PAF - . CHADS2Vasc score of 7, continue coumadin.She has also been on ASA due to prior recurrent TIAs -plan for cardioversion due to persistent afib per EP recs, she has not wanted to add antiarrhythmiics   F/u  3 weeks  COVID-19 Education: The signs and symptoms of COVID-19 were discussed with the patient and how to seek care for testing (follow up with PCP or arrange E-visit).  The importance of social distancing was discussed today.  Time:   Today, I have spent 25 minutes with the patient with telehealth technology discussing the above problems.     Medication Adjustments/Labs and Tests Ordered: Current medicines are reviewed at length with the patient today.  Concerns regarding medicines are outlined above.   Tests Ordered: No orders of the defined types were placed in this encounter.   Medication Changes: No orders of the defined types were placed in this encounter.   Follow Up:  Either In Person or Virtual in 3 week(s)  Signed, Carlyle Dolly, MD  03/05/2019 8:14 AM    [place]

## 2019-03-05 NOTE — Addendum Note (Signed)
Addended by: Levonne Hubert on: 03/05/2019 10:06 AM   Modules accepted: Orders

## 2019-03-05 NOTE — Addendum Note (Signed)
Addended by: Levonne Hubert on: 03/05/2019 10:11 AM   Modules accepted: Orders

## 2019-03-05 NOTE — H&P (View-Only) (Signed)
Virtual Visit via Video Note   This visit type was conducted due to national recommendations for restrictions regarding the COVID-19 Pandemic (e.g. social distancing) in an effort to limit this patient's exposure and mitigate transmission in our community.  Due to her co-morbid illnesses, this patient is at least at moderate risk for complications without adequate follow up.  This format is felt to be most appropriate for this patient at this time.  All issues noted in this document were discussed and addressed.  A limited physical exam was performed with this format.  Please refer to the patient's chart for her consent to telehealth for [facility].   Date:  [id number]   ID:  Jeanette Yates, Jeanette Yates [id number], MRN [id number]  Patient Location: Home Provider Location: Office  PCP:  Gayland Curry, DO  Cardiologist:  Carlyle Dolly, MD  Electrophysiologist:  Thompson Grayer, MD   Evaluation Performed:  Follow-Up Visit  Chief Complaint:  Follow up  History of Present Illness:    Jeanette Yates is a 78 y.o. female seen today for follow up of the following medical problems.   1. Valvular heart disease - echo 10/2013 severe MR, moderate AI - TEE 11/2013 LVEF 25-30%, severe MR due to MV anular dilatation, moderate AI -11/2013 s/p 23 mm Hancock II porcine valve AVR, MV repair with Medtronic 3D ring. Postop complicated by pleural effusion, afib - echo 04/2015 mild MR, normally functioning AVR. LVEF 50-55% - 08/2016 echo LVEF 65%, normal AVR, no significant MR.  - no recent issuess     2. HTN - compliant with meds  - actually low bp's today 79/41. Weight 159 lbs, down from 165 lbs just a few days ago. Labs on 12/7 showed mild uptrend in Cr.  - mild dizziness. No fevers or chills, no N/V/D  3. Hyperlipidemia - compliant with statin  4. CAD - she reports prior MI - CABG 11/2013 SVG-RCA at time of valve surgery -no recent chest pain   5.History of chronic systolic  HF, now with normalized LVEF - echo 10/2013 LVEF 30-35%, severe MR - cath 11/2013 without signifaicant CAD - 04/2015 echo LVEF 50-55%  -during 12/2018 admissoin with CVA and COVID +, LVEF found to be decreased 30-35% - increased SOB/DOE - + LE edema. +orthopnea. +abdominal distension - home weight today was 166 lbs.    - home weights increased to 170 lbs, we increased lasix to 40mg  bid. Signicant weight loss, down to 159 lbs today> breathing much improved but having some low bps    6. PAF -12/2018 admit with CVA and COVID+, in that setting issues with afib with RVR - has been on coumadin - rate control may be difficult due to baseline low normal heart rates.    - INRs have been therapeutic over 1 month - seen by EP recs to pursue cardioversion, discussed antiarrhythmics but patient was not in favor   7. TIA/CVA -  TIA 09/05/16 while in [place]. INR was 2.9 at the time.  - followed by Dr [name]  - admitted with CVA 12/2018     The patient does not have symptoms concerning for COVID-19 infection (fever, chills, cough, or new shortness of breath).    Past Medical History:  Diagnosis Date  . Adjustment disorder 10/17/2005  . Atrial fibrillation ([place])   . Cataract    bilateral  . CHF (congestive heart failure) ([place])   . Contracture of knee joint 07/26/2009  . Coronary artery disease   .  Degeneration of lumbar or lumbosacral intervertebral disc 07/26/2009  . Diabetes mellitus without complication ([place])   . Esophageal spasm   . Fibromyalgia   . Hearing loss 12/18/2010  . Heart murmur   . Hypercholesteremia   . Hypertension   . 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 Leaky heart valve   . Lyme disease   . Macular degeneration of both eyes   . Migraine 10/17/2005  . Mixed incontinence 10/17/2005  . Osteoarthritis    multiple joints   . Overweight 09/19/2010  . Pneumonia   . PONV (postoperative nausea and vomiting)   .  Postartificial menopausal syndrome 10/10/2009  . Prinzmetal angina ([place]) 10/17/2005  . Psoriasis 06/12/2006  . Sciatica   . TIA (transient ischemic attack)   . Type II diabetes mellitus ([place])   . [name] 10/17/2005   Past Surgical History:  Procedure Laterality Date  . ABDOMINAL HYSTERECTOMY    . AORTIC VALVE REPLACEMENT  12/10/2013   porcine  . CATARACT EXTRACTION, BILATERAL    . CHOLECYSTECTOMY  1981  . COLONOSCOPY  04/18/2006   Dr. Nelva Nay: internal hemorrhoids  . COLONOSCOPY  10/2003   Dr. Alphonsa Gin: hemorrhoids  . CORONARY ARTERY BYPASS GRAFT  2015  . ESOPHAGOGASTRODUODENOSCOPY  10/16/2013   Dr. Marla Roe: prior Nissen fundoplication intact, hypertonic LES, dilated up to 9 [place] with moderate resistance, gastritis but no H. pylori., Reactive gastritis, no celiac disease.  . ESOPHAGOGASTRODUODENOSCOPY  05/29/2012   Dr. Doy Mince: Moderately severe esophagitis, acute gastritis reactive, no H pylori, no celiac. Esophageal biopsies consistent with GERD, no Barrett  . ESOPHAGOGASTRODUODENOSCOPY  03/21/2009   Dr. Doy Mince: Reflux esophagitis, gastritis without H. pylori, esophagus stretched 57 savory  . ESOPHAGOGASTRODUODENOSCOPY  02/20/2008   Dr. Doy Mince: Esophagus dilated to 25 French, reactive gastropathy with no H pylori. No Barrett's on esophageal biopsy  . ESOPHAGOGASTRODUODENOSCOPY  09/24/2006   Dr. Doy Mince: Tight wrap noted, reactive gastropathy, no Barrett's  . ESOPHAGOGASTRODUODENOSCOPY (EGD) WITH PROPOFOL N/A 01/09/2017   Procedure: ESOPHAGOGASTRODUODENOSCOPY (EGD) WITH PROPOFOL;  Surgeon: Daneil Dolin, MD;  [facility];  Service: Endoscopy;  Laterality: N/A;  2:15PM  . FRACTURE SURGERY Left    wrist  . JOINT REPLACEMENT    . MALONEY DILATION N/A 01/09/2017   Procedure: Venia Minks DILATION;  Surgeon: Daneil Dolin, MD;  [facility];  Service: Endoscopy;  Laterality: N/A;  . MITRAL VALVE REPAIR  11/2013  . NISSEN FUNDOPLICATION  123XX123    . REPLACEMENT TOTAL KNEE BILATERAL  2010/2013     No outpatient medications have been marked as taking for the 03/05/19 encounter (Appointment) with Arnoldo Lenis, MD.     Allergies:   Penicillins, Latex, Morphine and related, Tape, and Iodine   Social History   Tobacco Use  . Smoking status: Former Smoker    Packs/day: 1.00    Years: 20.00    Pack years: 20.00    Types: Cigarettes    Quit date: 09/12/1966    Years since quitting: 52.5  . Smokeless tobacco: Never Used  Substance Use Topics  . Alcohol use: No  . Drug use: No     Family Hx: The patient's family history includes Alzheimer's disease in her father; Arthritis in her maternal grandfather; Breast cancer in her cousin and paternal grandmother; Diabetes in her father; Heart attack in her father; Heart disease in her father and mother; Heart failure in her mother; Hypertension in her father; Mitral valve prolapse in her mother; Osteoporosis  in her paternal grandmother; Stroke in her father, maternal grandmother, and mother. There is no history of Colon cancer.  ROS:   Please see the history of present illness.     All other systems reviewed and are negative.   Prior CV studies:   The following studies were reviewed today:  04/2015 echo Study Conclusions  - Left ventricle: The cavity size was normal. Wall thickness was  increased in a pattern of mild LVH. Systolic function was low  normal. The estimated ejection fraction was in the range of 50%  to 55%. Wall motion was normal; there were no regional wall  motion abnormalities. The study is not technically sufficient to  allow evaluation of LV diastolic function. - Aortic valve: Normally functioning bioprosthetic aortic valve  noted. There was no stenosis. There was no regurgitation. Peak  velocity (S): 282 cm/s. Mean gradient (S): 17 mm Hg. - Aorta: Mild ascending aortic dilatation. Maximal diameter 3.73  cm. - Mitral valve: S/p mitral valve  repair. There was mild  regurgitation. Valve area by pressure half-time: 1.39 cm^2. - Left atrium: The atrium was moderately to severely dilated. - Right ventricle: Systolic function was mildly reduced. - Tricuspid valve: There was mild regurgitation. - Pulmonary arteries: Systolic pressure was mildly increased. PA  peak pressure: 33 mm Hg (S).  04/2015 Carotid US IMPRESSION: 1. Moderate right carotid bifurcation atherosclerotic vascular disease. Visually degree of stenosis in the 50-69% range. Elevation of flow velocity ratios also present.  2. Mild left carotid bifurcation atherosclerotic vascular disease. Degree of stenosis less than 50%.  3. Vertebral arteries are patent with antegrade flow.  Labs/Other Tests and Data Reviewed:    EKG:  No ECG reviewed.  Recent Labs: 09/04/2018: ALT 14 12/24/2018: Hemoglobin 12.2; Platelets 375 02/24/2019: Magnesium 2.0 03/02/2019: BUN 22; Creatinine, Ser 1.01; Potassium 3.9; Sodium 139   Recent Lipid Panel Lab Results  Component Value Date/Time   CHOL 120 12/25/2018 05:04 AM   TRIG 120 12/25/2018 05:04 AM   HDL 39 (L) 12/25/2018 05:04 AM   CHOLHDL 3.1 12/25/2018 05:04 AM   LDLCALC 57 12/25/2018 05:04 AM   LDLCALC 74 09/04/2018 09:39 AM    Wt Readings from Last 3 Encounters:  03/02/19 165 lb 3.2 oz (74.9 kg)  01/30/19 168 lb (76.2 kg)  01/14/19 169 lb 9.6 oz (76.9 kg)     Objective:    Vital Signs:   Today's Vitals   03/05/19 0816  BP: (!) 79/41  Pulse: 80  Weight: 159 lb (72.1 kg)  Height: 5\' 2"  (1.575 m)   Body mass index is 29.08 kg/m. Normal affect. Normal speech pattern. Patient is sitting comfortable in on apparent distress. No audible or visual signs of distress, no signs of SOB or increased work of breahting.   ASSESSMENT & PLAN:     1. Chronic systolic HF - LVEF previously normalized s/p valve surgery and CABG - recurrent drop in LVEF in setting of COVID pneumonia - weight was up to 170 with significant  SOB, we increased lasix to 40mg  bid. Weight down to 159 lbs with much improved breahting but low bp's. Hold lasix until Saturday, resume 105m in AM and 20mg  in PM if bp's have improved Saturday. Hold norvasc for now given low bp's   2. PAF - . CHADS2Vasc score of 7, continue coumadin.She has also been on ASA due to prior recurrent TIAs -plan for cardioversion due to persistent afib per EP recs, she has not wanted to add antiarrhythmiics  F/u 3 weeks  COVID-19 Education: The signs and symptoms of COVID-19 were discussed with the patient and how to seek care for testing (follow up with PCP or arrange E-visit).  The importance of social distancing was discussed today.  Time:   Today, I have spent 25 minutes with the patient with telehealth technology discussing the above problems.     Medication Adjustments/Labs and Tests Ordered: Current medicines are reviewed at length with the patient today.  Concerns regarding medicines are outlined above.   Tests Ordered: No orders of the defined types were placed in this encounter.   Medication Changes: No orders of the defined types were placed in this encounter.   Follow Up:  Either In Person or Virtual in 3 week(s)  Signed, Carlyle Dolly, MD  03/05/2019 8:14 AM    [place]

## 2019-03-05 NOTE — Patient Instructions (Addendum)
Medication Instructions:  Your physician has recommended you make the following change in your medication:  Hold Lasix. If Systolic Blood Pressure is above 100 on Saturday may resume at 40 mg in the AM and 20 mg in the PM.  Hold Norvasc  Hold Coreg the morning of your Cardioversion only.   Call us on Monday and update [place] on your Blood Pressure and weights  *If you need a refill on your cardiac medications before your next appointment, please call your pharmacy*  Lab Work: Your physician recommends that you return for lab work in: 03/10/19   If you have labs (blood work) drawn today and your tests are completely normal, you will receive your results only by: Marland Kitchen MyChart Message (if you have MyChart) OR . A paper copy in the mail If you have any lab test that is abnormal or we need to change your treatment, we will call you to review the results.  Testing/Procedures: Your physician has recommended that you have a Cardioversion (DCCV). Electrical Cardioversion uses a jolt of electricity to your heart either through paddles or wired patches attached to your chest. This is a controlled, usually prescheduled, procedure. Defibrillation is done under light anesthesia in the hospital, and you usually go home the day of the procedure. This is done to get your heart back into a normal rhythm. You are not awake for the procedure. Please see the instruction sheet given to you today.   Follow-Up: At [facility], you and your health needs are our priority.  As part of our continuing mission to provide you with exceptional heart care, we have created designated Provider Care Teams.  These Care Teams include your primary Cardiologist (physician) and Advanced Practice Providers (APPs -  Physician Assistants and Nurse Practitioners) who all work together to provide you with the care you need, when you need it.  Your next appointment:   3 week(s)  The format for your next appointment:   In  Person  Provider:   You may see Carlyle Dolly, MD or one of the following Advanced Practice Providers on your designated Care Team:    Bernerd Pho, PA-C   Ermalinda Barrios, [place]    Other Instructions Thank you for choosing [facility]!

## 2019-03-06 ENCOUNTER — Telehealth: Payer: Self-pay | Admitting: Cardiology

## 2019-03-06 NOTE — Telephone Encounter (Signed)
Called 03/05/2019 and again on 03/06/2019 to give DCCV date, pre-op/ COVID testing date and time an  Left messages to please return call

## 2019-03-10 ENCOUNTER — Other Ambulatory Visit: Payer: Self-pay

## 2019-03-10 ENCOUNTER — Ambulatory Visit (INDEPENDENT_AMBULATORY_CARE_PROVIDER_SITE_OTHER): Payer: Medicare Other | Admitting: *Deleted

## 2019-03-10 ENCOUNTER — Other Ambulatory Visit: Payer: Self-pay | Admitting: Cardiology

## 2019-03-10 DIAGNOSIS — I4891 Unspecified atrial fibrillation: Secondary | ICD-10-CM

## 2019-03-10 DIAGNOSIS — Z5181 Encounter for therapeutic drug level monitoring: Secondary | ICD-10-CM

## 2019-03-10 DIAGNOSIS — Z8673 Personal history of transient ischemic attack (TIA), and cerebral infarction without residual deficits: Secondary | ICD-10-CM | POA: Diagnosis not present

## 2019-03-10 LAB — POCT INR: INR: 2.5 (ref 2.0–3.0)

## 2019-03-10 NOTE — Patient Instructions (Signed)
Took coumadin this morning.  Pending DCCV on 03/16/19.   Continue warfarin 1 tablet daily except 1/2 tablet on Sundays Recheck in 1 week after cardioversion

## 2019-03-10 NOTE — Telephone Encounter (Signed)
BP's are up and down, not seeing the severe lows that we had seen before which is good. Stay off norvasc. Continue lasix. Can take 40mg  in AM and 20mg  in PM, if prefers can take 20mg  tid as she had done some days according to her log. Can update [place] if recurrent low blood pressures with SBP <90.    Zandra Abts MD

## 2019-03-11 NOTE — Patient Instructions (Signed)
Your procedure is scheduled on: 03/16/2019  Report to [facility] at    8:30 AM.  Call this number if you have problems the morning of surgery: [phone]   Remember:   Do not Eat or Drink after midnight   :  Take these medicines the morning of surgery with A SIP OF WATER: Carvedilol and gabapentin if needed   Do not wear jewelry, make-up or nail polish.  Do not wear lotions, powders, or perfumes. You may wear deodorant.  Do not shave 48 hours prior to surgery. Men may shave face and neck.  Do not bring valuables to the hospital.  Contacts, dentures or bridgework may not be worn into surgery.  Leave suitcase in the car. After surgery it may be brought to your room.  For patients admitted to the hospital, checkout time is 11:00 AM the day of discharge.   Patients discharged the day of surgery will not be allowed to drive home.     Electrical Cardioversion  Electrical cardioversion is the delivery of a jolt of electricity to restore a normal rhythm to the heart. A rhythm that is too fast or is not regular keeps the heart from pumping well. In this procedure, sticky patches or metal paddles are placed on the chest to deliver electricity to the heart from a device. This procedure may be done in an emergency if:  There is low or no blood pressure as a result of the heart rhythm.  Normal rhythm must be restored as fast as possible to protect the brain and heart from further damage.  It may save a life. This procedure may also be done for irregular or fast heart rhythms that are not immediately life-threatening. Tell a health care provider about:  Any allergies you have.  All medicines you are taking, including vitamins, herbs, eye drops, creams, and over-the-counter medicines.  Any problems you or family members have had with anesthetic medicines.  Any blood disorders you have.  Any surgeries you have had.  Any medical conditions you have.  Whether you are pregnant or may  be pregnant. What are the risks? Generally, this is a safe procedure. However, problems may occur, including:  Allergic reactions to medicines.  A blood clot that breaks free and travels to other parts of your body.  The possible return of an abnormal heart rhythm within hours or days after the procedure.  Your heart stopping (cardiac arrest). This is rare. What happens before the procedure? Medicines  Your health care provider may have you start taking: ? Blood-thinning medicines (anticoagulants) so your blood does not clot as easily. ? Medicines may be given to help stabilize your heart rate and rhythm.  Ask your health care provider about changing or stopping your regular medicines. This is especially important if you are taking diabetes medicines or blood thinners. General instructions  Plan to have someone take you home from the hospital or clinic.  If you will be going home right after the procedure, plan to have someone with you for 24 hours.  Follow instructions from your health care provider about eating or drinking restrictions. What happens during the procedure?  To lower your risk of infection: ? Your health care team will wash or sanitize their hands. ? Your skin will be washed with soap.  An IV tube will be inserted into one of your veins.  You will be given a medicine to help you relax (sedative).  Sticky patches (electrodes) or metal paddles may be  placed on your chest.  An electrical shock will be delivered. The procedure may vary among health care providers and hospitals. What happens after the procedure?   Your blood pressure, heart rate, breathing rate, and blood oxygen level will be monitored until the medicines you were given have worn off.  Do not drive for 24 hours if you were given a sedative.  Your heart rhythm will be watched to make sure it does not change. This information is not intended to replace advice given to you by your health care  provider. Make sure you discuss any questions you have with your health care provider. Document Released: 03/02/2002 Document Revised: 02/22/2017 Document Reviewed: 09/16/2015 Elsevier Patient Education  [employer].  Electrical Cardioversion, Care After This sheet gives you information about how to care for yourself after your procedure. Your health care provider may also give you more specific instructions. If you have problems or questions, contact your health care provider. What can I expect after the procedure? After the procedure, it is common to have:  Some redness on the skin where the shocks were given. Follow these instructions at home:   Do not drive for 24 hours if you were given a medicine to help you relax (sedative).  Take over-the-counter and prescription medicines only as told by your health care provider.  Ask your health care provider how to check your pulse. Check it often.  Rest for 48 hours after the procedure or as told by your health care provider.  Avoid or limit your caffeine use as told by your health care provider. Contact a health care provider if:  You feel like your heart is beating too quickly or your pulse is not regular.  You have a serious muscle cramp that does not go away. Get help right away if:   You have discomfort in your chest.  You are dizzy or you feel faint.  You have trouble breathing or you are short of breath.  Your speech is slurred.  You have trouble moving an arm or leg on one side of your body.  Your fingers or toes turn cold or blue. This information is not intended to replace advice given to you by your health care provider. Make sure you discuss any questions you have with your health care provider. Document Released: 12/31/2012 Document Revised: 02/22/2017 Document Reviewed: 09/16/2015 Elsevier Patient Education  2020 [employer] After These instructions provide you with  information about caring for yourself after your procedure. Your health care provider may also give you more specific instructions. Your treatment has been planned according to current medical practices, but problems sometimes occur. Call your health care provider if you have any problems or questions after your procedure. What can I expect after the procedure? After your procedure, you may:  Feel sleepy for several hours.  Feel clumsy and have poor balance for several hours.  Feel forgetful about what happened after the procedure.  Have poor judgment for several hours.  Feel nauseous or vomit.  Have a sore throat if you had a breathing tube during the procedure. Follow these instructions at home: For at least 24 hours after the procedure:      Have a responsible adult stay with you. It is important to have someone help care for you until you are awake and alert.  Rest as needed.  Do not: ? Participate in activities in which you could fall or become injured. ? Drive. ? Use  heavy machinery. ? Drink alcohol. ? Take sleeping pills or medicines that cause drowsiness. ? Make important decisions or sign legal documents. ? Take care of children on your own. Eating and drinking  Follow the diet that is recommended by your health care provider.  If you vomit, drink water, juice, or soup when you can drink without vomiting.  Make sure you have little or no nausea before eating solid foods. General instructions  Take over-the-counter and prescription medicines only as told by your health care provider.  If you have sleep apnea, surgery and certain medicines can increase your risk for breathing problems. Follow instructions from your health care provider about wearing your sleep device: ? Anytime you are sleeping, including during daytime naps. ? While taking prescription pain medicines, sleeping medicines, or medicines that make you drowsy.  If you smoke, do not smoke without  supervision.  Keep all follow-up visits as told by your health care provider. This is important. Contact a health care provider if:  You keep feeling nauseous or you keep vomiting.  You feel light-headed.  You develop a rash.  You have a fever. Get help right away if:  You have trouble breathing. Summary  For several hours after your procedure, you may feel sleepy and have poor judgment.  Have a responsible adult stay with you for at least 24 hours or until you are awake and alert. This information is not intended to replace advice given to you by your health care provider. Make sure you discuss any questions you have with your health care provider. Document Released: 07/03/2015 Document Revised: 06/10/2017 Document Reviewed: 07/03/2015 Elsevier Patient Education  2020 [employer].

## 2019-03-13 ENCOUNTER — Other Ambulatory Visit (HOSPITAL_COMMUNITY)
Admission: RE | Admit: 2019-03-13 | Discharge: 2019-03-13 | Disposition: A | Discharge status: Home / Self Care | Payer: Medicare Other | Source: Ambulatory Visit | Attending: Internal Medicine | Admitting: Internal Medicine

## 2019-03-13 ENCOUNTER — Encounter (HOSPITAL_COMMUNITY)
Admission: RE | Admit: 2019-03-13 | Discharge: 2019-03-13 | Disposition: A | Discharge status: Home / Self Care | Payer: Medicare Other | Source: Ambulatory Visit | Attending: Internal Medicine | Admitting: Internal Medicine

## 2019-03-13 ENCOUNTER — Other Ambulatory Visit: Payer: Self-pay

## 2019-03-13 DIAGNOSIS — Z01812 Encounter for preprocedural laboratory examination: Secondary | ICD-10-CM | POA: Diagnosis not present

## 2019-03-13 DIAGNOSIS — Z20828 Contact with and (suspected) exposure to other viral communicable diseases: Secondary | ICD-10-CM | POA: Diagnosis not present

## 2019-03-13 LAB — BASIC METABOLIC PANEL
Anion gap: 11 (ref 5–15)
BUN: 21 mg/dL (ref 8–23)
CO2: 29 mmol/L (ref 22–32)
Calcium: 9.2 mg/dL (ref 8.9–10.3)
Chloride: 101 mmol/L (ref 98–111)
Creatinine, Ser: 0.97 mg/dL (ref 0.44–1.00)
GFR calc Af Amer: >60 mL/min (ref >60)
GFR calc non Af Amer: 57 mL/min — ABNORMAL LOW (ref >60)
Glucose, Bld: 157 mg/dL — ABNORMAL HIGH (ref 70–99)
Potassium: 5 mmol/L (ref 3.5–5.1)
Sodium: 141 mmol/L (ref 135–145)

## 2019-03-13 LAB — PROTIME-INR
INR: 2.6 — ABNORMAL HIGH (ref 0.8–1.2)
Prothrombin Time: 28 seconds — ABNORMAL HIGH (ref 11.4–15.2)

## 2019-03-13 LAB — SARS CORONAVIRUS 2 (TAT 6-24 HRS): SARS Coronavirus 2: NEGATIVE

## 2019-03-16 ENCOUNTER — Encounter (HOSPITAL_COMMUNITY)
Admission: RE | Disposition: A | Discharge status: Home / Self Care | Payer: Self-pay | Source: Home / Self Care | Attending: Internal Medicine

## 2019-03-16 ENCOUNTER — Ambulatory Visit (HOSPITAL_COMMUNITY): Payer: Medicare Other | Admitting: Anesthesiology

## 2019-03-16 ENCOUNTER — Other Ambulatory Visit: Payer: Self-pay

## 2019-03-16 ENCOUNTER — Ambulatory Visit (HOSPITAL_COMMUNITY)
Admission: RE | Admit: 2019-03-16 | Discharge: 2019-03-16 | Disposition: A | Discharge status: Home / Self Care | Payer: Medicare Other | Attending: Internal Medicine | Admitting: Internal Medicine

## 2019-03-16 DIAGNOSIS — Z888 Allergy status to other drugs, medicaments and biological substances status: Secondary | ICD-10-CM | POA: Diagnosis not present

## 2019-03-16 DIAGNOSIS — I252 Old myocardial infarction: Secondary | ICD-10-CM | POA: Insufficient documentation

## 2019-03-16 DIAGNOSIS — E785 Hyperlipidemia, unspecified: Secondary | ICD-10-CM | POA: Diagnosis not present

## 2019-03-16 DIAGNOSIS — Z880 Allergy status to penicillin: Secondary | ICD-10-CM | POA: Diagnosis not present

## 2019-03-16 DIAGNOSIS — I4891 Unspecified atrial fibrillation: Secondary | ICD-10-CM

## 2019-03-16 DIAGNOSIS — Z8673 Personal history of transient ischemic attack (TIA), and cerebral infarction without residual deficits: Secondary | ICD-10-CM | POA: Diagnosis not present

## 2019-03-16 DIAGNOSIS — E7800 Pure hypercholesterolemia, unspecified: Secondary | ICD-10-CM | POA: Insufficient documentation

## 2019-03-16 DIAGNOSIS — Z885 Allergy status to narcotic agent status: Secondary | ICD-10-CM | POA: Insufficient documentation

## 2019-03-16 DIAGNOSIS — Z8249 Family history of ischemic heart disease and other diseases of the circulatory system: Secondary | ICD-10-CM | POA: Insufficient documentation

## 2019-03-16 DIAGNOSIS — I480 Paroxysmal atrial fibrillation: Secondary | ICD-10-CM | POA: Diagnosis not present

## 2019-03-16 DIAGNOSIS — Z87891 Personal history of nicotine dependence: Secondary | ICD-10-CM | POA: Insufficient documentation

## 2019-03-16 DIAGNOSIS — Z951 Presence of aortocoronary bypass graft: Secondary | ICD-10-CM | POA: Insufficient documentation

## 2019-03-16 DIAGNOSIS — Z7901 Long term (current) use of anticoagulants: Secondary | ICD-10-CM | POA: Insufficient documentation

## 2019-03-16 DIAGNOSIS — Z952 Presence of prosthetic heart valve: Secondary | ICD-10-CM | POA: Insufficient documentation

## 2019-03-16 DIAGNOSIS — I2510 Atherosclerotic heart disease of native coronary artery without angina pectoris: Secondary | ICD-10-CM | POA: Insufficient documentation

## 2019-03-16 DIAGNOSIS — I10 Essential (primary) hypertension: Secondary | ICD-10-CM | POA: Insufficient documentation

## 2019-03-16 DIAGNOSIS — E1136 Type 2 diabetes mellitus with diabetic cataract: Secondary | ICD-10-CM | POA: Diagnosis not present

## 2019-03-16 HISTORY — PX: CARDIOVERSION: SHX1299

## 2019-03-16 LAB — GLUCOSE, CAPILLARY: Glucose-Capillary: 122 mg/dL — ABNORMAL HIGH (ref 70–99)

## 2019-03-16 LAB — PROTIME-INR
INR: 2.4 — ABNORMAL HIGH (ref 0.8–1.2)
Prothrombin Time: 25.7 seconds — ABNORMAL HIGH (ref 11.4–15.2)

## 2019-03-16 SURGERY — CARDIOVERSION
Anesthesia: Monitor Anesthesia Care

## 2019-03-16 MED ORDER — MIDAZOLAM HCL 2 MG/2ML IJ SOLN: 0.5000 mg | Freq: Once | INTRAMUSCULAR | Status: DC | PRN

## 2019-03-16 MED ORDER — LACTATED RINGERS IV SOLN: INTRAVENOUS | Status: DC | PRN

## 2019-03-16 MED ORDER — PROPOFOL 500 MG/50ML IV EMUL
INTRAVENOUS | Status: DC | PRN
  Administered 2019-03-16: 80 ug/kg/min via INTRAVENOUS

## 2019-03-16 MED ORDER — LACTATED RINGERS IV SOLN: INTRAVENOUS | Status: DC

## 2019-03-16 MED ORDER — PROPOFOL 10 MG/ML IV BOLUS
INTRAVENOUS | Status: AC
  Filled 2019-03-16: qty 40

## 2019-03-16 MED ORDER — PROMETHAZINE HCL 25 MG/ML IJ SOLN: 6.2500 mg | INTRAMUSCULAR | Status: DC | PRN

## 2019-03-16 NOTE — Discharge Instructions (Signed)

## 2019-03-16 NOTE — CV Procedure (Signed)
Cardioversion for atrial fibrillation  Inform consent signed.    Pt sedated by anesthesia with Propofol and lidocaine intravenously   With pads in AP position pt cardioverted to SR with 200 J synchronized biphasic energy.  Procedure was without complication   12 lead EKG pending      Dorris Carnes MD

## 2019-03-16 NOTE — Anesthesia Procedure Notes (Signed)
Procedure Name: MAC Date/Time: 03/16/2019 11:10 AM Performed by: Andree Elk Leafy Motsinger A, CRNA Pre-anesthesia Checklist: Patient identified, Emergency Drugs available, Suction available, Patient being monitored and Timeout performed Patient Re-evaluated:Patient Re-evaluated prior to induction Oxygen Delivery Method: Non-rebreather mask

## 2019-03-16 NOTE — Anesthesia Preprocedure Evaluation (Signed)
Anesthesia Evaluation  Patient identified by MRN, date of birth, ID band Patient awake    Reviewed: Allergy & Precautions, NPO status , Patient's Chart, lab work & pertinent test results  History of Anesthesia Complications (+) PONV and history of anesthetic complications  Airway Mallampati: II  TM Distance: >3 FB Neck ROM: Full    Dental no notable dental hx. (+) Teeth Intact   Pulmonary pneumonia, resolved, former smoker,    Pulmonary exam normal breath sounds clear to auscultation       Cardiovascular Exercise Tolerance: Good hypertension, Pt. on medications and Pt. on home beta blockers + angina with exertion + CAD and +CHF  Normal cardiovascular examI Rhythm:Regular Rate:Normal  S/p AVR 2015 Here for DCCV  Last EF ~35 Reports can walk 1/8th of a mile   Neuro/Psych  Headaches, PSYCHIATRIC DISORDERS TIA Neuromuscular disease CVA, No Residual Symptoms    GI/Hepatic negative GI ROS, Neg liver ROS,   Endo/Other  negative endocrine ROSdiabetes  Renal/GU negative Renal ROS  negative genitourinary   Musculoskeletal negative musculoskeletal ROS (+)   Abdominal   Peds negative pediatric ROS (+)  Hematology negative hematology ROS (+)   Anesthesia Other Findings   Reproductive/Obstetrics negative OB ROS                             Anesthesia Physical Anesthesia Plan  ASA: III  Anesthesia Plan: MAC   Post-op Pain Management:    Induction: Intravenous  PONV Risk Score and Plan: 3 and TIVA, Midazolam, Treatment may vary due to age or medical condition and Ondansetron  Airway Management Planned: Simple Face Mask and Nasal Cannula  Additional Equipment:   Intra-op Plan:   Post-operative Plan:   Informed Consent: I have reviewed the patients History and Physical, chart, labs and discussed the procedure including the risks, benefits and alternatives for the proposed anesthesia with  the patient or authorized representative who has indicated his/her understanding and acceptance.     Dental advisory given  Plan Discussed with: CRNA  Anesthesia Plan Comments: (Plan Full PPE use  Plan MAC with GETA as needed d/w pt -WTP with same after Q&A)        Anesthesia Quick Evaluation

## 2019-03-16 NOTE — Interval H&P Note (Signed)
History and Physical Interval Note:  [id number] AM  Jeanette Yates  has presented today for surgery, with the diagnosis of a-fib.  The various methods of treatment have been discussed with the patient and family. After consideration of risks, benefits and other options for treatment, the patient has consented to  Procedure(s): CARDIOVERSION (N/A) as a surgical intervention.  The patient's history has been reviewed, patient examined, no change in status, stable for surgery.  I have reviewed the patient's chart and labs.  Questions were answered to the patient's satisfaction.     Dorris Carnes

## 2019-03-16 NOTE — Anesthesia Postprocedure Evaluation (Signed)
Anesthesia Post Note  Patient: Jeanette Yates  Procedure(s) Performed: CARDIOVERSION (N/A )  Patient location during evaluation: PACU Anesthesia Type: MAC Level of consciousness: awake and alert, oriented and patient cooperative Pain management: pain level controlled Vital Signs Assessment: post-procedure vital signs reviewed and stable Respiratory status: spontaneous breathing Cardiovascular status: stable Postop Assessment: no apparent nausea or vomiting Anesthetic complications: no     Last Vitals:  Vitals:   03/16/19 0927  BP: 133/84  Pulse: 98  Resp: 18  Temp: 36.9 C  SpO2: 100%    Last Pain:  Vitals:   03/16/19 0927  TempSrc: Oral  PainSc: 0-No pain                 Rekha Hobbins A

## 2019-03-16 NOTE — Progress Notes (Signed)
Electrical Cardioversion Procedure Note Jeanette Yates [id number] 10-Jan-1941  Procedure: Electrical Cardioversion Indications:  Atrial Fibrillation  Procedure Details Consent: Risks of procedure as well as the alternatives and risks of each were explained to the (patient/caregiver).  Consent for procedure obtained. Time Out: Verified patient identification, verified procedure, site/side was marked, verified correct patient position, special equipment/implants available, medications/allergies/relevent history reviewed, required imaging and test results available.  Performed  Patient placed on cardiac monitor, pulse oximetry, supplemental oxygen as necessary.  Sedation given: propofol Pacer pads placed anterior and posterior chest.  Cardioverted 1 time(s).  Cardioverted at [place].  Evaluation Findings: Post procedure EKG shows: NSR with BBB Complications: None Patient did tolerate procedure well.   Charm Barges S 03/16/2019, 11:38 AM

## 2019-03-16 NOTE — Transfer of Care (Signed)
Immediate Anesthesia Transfer of Care Note  Patient: Jeanette Yates  Procedure(s) Performed: CARDIOVERSION (N/A )  Patient Location: PACU  Anesthesia Type:MAC  Level of Consciousness: awake, alert , oriented and patient cooperative  Airway & Oxygen Therapy: Patient Spontanous Breathing and Patient connected to face mask oxygen  Post-op Assessment: Report given to RN and Post -op Vital signs reviewed and stable  Post vital signs: Reviewed and stable  Last Vitals:  Vitals Value Taken Time  BP    Temp    Pulse 84 03/16/19 1109  Resp 19 03/16/19 1109  SpO2 99 % 03/16/19 1109  Vitals shown include unvalidated device data.  Last Pain:  Vitals:   03/16/19 0927  TempSrc: Oral  PainSc: 0-No pain      Patients Stated Pain Goal: 5 (14/43/15 4008)  Complications: No apparent anesthesia complications

## 2019-03-24 ENCOUNTER — Other Ambulatory Visit: Payer: Self-pay

## 2019-03-24 ENCOUNTER — Ambulatory Visit (INDEPENDENT_AMBULATORY_CARE_PROVIDER_SITE_OTHER): Payer: Medicare Other | Admitting: *Deleted

## 2019-03-24 DIAGNOSIS — Z5181 Encounter for therapeutic drug level monitoring: Secondary | ICD-10-CM

## 2019-03-24 DIAGNOSIS — I4891 Unspecified atrial fibrillation: Secondary | ICD-10-CM | POA: Diagnosis not present

## 2019-03-24 DIAGNOSIS — Z8673 Personal history of transient ischemic attack (TIA), and cerebral infarction without residual deficits: Secondary | ICD-10-CM

## 2019-03-24 LAB — POCT INR: INR: 2.1 (ref 2.0–3.0)

## 2019-03-24 NOTE — Patient Instructions (Addendum)
1st Post DCCV INR Took coumadin this morning.  Successful DCCV on 03/16/19.   Continue warfarin 1 tablet daily except 1/2 tablet on Sundays Recheck in 1 week

## 2019-03-26 ENCOUNTER — Ambulatory Visit: Payer: Medicare Other | Admitting: Student

## 2019-03-26 NOTE — Progress Notes (Deleted)
Cardiology Office Note    Date:  [id number]   ID:  Rediet, Zittel [id number], MRN [id number]  PCP:  Gayland Curry, DO  Cardiologist: Carlyle Dolly, MD    No chief complaint on file.   History of Present Illness:    Jeanette Yates is a 78 y.o. female with past medical history of valvular heart disease (s/p porcine valve AVR and MV repair with [employer] 3D ring in 11/2013), CAD (s/p CABG with SVG-RCA in 11/2013), chronic combined systolic and diastolic CHF (EF 99991111 in 2015, normalized by repeat echo in 04/2015, at 30-35% during admission in 12/2018 with CVA and COVID), HTN, HLD, paroxysmal atrial fibrillation, and prior TIA/CVA who presents to the office today for follow-up from her DCCV.   She most recently had a telehealth visit with Dr. Harl Bowie on 03/05/2019 and had been having issues with hypotension, therefore Amlodipine was discontinued and she was informed to hold Lasix for several days then resume at 40mg  in AM/20mg  in PM. Given therapeutic INR's, a DCCV was arranged. This was performed by Dr. Harrington Challenger on 03/16/2019 with conversion to NSR following a 200 J synchronized biphasic shock.    Past Medical History:  Diagnosis Date   Adjustment disorder 10/17/2005   Atrial fibrillation (HCC)    Cataract    bilateral   CHF (congestive heart failure) (HCC)    Contracture of knee joint 07/26/2009   Coronary artery disease    Degeneration of lumbar or lumbosacral intervertebral disc 07/26/2009   Diabetes mellitus without complication (HCC)    Esophageal spasm    Fibromyalgia    Hearing loss 12/18/2010   Heart murmur    Hypercholesteremia    Hypertension    Insomnia disorder related to known organic factor 10/10/2009   Leaky heart valve    Lyme disease    Macular degeneration of both eyes    Migraine 10/17/2005   Mixed incontinence 10/17/2005   Osteoarthritis    multiple joints    Overweight 09/19/2010   Pneumonia    PONV (postoperative nausea  and vomiting)    Postartificial menopausal syndrome 10/10/2009   Prinzmetal angina ([place]) 10/17/2005   Psoriasis 06/12/2006   Sciatica    TIA (transient ischemic attack)    Type II diabetes mellitus ([place])    Urolith 10/17/2005    Past Surgical History:  Procedure Laterality Date   ABDOMINAL HYSTERECTOMY     AORTIC VALVE REPLACEMENT  12/10/2013   porcine   CARDIOVERSION N/A 03/16/2019   Procedure: CARDIOVERSION;  Surgeon: Fay Records, MD;  Location: [facility];  Service: Cardiovascular;  Laterality: N/A;   CATARACT EXTRACTION, BILATERAL     CHOLECYSTECTOMY  1981   COLONOSCOPY  04/18/2006   Dr. Nelva Nay: internal hemorrhoids   COLONOSCOPY  10/2003   Dr. Alphonsa Gin: hemorrhoids   CORONARY ARTERY BYPASS GRAFT  2015   ESOPHAGOGASTRODUODENOSCOPY  10/16/2013   Dr. Marla Roe: prior Nissen fundoplication intact, hypertonic LES, dilated up to 85 [place] with moderate resistance, gastritis but no H. pylori., Reactive gastritis, no celiac disease.   ESOPHAGOGASTRODUODENOSCOPY  05/29/2012   Dr. Doy Mince: Moderately severe esophagitis, acute gastritis reactive, no H pylori, no celiac. Esophageal biopsies consistent with GERD, no Barrett   ESOPHAGOGASTRODUODENOSCOPY  03/21/2009   Dr. Doy Mince: Reflux esophagitis, gastritis without H. pylori, esophagus stretched 35 savory   ESOPHAGOGASTRODUODENOSCOPY  02/20/2008   Dr. Doy Mince: Esophagus dilated to 63 [place], reactive gastropathy with no H pylori. No Barrett's on esophageal biopsy   ESOPHAGOGASTRODUODENOSCOPY  09/24/2006  Dr. Doy Mince: Tight wrap noted, reactive gastropathy, no Barrett's   ESOPHAGOGASTRODUODENOSCOPY (EGD) WITH PROPOFOL N/A 01/09/2017   Procedure: ESOPHAGOGASTRODUODENOSCOPY (EGD) WITH PROPOFOL;  Surgeon: Daneil Dolin, MD;  Location: [facility];  Service: Endoscopy;  Laterality: N/A;  2:15PM   FRACTURE SURGERY Left    wrist   JOINT REPLACEMENT     MALONEY DILATION N/A 01/09/2017   Procedure:  MALONEY DILATION;  Surgeon: Daneil Dolin, MD;  Location: [facility];  Service: Endoscopy;  Laterality: N/A;   MITRAL VALVE REPAIR  [id number]   NISSEN FUNDOPLICATION  [id number]   REPLACEMENT TOTAL KNEE BILATERAL  2010/2013    Current Medications: Outpatient Medications Prior to Visit  Medication Sig Dispense Refill   acetaminophen (TYLENOL) 650 MG CR tablet Take 650 mg by mouth every 8 (eight) hours as needed for pain.      amLODipine (NORVASC) 5 MG tablet Take 1 tablet (5 mg total) by mouth at bedtime. 90 tablet 1   aspirin EC 81 MG tablet Take 81 mg by mouth every morning.      atorvastatin (LIPITOR) 80 MG tablet TAKE 1 TABLET (80 MG TOTAL) BY MOUTH DAILY. 90 tablet 1   calcium citrate-vitamin D (CITRACAL+D) 315-200 MG-UNIT tablet Take 1 tablet by mouth 2 (two) times daily.     carvedilol (COREG) 6.25 MG tablet Take 1 tablet (6.25 mg total) by mouth 2 (two) times daily. 60 tablet 0   cetirizine (ZYRTEC) 10 MG tablet Take 10 mg by mouth daily.      Continuous Blood Gluc Receiver (FREESTYLE LIBRE 14 DAY READER) DEVI 1 application by Does not apply route 3 (three) times daily. 1 Device 5   Continuous Blood Gluc Sensor (FREESTYLE LIBRE 14 DAY SENSOR) MISC USE TO CHECK GLUCOSE THREE TIMES DAILY 2 each 0   empagliflozin (JARDIANCE) 10 MG TABS tablet Take 10 mg by mouth daily before supper. (Patient taking differently: Take 10 mg by mouth daily. ) 90 tablet 1   fluticasone (FLONASE) 50 MCG/ACT nasal spray USE 2 SPRAYS IN EACH NOSTRIL EVERY DAY (Patient taking differently: Place 2 sprays into both nostrils daily. ) 48 g 0   furosemide (LASIX) 20 MG tablet Take 40 mg in the AM and 20 mg in the pm Daily (Patient taking differently: Take 20 mg by mouth 3 (three) times daily. ) 270 tablet 3   gabapentin (NEURONTIN) 100 MG capsule Take 2 tablet ion the morning and 3 tablets at bedtime (Patient taking differently: Take 200-300 mg by mouth See admin instructions. Take 200 mg by mouth in the  morning and 300 mg at bedtime) 360 capsule 1   lisinopril (ZESTRIL) 20 MG tablet Take 1 tablet (20 mg total) by mouth daily. 90 tablet 1   loperamide (IMODIUM A-D) 2 MG tablet Take 2 mg by mouth as needed for diarrhea or loose stools.     metFORMIN (GLUCOPHAGE) 1000 MG tablet Take 1 tablet (1,000 mg total) by mouth 2 (two) times daily with a meal. 180 tablet 1   potassium chloride (KLOR-CON) 10 MEQ tablet Take 10 mEq by mouth 2 (two) times daily.     potassium chloride SA (KLOR-CON) 20 MEQ tablet Take 1 tablet (20 mEq total) by mouth 2 (two) times daily. With lasix (Patient not taking: Reported on 03/11/2019) 90 tablet 1   Probiotic Product (FORTIFY DAILY PROBIOTIC PO) Take 1 capsule by mouth daily.      tobramycin (TOBREX) 0.3 % ophthalmic solution Place 1 drop into both eyes See admin  instructions. Place 1 drop in both eyes four times daily the day before, day of, and day after eye injections.     Vitamin D, Ergocalciferol, (DRISDOL) 1.25 MG (50000 UT) CAPS capsule Take 1 capsule (50,000 Units total) by mouth every 7 (seven) days. 12 capsule 1   warfarin (COUMADIN) 6 MG tablet TAKE 1 TABLET EVERY DAY (Patient taking differently: Take 3-6 mg by mouth See admin instructions. Take 6 mg by mouth daily in the morning except Sunday take 3 mg in the morning.) 90 tablet 3   No facility-administered medications prior to visit.     Allergies:   Penicillins, Latex, Morphine and related, Iodine, and Tape   Social History   Socioeconomic History   Marital status: Widowed    Spouse name: Not on file   Number of children: 3   Years of education: Not on file   Highest education level: Not on file  Occupational History   Not on file  Tobacco Use   Smoking status: Former Smoker    Packs/day: 1.00    Years: 20.00    Pack years: 20.00    Types: Cigarettes    Quit date: 09/12/1966    Years since quitting: 52.5   Smokeless tobacco: Never Used  Substance and Sexual Activity   Alcohol  use: No   Drug use: No   Sexual activity: Never  Other Topics Concern   Not on file  Social History Narrative   Not on file   Social Determinants of Health   Financial Resource Strain:    Difficulty of Paying Living Expenses: Not on file  Food Insecurity:    Worried About Charity fundraiser in the Last Year: Not on file   [employer] of Food in the Last Year: Not on file  Transportation Needs:    Lack of Transportation (Medical): Not on file   Lack of Transportation (Non-Medical): Not on file  Physical Activity:    Days of Exercise per Week: Not on file   Minutes of Exercise per Session: Not on file  Stress:    Feeling of Stress : Not on file  Social Connections:    Frequency of Communication with Friends and Family: Not on file   Frequency of Social Gatherings with Friends and Family: Not on file   Attends Religious Services: Not on file   Active Member of Clubs or Organizations: Not on file   Attends Archivist Meetings: Not on file   Marital Status: Not on file     Family History:  The patient's ***family history includes Alzheimer's disease in her father; Arthritis in her maternal grandfather; Breast cancer in her cousin and paternal grandmother; Diabetes in her father; Heart attack in her father; Heart disease in her father and mother; Heart failure in her mother; Hypertension in her father; Mitral valve prolapse in her mother; Osteoporosis in her paternal grandmother; Stroke in her father, maternal grandmother, and mother.   Review of Systems:   Please see the history of present illness.     General:  No chills, fever, night sweats or weight changes.  Cardiovascular:  No chest pain, dyspnea on exertion, edema, orthopnea, palpitations, paroxysmal nocturnal dyspnea. Dermatological: No rash, lesions/masses Respiratory: No cough, dyspnea Urologic: No hematuria, dysuria Abdominal:   No nausea, vomiting, diarrhea, bright red blood per rectum, melena,  or hematemesis Neurologic:  No visual changes, wkns, changes in mental status. All other systems reviewed and are otherwise negative except as noted above.  Physical Exam:    VS:  There were no vitals taken for this visit.   General: Well developed, well nourished,female appearing in no acute distress. Head: Normocephalic, atraumatic, sclera non-icteric, no xanthomas, nares are without discharge.  Neck: No carotid bruits. JVD not elevated.  Lungs: Respirations regular and unlabored, without wheezes or rales.  Heart: ***Regular rate and rhythm. No S3 or S4.  No murmur, no rubs, or gallops appreciated. Abdomen: Soft, non-tender, non-distended with normoactive bowel sounds. No hepatomegaly. No rebound/guarding. No obvious abdominal masses. Msk:  Strength and tone appear normal for age. No joint deformities or effusions. Extremities: No clubbing or cyanosis. No edema.  Distal pedal pulses are 2+ bilaterally. Neuro: Alert and oriented X 3. Moves all extremities spontaneously. No focal deficits noted. Psych:  Responds to questions appropriately with a normal affect. Skin: No rashes or lesions noted  Wt Readings from Last 3 Encounters:  03/16/19 162 lb (73.5 kg)  03/13/19 162 lb (73.5 kg)  03/05/19 159 lb (72.1 kg)        Studies/Labs Reviewed:   EKG:  EKG is*** ordered today.  The ekg ordered today demonstrates ***  Recent Labs: 09/04/2018: ALT 14 12/24/2018: Hemoglobin 12.2; Platelets 375 02/24/2019: Magnesium 2.0 03/13/2019: BUN 21; Creatinine, Ser 0.97; Potassium 5.0; Sodium 141   Lipid Panel    Component Value Date/Time   CHOL 120 12/25/2018 0504   TRIG 120 12/25/2018 0504   HDL 39 (L) 12/25/2018 0504   CHOLHDL 3.1 12/25/2018 0504   VLDL 24 12/25/2018 0504   LDLCALC 57 12/25/2018 0504   LDLCALC 74 09/04/2018 0939    Additional studies/ records that were reviewed today include:   Echocardiogram: 11/2018 IMPRESSIONS    1. Left ventricular ejection fraction, by  visual estimation, is 35%. The left ventricle has moderately decreased function. Normal left ventricular size. There is mildly increased left ventricular hypertrophy. Diffuse hypokinesis with septal-lateral  dyssynchrony.  2. Left ventricular diastolic Doppler parameters are indeterminate pattern of LV diastolic filling.  3. Global right ventricle has normal systolic function.The right ventricular size is normal. No increase in right ventricular wall thickness.  4. Bioprosthetic aortic valve. No significant regurgitation. Mean gradient 14 mmHg, no significant bioprosthetic aortic valve stenosis.  5. The mitral valve is s/p repair. Trivial regurgitation. Mean gradient 4 mmHg with MVA by PHT 2.97 cm^2, mild stenosis.  6. The tricuspid valve is normal in structure. Tricuspid valve regurgitation is mild.  7. Left atrial size was moderately dilated.  8. Right atrial size was mildly dilated.  9. The inferior vena cava is normal in size with greater than 50% respiratory variability, suggesting right atrial pressure of 3 mmHg. 10. The tricuspid regurgitant velocity is 2.35 m/s, and with an assumed right atrial pressure of 3 mmHg, the estimated right ventricular systolic pressure is normal at 25.1 mmHg. 11. Trivial pericardial effusion is present. 12. The patient was in atrial fibrillation.  Assessment:    No diagnosis found.   Plan:   In order of problems listed above:  1. ***    Medication Adjustments/Labs and Tests Ordered: Current medicines are reviewed at length with the patient today.  Concerns regarding medicines are outlined above.  Medication changes, Labs and Tests ordered today are listed in the Patient Instructions below. There are no Patient Instructions on file for this visit.   Signed, Erma Heritage, PA-C  03/26/2019 6:19 AM    [facility]. [address] [place], [place] [address] Phone: [phone] Fax: [phone]  [phone] ° ° °

## 2019-04-01 ENCOUNTER — Ambulatory Visit (INDEPENDENT_AMBULATORY_CARE_PROVIDER_SITE_OTHER): Payer: Medicare Other | Admitting: *Deleted

## 2019-04-01 DIAGNOSIS — I4891 Unspecified atrial fibrillation: Secondary | ICD-10-CM

## 2019-04-01 DIAGNOSIS — Z5181 Encounter for therapeutic drug level monitoring: Secondary | ICD-10-CM | POA: Diagnosis not present

## 2019-04-01 DIAGNOSIS — Z8673 Personal history of transient ischemic attack (TIA), and cerebral infarction without residual deficits: Secondary | ICD-10-CM | POA: Diagnosis not present

## 2019-04-01 LAB — POCT INR: INR: 2.3 (ref 2.0–3.0)

## 2019-04-01 NOTE — Patient Instructions (Signed)
2nd Post DCCV INR Took coumadin this morning.  Successful DCCV on 03/16/19.   Continue warfarin 1 tablet daily except 1/2 tablet on Sundays Recheck in 1 week

## 2019-04-06 ENCOUNTER — Other Ambulatory Visit: Payer: Medicare Other

## 2019-04-08 ENCOUNTER — Ambulatory Visit (INDEPENDENT_AMBULATORY_CARE_PROVIDER_SITE_OTHER): Payer: Medicare Other | Admitting: *Deleted

## 2019-04-08 ENCOUNTER — Other Ambulatory Visit: Payer: Self-pay

## 2019-04-08 DIAGNOSIS — I4891 Unspecified atrial fibrillation: Secondary | ICD-10-CM

## 2019-04-08 DIAGNOSIS — Z8673 Personal history of transient ischemic attack (TIA), and cerebral infarction without residual deficits: Secondary | ICD-10-CM | POA: Diagnosis not present

## 2019-04-08 DIAGNOSIS — Z5181 Encounter for therapeutic drug level monitoring: Secondary | ICD-10-CM

## 2019-04-08 LAB — POCT INR: INR: 2 (ref 2.0–3.0)

## 2019-04-08 NOTE — Patient Instructions (Signed)
3rd Post DCCV INR Took coumadin this morning.  Successful DCCV on 03/16/19.   Continue warfarin 1 tablet daily except 1/2 tablet on Sundays Recheck in 1 week  Pt feels like she is back in atrial fib.  By feel heart rhythm is irregular today.  Feels more SOB. Has appt with PCP on Friday.

## 2019-04-09 ENCOUNTER — Ambulatory Visit (INDEPENDENT_AMBULATORY_CARE_PROVIDER_SITE_OTHER): Payer: Medicare Other | Admitting: Internal Medicine

## 2019-04-09 ENCOUNTER — Encounter: Payer: Self-pay | Admitting: Internal Medicine

## 2019-04-09 ENCOUNTER — Other Ambulatory Visit: Payer: Medicare Other

## 2019-04-09 VITALS — BP 118/68 | HR 58 | Temp 96.2°F | Ht 65.0 in | Wt 163.0 lb

## 2019-04-09 DIAGNOSIS — I4891 Unspecified atrial fibrillation: Secondary | ICD-10-CM | POA: Diagnosis not present

## 2019-04-09 DIAGNOSIS — I10 Essential (primary) hypertension: Secondary | ICD-10-CM

## 2019-04-09 DIAGNOSIS — I5032 Chronic diastolic (congestive) heart failure: Secondary | ICD-10-CM | POA: Diagnosis not present

## 2019-04-09 DIAGNOSIS — Z1159 Encounter for screening for other viral diseases: Secondary | ICD-10-CM | POA: Diagnosis not present

## 2019-04-09 DIAGNOSIS — H3530 Unspecified macular degeneration: Secondary | ICD-10-CM | POA: Diagnosis not present

## 2019-04-09 DIAGNOSIS — H547 Unspecified visual loss: Secondary | ICD-10-CM

## 2019-04-09 DIAGNOSIS — E1140 Type 2 diabetes mellitus with diabetic neuropathy, unspecified: Secondary | ICD-10-CM

## 2019-04-09 MED ORDER — CARVEDILOL 6.25 MG PO TABS: 6.2500 mg | ORAL_TABLET | Freq: Two times a day (BID) | ORAL | 0 refills | Status: DC

## 2019-04-09 NOTE — Progress Notes (Signed)
Afib at rapid rate today 123bpm.

## 2019-04-09 NOTE — Patient Instructions (Addendum)
COVID-19 Vaccine Information can be found at: ShippingScam.co.uk For questions related to vaccine distribution or appointments, please email vaccine@[email or web address] .[email or web address] or call [phone].   Restart coreg 6.25mg  by mouth twice a day for your high pulse.   We will send Dr. Harl Bowie our notes about today.   You will need to follow-up with him.

## 2019-04-09 NOTE — Progress Notes (Signed)
Location:  [facility] clinic  Provider: Dr. Hollace Kinnier  Goals of Care:  Advanced Directives 04/09/2019  Does Patient Have a Medical Advance Directive? Yes  Type of Advance Directive [place]  Does patient want to make changes to medical advance directive? No - Patient declined  Copy of [place] in Chart? -  Would patient like information on creating a medical advance directive? -     Chief Complaint  Patient presents with  . Medication Management    4 month follow up for medication management, daughter [name] is with her. referral to weight loss clinic,  Heart has been out of rythem  sin 04/06/19     HPI: Patient is a 79 y.o. female seen today for medical management of chronic diseases.    Daughter present for visit.   2 days ago she noticed her heart was out of rhythm. She felt a flutter in her chest and was also experiencing shortness of breath. She had a cardioversion 03/16/2019 by Dr. Harl Bowie. Before her cardioversion, she claims her shortness of breath was worse. Today, she states her shortness of breath is minor and was able to walk a mile yesterday.   Takes her blood pressure medications daily. Trying to avoid foods high in salt, but sometimes will splurge. Denies any chest pain or headache.   Tries to walk around her house three times a day. Will also take walks in her neighborhood when the weather is nice.   Checking her sugar several times a day. Really likes freestyle glucometer. Will need a new prescription for the sensor- would like a three month supple at a time. Diet has improved with new freestyle glucometer. She will check her sugar more often and will refrain from eating foods high in sugar or carbs.  Takes her diabetic medication daily. States she feels better after starting the Jardiance in September. Denies any incidents of hypoglycemia.   Will eat 2 meals a day with 2 snacks in between meals. Will drink diet caffeine free coke  and water throughout the day. Sometimes has coffee. Still following a vegetarian diet, but will drink milk, eat cheese and eggs.   Claims her vision is getting worse. Having trouble seeing faces. Cannot write or read. Next appointment with Dr. Baird Cancer in next few months.   Ambulating with a cane due to her poor vision. Family is with her most times. Has not purchased a life alert and does not think she needs it at this time. No recent falls or injuries.   Daughter interested in referral to weight loss clinic.  Past Medical History:  Diagnosis Date  . Adjustment disorder 10/17/2005  . Atrial fibrillation ([place])   . Cataract    bilateral  . CHF (congestive heart failure) ([place])   . Contracture of knee joint 07/26/2009  . Coronary artery disease   . Degeneration of lumbar or lumbosacral intervertebral disc 07/26/2009  . Diabetes mellitus without complication ([place])   . Esophageal spasm   . Fibromyalgia   . Hearing loss 12/18/2010  . Heart murmur   . Hypercholesteremia   . Hypertension   . 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 Leaky heart valve   . Lyme disease   . Macular degeneration of both eyes   . Migraine 10/17/2005  . Mixed incontinence 10/17/2005  . Osteoarthritis    multiple joints   . Overweight 09/19/2010  . Pneumonia   . PONV (postoperative nausea and vomiting)   .  Postartificial menopausal syndrome 10/10/2009  . Prinzmetal angina ([place]) 10/17/2005  . Psoriasis 06/12/2006  . Sciatica   . TIA (transient ischemic attack)   . Type II diabetes mellitus ([place])   . [name] 10/17/2005    Past Surgical History:  Procedure Laterality Date  . ABDOMINAL HYSTERECTOMY    . AORTIC VALVE REPLACEMENT  12/10/2013   porcine  . CARDIOVERSION N/A 03/16/2019   Procedure: CARDIOVERSION;  Surgeon: Fay Records, MD;  Location: [facility];  Service: Cardiovascular;  Laterality: N/A;  . CATARACT EXTRACTION, BILATERAL    . CHOLECYSTECTOMY  1981  . COLONOSCOPY   04/18/2006   Dr. Nelva Nay: internal hemorrhoids  . COLONOSCOPY  10/2003   Dr. Alphonsa Gin: hemorrhoids  . CORONARY ARTERY BYPASS GRAFT  2015  . ESOPHAGOGASTRODUODENOSCOPY  10/16/2013   Dr. Marla Roe: prior Nissen fundoplication intact, hypertonic LES, dilated up to 102 [place] with moderate resistance, gastritis but no H. pylori., Reactive gastritis, no celiac disease.  . ESOPHAGOGASTRODUODENOSCOPY  05/29/2012   Dr. Doy Mince: Moderately severe esophagitis, acute gastritis reactive, no H pylori, no celiac. Esophageal biopsies consistent with GERD, no Barrett  . ESOPHAGOGASTRODUODENOSCOPY  03/21/2009   Dr. Doy Mince: Reflux esophagitis, gastritis without H. pylori, esophagus stretched 57 savory  . ESOPHAGOGASTRODUODENOSCOPY  02/20/2008   Dr. Doy Mince: Esophagus dilated to 70 [place], reactive gastropathy with no H pylori. No Barrett's on esophageal biopsy  . ESOPHAGOGASTRODUODENOSCOPY  09/24/2006   Dr. Doy Mince: Tight wrap noted, reactive gastropathy, no Barrett's  . ESOPHAGOGASTRODUODENOSCOPY (EGD) WITH PROPOFOL N/A 01/09/2017   Procedure: ESOPHAGOGASTRODUODENOSCOPY (EGD) WITH PROPOFOL;  Surgeon: Daneil Dolin, MD;  Location: [facility];  Service: Endoscopy;  Laterality: N/A;  2:15PM  . FRACTURE SURGERY Left    wrist  . JOINT REPLACEMENT    . MALONEY DILATION N/A 01/09/2017   Procedure: Venia Minks DILATION;  Surgeon: Daneil Dolin, MD;  Location: [facility];  Service: Endoscopy;  Laterality: N/A;  . MITRAL VALVE REPAIR  11/2013  . NISSEN FUNDOPLICATION  [id number]  . REPLACEMENT TOTAL KNEE BILATERAL  2010/2013    Allergies  Allergen Reactions  . Penicillins Anaphylaxis and Other (See Comments)    Has patient had a PCN reaction causing immediate rash, facial/tongue/throat swelling, SOB or lightheadedness with hypotension: Yes Has patient had a PCN reaction causing severe rash involving mucus membranes or skin necrosis: Yes Has patient had a PCN reaction that required hospitalization  Yes Has patient had a PCN reaction occurring within the last 10 years: No If all of the above answers are "NO", then may proceed with Cephalosporin use.   . Latex Rash    Redness  . Morphine And Related Nausea Only and Other (See Comments)    Dizziness  . Iodine Rash    Redness   . Tape Rash    Redness     Outpatient Encounter Medications as of 04/09/2019  Medication Sig  . acetaminophen (TYLENOL) 650 MG CR tablet Take 650 mg by mouth every 8 (eight) hours as needed for pain.   Marland Kitchen aspirin EC 81 MG tablet Take 81 mg by mouth every morning.   Marland Kitchen atorvastatin (LIPITOR) 80 MG tablet TAKE 1 TABLET (80 MG TOTAL) BY MOUTH DAILY.  . calcium citrate-vitamin D (CITRACAL+D) 315-200 MG-UNIT tablet Take 1 tablet by mouth 2 (two) times daily.  . cetirizine (ZYRTEC) 10 MG tablet Take 10 mg by mouth daily.   . Continuous Blood Gluc Receiver (FREESTYLE LIBRE 14 DAY READER) DEVI 1 application by Does not apply route 3 (three) times  daily.  . Continuous Blood Gluc Sensor (FREESTYLE LIBRE 14 DAY SENSOR) MISC USE TO CHECK GLUCOSE THREE TIMES DAILY  . empagliflozin (JARDIANCE) 10 MG TABS tablet Take 10 mg by mouth daily before supper.  . fluticasone (FLONASE) 50 MCG/ACT nasal spray USE 2 SPRAYS IN EACH NOSTRIL EVERY DAY  . furosemide (LASIX) 20 MG tablet Take 40 mg in the AM and 20 mg in the pm Daily  . gabapentin (NEURONTIN) 100 MG capsule Take 2 tablet ion the morning and 3 tablets at bedtime  . lisinopril (ZESTRIL) 20 MG tablet Take 1 tablet (20 mg total) by mouth daily.  Marland Kitchen loperamide (IMODIUM A-D) 2 MG tablet Take 2 mg by mouth as needed for diarrhea or loose stools.  . metFORMIN (GLUCOPHAGE) 1000 MG tablet Take 1 tablet (1,000 mg total) by mouth 2 (two) times daily with a meal.  . potassium chloride (KLOR-CON) 10 MEQ tablet Take 10 mEq by mouth 2 (two) times daily.  . Probiotic Product (FORTIFY DAILY PROBIOTIC PO) Take 1 capsule by mouth daily.   Marland Kitchen tobramycin (TOBREX) 0.3 % ophthalmic solution Place 1  drop into both eyes See admin instructions. Place 1 drop in both eyes four times daily the day before, day of, and day after eye injections.  . Vitamin D, Ergocalciferol, (DRISDOL) 1.25 MG (50000 UT) CAPS capsule Take 1 capsule (50,000 Units total) by mouth every 7 (seven) days.  Marland Kitchen warfarin (COUMADIN) 6 MG tablet TAKE 1 TABLET EVERY DAY  . [DISCONTINUED] amLODipine (NORVASC) 5 MG tablet Take 1 tablet (5 mg total) by mouth at bedtime.  . [DISCONTINUED] carvedilol (COREG) 6.25 MG tablet Take 1 tablet (6.25 mg total) by mouth 2 (two) times daily.  . [DISCONTINUED] potassium chloride SA (KLOR-CON) 20 MEQ tablet Take 1 tablet (20 mEq total) by mouth 2 (two) times daily. With lasix (Patient not taking: Reported on 03/11/2019)   No facility-administered encounter medications on file as of 04/09/2019.    Review of Systems:  Review of Systems  Constitutional: Negative for activity change, appetite change and fatigue.  HENT: Negative for dental problem, hearing loss and trouble swallowing.   Eyes: Positive for photophobia and visual disturbance.  Respiratory: Positive for shortness of breath. Negative for cough and wheezing.   Cardiovascular: Negative for chest pain and palpitations.  Gastrointestinal: Negative for abdominal pain, constipation, diarrhea and nausea.  Endocrine: Negative for polydipsia, polyphagia and polyuria.  Genitourinary: Negative for dysuria, frequency and hematuria.  Musculoskeletal: Positive for arthralgias and myalgias.  Skin: Negative.   Neurological: Negative for dizziness, weakness and headaches.  Psychiatric/Behavioral: Negative for dysphoric mood and sleep disturbance. The patient is not nervous/anxious.   All other systems reviewed and are negative.   Health Maintenance  Topic Date Due  . FOOT EXAM  09/06/2018  . OPHTHALMOLOGY EXAM  05/14/2019  . HEMOGLOBIN A1C  06/25/2019  . TETANUS/TDAP  10/13/2026  . INFLUENZA VACCINE  Completed  . DEXA SCAN  Completed  . PNA  vac Low Risk Adult  Completed    Physical Exam: Vitals:   04/09/19 1026  BP: 118/68  Pulse: (!) 58  Temp: (!) 96.2 F (35.7 C)  TempSrc: Temporal  SpO2: 97%  Weight: 163 lb (73.9 kg)  Height: 5\' 5"  (1.651 m)   Body mass index is 27.12 kg/m. Physical Exam Vitals reviewed.  Constitutional:      Appearance: Normal appearance. She is normal weight.  Cardiovascular:     Rate and Rhythm: Tachycardia present. Rhythm irregular.     Pulses:  Normal pulses.          Dorsalis pedis pulses are 2+ on the right side and 2+ on the left side.     Heart sounds: Normal heart sounds. No murmur.  Pulmonary:     Effort: Pulmonary effort is normal. No respiratory distress.     Breath sounds: Normal breath sounds. No wheezing.  Abdominal:     General: Bowel sounds are normal.     Palpations: Abdomen is soft. There is no mass.     Tenderness: There is no abdominal tenderness.  Musculoskeletal:     Right lower leg: No edema.     Left lower leg: No edema.  Skin:    General: Skin is warm and dry.     Capillary Refill: Capillary refill takes less than 2 seconds.  Neurological:     General: No focal deficit present.     Mental Status: She is alert. Mental status is at baseline. She is disoriented.  Psychiatric:        Mood and Affect: Mood normal.        Behavior: Behavior normal.        Thought Content: Thought content normal.        Judgment: Judgment normal.     Labs reviewed: Basic Metabolic Panel: Recent Labs    02/17/19 1155 02/17/19 1155 02/24/19 1005 03/02/19 1438 03/13/19 1004  NA 141   < > 142 139 141  K 3.3*   < > 4.2 3.9 5.0  CL 106   < > 107 102 101  CO2 25   < > 24 26 29   GLUCOSE 114*   < > 180* 164* 157*  BUN 20   < > 17 22 21   CREATININE 0.89   < > 0.97 1.01* 0.97  CALCIUM 9.4   < > 9.1 9.1 9.2  MG 1.9  --  2.0  --   --    < > = values in this interval not displayed.   Liver Function Tests: Recent Labs    04/24/18 1615 09/04/18 0939  AST 21 19  ALT 17 14   BILITOT 0.4 0.6  PROT 7.3 6.9   No results for input(s): LIPASE, AMYLASE in the last 8760 hours. No results for input(s): AMMONIA in the last 8760 hours. CBC: Recent Labs    04/24/18 1615 04/24/18 1615 09/04/18 0939 12/23/18 1558 12/24/18 0644  WBC 11.2*   < > 9.7 10.3 10.5  NEUTROABS 9,475*  --  6,936  --  7.2  HGB 13.4   < > 13.1 12.8 12.2  HCT 40.2   < > 40.0 41.2 37.1  MCV 84.6   < > 86.2 89.4 87.7  PLT 393   < > 412* 422* 375   < > = values in this interval not displayed.   Lipid Panel: Recent Labs    09/04/18 0939 12/25/18 0504  CHOL 142 120  HDL 40* 39*  LDLCALC 74 57  TRIG 183* 120  CHOLHDL 3.6 3.1   Lab Results  Component Value Date   HGBA1C 7.5 (H) 12/25/2018    Procedures since last visit: No results found.  Assessment/Plan 1. Essential hypertension - followed by Dr. Harl Bowie - bp at goal< 150/90 - cbc with differential/platelets- today - complete metabolic panel with GFR- today - continue to exercise a few times a week - continue to follow a low sodium diet  2. Atrial fibrillation with RVR (HCC) - EKG 12-lead - today - she was experiencing  palpitations and shortness of breath for past two days, EKG reads a-flutter/fib with HR 123, coreg and amlodipine has not been restarted since cardioversion 03/16/2019 - will restart coreg 6.25 mg twice a day for high heart rate - will contact Dr. Harl Bowie and provide visit note - continue to use cane when ambulating - be careful changing positions, and rise slowly  3. Type 2 diabetes mellitus with chronic painful diabetic neuropathy (HCC) - stable at this time - continue current medication regimen - continue to use freestyle glucometer and check sugars daily - continue to follow low sugar and carb diet - hemoglobin A1C- today - lipid panel- today  4. Macular degeneration of both eyes, unspecified type - followed by Dr. Baird Cancer - continue to use cane when ambulating - continue tobramycin eye drops  daily  5. Chronic diastolic congestive heart failure (HCC) - stable at this time, no signs of progression, exam unremarkable, no signs of fluid build-up - continue current lasix and potassium regimen     Labs/tests ordered:   Cbc with differential/platelets, complete metabolic panel with GFR, lipid panel, hemoglobin A1C, hep C antibody- today Next appt:  04/09/2019

## 2019-04-10 ENCOUNTER — Other Ambulatory Visit: Payer: Self-pay

## 2019-04-10 DIAGNOSIS — E1140 Type 2 diabetes mellitus with diabetic neuropathy, unspecified: Secondary | ICD-10-CM

## 2019-04-10 DIAGNOSIS — H3530 Unspecified macular degeneration: Secondary | ICD-10-CM

## 2019-04-10 DIAGNOSIS — H547 Unspecified visual loss: Secondary | ICD-10-CM

## 2019-04-10 LAB — COMPLETE METABOLIC PANEL WITH GFR
AG Ratio: 1.3 (calc) (ref 1.0–2.5)
ALT: 18 U/L (ref 6–29)
AST: 21 U/L (ref 10–35)
Albumin: 4 g/dL (ref 3.6–5.1)
Alkaline phosphatase (APISO): 85 U/L (ref 37–153)
BUN/Creatinine Ratio: 22 (calc) (ref 6–22)
BUN: 24 mg/dL (ref 7–25)
CO2: 27 mmol/L (ref 20–32)
Calcium: 9.9 mg/dL (ref 8.6–10.4)
Chloride: 101 mmol/L (ref 98–110)
Creat: 1.1 mg/dL — ABNORMAL HIGH (ref 0.60–0.93)
GFR, Est African American: 56 mL/min/{1.73_m2} — ABNORMAL LOW (ref >=60)
GFR, Est Non African American: 49 mL/min/{1.73_m2} — ABNORMAL LOW (ref >=60)
Globulin: 3.1 g/dL (calc) (ref 1.9–3.7)
Glucose, Bld: 135 mg/dL — ABNORMAL HIGH (ref 65–99)
Potassium: 4.2 mmol/L (ref 3.5–5.3)
Sodium: 141 mmol/L (ref 135–146)
Total Bilirubin: 0.4 mg/dL (ref 0.2–1.2)
Total Protein: 7.1 g/dL (ref 6.1–8.1)

## 2019-04-10 LAB — CBC WITH DIFFERENTIAL/PLATELET
Absolute Monocytes: 711 cells/uL (ref 200–950)
Basophils Absolute: 93 cells/uL (ref 0–200)
Basophils Relative: 0.9 %
Eosinophils Absolute: 391 cells/uL (ref 15–500)
Eosinophils Relative: 3.8 %
HCT: 40.9 % (ref 35.0–45.0)
Hemoglobin: 13.1 g/dL (ref 11.7–15.5)
Lymphs Abs: 2184 cells/uL (ref 850–3900)
MCH: 25.8 pg — ABNORMAL LOW (ref 27.0–33.0)
MCHC: 32 g/dL (ref 32.0–36.0)
MCV: 80.7 fL (ref 80.0–100.0)
MPV: 9.7 fL (ref 7.5–12.5)
Monocytes Relative: 6.9 %
Neutro Abs: 6922 cells/uL (ref 1500–7800)
Neutrophils Relative %: 67.2 %
Platelets: 544 10*3/uL — ABNORMAL HIGH (ref 140–400)
RBC: 5.07 10*6/uL (ref 3.80–5.10)
RDW: 14 % (ref 11.0–15.0)
Total Lymphocyte: 21.2 %
WBC: 10.3 10*3/uL (ref 3.8–10.8)

## 2019-04-10 LAB — HEPATITIS C ANTIBODY
Hepatitis C Ab: NONREACTIVE
SIGNAL TO CUT-OFF: 0.01 (ref <1.00)

## 2019-04-10 LAB — LIPID PANEL
Cholesterol: 138 mg/dL (ref <200)
HDL: 41 mg/dL — ABNORMAL LOW (ref >=50)
LDL Cholesterol (Calc): 70 mg/dL (calc)
Non-HDL Cholesterol (Calc): 97 mg/dL (calc) (ref <130)
Total CHOL/HDL Ratio: 3.4 (calc) (ref <5.0)
Triglycerides: 197 mg/dL — ABNORMAL HIGH (ref <150)

## 2019-04-10 LAB — HEMOGLOBIN A1C
Hgb A1c MFr Bld: 8 % of total Hgb — ABNORMAL HIGH (ref <5.7)
Mean Plasma Glucose: 183 (calc)
eAG (mmol/L): 10.1 (calc)

## 2019-04-10 MED ORDER — FREESTYLE LIBRE 14 DAY SENSOR MISC: 1 refills | Status: DC

## 2019-04-10 NOTE — Addendum Note (Signed)
Addended by: Gayland Curry on: 04/10/2019 04:58 PM   Modules accepted: Orders

## 2019-04-10 NOTE — Progress Notes (Signed)
Bad cholesterol is at goal, triglycerides are still elevated.  Sugar average is 8.0 which is increased from 7.5 last time.  I'm surprised b/c I had the impression that she was eating healthier and that frequent checks of her sugar were helping keep it down, plus she's walking.  I recommend we increase jardiance to 25mg  daily since she made the changes above and the average still went up.  If she's agreeable we can stop 10mg  and send in 25mg  to her mail order pharmacy. She needs to hydrate adequately with water to prevent her kidneys from worsening. Liver tests are normal and blood counts are normal. Hepatitis C screen is pending.

## 2019-04-14 ENCOUNTER — Encounter: Payer: Self-pay | Admitting: *Deleted

## 2019-04-15 ENCOUNTER — Other Ambulatory Visit: Payer: Self-pay

## 2019-04-15 ENCOUNTER — Ambulatory Visit (INDEPENDENT_AMBULATORY_CARE_PROVIDER_SITE_OTHER): Payer: Medicare Other | Admitting: *Deleted

## 2019-04-15 DIAGNOSIS — Z5181 Encounter for therapeutic drug level monitoring: Secondary | ICD-10-CM

## 2019-04-15 DIAGNOSIS — Z8673 Personal history of transient ischemic attack (TIA), and cerebral infarction without residual deficits: Secondary | ICD-10-CM | POA: Diagnosis not present

## 2019-04-15 DIAGNOSIS — I4891 Unspecified atrial fibrillation: Secondary | ICD-10-CM

## 2019-04-15 LAB — POCT INR: INR: 3.2 — AB (ref 2.0–3.0)

## 2019-04-15 NOTE — Patient Instructions (Signed)
4th Post DCCV INR Took coumadin this morning.  Successful DCCV on 03/16/19.   Take warfarin 1/2 tablet tomorrow then resume 1 tablet daily except 1/2 tablet on Sundays Recheck in 3 weeks

## 2019-04-20 ENCOUNTER — Telehealth: Payer: Self-pay | Admitting: Sports Medicine

## 2019-04-20 NOTE — Telephone Encounter (Signed)
pts daughter called checking status of diabetic shoes that were ordered.   Upon looking her doctor has not signed the diabetic shoe paperwork and that is what is holding [place] up.  She asked me to refax it and I have.

## 2019-04-22 ENCOUNTER — Encounter: Payer: Self-pay | Admitting: Cardiology

## 2019-04-22 ENCOUNTER — Telehealth (INDEPENDENT_AMBULATORY_CARE_PROVIDER_SITE_OTHER): Payer: Medicare Other | Admitting: Cardiology

## 2019-04-22 VITALS — BP 165/75 | HR 65 | Ht 62.0 in | Wt 161.0 lb

## 2019-04-22 DIAGNOSIS — I10 Essential (primary) hypertension: Secondary | ICD-10-CM

## 2019-04-22 DIAGNOSIS — I4891 Unspecified atrial fibrillation: Secondary | ICD-10-CM | POA: Diagnosis not present

## 2019-04-22 DIAGNOSIS — I5022 Chronic systolic (congestive) heart failure: Secondary | ICD-10-CM

## 2019-04-22 MED ORDER — AMLODIPINE BESYLATE 5 MG PO TABS: 5.0000 mg | ORAL_TABLET | Freq: Every day | ORAL | 1 refills | Status: DC

## 2019-04-22 NOTE — Addendum Note (Signed)
Addended by: Julian Hy T on: 04/22/2019 11:08 AM   Modules accepted: Orders

## 2019-04-22 NOTE — Progress Notes (Signed)
Virtual Visit via Telephone Note   This visit type was conducted due to national recommendations for restrictions regarding the COVID-19 Pandemic (e.g. social distancing) in an effort to limit this patient's exposure and mitigate transmission in our community.  Due to her co-morbid illnesses, this patient is at least at moderate risk for complications without adequate follow up.  This format is felt to be most appropriate for this patient at this time.  The patient did not have access to video technology/had technical difficulties with video requiring transitioning to audio format only (telephone).  All issues noted in this document were discussed and addressed.  No physical exam could be performed with this format.  Please refer to the patient's chart for her  consent to telehealth for [facility].   Date:  [id number]   ID:  Jeanette, Yates [id number], MRN [id number]  Patient Location: Home Provider Location: Office  PCP:  Gayland Curry, DO  Cardiologist:  Carlyle Dolly, MD  Electrophysiologist:  Thompson Grayer, MD   Evaluation Performed:  Follow-Up Visit  Chief Complaint:  Follow up visit  History of Present Illness:    Jeanette Yates is a 79 y.o. female seen today for follow up of the following medical problems.   1. Valvular heart disease - echo 10/2013 severe MR, moderate AI - TEE 11/2013 LVEF 25-30%, severe MR due to MV anular dilatation, moderate AI -11/2013 s/p 23 mm Hancock II porcine valve AVR, MV repair with Medtronic 3D ring. Postop complicated by pleural effusion, afib - echo 04/2015 mild MR, normally functioning AVR. LVEF 50-55% - 08/2016 echo LVEF 65%, normal AVR, no significant MR.      2. HTN -compliant with meds  - actually low bp's today 79/41. Weight 159 lbs, down from 165 lbs just a few days ago. Labs on 12/7 showed mild uptrend in Cr.  - mild dizziness. No fevers or chills, no N/V/D  - held norvasc due to low bp's last visit  - home  bps 180s  3. Hyperlipidemia - compliant with statin  4. CAD - she reports prior MI - CABG 11/2013 SVG-RCA at time of valve surgery -no recent chest pain   5.History of chronic systolic HF, now with normalized LVEF - echo 10/2013 LVEF 30-35%, severe MR - cath 11/2013 without signifaicant CAD - 04/2015 echo LVEF 50-55%  -during 12/2018 admissoin with CVA and COVID +, LVEF found to be decreased 30-35% -increased SOB/DOE - + LE edema. +orthopnea. +abdominal distension - home weight today was 166 lbs.   - home weights increased to 170 lbs, we increased lasix to 40mg  bid. Signicant weight loss, down to 159 lbs today> breathing much improved but having some low bps  - weight today 161 lbs, overall stable. Taking lasix 20mg  tid. SOB much improved. No recent edema.  - Jan 14 labs Cr 1.1 BUN 24     6. PAF -12/2018 admit with CVA and COVID+, in that setting issues with afib with RVR - has been on coumadin - rate control may be difficult due to baseline low normal heart rates.  - seen by EP recs to pursue cardioversion, discussed antiarrhythmics but patient was not in favor   03/16/19 succesful DCCV - pcp visit Jan 14 back in afib elevated rates. Coreg restarted - ongoing palpiatins.    7. TIA/CVA - TIA 09/05/16 while in [place]. INR was 2.9 at the time.  - followed by Dr [name]  - admitted with CVA 12/2018  The patient  does not have symptoms concerning for COVID-19 infection (fever, chills, cough, or new shortness of breath).    Past Medical History:  Diagnosis Date  . Adjustment disorder 10/17/2005  . Atrial fibrillation ([place])   . Cataract    bilateral  . CHF (congestive heart failure) ([place])   . Contracture of knee joint 07/26/2009  . Coronary artery disease   . Degeneration of lumbar or lumbosacral intervertebral disc 07/26/2009  . Diabetes mellitus without complication ([place])   . Esophageal spasm   . Fibromyalgia   . Hearing loss 12/18/2010    . Heart murmur   . Hypercholesteremia   . Hypertension   . 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 Leaky heart valve   . Lyme disease   . Macular degeneration of both eyes   . Migraine 10/17/2005  . Mixed incontinence 10/17/2005  . Osteoarthritis    multiple joints   . Overweight 09/19/2010  . Pneumonia   . PONV (postoperative nausea and vomiting)   . Postartificial menopausal syndrome 10/10/2009  . Prinzmetal angina ([place]) 10/17/2005  . Psoriasis 06/12/2006  . Sciatica   . TIA (transient ischemic attack)   . Type II diabetes mellitus ([place])   . [name] 10/17/2005   Past Surgical History:  Procedure Laterality Date  . ABDOMINAL HYSTERECTOMY    . AORTIC VALVE REPLACEMENT  12/10/2013   porcine  . CARDIOVERSION N/A 03/16/2019   Procedure: CARDIOVERSION;  Surgeon: Fay Records, MD;  Location: [facility] ORS;  Service: Cardiovascular;  Laterality: N/A;  . CATARACT EXTRACTION, BILATERAL    . CHOLECYSTECTOMY  1981  . COLONOSCOPY  04/18/2006   Dr. Nelva Nay: internal hemorrhoids  . COLONOSCOPY  10/2003   Dr. Alphonsa Gin: hemorrhoids  . CORONARY ARTERY BYPASS GRAFT  2015  . ESOPHAGOGASTRODUODENOSCOPY  10/16/2013   Dr. Marla Roe: prior Nissen fundoplication intact, hypertonic LES, dilated up to 29 [place] with moderate resistance, gastritis but no H. pylori., Reactive gastritis, no celiac disease.  . ESOPHAGOGASTRODUODENOSCOPY  05/29/2012   Dr. Doy Mince: Moderately severe esophagitis, acute gastritis reactive, no H pylori, no celiac. Esophageal biopsies consistent with GERD, no Barrett  . ESOPHAGOGASTRODUODENOSCOPY  03/21/2009   Dr. Doy Mince: Reflux esophagitis, gastritis without H. pylori, esophagus stretched 57 savory  . ESOPHAGOGASTRODUODENOSCOPY  02/20/2008   Dr. Doy Mince: Esophagus dilated to 1 French, reactive gastropathy with no H pylori. No Barrett's on esophageal biopsy  . ESOPHAGOGASTRODUODENOSCOPY  09/24/2006   Dr. Doy Mince: Tight wrap noted, reactive  gastropathy, no Barrett's  . ESOPHAGOGASTRODUODENOSCOPY (EGD) WITH PROPOFOL N/A 01/09/2017   Procedure: ESOPHAGOGASTRODUODENOSCOPY (EGD) WITH PROPOFOL;  Surgeon: Daneil Dolin, MD;  Location: [facility];  Service: Endoscopy;  Laterality: N/A;  2:15PM  . FRACTURE SURGERY Left    wrist  . JOINT REPLACEMENT    . MALONEY DILATION N/A 01/09/2017   Procedure: Venia Minks DILATION;  Surgeon: Daneil Dolin, MD;  Location: [facility];  Service: Endoscopy;  Laterality: N/A;  . MITRAL VALVE REPAIR  11/2013  . NISSEN FUNDOPLICATION  [id number]  . REPLACEMENT TOTAL KNEE BILATERAL  2010/2013     No outpatient medications have been marked as taking for the 04/22/19 encounter (Appointment) with Arnoldo Lenis, MD.     Allergies:   Penicillins, Latex, Morphine and related, Iodine, and Tape   Social History   Tobacco Use  . Smoking status: Former Smoker    Packs/day: 1.00    Years: 20.00    Pack years: 20.00    Types: Cigarettes  Quit date: 09/12/1966    Years since quitting: 52.6  . Smokeless tobacco: Never Used  Substance Use Topics  . Alcohol use: No  . Drug use: No     Family Hx: The patient's family history includes Alzheimer's disease in her father; Arthritis in her maternal grandfather; Breast cancer in her cousin and paternal grandmother; Diabetes in her father; Heart attack in her father; Heart disease in her father and mother; Heart failure in her mother; Hypertension in her father; Mitral valve prolapse in her mother; Osteoporosis in her paternal grandmother; Stroke in her father, maternal grandmother, and mother. There is no history of Colon cancer.  ROS:   Please see the history of present illness.     All other systems reviewed and are negative.   Prior CV studies:   The following studies were reviewed today:  04/2015 echo Study Conclusions  - Left ventricle: The cavity size was normal. Wall thickness was  increased in a pattern of mild LVH. Systolic function was  low  normal. The estimated ejection fraction was in the range of 50%  to 55%. Wall motion was normal; there were no regional wall  motion abnormalities. The study is not technically sufficient to  allow evaluation of LV diastolic function. - Aortic valve: Normally functioning bioprosthetic aortic valve  noted. There was no stenosis. There was no regurgitation. Peak  velocity (S): 282 cm/s. Mean gradient (S): 17 mm Hg. - Aorta: Mild ascending aortic dilatation. Maximal diameter 3.73  cm. - Mitral valve: S/p mitral valve repair. There was mild  regurgitation. Valve area by pressure half-time: 1.39 cm^2. - Left atrium: The atrium was moderately to severely dilated. - Right ventricle: Systolic function was mildly reduced. - Tricuspid valve: There was mild regurgitation. - Pulmonary arteries: Systolic pressure was mildly increased. PA  peak pressure: 33 mm Hg (S).  04/2015 Carotid US IMPRESSION: 1. Moderate right carotid bifurcation atherosclerotic vascular disease. Visually degree of stenosis in the 50-69% range. Elevation of flow velocity ratios also present.  2. Mild left carotid bifurcation atherosclerotic vascular disease. Degree of stenosis less than 50%.  3. Vertebral arteries are patent with antegrade flow.  Labs/Other Tests and Data Reviewed:    EKG:  No ECG reviewed.  Recent Labs: 02/24/2019: Magnesium 2.0 04/09/2019: ALT 18; BUN 24; Creat 1.10; Hemoglobin 13.1; Platelets 544; Potassium 4.2; Sodium 141   Recent Lipid Panel Lab Results  Component Value Date/Time   CHOL 138 04/09/2019 10:10 AM   TRIG 197 (H) 04/09/2019 10:10 AM   HDL 41 (L) 04/09/2019 10:10 AM   CHOLHDL 3.4 04/09/2019 10:10 AM   LDLCALC 70 04/09/2019 10:10 AM    Wt Readings from Last 3 Encounters:  04/09/19 163 lb (73.9 kg)  03/16/19 162 lb (73.5 kg)  03/13/19 162 lb (73.5 kg)     Objective:    Vital Signs:   Today's Vitals   04/22/19 1003  BP: (!) 165/75  Pulse: 65  Weight:  161 lb (73 kg)  Height: 5\' 2"  (1.575 m)   Body mass index is 29.45 kg/m. Normal affect. Normal speech pattern and tone. Comfortable, no apparent distress. No audible signs of SOB or wheezing.   ASSESSMENT & PLAN:    1. Chronic systolic HF - LVEFpreviouslynormalized s/p valve surgery and CABG - recurrent drop in LVEF in setting of COVID pneumonia - doing well on lasix 20mg  tid, weight of 161-163 appears to be a good range for her. Previously down to 159 lbs and was hypotensive - continue  current meds   2. PAF - . CHADS2Vasc score of 7, continue coumadin.She has also been on ASA due to prior recurrent TIAs - recent DCCV, from f/u EKG back in afib that is symptomatic - touch base with EP, from last visit there were considerations for tikasyn vs amio. Patient was reluctant at that time but now is open to starting  3. HTN - bp's were lower last visit when she was overdiuresed,norvasc was held - now that euovlemic bp's are elevatd, restart her norvasc 5mg  dail    COVID-19 Education: The signs and symptoms of COVID-19 were discussed with the patient and how to seek care for testing (follow up with PCP or arrange E-visit).  The importance of social distancing was discussed today.  Time:   Today, I have spent 20 minutes with the patient with telehealth technology discussing the above problems.     Medication Adjustments/Labs and Tests Ordered: Current medicines are reviewed at length with the patient today.  Concerns regarding medicines are outlined above.   Tests Ordered: No orders of the defined types were placed in this encounter.   Medication Changes: No orders of the defined types were placed in this encounter.   Follow Up:  Either In Person or Virtual in 3 month(s)  Signed, Carlyle Dolly, MD  04/22/2019 9:21 AM    [place]

## 2019-04-22 NOTE — Patient Instructions (Signed)
Your physician recommends that you schedule a follow-up appointment in: [place] has recommended you make the following change in your medication:   START AMLODIPINE 5 MG DAILY  Thank you for choosing [facility]!!

## 2019-04-24 ENCOUNTER — Telehealth: Payer: Self-pay | Admitting: *Deleted

## 2019-04-24 DIAGNOSIS — I4819 Other persistent atrial fibrillation: Secondary | ICD-10-CM

## 2019-04-24 NOTE — Telephone Encounter (Signed)
LM to return call.

## 2019-04-24 NOTE — Telephone Encounter (Signed)
-----   Message from Arnoldo Lenis, MD sent at 04/24/2019 11:05 AM EST ----- Please let patient know I heard back from Dr Rayann Heman. Due to COVID the hospital has put a freeze on elective admissions including admissions to start tikasyn. We both would recommend starting the amiodarone which can be done as an outpatient, with plans to transition off this medication in the near future. Typically short courses are well tolerated, long courses are when the risk for potential side effects increase. If she agrees can we start amio 400mg  bid x 1 week, then 200mg  bid x 2 weeks, then 200mg  daily. Needs a nursing visit for EKG and vitals in 3 weeks. Would need a CMET and TSH at that time as well if starts amiodarone.    Zandra Abts MD ----- Message ----- From: Thompson Grayer, MD Sent: 04/23/2019   9:22 PM EST To: Arnoldo Lenis, MD  Its pretty tough to do tikosyn now with the bed situation.  What if you start amiodarone and try to get her back into sinus.  We could then think about ablation to get her off of amiodarone down the road...   ----- Message ----- From: Arnoldo Lenis, MD Sent: 04/22/2019  11:02 AM EST To: Thompson Grayer, MD  Dorina Hoyer patient of ours. From your last visit you were considering amio vs tiksayn, she was reluctant and wanted DCCV without antiarrhythmic. Had DCCV but now back in afib, she is open to antiarrhythmic therapy. What would you recommend as next step. From a recent patient I had with Marya Amsler he had mentioned the hospital had put a freeze on tikasyn admits, Im not sure of the details or if still ongoing.    [employer]

## 2019-04-27 MED ORDER — AMIODARONE HCL 400 MG PO TABS: ORAL_TABLET | ORAL | 1 refills | Status: DC

## 2019-04-27 NOTE — Telephone Encounter (Signed)
Jeanette Yates(DPR) voiced understanding - will have labs done at [facility] in 3 weeks and scheduled nurse appt for that time. Amio sent to [facility]

## 2019-05-01 ENCOUNTER — Telehealth: Payer: Self-pay

## 2019-05-01 DIAGNOSIS — H353213 Exudative age-related macular degeneration, right eye, with inactive scar: Secondary | ICD-10-CM | POA: Diagnosis not present

## 2019-05-01 DIAGNOSIS — H26491 Other secondary cataract, right eye: Secondary | ICD-10-CM | POA: Diagnosis not present

## 2019-05-01 DIAGNOSIS — E119 Type 2 diabetes mellitus without complications: Secondary | ICD-10-CM | POA: Diagnosis not present

## 2019-05-01 DIAGNOSIS — H353222 Exudative age-related macular degeneration, left eye, with inactive choroidal neovascularization: Secondary | ICD-10-CM | POA: Diagnosis not present

## 2019-05-01 NOTE — Telephone Encounter (Signed)
Daughter called [phone] about medication Mother was to start  Thanks renee

## 2019-05-02 ENCOUNTER — Encounter: Payer: Self-pay | Admitting: Internal Medicine

## 2019-05-02 DIAGNOSIS — Z780 Asymptomatic menopausal state: Secondary | ICD-10-CM

## 2019-05-02 DIAGNOSIS — E2839 Other primary ovarian failure: Secondary | ICD-10-CM

## 2019-05-02 DIAGNOSIS — M8580 Other specified disorders of bone density and structure, unspecified site: Secondary | ICD-10-CM

## 2019-05-02 DIAGNOSIS — E559 Vitamin D deficiency, unspecified: Secondary | ICD-10-CM

## 2019-05-04 ENCOUNTER — Telehealth: Payer: Self-pay | Admitting: Cardiology

## 2019-05-04 NOTE — Telephone Encounter (Signed)
Daughter left detailed message on MyChart. I forwarded that to Dr.Branch

## 2019-05-04 NOTE — Telephone Encounter (Signed)
Asking about if ECHO can ne scheduled. Stated they were waiting to hear back if an order had been placed for it to be scheduled

## 2019-05-04 NOTE — Telephone Encounter (Signed)
I will forward to dr.branch.I do not see order for echo, will clarify

## 2019-05-05 ENCOUNTER — Encounter: Payer: Self-pay | Admitting: Sports Medicine

## 2019-05-05 ENCOUNTER — Other Ambulatory Visit: Payer: Self-pay

## 2019-05-05 ENCOUNTER — Ambulatory Visit (INDEPENDENT_AMBULATORY_CARE_PROVIDER_SITE_OTHER): Payer: Medicare Other | Admitting: Sports Medicine

## 2019-05-05 VITALS — Temp 97.3°F

## 2019-05-05 DIAGNOSIS — M79675 Pain in left toe(s): Secondary | ICD-10-CM

## 2019-05-05 DIAGNOSIS — M79674 Pain in right toe(s): Secondary | ICD-10-CM | POA: Diagnosis not present

## 2019-05-05 DIAGNOSIS — B351 Tinea unguium: Secondary | ICD-10-CM | POA: Diagnosis not present

## 2019-05-05 DIAGNOSIS — E1142 Type 2 diabetes mellitus with diabetic polyneuropathy: Secondary | ICD-10-CM

## 2019-05-05 NOTE — Telephone Encounter (Signed)
Would want to have the afib under control prior to repeating the echo. When in afib with high rates the echo is difficult to get good pictures.  We will see how things look at her upcoming nursing visit.   Zandra Abts MD

## 2019-05-05 NOTE — Progress Notes (Signed)
Subjective: Jeanette Yates is a 79 y.o. female patient with history of diabetes who returns to office today complaining of long,mildly painful nails while ambulating in shoes; unable to trim. Patient states that the glucose reading this morning was 98 mg/dl. Patient reports her last A1c was 8.  Patient denies any new problems or pedal complaints.  Continues to take Coumadin.  Patient is assisted by daughter this visit.  Patient Active Problem List   Diagnosis Date Noted  . COVID-19 virus infection 12/24/2018  . Acute ischemic stroke ([place]) 12/24/2018  . Stroke ([place]) 12/24/2018  . Grief reaction with prolonged bereavement 04/24/2018  . Senile osteopenia 04/24/2018  . Injury of right wrist 04/24/2018  . Plantar fasciitis, bilateral 04/24/2018  . Irritable bowel syndrome with both constipation and diarrhea 04/24/2018  . Lactose intolerance 04/24/2018  . Influenza 04/24/2018  . Body mass index (BMI) of 30.0-30.9 in adult 09/05/2017  . Class 1 obesity due to excess calories with serious comorbidity and body mass index (BMI) of 30.0 to 30.9 in adult 09/05/2017  . Seasonal allergies 09/05/2017  . Hoarseness 09/05/2017  . Hyperlipidemia associated with type 2 diabetes mellitus ([place]) 09/05/2017  . Balance problem 02/11/2017  . Dysphagia 11/15/2016  . S/P AVR (aortic valve replacement) 04/13/2016  . Encounter for therapeutic drug monitoring 01/02/2016  . Hx of TIA (transient ischemic attack) and stroke 12/06/2015  . Type 2 diabetes mellitus with complication, without long-term current use of insulin ([place])   . Osteoarthritis   . Fibromyalgia   . Atrial fibrillation ([place])   . CHF (congestive heart failure) ([place])   . Esophageal spasm   . Numbness on right side 05/03/2015  . TIA (transient ischemic attack) 05/02/2015  . Essential hypertension 05/02/2015   Current Outpatient Medications on File Prior to Visit  Medication Sig Dispense Refill  . acetaminophen (TYLENOL) 650 MG CR tablet Take  650 mg by mouth every 8 (eight) hours as needed for pain.     Marland Kitchen amiodarone (PACERONE) 400 MG tablet TAKE 400 MG TWICE DAILY FOR 1 WEEK THEN TAKE 200 MG TWICE DAILY FOR 2 WEEKS THEN 200 MG DAILY 180 tablet 1  . amLODipine (NORVASC) 5 MG tablet Take 1 tablet (5 mg total) by mouth daily. 90 tablet 1  . aspirin EC 81 MG tablet Take 81 mg by mouth every morning.     Marland Kitchen atorvastatin (LIPITOR) 80 MG tablet TAKE 1 TABLET (80 MG TOTAL) BY MOUTH DAILY. 90 tablet 1  . calcium citrate-vitamin D (CITRACAL+D) 315-200 MG-UNIT tablet Take 1 tablet by mouth 2 (two) times daily.    . carvedilol (COREG) 6.25 MG tablet Take 1 tablet (6.25 mg total) by mouth 2 (two) times daily. 180 tablet 0  . cetirizine (ZYRTEC) 10 MG tablet Take 10 mg by mouth daily.     . Continuous Blood Gluc Receiver (FREESTYLE LIBRE 14 DAY READER) DEVI 1 application by Does not apply route 3 (three) times daily. 1 Device 5  . Continuous Blood Gluc Sensor (FREESTYLE LIBRE 14 DAY SENSOR) MISC Use to check blood sugar three times daily DX:E11.8 , H54.7 6 each 1  . empagliflozin (JARDIANCE) 10 MG TABS tablet Take 10 mg by mouth daily before supper. 90 tablet 1  . fluticasone (FLONASE) 50 MCG/ACT nasal spray USE 2 SPRAYS IN EACH NOSTRIL EVERY DAY 48 g 0  . furosemide (LASIX) 20 MG tablet Take 20 mg by mouth 3 (three) times daily.    Marland Kitchen gabapentin (NEURONTIN) 100 MG capsule Take 2 tablet  ion the morning and 3 tablets at bedtime 360 capsule 1  . lisinopril (ZESTRIL) 20 MG tablet Take 1 tablet (20 mg total) by mouth daily. 90 tablet 1  . loperamide (IMODIUM A-D) 2 MG tablet Take 2 mg by mouth as needed for diarrhea or loose stools.    . metFORMIN (GLUCOPHAGE) 1000 MG tablet Take 1 tablet (1,000 mg total) by mouth 2 (two) times daily with a meal. 180 tablet 1  . potassium chloride (KLOR-CON) 10 MEQ tablet Take 10 mEq by mouth 2 (two) times daily.    . Probiotic Product (FORTIFY DAILY PROBIOTIC PO) Take 1 capsule by mouth daily.     Marland Kitchen tobramycin (TOBREX)  0.3 % ophthalmic solution Place 1 drop into both eyes See admin instructions. Place 1 drop in both eyes four times daily the day before, day of, and day after eye injections.    . Vitamin D, Ergocalciferol, (DRISDOL) 1.25 MG (50000 UT) CAPS capsule Take 1 capsule (50,000 Units total) by mouth every 7 (seven) days. 12 capsule 1  . warfarin (COUMADIN) 6 MG tablet TAKE 1 TABLET EVERY DAY 90 tablet 3   No current facility-administered medications on file prior to visit.   Allergies  Allergen Reactions  . Penicillins Anaphylaxis and Other (See Comments)    Has patient had a PCN reaction causing immediate rash, facial/tongue/throat swelling, SOB or lightheadedness with hypotension: Yes Has patient had a PCN reaction causing severe rash involving mucus membranes or skin necrosis: Yes Has patient had a PCN reaction that required hospitalization Yes Has patient had a PCN reaction occurring within the last 10 years: No If all of the above answers are "NO", then may proceed with Cephalosporin use.   . Latex Rash    Redness  . Morphine And Related Nausea Only and Other (See Comments)    Dizziness  . Iodine Rash    Redness   . Tape Rash    Redness     Recent Results (from the past 2160 hour(s))  Basic metabolic panel     Status: Abnormal   Collection Time: 02/17/19 11:55 AM  Result Value Ref Range   Sodium 141 135 - 145 mmol/L   Potassium 3.3 (L) 3.5 - 5.1 mmol/L   Chloride 106 98 - 111 mmol/L   CO2 25 22 - 32 mmol/L   Glucose, Bld 114 (H) 70 - 99 mg/dL   BUN 20 8 - 23 mg/dL   Creatinine, Ser 0.89 0.44 - 1.00 mg/dL   Calcium 9.4 8.9 - 10.3 mg/dL   GFR calc non Af Amer >60 >60 mL/min   GFR calc Af Amer >60 >60 mL/min   Anion gap 10 5 - 15    Comment: Performed at [facility], [address], [place], [place] [address]  Magnesium     Status: None   Collection Time: 02/17/19 11:55 AM  Result Value Ref Range   Magnesium 1.9 1.7 - 2.4 mg/dL    Comment: Performed at [facility], [address], [place], [place] [address]  POCT INR     Status: Abnormal   Collection Time: 02/24/19  9:10 AM  Result Value Ref Range   INR 3.5 (A) 2.0 - 3.0  Magnesium     Status: None   Collection Time: 02/24/19 10:05 AM  Result Value Ref Range   Magnesium 2.0 1.7 - 2.4 mg/dL    Comment: Performed at [facility], [address], [place], [place] [id number]  Basic metabolic panel  Status: Abnormal   Collection Time: 02/24/19 10:05 AM  Result Value Ref Range   Sodium 142 135 - 145 mmol/L   Potassium 4.2 3.5 - 5.1 mmol/L   Chloride 107 98 - 111 mmol/L   CO2 24 22 - 32 mmol/L   Glucose, Bld 180 (H) 70 - 99 mg/dL   BUN 17 8 - 23 mg/dL   Creatinine, Ser 0.97 0.44 - 1.00 mg/dL   Calcium 9.1 8.9 - 10.3 mg/dL   GFR calc non Af Amer 57 (L) >60 mL/min   GFR calc Af Amer >60 >60 mL/min   Anion gap 11 5 - 15    Comment: Performed at [facility], [address], [place], [place] [id number]  Basic metabolic panel     Status: Abnormal   Collection Time: 03/02/19  2:38 PM  Result Value Ref Range   Sodium 139 135 - 145 mmol/L   Potassium 3.9 3.5 - 5.1 mmol/L   Chloride 102 98 - 111 mmol/L   CO2 26 22 - 32 mmol/L   Glucose, Bld 164 (H) 70 - 99 mg/dL   BUN 22 8 - 23 mg/dL   Creatinine, Ser 1.01 (H) 0.44 - 1.00 mg/dL   Calcium 9.1 8.9 - 10.3 mg/dL   GFR calc non Af Amer 54 (L) >60 mL/min   GFR calc Af Amer >60 >60 mL/min   Anion gap 11 5 - 15    Comment: Performed at [facility], [address] [place], [place] [address]  POCT INR     Status: Normal   Collection Time: 03/04/19  9:23 AM  Result Value Ref Range   INR 2.2 2.0 - 3.0  POCT INR     Status: Normal   Collection Time: 03/10/19  9:42 AM  Result Value Ref Range   INR 2.5 2.0 - 3.0  SARS CORONAVIRUS 2 (TAT 6-24 HRS) Nasopharyngeal Nasopharyngeal Swab     Status: None   Collection Time: 03/13/19  6:55 AM   Specimen: Nasopharyngeal Swab  Result Value Ref Range   SARS Coronavirus 2 NEGATIVE NEGATIVE    Comment: (NOTE) SARS-CoV-2 target  nucleic acids are NOT DETECTED. The SARS-CoV-2 RNA is generally detectable in upper and lower respiratory specimens during the acute phase of infection. Negative results do not preclude SARS-CoV-2 infection, do not rule out co-infections with other pathogens, and should not be used as the sole basis for treatment or other patient management decisions. Negative results must be combined with clinical observations, patient history, and epidemiological information. The expected result is Negative. Fact Sheet for Patients: SugarRoll.be Fact Sheet for Healthcare Providers: https://www.woods-mathews.com/ This test is not yet approved or cleared by the [place] FDA and  has been authorized for detection and/or diagnosis of SARS-CoV-2 by FDA under an Emergency Use Authorization (EUA). This EUA will remain  in effect (meaning this test can be used) for the duration of the COVID-19 declaration under Section 56 4(b)(1) of the Act, 21 U.S.C. section 360bbb-3(b)(1), unless the authorization is terminated or revoked sooner. Performed at [facility], [place] [address], [place], [place] [id number]   Basic metabolic panel     Status: Abnormal   Collection Time: 03/13/19 10:04 AM  Result Value Ref Range   Sodium 141 135 - 145 mmol/L   Potassium 5.0 3.5 - 5.1 mmol/L   Chloride 101 98 - 111 mmol/L   CO2 29 22 - 32 mmol/L   Glucose, Bld 157 (H) 70 - 99 mg/dL   BUN  21 8 - 23 mg/dL   Creatinine, Ser 0.97 0.44 - 1.00 mg/dL   Calcium 9.2 8.9 - 10.3 mg/dL   GFR calc non Af Amer 57 (L) >60 mL/min   GFR calc Af Amer >60 >60 mL/min   Anion gap 11 5 - 15    Comment: Performed at [facility] [address], [place], [place] [address]  Protime-INR     Status: Abnormal   Collection Time: 03/13/19 10:04 AM  Result Value Ref Range   Prothrombin Time 28.0 (H) 11.4 - 15.2 seconds   INR 2.6 (H) 0.8 - 1.2    Comment: (NOTE) INR goal varies based on device and  disease states. Performed at [facility], [address] [place], [place] [address]   Glucose, capillary     Status: Abnormal   Collection Time: 03/16/19  9:35 AM  Result Value Ref Range   Glucose-Capillary 122 (H) 70 - 99 mg/dL  Protime-INR     Status: Abnormal   Collection Time: 03/16/19  9:54 AM  Result Value Ref Range   Prothrombin Time 25.7 (H) 11.4 - 15.2 seconds   INR 2.4 (H) 0.8 - 1.2    Comment: (NOTE) INR goal varies based on device and disease states. Performed at [facility], [address], [place], [place] [address]   POCT INR     Status: Normal   Collection Time: 03/24/19  9:20 AM  Result Value Ref Range   INR    POCT INR     Status: Normal   Collection Time: 03/24/19  9:21 AM  Result Value Ref Range   INR 2.1 2.0 - 3.0  POCT INR     Status: Normal   Collection Time: 04/01/19  9:18 AM  Result Value Ref Range   INR 2.3 2.0 - 3.0  POCT INR     Status: Normal   Collection Time: 04/08/19  9:07 AM  Result Value Ref Range   INR 2.0 2.0 - 3.0  Hep C Antibody     Status: None   Collection Time: 04/09/19 10:06 AM  Result Value Ref Range   Hepatitis C Ab NON-REACTIVE NON-REACTI   SIGNAL TO CUT-OFF 0.01 <1.00    Comment: . HCV antibody was non-reactive. There is no laboratory  evidence of HCV infection. . In most cases, no further action is required. However, if recent HCV exposure is suspected, a test for HCV RNA (test code [phone]) is suggested. . For additional information please refer to http://education.questdiagnostics.com/faq/FAQ22v1 (This link is being provided for informational/ educational purposes only.) .   Lipid panel     Status: Abnormal   Collection Time: 04/09/19 10:10 AM  Result Value Ref Range   Cholesterol 138 <200 mg/dL   HDL 41 (L) > OR = 50 mg/dL   Triglycerides 197 (H) <150 mg/dL   LDL Cholesterol (Calc) 70 mg/dL (calc)    Comment: Reference range: <100 . Desirable range <100 mg/dL for primary prevention;   <70 mg/dL for patients  with CHD or diabetic patients  with > or = 2 CHD risk factors. Marland Kitchen LDL-C is now calculated using the Martin-Hopkins  calculation, which is a validated novel method providing  better accuracy than the Friedewald equation in the  estimation of LDL-C.  Cresenciano Genre et al. Annamaria Helling. [id number]): 2061-2068  (http://education.[facility].com/faq/FAQ164)    Total CHOL/HDL Ratio 3.4 <5.0 (calc)   Non-HDL Cholesterol (Calc) 97 <130 mg/dL (calc)    Comment: For patients with diabetes plus 1 major ASCVD risk  factor, treating to a non-HDL-C goal of <100 mg/dL  (LDL-C of <70 mg/dL) is considered a therapeutic  option.   Hemoglobin A1c     Status: Abnormal   Collection Time: 04/09/19 10:10 AM  Result Value Ref Range   Hgb A1c MFr Bld 8.0 (H) <5.7 % of total Hgb    Comment: For someone without known diabetes, a hemoglobin A1c value of 6.5% or greater indicates that they may have  diabetes and this should be confirmed with a follow-up  test. . For someone with known diabetes, a value <7% indicates  that their diabetes is well controlled and a value  greater than or equal to 7% indicates suboptimal  control. A1c targets should be individualized based on  duration of diabetes, age, comorbid conditions, and  other considerations. . Currently, no consensus exists regarding use of hemoglobin A1c for diagnosis of diabetes for children. .    Mean Plasma Glucose 183 (calc)   eAG (mmol/L) 10.1 (calc)  COMPLETE METABOLIC PANEL WITH GFR     Status: Abnormal   Collection Time: 04/09/19 10:10 AM  Result Value Ref Range   Glucose, Bld 135 (H) 65 - 99 mg/dL    Comment: .            Fasting reference interval . For someone without known diabetes, a glucose value >125 mg/dL indicates that they may have diabetes and this should be confirmed with a follow-up test. .    BUN 24 7 - 25 mg/dL   Creat 1.10 (H) 0.60 - 0.93 mg/dL    Comment: For patients >21 years of age, the reference limit for  Creatinine is approximately 13% higher for people identified as African-American. .    GFR, Est Non African American 49 (L) > OR = 60 mL/min/1.89m2   GFR, Est African American 56 (L) > OR = 60 mL/min/1.72m2   BUN/Creatinine Ratio 22 6 - 22 (calc)   Sodium 141 135 - 146 mmol/L   Potassium 4.2 3.5 - 5.3 mmol/L   Chloride 101 98 - 110 mmol/L   CO2 27 20 - 32 mmol/L   Calcium 9.9 8.6 - 10.4 mg/dL   Total Protein 7.1 6.1 - 8.1 g/dL   Albumin 4.0 3.6 - 5.1 g/dL   Globulin 3.1 1.9 - 3.7 g/dL (calc)   AG Ratio 1.3 1.0 - 2.5 (calc)   Total Bilirubin 0.4 0.2 - 1.2 mg/dL   Alkaline phosphatase (APISO) 85 37 - 153 U/L   AST 21 10 - 35 U/L   ALT 18 6 - 29 U/L  CBC with Differential/Platelet     Status: Abnormal   Collection Time: 04/09/19 10:10 AM  Result Value Ref Range   WBC 10.3 3.8 - 10.8 Thousand/uL   RBC 5.07 3.80 - 5.10 Million/uL   Hemoglobin 13.1 11.7 - 15.5 g/dL   HCT 40.9 35.0 - 45.0 %   MCV 80.7 80.0 - 100.0 fL   MCH 25.8 (L) 27.0 - 33.0 pg   MCHC 32.0 32.0 - 36.0 g/dL   RDW 14.0 11.0 - 15.0 %   Platelets 544 (H) 140 - 400 Thousand/uL   MPV 9.7 7.5 - 12.5 fL   Neutro Abs 6,922 1,500 - 7,800 cells/uL   Lymphs Abs 2,184 850 - 3,900 cells/uL   Absolute Monocytes 711 200 - 950 cells/uL   Eosinophils Absolute 391 15 - 500 cells/uL   Basophils Absolute 93 0 - 200 cells/uL   Neutrophils Relative % 67.2 %   Total Lymphocyte  21.2 %   Monocytes Relative 6.9 %   Eosinophils Relative 3.8 %   Basophils Relative 0.9 %  POCT INR     Status: Abnormal   Collection Time: 04/15/19  9:28 AM  Result Value Ref Range   INR 3.2 (A) 2.0 - 3.0    Objective: General: Patient is awake, alert, and oriented x 3 and in no acute distress.  Integument: Skin is warm, dry and supple bilateral. Nails are tender, long, thickened and dystrophic with subungual debris, consistent with onychomycosis, 1-5 bilateral with peeling of right hallux nail. No signs of infection. No open lesions or preulcerative  lesions present bilateral. Remaining integument unremarkable.  Vasculature:  Dorsalis Pedis pulse 1/4 bilateral. Posterior Tibial pulse  1/4 bilateral.  Capillary fill time <3 sec 1-5 bilateral. Positive hair growth to the level of the digits. Temperature gradient within normal limits. Mild varicosities present bilateral. Trace edema present bilateral.   Neurology: The patient has intact sensation measured with a 5.07/10g Semmes Weinstein Monofilament at all pedal sites bilateral. Vibratory sensation diminished bilateral with tuning fork. No Babinski sign present bilateral.   Musculoskeletal:  Pes planus and minimal hammertoe pedal deformities noted bilateral.  No tenderness to palpation bilateral.  Muscle strength acceptable for patient status.  Assessment and Plan: Problem List Items Addressed This Visit    None    Visit Diagnoses    Pain due to onychomycosis of toenails of both feet    -  Primary   Diabetic polyneuropathy associated with type 2 diabetes mellitus ([place])          -Examined patient. -ABN signed and on file and a copy provided to patient -Re-discussed and educated patient on diabetic foot care, especially with  regards to the vascular, neurological and musculoskeletal systems.  -Mechanically debrided all nails 1-5 bilateral using sterile nail nipper and filed with dremel without incident  -Continue with good supportive shoes -Return to office in 3 months for follow-up nail care  Landis Martins, DPM

## 2019-05-05 NOTE — Telephone Encounter (Signed)
I spoke with daughter.She understands the reason we will hold on the echo.

## 2019-05-10 ENCOUNTER — Other Ambulatory Visit: Payer: Self-pay | Admitting: Internal Medicine

## 2019-05-11 NOTE — Telephone Encounter (Signed)
rx sent to pharmacy by e-script  

## 2019-05-13 ENCOUNTER — Other Ambulatory Visit: Payer: Self-pay

## 2019-05-13 ENCOUNTER — Ambulatory Visit (INDEPENDENT_AMBULATORY_CARE_PROVIDER_SITE_OTHER): Payer: Medicare Other | Admitting: *Deleted

## 2019-05-13 DIAGNOSIS — Z5181 Encounter for therapeutic drug level monitoring: Secondary | ICD-10-CM | POA: Diagnosis not present

## 2019-05-13 DIAGNOSIS — Z8673 Personal history of transient ischemic attack (TIA), and cerebral infarction without residual deficits: Secondary | ICD-10-CM

## 2019-05-13 DIAGNOSIS — Z23 Encounter for immunization: Secondary | ICD-10-CM | POA: Diagnosis not present

## 2019-05-13 DIAGNOSIS — I4891 Unspecified atrial fibrillation: Secondary | ICD-10-CM | POA: Diagnosis not present

## 2019-05-13 LAB — POCT INR: INR: 5.6 — AB (ref 2.0–3.0)

## 2019-05-13 NOTE — Patient Instructions (Addendum)
4th Post DCCV INR Hold warfarin tomorrow and Friday then decrease dose to 1 tablet daily except 1/2 tablet on Sundays, Tuesday, and Thursdays..  Successful DCCV on 03/16/19.   Recheck in 10 days

## 2019-05-21 ENCOUNTER — Ambulatory Visit: Payer: Medicare Other

## 2019-05-27 ENCOUNTER — Ambulatory Visit (INDEPENDENT_AMBULATORY_CARE_PROVIDER_SITE_OTHER): Payer: Medicare Other | Admitting: *Deleted

## 2019-05-27 ENCOUNTER — Other Ambulatory Visit: Payer: Self-pay

## 2019-05-27 DIAGNOSIS — I4891 Unspecified atrial fibrillation: Secondary | ICD-10-CM | POA: Diagnosis not present

## 2019-05-27 DIAGNOSIS — Z8673 Personal history of transient ischemic attack (TIA), and cerebral infarction without residual deficits: Secondary | ICD-10-CM

## 2019-05-27 DIAGNOSIS — Z5181 Encounter for therapeutic drug level monitoring: Secondary | ICD-10-CM

## 2019-05-27 LAB — POCT INR: INR: 3.8 — AB (ref 2.0–3.0)

## 2019-05-27 NOTE — Patient Instructions (Signed)
Hold warfarin tomorrow then decrease dose to 1/2 tablet daily except 1 tablet on Mondays, Wednesdays and Fridays. Recheck in 2 wks

## 2019-05-28 ENCOUNTER — Ambulatory Visit (INDEPENDENT_AMBULATORY_CARE_PROVIDER_SITE_OTHER): Payer: Medicare Other | Admitting: *Deleted

## 2019-05-28 DIAGNOSIS — I4819 Other persistent atrial fibrillation: Secondary | ICD-10-CM

## 2019-05-28 NOTE — Progress Notes (Signed)
Pt here for EKG - c/o SOB today- has had 2 spells of dizziness/nausea/sweating 3 days ago lasting 30 mins - EKG done and will forward to provider - taking 200 mg amiodarone daily

## 2019-05-29 ENCOUNTER — Other Ambulatory Visit: Payer: Self-pay

## 2019-05-29 ENCOUNTER — Ambulatory Visit: Payer: Medicare Other | Admitting: Orthotics

## 2019-05-29 DIAGNOSIS — M2042 Other hammer toe(s) (acquired), left foot: Secondary | ICD-10-CM | POA: Diagnosis not present

## 2019-05-29 DIAGNOSIS — M2142 Flat foot [pes planus] (acquired), left foot: Secondary | ICD-10-CM | POA: Diagnosis not present

## 2019-05-29 DIAGNOSIS — M2041 Other hammer toe(s) (acquired), right foot: Secondary | ICD-10-CM | POA: Diagnosis not present

## 2019-05-29 DIAGNOSIS — E119 Type 2 diabetes mellitus without complications: Secondary | ICD-10-CM | POA: Diagnosis not present

## 2019-05-29 DIAGNOSIS — M2141 Flat foot [pes planus] (acquired), right foot: Secondary | ICD-10-CM | POA: Diagnosis not present

## 2019-05-29 NOTE — Addendum Note (Signed)
Addended by: Julian Hy T on: 05/29/2019 03:28 PM   Modules accepted: Orders

## 2019-05-29 NOTE — Progress Notes (Signed)
EKG shows she is back in a normal rhythm which is good news, heart rates are a little low. Can we lower her coreg to 3.125mg  bid to see if helps with HRs and dizziness   Zandra Abts MD

## 2019-05-29 NOTE — Progress Notes (Addendum)
Pt voiced understanding - updated medication list and will forward to schedulers to schedule echo - per last phone note

## 2019-06-02 ENCOUNTER — Other Ambulatory Visit: Payer: Self-pay | Admitting: *Deleted

## 2019-06-02 ENCOUNTER — Other Ambulatory Visit: Payer: Self-pay | Admitting: Internal Medicine

## 2019-06-02 DIAGNOSIS — I4819 Other persistent atrial fibrillation: Secondary | ICD-10-CM

## 2019-06-02 DIAGNOSIS — M8580 Other specified disorders of bone density and structure, unspecified site: Secondary | ICD-10-CM

## 2019-06-03 ENCOUNTER — Other Ambulatory Visit: Payer: Self-pay | Admitting: Cardiology

## 2019-06-04 ENCOUNTER — Telehealth: Payer: Self-pay | Admitting: Cardiology

## 2019-06-04 NOTE — Telephone Encounter (Signed)
Pre-cert Verification for the following procedure    ECHO  Scheduled for 07/01/2019 at [facility]

## 2019-06-08 ENCOUNTER — Other Ambulatory Visit: Payer: Self-pay | Admitting: Family

## 2019-06-08 ENCOUNTER — Other Ambulatory Visit: Payer: Self-pay | Admitting: Internal Medicine

## 2019-06-08 DIAGNOSIS — E1140 Type 2 diabetes mellitus with diabetic neuropathy, unspecified: Secondary | ICD-10-CM

## 2019-06-08 DIAGNOSIS — I10 Essential (primary) hypertension: Secondary | ICD-10-CM

## 2019-06-08 NOTE — Telephone Encounter (Signed)
rx sent to pharmacy by e-script  

## 2019-06-10 DIAGNOSIS — Z23 Encounter for immunization: Secondary | ICD-10-CM | POA: Diagnosis not present

## 2019-06-11 ENCOUNTER — Other Ambulatory Visit: Payer: Self-pay

## 2019-06-11 ENCOUNTER — Ambulatory Visit (INDEPENDENT_AMBULATORY_CARE_PROVIDER_SITE_OTHER): Payer: Medicare Other | Admitting: *Deleted

## 2019-06-11 DIAGNOSIS — I4891 Unspecified atrial fibrillation: Secondary | ICD-10-CM | POA: Diagnosis not present

## 2019-06-11 DIAGNOSIS — Z8673 Personal history of transient ischemic attack (TIA), and cerebral infarction without residual deficits: Secondary | ICD-10-CM

## 2019-06-11 DIAGNOSIS — Z5181 Encounter for therapeutic drug level monitoring: Secondary | ICD-10-CM

## 2019-06-11 LAB — POCT INR: INR: 2.9 (ref 2.0–3.0)

## 2019-06-11 NOTE — Patient Instructions (Signed)
Continue warfarin 1/2 tablet daily except 1 tablet on Mondays, Wednesdays and Fridays  Recheck in 4 wks.   

## 2019-06-15 ENCOUNTER — Encounter: Payer: Self-pay | Admitting: Internal Medicine

## 2019-06-15 NOTE — Telephone Encounter (Signed)
Message routed to Reed, Tiffany L, DO  

## 2019-06-16 NOTE — Telephone Encounter (Signed)
Additional response sent to [employer], DO

## 2019-06-18 ENCOUNTER — Other Ambulatory Visit: Payer: Self-pay | Admitting: Internal Medicine

## 2019-06-18 MED ORDER — CARVEDILOL 3.125 MG PO TABS: 3.1250 mg | ORAL_TABLET | Freq: Two times a day (BID) | ORAL | 1 refills | Status: DC

## 2019-06-18 NOTE — Telephone Encounter (Signed)
Yes, please refill per cardiology recommendation at coreg 3.125mg  po bid

## 2019-06-18 NOTE — Telephone Encounter (Signed)
Refill denied per cardiology, dose changed:  Progress Notes by Arnoldo Lenis, MD at 05/28/2019 9:15 AM Author: Arnoldo Lenis, MD Author Type: Physician Filed: 05/29/2019 2:45 PM  Note Status: Signed Cosign: Cosign Not Required Encounter Date: 05/28/2019  Editor: Arnoldo Lenis, MD (Physician)    EKG shows she is back in a normal rhythm which is good news, heart rates are a little low. Can we lower her coreg to 3.125mg  bid to see if helps with HRs and dizziness     Ok to refill at cardiology dose?

## 2019-06-18 NOTE — Telephone Encounter (Signed)
Called daughter and left message on VM, per cardilogy this is a trial to see if the lower dose will help, the refill request come from [facility] with is mail order pharmacy that doses 3 mth at time, called to ask daughter if she would like this sent local because this is a trial? Or send to [facility] for 90 day? Which she might now use all of these?

## 2019-06-19 NOTE — Telephone Encounter (Signed)
Sorry for delay, have been rounding in the hospital all week and just saw your message. HRs getting too low, please have her stop her coreg all together and keep [place] informed  Zandra Abts MD

## 2019-06-22 NOTE — Telephone Encounter (Signed)
I think the issue is low heart rates. Her heart rate was 48 by last ekg, we had lowered the coreg then. Heart rates in the 30s to 40s would completley explain those episodes. There are lots of other things we could do for her bp if needed other than coreg. I would recommend stopping the coreg, if bp's increase let us know and we could increase her norvasc. If symptoms continue off coreg then would consider heart monitor to see if heart rates are consistenly low. In general likely the amiodarone combined with the coreg is likely getting her heart rates too low.    Zandra Abts MD

## 2019-06-26 ENCOUNTER — Encounter: Payer: Self-pay | Admitting: Internal Medicine

## 2019-06-29 ENCOUNTER — Other Ambulatory Visit: Payer: Self-pay

## 2019-06-29 NOTE — Telephone Encounter (Signed)
Med list updated

## 2019-07-01 ENCOUNTER — Ambulatory Visit (INDEPENDENT_AMBULATORY_CARE_PROVIDER_SITE_OTHER): Payer: Medicare Other

## 2019-07-01 ENCOUNTER — Other Ambulatory Visit: Payer: Self-pay

## 2019-07-01 DIAGNOSIS — I4819 Other persistent atrial fibrillation: Secondary | ICD-10-CM

## 2019-07-09 ENCOUNTER — Other Ambulatory Visit: Payer: Self-pay

## 2019-07-09 ENCOUNTER — Ambulatory Visit (INDEPENDENT_AMBULATORY_CARE_PROVIDER_SITE_OTHER): Payer: Medicare Other | Admitting: *Deleted

## 2019-07-09 DIAGNOSIS — Z8673 Personal history of transient ischemic attack (TIA), and cerebral infarction without residual deficits: Secondary | ICD-10-CM

## 2019-07-09 DIAGNOSIS — I4891 Unspecified atrial fibrillation: Secondary | ICD-10-CM

## 2019-07-09 DIAGNOSIS — Z5181 Encounter for therapeutic drug level monitoring: Secondary | ICD-10-CM

## 2019-07-09 LAB — POCT INR: INR: 1.9 — AB (ref 2.0–3.0)

## 2019-07-09 NOTE — Patient Instructions (Signed)
Take extra 1/2 tablet of warfarin today the resume 1/2 tablet daily except 1 tablet on Mondays, Wednesdays and Fridays. Recheck in 4 wks

## 2019-07-15 ENCOUNTER — Ambulatory Visit
Admission: RE | Admit: 2019-07-15 | Discharge: 2019-07-15 | Disposition: A | Discharge status: Home / Self Care | Payer: Medicare Other | Source: Ambulatory Visit | Attending: Internal Medicine | Admitting: Internal Medicine

## 2019-07-15 ENCOUNTER — Other Ambulatory Visit: Payer: Self-pay

## 2019-07-15 DIAGNOSIS — E2839 Other primary ovarian failure: Secondary | ICD-10-CM

## 2019-07-15 DIAGNOSIS — Z780 Asymptomatic menopausal state: Secondary | ICD-10-CM | POA: Diagnosis not present

## 2019-07-15 DIAGNOSIS — M8580 Other specified disorders of bone density and structure, unspecified site: Secondary | ICD-10-CM

## 2019-07-15 DIAGNOSIS — M8589 Other specified disorders of bone density and structure, multiple sites: Secondary | ICD-10-CM | POA: Diagnosis not present

## 2019-07-16 ENCOUNTER — Encounter: Payer: Self-pay | Admitting: Internal Medicine

## 2019-07-16 NOTE — Progress Notes (Signed)
Bone density shows osteopenia.  If bones thin any further, she will be in osteoporosis range.  It's crucial that she continue her vitamin D supplementation and do very well with weightbearing status.

## 2019-07-21 ENCOUNTER — Encounter: Payer: Self-pay | Admitting: Internal Medicine

## 2019-07-21 NOTE — Telephone Encounter (Signed)
Message routed to provider Mariea Clonts, Tiffany L, DO .

## 2019-07-22 ENCOUNTER — Encounter: Payer: Self-pay | Admitting: Internal Medicine

## 2019-07-22 ENCOUNTER — Other Ambulatory Visit: Payer: Self-pay

## 2019-07-22 DIAGNOSIS — E1140 Type 2 diabetes mellitus with diabetic neuropathy, unspecified: Secondary | ICD-10-CM

## 2019-07-22 MED ORDER — EMPAGLIFLOZIN 10 MG PO TABS: 10.0000 mg | ORAL_TABLET | Freq: Every day | ORAL | 0 refills | Status: DC

## 2019-07-22 MED ORDER — POTASSIUM CHLORIDE ER 10 MEQ PO TBCR: 10.0000 meq | EXTENDED_RELEASE_TABLET | Freq: Two times a day (BID) | ORAL | 0 refills | Status: DC

## 2019-07-22 NOTE — Telephone Encounter (Signed)
Patient medication sent into requested pharmacy as directed. Citracal Max Plus added to medication list. Patient called and voicemail left to give office a call back to notify patient of medications sent into pharmacy.

## 2019-07-22 NOTE — Telephone Encounter (Signed)
Daughter "[name]" called back and states that she will go and pick up medication. Patient daughter also states that mom had a fall on Wednesday 07/15/2019. Patient daughter states that mom has no swelling, no tenderness, but she has light bruising on her back. Patient daughter states mom verbally states that she's not in any pain from the fall. Daughter want's to know if you want her mother to have an X-Ray with her T scores being so low. To make sure that she doesn't have any fractures. Please Advise.

## 2019-07-22 NOTE — Telephone Encounter (Signed)
Patient called and no answer. Voicemail was left with office call back number.

## 2019-07-23 NOTE — Telephone Encounter (Signed)
Spoke to patient daughter and she understood.

## 2019-08-03 ENCOUNTER — Encounter: Payer: Self-pay | Admitting: Cardiology

## 2019-08-03 ENCOUNTER — Ambulatory Visit (INDEPENDENT_AMBULATORY_CARE_PROVIDER_SITE_OTHER): Payer: Medicare Other | Admitting: *Deleted

## 2019-08-03 ENCOUNTER — Other Ambulatory Visit: Payer: Self-pay

## 2019-08-03 ENCOUNTER — Ambulatory Visit (INDEPENDENT_AMBULATORY_CARE_PROVIDER_SITE_OTHER): Payer: Medicare Other | Admitting: Cardiology

## 2019-08-03 VITALS — BP 140/62 | HR 57 | Ht 61.0 in | Wt 169.4 lb

## 2019-08-03 DIAGNOSIS — I4891 Unspecified atrial fibrillation: Secondary | ICD-10-CM | POA: Diagnosis not present

## 2019-08-03 DIAGNOSIS — I5022 Chronic systolic (congestive) heart failure: Secondary | ICD-10-CM | POA: Diagnosis not present

## 2019-08-03 DIAGNOSIS — Z8673 Personal history of transient ischemic attack (TIA), and cerebral infarction without residual deficits: Secondary | ICD-10-CM

## 2019-08-03 DIAGNOSIS — I639 Cerebral infarction, unspecified: Secondary | ICD-10-CM

## 2019-08-03 DIAGNOSIS — Z5181 Encounter for therapeutic drug level monitoring: Secondary | ICD-10-CM | POA: Diagnosis not present

## 2019-08-03 DIAGNOSIS — I10 Essential (primary) hypertension: Secondary | ICD-10-CM | POA: Diagnosis not present

## 2019-08-03 LAB — POCT INR: INR: 1.4 — AB (ref 2.0–3.0)

## 2019-08-03 MED ORDER — AMIODARONE HCL 200 MG PO TABS
100.0000 mg | ORAL_TABLET | Freq: Every day | ORAL | 1 refills | Status: AC
Start: 2019-08-03 — End: ?

## 2019-08-03 NOTE — Progress Notes (Signed)
Clinical Summary Jeanette Yates is a 79 y.o.female seen today for follow up of the following medical problems.   1. Valvular heart disease - echo 10/2013 severe MR, moderate AI - TEE 11/2013 LVEF 25-30%, severe MR due to MV anular dilatation, moderate AI -11/2013 s/p 23 mm Hancock II porcine valve AVR, MV repair with Medtronic 3D ring. Postop complicated by pleural effusion, afib - echo 04/2015 mild MR, normally functioning AVR. LVEF 50-55% - 08/2016 echo LVEF 65%, normal AVR, no significant MR.   - 06/2019 normal MV repair, normal AVR.  - no symptoms    2. HTN -compliant with meds  - actually low bp's today 79/41. Weight 159 lbs, down from 165 lbs just a few days ago. Labs on 12/7 showed mild uptrend in Cr.  - mild dizziness. No fevers or chills, no N/V/D  -compliant with meds  3. Hyperlipidemia -compliant with statin  4. CAD - she reports prior MI - CABG 11/2013 SVG-RCA at time of valve surgery -denies any symptoms   5.History of chronic systolic HF, now with normalized LVEF - echo 10/2013 LVEF 30-35%, severe MR - cath 11/2013 without signifaicant CAD - 04/2015 echo LVEF 50-55%  -during 12/2018 admissoin with CVA and COVID +, LVEF found to be decreased 30-35% -increased SOB/DOE - + LE edema. +orthopnea. +abdominal distension - home weight today was 166 lbs.   - home weights increased to 170 lbs, we increased lasix to 40mg  bid. Signicant weight loss, down to 159 lbs today> breathing much improved but having some low bps  - weight today 161 lbs, overall stable. Taking lasix 20mg  tid. SOB much improved. No recent edema.  - Jan 14 labs Cr 1.1 BUN 24  06/2019 echo LVEF 50%.  - baseline weight she does well at is 161-163 lbs - no recent edema. Home weights 169 lbs  6. PAF -12/2018 admit with CVA and COVID+, in that setting issues with afib with RVR - has been on coumadin - rate control may be difficult due to baseline low normal heart  rates.  - seen by EP recs to pursue cardioversion, discussed antiarrhythmics but patient was not in favor   03/16/19 succesful DCCV - pcp visit Jan 14 back in afib elevated rates. Coreg restarted - ongoing palpiatins.  - recurrent afib after cardioversion, she was started on amio - was having some low HRs in the 30s to 40s, we stopped her coreg.   - photosensitivity recently perhaps from [facility] - has not had repeat labs recently on amio  7. TIA/CVA - TIA 09/05/16 while in [place]. INR was 2.9 at the time.  - followed by Dr [name]  - admitted with CVA 12/2018   Completed vaccine x 2.  Past Medical History:  Diagnosis Date  . Adjustment disorder 10/17/2005  . Atrial fibrillation ([place])   . Cataract    bilateral  . CHF (congestive heart failure) ([place])   . Contracture of knee joint 07/26/2009  . Coronary artery disease   . Degeneration of lumbar or lumbosacral intervertebral disc 07/26/2009  . Diabetes mellitus without complication ([place])   . Esophageal spasm   . Fibromyalgia   . Hearing loss 12/18/2010  . Heart murmur   . Hypercholesteremia   . Hypertension   . 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 Leaky heart valve   . Lyme disease   . Macular degeneration of both eyes   . Migraine 10/17/2005  . Mixed incontinence 10/17/2005  .  Osteoarthritis    multiple joints   . Overweight 09/19/2010  . Pneumonia   . PONV (postoperative nausea and vomiting)   . Postartificial menopausal syndrome 10/10/2009  . Prinzmetal angina ([place]) 10/17/2005  . Psoriasis 06/12/2006  . Sciatica   . TIA (transient ischemic attack)   . Type II diabetes mellitus ([place])   . Urolith 10/17/2005     Allergies  Allergen Reactions  . Penicillins Anaphylaxis and Other (See Comments)    Has patient had a PCN reaction causing immediate rash, facial/tongue/throat swelling, SOB or lightheadedness with hypotension: Yes Has patient had a PCN reaction causing severe  rash involving mucus membranes or skin necrosis: Yes Has patient had a PCN reaction that required hospitalization Yes Has patient had a PCN reaction occurring within the last 10 years: No If all of the above answers are "NO", then may proceed with Cephalosporin use.   . Latex Rash    Redness  . Morphine And Related Nausea Only and Other (See Comments)    Dizziness  . Iodine Rash    Redness   . Tape Rash    Redness      Current Outpatient Medications  Medication Sig Dispense Refill  . acetaminophen (TYLENOL) 650 MG CR tablet Take 650 mg by mouth every 8 (eight) hours as needed for pain.     Marland Kitchen amiodarone (PACERONE) 200 MG tablet Take 200 mg by mouth daily.    Marland Kitchen amLODipine (NORVASC) 5 MG tablet Take 1 tablet (5 mg total) by mouth daily. 90 tablet 1  . aspirin EC 81 MG tablet Take 81 mg by mouth every morning.     Marland Kitchen atorvastatin (LIPITOR) 80 MG tablet TAKE 1 TABLET EVERY DAY 90 tablet 1  . cetirizine (ZYRTEC) 10 MG tablet Take 10 mg by mouth daily.     . Continuous Blood Gluc Receiver (FREESTYLE LIBRE 14 DAY READER) DEVI 1 application by Does not apply route 3 (three) times daily. 1 Device 5  . Continuous Blood Gluc Sensor (FREESTYLE LIBRE 14 DAY SENSOR) MISC Use to check blood sugar three times daily DX:E11.8 , H54.7 6 each 1  . empagliflozin (JARDIANCE) 10 MG TABS tablet Take 10 mg by mouth daily before supper. 12 tablet 0  . fluticasone (FLONASE) 50 MCG/ACT nasal spray USE 2 SPRAYS IN EACH NOSTRIL EVERY DAY 48 g 0  . furosemide (LASIX) 20 MG tablet Take 20 mg by mouth 3 (three) times daily.    Marland Kitchen gabapentin (NEURONTIN) 100 MG capsule Take 2 tablet ion the morning and 3 tablets at bedtime 360 capsule 1  . lisinopril (ZESTRIL) 20 MG tablet TAKE 1 TABLET (20 MG TOTAL) BY MOUTH DAILY. 90 tablet 1  . loperamide (IMODIUM A-D) 2 MG tablet Take 2 mg by mouth as needed for diarrhea or loose stools.    . metFORMIN (GLUCOPHAGE) 1000 MG tablet TAKE 1 TABLET TWICE DAILY WITH MEALS 180 tablet 0  .  Multiple Minerals-Vitamins (CITRACAL MAXIMUM PLUS PO) Take 2 tablets by mouth daily. 650 mg of Calcium per dose. ( Total 1300mg  Daily ) 25 mcg ( 1,000ICU) Vitamin D per dose.  Total 50 mcg ( 2,000 Daily )    . potassium chloride (KLOR-CON) 10 MEQ tablet Take 1 tablet (10 mEq total) by mouth 2 (two) times daily. 50 tablet 0  . tobramycin (TOBREX) 0.3 % ophthalmic solution Place 1 drop into both eyes See admin instructions. Place 1 drop in both eyes four times daily the day before, day of, and day after  eye injections.    . Vitamin D, Ergocalciferol, (DRISDOL) 1.25 MG (50000 UNIT) CAPS capsule TAKE 1 CAPSULE (50,000 UNITS TOTAL) BY MOUTH EVERY 7 (SEVEN) DAYS. 12 capsule 1  . warfarin (COUMADIN) 6 MG tablet TAKE 1 TABLET EVERY DAY 90 tablet 3   No current facility-administered medications for this visit.     Past Surgical History:  Procedure Laterality Date  . ABDOMINAL HYSTERECTOMY    . AORTIC VALVE REPLACEMENT  12/10/2013   porcine  . CARDIOVERSION N/A 03/16/2019   Procedure: CARDIOVERSION;  Surgeon: Fay Records, MD;  Location: [facility];  Service: Cardiovascular;  Laterality: N/A;  . CATARACT EXTRACTION, BILATERAL    . CHOLECYSTECTOMY  1981  . COLONOSCOPY  04/18/2006   Dr. Nelva Nay: internal hemorrhoids  . COLONOSCOPY  10/2003   Dr. Alphonsa Gin: hemorrhoids  . CORONARY ARTERY BYPASS GRAFT  2015  . ESOPHAGOGASTRODUODENOSCOPY  10/16/2013   Dr. Marla Roe: prior Nissen fundoplication intact, hypertonic LES, dilated up to 39 [place] with moderate resistance, gastritis but no H. pylori., Reactive gastritis, no celiac disease.  . ESOPHAGOGASTRODUODENOSCOPY  05/29/2012   Dr. Doy Mince: Moderately severe esophagitis, acute gastritis reactive, no H pylori, no celiac. Esophageal biopsies consistent with GERD, no Barrett  . ESOPHAGOGASTRODUODENOSCOPY  03/21/2009   Dr. Doy Mince: Reflux esophagitis, gastritis without H. pylori, esophagus stretched 57 savory  . ESOPHAGOGASTRODUODENOSCOPY  02/20/2008    Dr. Doy Mince: Esophagus dilated to 10 French, reactive gastropathy with no H pylori. No Barrett's on esophageal biopsy  . ESOPHAGOGASTRODUODENOSCOPY  09/24/2006   Dr. Doy Mince: Tight wrap noted, reactive gastropathy, no Barrett's  . ESOPHAGOGASTRODUODENOSCOPY (EGD) WITH PROPOFOL N/A 01/09/2017   Procedure: ESOPHAGOGASTRODUODENOSCOPY (EGD) WITH PROPOFOL;  Surgeon: Daneil Dolin, MD;  Location: [facility];  Service: Endoscopy;  Laterality: N/A;  2:15PM  . FRACTURE SURGERY Left    wrist  . JOINT REPLACEMENT    . MALONEY DILATION N/A 01/09/2017   Procedure: Venia Minks DILATION;  Surgeon: Daneil Dolin, MD;  Location: [facility];  Service: Endoscopy;  Laterality: N/A;  . MITRAL VALVE REPAIR  11/2013  . NISSEN FUNDOPLICATION  [id number]  . REPLACEMENT TOTAL KNEE BILATERAL  2010/2013     Allergies  Allergen Reactions  . Penicillins Anaphylaxis and Other (See Comments)    Has patient had a PCN reaction causing immediate rash, facial/tongue/throat swelling, SOB or lightheadedness with hypotension: Yes Has patient had a PCN reaction causing severe rash involving mucus membranes or skin necrosis: Yes Has patient had a PCN reaction that required hospitalization Yes Has patient had a PCN reaction occurring within the last 10 years: No If all of the above answers are "NO", then may proceed with Cephalosporin use.   . Latex Rash    Redness  . Morphine And Related Nausea Only and Other (See Comments)    Dizziness  . Iodine Rash    Redness   . Tape Rash    Redness       Family History  Problem Relation Age of Onset  . Stroke Mother   . Heart failure Mother   . Heart disease Mother   . Mitral valve prolapse Mother   . Diabetes Father   . Heart attack Father   . Stroke Father   . Heart disease Father   . Hypertension Father   . Alzheimer's disease Father   . Stroke Maternal Grandmother   . Arthritis Maternal Grandfather        hands  . Breast cancer Paternal Grandmother   .  Osteoporosis  Paternal Grandmother   . Breast cancer Cousin   . Colon cancer Neg Hx      Social History Ms. Gott reports that she quit smoking about 52 years ago. Her smoking use included cigarettes. She has a 20.00 pack-year smoking history. She has never used smokeless tobacco. Ms. Sadosky reports no history of alcohol use.   Review of Systems CONSTITUTIONAL: No weight loss, fever, chills, weakness or fatigue.  HEENT: Eyes: No visual loss, blurred vision, double vision or yellow sclerae.No hearing loss, sneezing, congestion, runny nose or sore throat.  SKIN: No rash or itching.  CARDIOVASCULAR: per hpi RESPIRATORY: No shortness of breath, cough or sputum.  GASTROINTESTINAL: No anorexia, nausea, vomiting or diarrhea. No abdominal pain or blood.  GENITOURINARY: No burning on urination, no polyuria NEUROLOGICAL: No headache, dizziness, syncope, paralysis, ataxia, numbness or tingling in the extremities. No change in bowel or bladder control.  MUSCULOSKELETAL: No muscle, back pain, joint pain or stiffness.  LYMPHATICS: No enlarged nodes. No history of splenectomy.  PSYCHIATRIC: No history of depression or anxiety.  ENDOCRINOLOGIC: No reports of sweating, cold or heat intolerance. No polyuria or polydipsia.  Marland Kitchen   Physical Examination Today's Vitals   08/03/19 1022  BP: 140/62  Pulse: (!) 57  Temp: (!) 91.6 F (33.1 C)  SpO2: 97%  Weight: 169 lb 6.4 oz (76.8 kg)  Height: 5\' 1"  (1.549 m)   Body mass index is 32.01 kg/m.  Gen: resting comfortably, no acute distress HEENT: no scleral icterus, pupils equal round and reactive, no palptable cervical adenopathy,  CV: RRR, no m/r/g, no jvd Resp: Clear to auscultation bilaterally GI: abdomen is soft, non-tender, non-distended, normal bowel sounds, no hepatosplenomegaly MSK: extremities are warm, no edema.  Skin: warm, no rash Neuro:  no focal deficits Psych: appropriate affect   Diagnostic Studies 04/2015 echo Study  Conclusions  - Left ventricle: The cavity size was normal. Wall thickness was  increased in a pattern of mild LVH. Systolic function was low  normal. The estimated ejection fraction was in the range of 50%  to 55%. Wall motion was normal; there were no regional wall  motion abnormalities. The study is not technically sufficient to  allow evaluation of LV diastolic function. - Aortic valve: Normally functioning bioprosthetic aortic valve  noted. There was no stenosis. There was no regurgitation. Peak  velocity (S): 282 cm/s. Mean gradient (S): 17 mm Hg. - Aorta: Mild ascending aortic dilatation. Maximal diameter 3.73  cm. - Mitral valve: S/p mitral valve repair. There was mild  regurgitation. Valve area by pressure half-time: 1.39 cm^2. - Left atrium: The atrium was moderately to severely dilated. - Right ventricle: Systolic function was mildly reduced. - Tricuspid valve: There was mild regurgitation. - Pulmonary arteries: Systolic pressure was mildly increased. PA  peak pressure: 33 mm Hg (S).  04/2015 Carotid US IMPRESSION: 1. Moderate right carotid bifurcation atherosclerotic vascular disease. Visually degree of stenosis in the 50-69% range. Elevation of flow velocity ratios also present.  2. Mild left carotid bifurcation atherosclerotic vascular disease. Degree of stenosis less than 50%.  3. Vertebral arteries are patent with antegrade flow.    Assessment and Plan  1. Chronic systolic HF - LVEFpreviouslynormalized s/p valve surgery and CABG - recurrent drop in LVEF in setting of COVID pneumonia that has once again normalized - continue to  monitor   2. PAF - . CHADS2Vasc score of 7, continue coumadin.She has also been on ASA due to prior recurrent TIAs - doing well on amio,  coreg stopped due to bradycardia - some recent photosensitivity, lower amio to 100mg  daily.  - if cannto toelrate amio long term then reconsider tikasyn - EKG shows mild sinus  brady today  3. HTN - reasonable control, prior issues with low and high bp's.    F/u 4 months  Arnoldo Lenis, M.D.

## 2019-08-03 NOTE — Patient Instructions (Signed)
Take warfarin 1 1/2 tablets today then increase dose to 1 tablet daily except 1/2 tablet on Sundays and Wednesdays. Recheck in 2 wks Leaving for [place].  Will recheck INR week of 5/24 there with results to me. 2 lab orders printed

## 2019-08-03 NOTE — Patient Instructions (Signed)
Your physician recommends that you schedule a follow-up appointment in: [place] has recommended you make the following change in your medication:   DECREASE AMIODARONE 100 MG DAILY   Your physician recommends that you return for lab work CBC/TSH/CMP  Thank you for choosing [facility]!!

## 2019-08-04 ENCOUNTER — Encounter: Payer: Self-pay | Admitting: Sports Medicine

## 2019-08-04 ENCOUNTER — Ambulatory Visit (INDEPENDENT_AMBULATORY_CARE_PROVIDER_SITE_OTHER): Payer: Medicare Other | Admitting: Sports Medicine

## 2019-08-04 ENCOUNTER — Other Ambulatory Visit (HOSPITAL_COMMUNITY)
Admission: RE | Admit: 2019-08-04 | Discharge: 2019-08-04 | Disposition: A | Discharge status: Home / Self Care | Payer: Medicare Other | Source: Ambulatory Visit | Attending: Cardiology | Admitting: Cardiology

## 2019-08-04 VITALS — Temp 96.9°F

## 2019-08-04 DIAGNOSIS — I4891 Unspecified atrial fibrillation: Secondary | ICD-10-CM | POA: Diagnosis not present

## 2019-08-04 DIAGNOSIS — M79675 Pain in left toe(s): Secondary | ICD-10-CM

## 2019-08-04 DIAGNOSIS — E1142 Type 2 diabetes mellitus with diabetic polyneuropathy: Secondary | ICD-10-CM

## 2019-08-04 DIAGNOSIS — B351 Tinea unguium: Secondary | ICD-10-CM | POA: Diagnosis not present

## 2019-08-04 DIAGNOSIS — M79674 Pain in right toe(s): Secondary | ICD-10-CM | POA: Diagnosis not present

## 2019-08-04 LAB — CBC
HCT: 34.1 % — ABNORMAL LOW (ref 36.0–46.0)
Hemoglobin: 10.3 g/dL — ABNORMAL LOW (ref 12.0–15.0)
MCH: 26.8 pg (ref 26.0–34.0)
MCHC: 30.2 g/dL (ref 30.0–36.0)
MCV: 88.8 fL (ref 80.0–100.0)
Platelets: 491 10*3/uL — ABNORMAL HIGH (ref 150–400)
RBC: 3.84 MIL/uL — ABNORMAL LOW (ref 3.87–5.11)
RDW: 15.1 % (ref 11.5–15.5)
WBC: 9 10*3/uL (ref 4.0–10.5)
nRBC: 0 % (ref 0.0–0.2)

## 2019-08-04 LAB — COMPREHENSIVE METABOLIC PANEL
ALT: 19 U/L (ref 0–44)
AST: 25 U/L (ref 15–41)
Albumin: 3.8 g/dL (ref 3.5–5.0)
Alkaline Phosphatase: 82 U/L (ref 38–126)
Anion gap: 12 (ref 5–15)
BUN: 41 mg/dL — ABNORMAL HIGH (ref 8–23)
CO2: 29 mmol/L (ref 22–32)
Calcium: 9.7 mg/dL (ref 8.9–10.3)
Chloride: 101 mmol/L (ref 98–111)
Creatinine, Ser: 1.34 mg/dL — ABNORMAL HIGH (ref 0.44–1.00)
GFR calc Af Amer: 44 mL/min — ABNORMAL LOW (ref >60)
GFR calc non Af Amer: 38 mL/min — ABNORMAL LOW (ref >60)
Glucose, Bld: 101 mg/dL — ABNORMAL HIGH (ref 70–99)
Potassium: 4.2 mmol/L (ref 3.5–5.1)
Sodium: 142 mmol/L (ref 135–145)
Total Bilirubin: 0.3 mg/dL (ref 0.3–1.2)
Total Protein: 7.5 g/dL (ref 6.5–8.1)

## 2019-08-04 LAB — TSH: TSH: 3.562 u[IU]/mL (ref 0.350–4.500)

## 2019-08-04 NOTE — Progress Notes (Signed)
Subjective: Jeanette Yates is a 79 y.o. female patient with history of diabetes who returns to office today complaining of long,mildly painful nails while ambulating in shoes; unable to trim. Patient states that the glucose reading this morning was not recorded.  Patient denies any new problems or pedal complaints.  Continues to take Coumadin as previously documented.  Patient is assisted by daughter this visit who reports that they decrease her amiodarone to 100 mg/day and they resumed her calcium which was previously discontinued at the last doctor's visit.  Patient Active Problem List   Diagnosis Date Noted  . COVID-19 virus infection 12/24/2018  . Acute ischemic stroke ([place]) 12/24/2018  . Stroke ([place]) 12/24/2018  . Grief reaction with prolonged bereavement 04/24/2018  . Senile osteopenia 04/24/2018  . Injury of right wrist 04/24/2018  . Plantar fasciitis, bilateral 04/24/2018  . Irritable bowel syndrome with both constipation and diarrhea 04/24/2018  . Lactose intolerance 04/24/2018  . Influenza 04/24/2018  . Body mass index (BMI) of 30.0-30.9 in adult 09/05/2017  . Class 1 obesity due to excess calories with serious comorbidity and body mass index (BMI) of 30.0 to 30.9 in adult 09/05/2017  . Seasonal allergies 09/05/2017  . Hoarseness 09/05/2017  . Hyperlipidemia associated with type 2 diabetes mellitus ([place]) 09/05/2017  . Balance problem 02/11/2017  . Dysphagia 11/15/2016  . S/P AVR (aortic valve replacement) 04/13/2016  . Encounter for therapeutic drug monitoring 01/02/2016  . Hx of TIA (transient ischemic attack) and stroke 12/06/2015  . Type 2 diabetes mellitus with complication, without long-term current use of insulin ([place])   . Osteoarthritis   . Fibromyalgia   . Atrial fibrillation ([place])   . CHF (congestive heart failure) ([place])   . Esophageal spasm   . Numbness on right side 05/03/2015  . TIA (transient ischemic attack) 05/02/2015  . Essential hypertension 05/02/2015    Current Outpatient Medications on File Prior to Visit  Medication Sig Dispense Refill  . acetaminophen (TYLENOL) 650 MG CR tablet Take 650 mg by mouth every 8 (eight) hours as needed for pain.     Marland Kitchen amiodarone (PACERONE) 200 MG tablet Take 0.5 tablets (100 mg total) by mouth daily. 45 tablet 1  . aspirin EC 81 MG tablet Take 81 mg by mouth every morning.     Marland Kitchen atorvastatin (LIPITOR) 80 MG tablet TAKE 1 TABLET EVERY DAY 90 tablet 1  . cetirizine (ZYRTEC) 10 MG tablet Take 10 mg by mouth daily.     . Continuous Blood Gluc Receiver (FREESTYLE LIBRE 14 DAY READER) DEVI 1 application by Does not apply route 3 (three) times daily. 1 Device 5  . Continuous Blood Gluc Sensor (FREESTYLE LIBRE 14 DAY SENSOR) MISC Use to check blood sugar three times daily DX:E11.8 , H54.7 6 each 1  . empagliflozin (JARDIANCE) 10 MG TABS tablet Take 10 mg by mouth daily before supper. 12 tablet 0  . fluticasone (FLONASE) 50 MCG/ACT nasal spray USE 2 SPRAYS IN EACH NOSTRIL EVERY DAY 48 g 0  . furosemide (LASIX) 20 MG tablet Take 20 mg by mouth 3 (three) times daily.    Marland Kitchen gabapentin (NEURONTIN) 100 MG capsule Take 2 tablet ion the morning and 3 tablets at bedtime 360 capsule 1  . lisinopril (ZESTRIL) 20 MG tablet TAKE 1 TABLET (20 MG TOTAL) BY MOUTH DAILY. 90 tablet 1  . loperamide (IMODIUM A-D) 2 MG tablet Take 2 mg by mouth as needed for diarrhea or loose stools.    . metFORMIN (GLUCOPHAGE) 1000  MG tablet TAKE 1 TABLET TWICE DAILY WITH MEALS 180 tablet 0  . Multiple Minerals-Vitamins (CITRACAL MAXIMUM PLUS PO) Take 2 tablets by mouth daily. 650 mg of Calcium per dose. ( Total 1300mg  Daily ) 25 mcg ( 1,000ICU) Vitamin D per dose.  Total 50 mcg ( 2,000 Daily )    . potassium chloride (KLOR-CON) 10 MEQ tablet Take 1 tablet (10 mEq total) by mouth 2 (two) times daily. 50 tablet 0  . Vitamin D, Ergocalciferol, (DRISDOL) 1.25 MG (50000 UNIT) CAPS capsule TAKE 1 CAPSULE (50,000 UNITS TOTAL) BY MOUTH EVERY 7 (SEVEN) DAYS. 12  capsule 1  . warfarin (COUMADIN) 6 MG tablet TAKE 1 TABLET EVERY DAY 90 tablet 3  . amLODipine (NORVASC) 5 MG tablet Take 1 tablet (5 mg total) by mouth daily. 90 tablet 1  . tobramycin (TOBREX) 0.3 % ophthalmic solution Place 1 drop into both eyes See admin instructions. Place 1 drop in both eyes four times daily the day before, day of, and day after eye injections.     No current facility-administered medications on file prior to visit.   Allergies  Allergen Reactions  . Penicillins Anaphylaxis and Other (See Comments)    Has patient had a PCN reaction causing immediate rash, facial/tongue/throat swelling, SOB or lightheadedness with hypotension: Yes Has patient had a PCN reaction causing severe rash involving mucus membranes or skin necrosis: Yes Has patient had a PCN reaction that required hospitalization Yes Has patient had a PCN reaction occurring within the last 10 years: No If all of the above answers are "NO", then may proceed with Cephalosporin use.   . Latex Rash    Redness  . Morphine And Related Nausea Only and Other (See Comments)    Dizziness  . Iodine Rash    Redness   . Tape Rash    Redness     Recent Results (from the past 2160 hour(s))  POCT INR     Status: Abnormal   Collection Time: 05/13/19  9:31 AM  Result Value Ref Range   INR 5.6 (A) 2.0 - 3.0  POCT INR     Status: Abnormal   Collection Time: 05/27/19  8:54 AM  Result Value Ref Range   INR 3.8 (A) 2.0 - 3.0  POCT INR     Status: Normal   Collection Time: 06/11/19  9:19 AM  Result Value Ref Range   INR 2.9 2.0 - 3.0  POCT INR     Status: Abnormal   Collection Time: 07/09/19  9:59 AM  Result Value Ref Range   INR 1.9 (A) 2.0 - 3.0  POCT INR     Status: Abnormal   Collection Time: 08/03/19  9:39 AM  Result Value Ref Range   INR 1.4 (A) 2.0 - 3.0  CBC     Status: Abnormal   Collection Time: 08/04/19 11:40 AM  Result Value Ref Range   WBC 9.0 4.0 - 10.5 K/uL   RBC 3.84 (L) 3.87 - 5.11 MIL/uL    Hemoglobin 10.3 (L) 12.0 - 15.0 g/dL   HCT 34.1 (L) 36.0 - 46.0 %   MCV 88.8 80.0 - 100.0 fL   MCH 26.8 26.0 - 34.0 pg   MCHC 30.2 30.0 - 36.0 g/dL   RDW 15.1 11.5 - 15.5 %   Platelets 491 (H) 150 - 400 K/uL   nRBC 0.0 0.0 - 0.2 %    Comment: Performed at [facility], [address], [place], [place] [address]  Comprehensive metabolic  panel     Status: Abnormal   Collection Time: 08/04/19 11:40 AM  Result Value Ref Range   Sodium 142 135 - 145 mmol/L   Potassium 4.2 3.5 - 5.1 mmol/L   Chloride 101 98 - 111 mmol/L   CO2 29 22 - 32 mmol/L   Glucose, Bld 101 (H) 70 - 99 mg/dL    Comment: Glucose reference range applies only to samples taken after fasting for at least 8 hours.   BUN 41 (H) 8 - 23 mg/dL   Creatinine, Ser 1.34 (H) 0.44 - 1.00 mg/dL   Calcium 9.7 8.9 - 10.3 mg/dL   Total Protein 7.5 6.5 - 8.1 g/dL   Albumin 3.8 3.5 - 5.0 g/dL   AST 25 15 - 41 U/L   ALT 19 0 - 44 U/L   Alkaline Phosphatase 82 38 - 126 U/L   Total Bilirubin 0.3 0.3 - 1.2 mg/dL   GFR calc non Af Amer 38 (L) >60 mL/min   GFR calc Af Amer 44 (L) >60 mL/min   Anion gap 12 5 - 15    Comment: Performed at [facility], [address] [place], [place] [address]    Objective: General: Patient is awake, alert, and oriented x 3 and in no acute distress.  Integument: Skin is warm, dry and supple bilateral. Nails are tender, long, thickened and dystrophic with subungual debris, consistent with onychomycosis, 1-5 bilateral.  No signs of infection. No open lesions or preulcerative lesions present bilateral. Remaining integument unremarkable.  Vasculature:  Dorsalis Pedis pulse 1/4 bilateral. Posterior Tibial pulse  1/4 bilateral. Capillary fill time <3 sec 1-5 bilateral. Positive hair growth to the level of the digits.Temperature gradient within normal limits. Mild varicosities present bilateral. Trace edema present bilateral.   Neurology: The patient has intact sensation measured with a 5.07/10g Semmes Weinstein  Monofilament at all pedal sites bilateral. Vibratory sensation diminished bilateral with tuning fork. No Babinski sign present bilateral.   Musculoskeletal:  Pes planus and minimal hammertoe pedal deformities noted bilateral.  No tenderness to palpation bilateral.  Muscle strength acceptable for patient status.  Assessment and Plan: Problem List Items Addressed This Visit    None    Visit Diagnoses    Pain due to onychomycosis of toenails of both feet    -  Primary   Diabetic polyneuropathy associated with type 2 diabetes mellitus ([place])          -Examined patient. -ABN on file from previous -Re-discussed and educated patient on diabetic foot care, especially with  regards to the vascular, neurological and musculoskeletal systems.  -Mechanically debrided all nails 1-5 bilateral using sterile nail nipper and filed with dremel without incident  -Continue with good supportive shoes daily for foot type -Return to office in 3 months for follow-up nail care  Landis Martins, DPM

## 2019-08-05 ENCOUNTER — Other Ambulatory Visit: Payer: Medicare Other

## 2019-08-06 ENCOUNTER — Telehealth: Payer: Self-pay | Admitting: *Deleted

## 2019-08-06 NOTE — Telephone Encounter (Signed)
-----   Message from Bernita Raisin, RN sent at 08/06/2019  3:18 PM EDT -----  ----- Message ----- From: Arnoldo Lenis, MD Sent: 08/06/2019   3:16 PM EDT To: Bernita Raisin, RN  Labs show just a little bit of stress on the kidneys. Needs to be sure to drink plenty of water, we may adjust her fluid pills when she gets back from her 6 week vacation in [place]. For now continue current meds. If can't reach patient please contact her daughter  Zandra Abts MD

## 2019-08-06 NOTE — Telephone Encounter (Signed)
Pt daughter [name] voiced understanding - has f/u in August - routed to pcp

## 2019-08-10 ENCOUNTER — Ambulatory Visit: Payer: Medicare Other | Admitting: Internal Medicine

## 2019-08-14 ENCOUNTER — Ambulatory Visit: Payer: Medicare Other | Admitting: Neurology

## 2019-08-16 ENCOUNTER — Encounter: Payer: Self-pay | Admitting: Internal Medicine

## 2019-08-16 DIAGNOSIS — E1165 Type 2 diabetes mellitus with hyperglycemia: Secondary | ICD-10-CM | POA: Diagnosis present

## 2019-08-16 DIAGNOSIS — Z8616 Personal history of COVID-19: Secondary | ICD-10-CM | POA: Diagnosis not present

## 2019-08-16 DIAGNOSIS — R05 Cough: Secondary | ICD-10-CM | POA: Diagnosis not present

## 2019-08-16 DIAGNOSIS — E7800 Pure hypercholesterolemia, unspecified: Secondary | ICD-10-CM | POA: Diagnosis present

## 2019-08-16 DIAGNOSIS — J0510 Acute epiglottitis without obstruction: Secondary | ICD-10-CM | POA: Diagnosis not present

## 2019-08-16 DIAGNOSIS — H548 Legal blindness, as defined in USA: Secondary | ICD-10-CM | POA: Diagnosis present

## 2019-08-16 DIAGNOSIS — R0602 Shortness of breath: Secondary | ICD-10-CM | POA: Diagnosis not present

## 2019-08-16 DIAGNOSIS — F458 Other somatoform disorders: Secondary | ICD-10-CM | POA: Diagnosis not present

## 2019-08-16 DIAGNOSIS — Z880 Allergy status to penicillin: Secondary | ICD-10-CM | POA: Diagnosis not present

## 2019-08-16 DIAGNOSIS — M8580 Other specified disorders of bone density and structure, unspecified site: Secondary | ICD-10-CM | POA: Diagnosis present

## 2019-08-16 DIAGNOSIS — I110 Hypertensive heart disease with heart failure: Secondary | ICD-10-CM | POA: Diagnosis present

## 2019-08-16 DIAGNOSIS — I2510 Atherosclerotic heart disease of native coronary artery without angina pectoris: Secondary | ICD-10-CM | POA: Diagnosis not present

## 2019-08-16 DIAGNOSIS — Z8673 Personal history of transient ischemic attack (TIA), and cerebral infarction without residual deficits: Secondary | ICD-10-CM | POA: Diagnosis not present

## 2019-08-16 DIAGNOSIS — R499 Unspecified voice and resonance disorder: Secondary | ICD-10-CM | POA: Diagnosis not present

## 2019-08-16 DIAGNOSIS — M797 Fibromyalgia: Secondary | ICD-10-CM | POA: Diagnosis not present

## 2019-08-16 DIAGNOSIS — Z20822 Contact with and (suspected) exposure to covid-19: Secondary | ICD-10-CM | POA: Diagnosis not present

## 2019-08-16 DIAGNOSIS — I482 Chronic atrial fibrillation, unspecified: Secondary | ICD-10-CM | POA: Diagnosis not present

## 2019-08-16 DIAGNOSIS — J029 Acute pharyngitis, unspecified: Secondary | ICD-10-CM | POA: Diagnosis not present

## 2019-08-16 DIAGNOSIS — Z7982 Long term (current) use of aspirin: Secondary | ICD-10-CM | POA: Diagnosis not present

## 2019-08-16 DIAGNOSIS — Z7901 Long term (current) use of anticoagulants: Secondary | ICD-10-CM | POA: Diagnosis not present

## 2019-08-16 DIAGNOSIS — M1990 Unspecified osteoarthritis, unspecified site: Secondary | ICD-10-CM | POA: Diagnosis present

## 2019-08-16 DIAGNOSIS — H9190 Unspecified hearing loss, unspecified ear: Secondary | ICD-10-CM | POA: Diagnosis present

## 2019-08-16 DIAGNOSIS — Z7984 Long term (current) use of oral hypoglycemic drugs: Secondary | ICD-10-CM | POA: Diagnosis not present

## 2019-08-16 DIAGNOSIS — I509 Heart failure, unspecified: Secondary | ICD-10-CM | POA: Diagnosis present

## 2019-08-16 DIAGNOSIS — K56609 Unspecified intestinal obstruction, unspecified as to partial versus complete obstruction: Secondary | ICD-10-CM | POA: Diagnosis not present

## 2019-08-16 DIAGNOSIS — N3946 Mixed incontinence: Secondary | ICD-10-CM | POA: Diagnosis present

## 2019-08-17 ENCOUNTER — Encounter: Payer: Self-pay | Admitting: Internal Medicine

## 2019-08-17 MED ORDER — GENERIC EXTERNAL MEDICATION
Status: DC
Start: ? — End: 2019-08-17

## 2019-08-17 MED ORDER — DEXTROSE 50 % IV SOLN
12.50 | INTRAVENOUS | Status: DC
Start: ? — End: 2019-08-17

## 2019-08-17 MED ORDER — LISINOPRIL 20 MG PO TABS
20.00 | ORAL_TABLET | ORAL | Status: DC
Start: 2019-08-17 — End: 2019-08-17

## 2019-08-17 MED ORDER — FUROSEMIDE 20 MG PO TABS
20.00 | ORAL_TABLET | ORAL | Status: DC
Start: 2019-08-20 — End: 2019-08-17

## 2019-08-17 MED ORDER — GENERIC EXTERNAL MEDICATION
Status: DC
Start: 2019-08-20 — End: 2019-08-17

## 2019-08-17 MED ORDER — POTASSIUM CHLORIDE ER 10 MEQ PO TBCR
10.00 | EXTENDED_RELEASE_TABLET | ORAL | Status: DC
Start: 2019-08-20 — End: 2019-08-17

## 2019-08-17 MED ORDER — GLUCAGON (RDNA) 1 MG IJ KIT
1.00 | PACK | INTRAMUSCULAR | Status: DC
Start: ? — End: 2019-08-17

## 2019-08-17 MED ORDER — LEVOFLOXACIN IN D5W 250 MG/50ML IV SOLN
250.00 | INTRAVENOUS | Status: DC
Start: 2019-08-17 — End: 2019-08-17

## 2019-08-17 MED ORDER — ATORVASTATIN CALCIUM 40 MG PO TABS
80.00 | ORAL_TABLET | ORAL | Status: DC
Start: 2019-08-19 — End: 2019-08-17

## 2019-08-17 MED ORDER — AMLODIPINE BESYLATE 5 MG PO TABS
5.00 | ORAL_TABLET | ORAL | Status: DC
Start: 2019-08-20 — End: 2019-08-17

## 2019-08-17 MED ORDER — GABAPENTIN 100 MG PO CAPS
200.00 | ORAL_CAPSULE | ORAL | Status: DC
Start: 2019-08-19 — End: 2019-08-17

## 2019-08-17 MED ORDER — CETIRIZINE HCL 10 MG PO TABS
10.00 | ORAL_TABLET | ORAL | Status: DC
Start: 2019-08-20 — End: 2019-08-17

## 2019-08-17 MED ORDER — INSULIN ASPART 100 UNIT/ML FLEXPEN
0.05 | PEN_INJECTOR | SUBCUTANEOUS | Status: DC
Start: 2019-08-19 — End: 2019-08-17

## 2019-08-17 MED ORDER — DEXAMETHASONE SODIUM PHOSPHATE 20 MG/5ML IJ SOLN
4.00 | INTRAMUSCULAR | Status: DC
Start: 2019-08-19 — End: 2019-08-17

## 2019-08-17 MED ORDER — CHOLECALCIFEROL 1.25 MG (50000 UT) PO TABS
50000.00 | ORAL_TABLET | ORAL | Status: DC
Start: 2019-08-23 — End: 2019-08-17

## 2019-08-17 MED ORDER — GLUCOSE 40 % PO GEL
ORAL | Status: DC
Start: ? — End: 2019-08-17

## 2019-08-17 MED ORDER — INSULIN GLARGINE 100 UNIT/ML SOLOSTAR PEN
0.15 | PEN_INJECTOR | SUBCUTANEOUS | Status: DC
Start: 2019-08-19 — End: 2019-08-17

## 2019-08-17 MED ORDER — ASPIRIN 81 MG PO CHEW
81.00 | CHEWABLE_TABLET | ORAL | Status: DC
Start: 2019-08-20 — End: 2019-08-17

## 2019-08-17 MED ORDER — HYDROCODONE-ACETAMINOPHEN 7.5-325 MG/15ML PO SOLN
5.00 | ORAL | Status: DC
Start: ? — End: 2019-08-17

## 2019-08-17 MED ORDER — AMIODARONE HCL 200 MG PO TABS
200.00 | ORAL_TABLET | ORAL | Status: DC
Start: 2019-08-20 — End: 2019-08-17

## 2019-08-17 NOTE — Telephone Encounter (Signed)
Yes, I agree always good to have our next move in mind and begin considering a potential transition. I think very likely she may need to come off amio in the near future and agree watching closely for side effects. Keep [place] updated on any new symptoms   Zandra Abts MD

## 2019-08-20 ENCOUNTER — Encounter: Payer: Self-pay | Admitting: Internal Medicine

## 2019-08-20 MED ORDER — GENERIC EXTERNAL MEDICATION
Status: DC
Start: ? — End: 2019-08-20

## 2019-08-20 MED ORDER — INSULIN ASPART 100 UNIT/ML FLEXPEN
0.00 | PEN_INJECTOR | SUBCUTANEOUS | Status: DC
Start: 2019-08-19 — End: 2019-08-20

## 2019-08-20 MED ORDER — SODIUM CHLORIDE 0.9 % IV SOLN
INTRAVENOUS | Status: DC
Start: ? — End: 2019-08-20

## 2019-08-20 MED ORDER — HYDRALAZINE HCL 25 MG PO TABS
25.00 | ORAL_TABLET | ORAL | Status: DC
Start: ? — End: 2019-08-20

## 2019-08-21 DIAGNOSIS — I5022 Chronic systolic (congestive) heart failure: Secondary | ICD-10-CM | POA: Diagnosis not present

## 2019-08-21 DIAGNOSIS — J060 Acute laryngopharyngitis: Secondary | ICD-10-CM | POA: Diagnosis not present

## 2019-08-21 DIAGNOSIS — I110 Hypertensive heart disease with heart failure: Secondary | ICD-10-CM | POA: Diagnosis not present

## 2019-08-21 DIAGNOSIS — Z7901 Long term (current) use of anticoagulants: Secondary | ICD-10-CM | POA: Diagnosis not present

## 2019-08-21 DIAGNOSIS — E1142 Type 2 diabetes mellitus with diabetic polyneuropathy: Secondary | ICD-10-CM | POA: Diagnosis not present

## 2019-08-21 DIAGNOSIS — H548 Legal blindness, as defined in USA: Secondary | ICD-10-CM | POA: Diagnosis not present

## 2019-08-21 DIAGNOSIS — Z7982 Long term (current) use of aspirin: Secondary | ICD-10-CM | POA: Diagnosis not present

## 2019-08-21 DIAGNOSIS — Z7984 Long term (current) use of oral hypoglycemic drugs: Secondary | ICD-10-CM | POA: Diagnosis not present

## 2019-08-21 DIAGNOSIS — I4891 Unspecified atrial fibrillation: Secondary | ICD-10-CM | POA: Diagnosis not present

## 2019-08-21 DIAGNOSIS — I2510 Atherosclerotic heart disease of native coronary artery without angina pectoris: Secondary | ICD-10-CM | POA: Diagnosis not present

## 2019-08-21 DIAGNOSIS — Z792 Long term (current) use of antibiotics: Secondary | ICD-10-CM | POA: Diagnosis not present

## 2019-08-21 DIAGNOSIS — Z8673 Personal history of transient ischemic attack (TIA), and cerebral infarction without residual deficits: Secondary | ICD-10-CM | POA: Diagnosis not present

## 2019-08-28 ENCOUNTER — Other Ambulatory Visit: Payer: Self-pay | Admitting: Internal Medicine

## 2019-08-28 ENCOUNTER — Other Ambulatory Visit: Payer: Self-pay | Admitting: Family

## 2019-08-28 DIAGNOSIS — E1140 Type 2 diabetes mellitus with diabetic neuropathy, unspecified: Secondary | ICD-10-CM

## 2019-08-31 ENCOUNTER — Telehealth: Payer: Self-pay

## 2019-08-31 ENCOUNTER — Ambulatory Visit: Payer: Self-pay | Admitting: *Deleted

## 2019-08-31 ENCOUNTER — Other Ambulatory Visit: Payer: Self-pay

## 2019-08-31 DIAGNOSIS — J302 Other seasonal allergic rhinitis: Secondary | ICD-10-CM

## 2019-08-31 MED ORDER — GABAPENTIN 100 MG PO CAPS: ORAL_CAPSULE | ORAL | 1 refills | Status: AC

## 2019-08-31 MED ORDER — POTASSIUM CHLORIDE ER 10 MEQ PO TBCR: 10.0000 meq | EXTENDED_RELEASE_TABLET | Freq: Two times a day (BID) | ORAL | 3 refills | Status: DC

## 2019-08-31 MED ORDER — FLUTICASONE PROPIONATE 50 MCG/ACT NA SUSP: 2.0000 | Freq: Every day | NASAL | 0 refills | Status: DC

## 2019-08-31 NOTE — Telephone Encounter (Signed)
Refill request received for Jeanette Yates

## 2019-08-31 NOTE — Telephone Encounter (Signed)
Refill request received from [place] [facility]

## 2019-09-01 ENCOUNTER — Telehealth: Payer: Self-pay | Admitting: *Deleted

## 2019-09-01 ENCOUNTER — Ambulatory Visit (INDEPENDENT_AMBULATORY_CARE_PROVIDER_SITE_OTHER): Payer: Medicare Other | Admitting: *Deleted

## 2019-09-01 DIAGNOSIS — Z5181 Encounter for therapeutic drug level monitoring: Secondary | ICD-10-CM | POA: Diagnosis not present

## 2019-09-01 DIAGNOSIS — Z8673 Personal history of transient ischemic attack (TIA), and cerebral infarction without residual deficits: Secondary | ICD-10-CM | POA: Diagnosis not present

## 2019-09-01 DIAGNOSIS — I4891 Unspecified atrial fibrillation: Secondary | ICD-10-CM

## 2019-09-01 DIAGNOSIS — Z952 Presence of prosthetic heart valve: Secondary | ICD-10-CM | POA: Diagnosis not present

## 2019-09-01 LAB — POCT INR: INR: 3.5 — AB (ref 2.0–3.0)

## 2019-09-01 NOTE — Telephone Encounter (Signed)
INR - 3.5 PT     42.5  Please call Derrill Kay with [facility] [phone].

## 2019-09-01 NOTE — Telephone Encounter (Signed)
Spoke with Lilia Pro. Warfarin instructions given.  See coumadin note.

## 2019-09-01 NOTE — Patient Instructions (Signed)
Spoke with Derrill Kay RN with [facility] [phone] Pt to hold warfarin tonight then decrease dose to 1/2 tablet daily except 1 tablet on M,W,F. Recheck in 1 wk by Home health

## 2019-09-08 ENCOUNTER — Ambulatory Visit (INDEPENDENT_AMBULATORY_CARE_PROVIDER_SITE_OTHER): Payer: Medicare Other | Admitting: *Deleted

## 2019-09-08 DIAGNOSIS — Z5181 Encounter for therapeutic drug level monitoring: Secondary | ICD-10-CM

## 2019-09-08 DIAGNOSIS — I4891 Unspecified atrial fibrillation: Secondary | ICD-10-CM

## 2019-09-08 LAB — POCT INR: INR: 2.4 (ref 2.0–3.0)

## 2019-09-08 NOTE — Patient Instructions (Signed)
Spoke with Karn Pickler RN with [facility] [phone] Continue warfarin 1/2 tablet daily except 1 tablet on M,W,F. Recheck on 09/21/19 by Home health Coming back home on 09/23/19

## 2019-09-10 ENCOUNTER — Other Ambulatory Visit: Payer: Self-pay

## 2019-09-10 MED ORDER — POTASSIUM CHLORIDE ER 10 MEQ PO TBCR: 10.0000 meq | EXTENDED_RELEASE_TABLET | Freq: Two times a day (BID) | ORAL | 3 refills | Status: DC

## 2019-09-10 NOTE — Telephone Encounter (Signed)
refilled potassium

## 2019-09-14 ENCOUNTER — Encounter: Payer: Self-pay | Admitting: Internal Medicine

## 2019-09-18 ENCOUNTER — Other Ambulatory Visit: Payer: Self-pay | Admitting: Internal Medicine

## 2019-09-18 ENCOUNTER — Telehealth: Payer: Self-pay

## 2019-09-18 DIAGNOSIS — J302 Other seasonal allergic rhinitis: Secondary | ICD-10-CM

## 2019-09-18 NOTE — Telephone Encounter (Signed)
Patient's daughter called and cancelled all of the patient's appointments.  She went to [place] to visit and decided she was not returning. Daughter stated we would soon be getting requests for transferring medical records.

## 2019-09-22 ENCOUNTER — Telehealth: Payer: Self-pay

## 2019-09-22 DIAGNOSIS — E1140 Type 2 diabetes mellitus with diabetic neuropathy, unspecified: Secondary | ICD-10-CM

## 2019-09-22 DIAGNOSIS — H3530 Unspecified macular degeneration: Secondary | ICD-10-CM

## 2019-09-22 DIAGNOSIS — H547 Unspecified visual loss: Secondary | ICD-10-CM

## 2019-09-22 MED ORDER — FREESTYLE LIBRE 14 DAY READER DEVI: 1.0000 "application " | Freq: Three times a day (TID) | 0 refills | Status: DC

## 2019-09-22 MED ORDER — FREESTYLE LIBRE 14 DAY SENSOR MISC: 1 refills | Status: DC

## 2019-09-22 NOTE — Telephone Encounter (Signed)
Patient has move to [facility] and need refill on [employer]..... rx sent to pharmacy by e-script

## 2019-09-24 NOTE — Telephone Encounter (Signed)
Noted.  I wish them well.

## 2019-09-26 ENCOUNTER — Telehealth: Payer: Self-pay | Admitting: Internal Medicine

## 2019-09-26 NOTE — Telephone Encounter (Signed)
Cardiology [facility] Note  Returned page from patient. She is in [place] right now. States that she has all of her medications except her amlodipine. Requesting a 2 week refill. On review of her chart, it is unclear as to whether she should be taking this or not. This was discontinued a few months ago due to hypotension and it appears to have been restarted, however the patient doesn't know whether she has actually been taking it or not. Her last refill should have run out in May. She has a caretaker who helps with her meds but this person isn't available right now. The patient is asymptomatic. She does not check her blood pressure regularly.   I recommended that the patient check her blood pressure daily over the next few days. Will send a message to Dr Harl Bowie requesting that he reach out to the patient by phone sometime this week to discuss whether she should restart this medication. Patient agrees with this plan.   Marcie Mowers, MD Cardiology Fellow, PGY-8

## 2019-09-29 MED ORDER — AMLODIPINE BESYLATE 5 MG PO TABS: 5.0000 mg | ORAL_TABLET | Freq: Every day | ORAL | 1 refills | Status: DC

## 2019-09-29 MED ORDER — AMLODIPINE BESYLATE 5 MG PO TABS: 5.0000 mg | ORAL_TABLET | Freq: Every day | ORAL | 1 refills | Status: AC

## 2019-09-29 NOTE — Telephone Encounter (Signed)
[facility] in [place], [place]

## 2019-09-29 NOTE — Telephone Encounter (Signed)
Pt returned call and will send to [facility]

## 2019-09-29 NOTE — Telephone Encounter (Signed)
Please touch base with patient, should be on norvasc 5mg  daily. Can refill if she needs it to her [place]    Zandra Abts MD

## 2019-09-29 NOTE — Addendum Note (Signed)
Addended by: Julian Hy T on: 09/29/2019 01:50 PM   Modules accepted: Orders

## 2019-09-30 ENCOUNTER — Other Ambulatory Visit: Payer: Medicare Other

## 2019-10-05 ENCOUNTER — Ambulatory Visit: Payer: Medicare Other | Admitting: Internal Medicine

## 2019-10-13 ENCOUNTER — Other Ambulatory Visit: Payer: Self-pay

## 2019-10-13 DIAGNOSIS — E1140 Type 2 diabetes mellitus with diabetic neuropathy, unspecified: Secondary | ICD-10-CM

## 2019-10-13 MED ORDER — EMPAGLIFLOZIN 10 MG PO TABS
10.0000 mg | ORAL_TABLET | Freq: Every day | ORAL | 1 refills | Status: AC
Start: 2019-10-13 — End: ?

## 2019-10-21 DIAGNOSIS — H3530 Unspecified macular degeneration: Secondary | ICD-10-CM | POA: Diagnosis not present

## 2019-10-21 DIAGNOSIS — E1159 Type 2 diabetes mellitus with other circulatory complications: Secondary | ICD-10-CM | POA: Diagnosis not present

## 2019-10-21 DIAGNOSIS — I509 Heart failure, unspecified: Secondary | ICD-10-CM | POA: Diagnosis not present

## 2019-10-21 DIAGNOSIS — I2510 Atherosclerotic heart disease of native coronary artery without angina pectoris: Secondary | ICD-10-CM | POA: Diagnosis not present

## 2019-10-21 DIAGNOSIS — R1310 Dysphagia, unspecified: Secondary | ICD-10-CM | POA: Diagnosis not present

## 2019-10-21 DIAGNOSIS — M8580 Other specified disorders of bone density and structure, unspecified site: Secondary | ICD-10-CM | POA: Diagnosis not present

## 2019-10-21 DIAGNOSIS — I480 Paroxysmal atrial fibrillation: Secondary | ICD-10-CM | POA: Diagnosis not present

## 2019-10-21 DIAGNOSIS — Z0000 Encounter for general adult medical examination without abnormal findings: Secondary | ICD-10-CM | POA: Diagnosis not present

## 2019-10-23 DIAGNOSIS — H548 Legal blindness, as defined in USA: Secondary | ICD-10-CM | POA: Diagnosis not present

## 2019-10-23 DIAGNOSIS — I4891 Unspecified atrial fibrillation: Secondary | ICD-10-CM | POA: Diagnosis not present

## 2019-10-23 DIAGNOSIS — Z9181 History of falling: Secondary | ICD-10-CM | POA: Diagnosis not present

## 2019-10-23 DIAGNOSIS — I2510 Atherosclerotic heart disease of native coronary artery without angina pectoris: Secondary | ICD-10-CM | POA: Diagnosis not present

## 2019-10-23 DIAGNOSIS — I509 Heart failure, unspecified: Secondary | ICD-10-CM | POA: Diagnosis not present

## 2019-10-23 DIAGNOSIS — Z7984 Long term (current) use of oral hypoglycemic drugs: Secondary | ICD-10-CM | POA: Diagnosis not present

## 2019-10-23 DIAGNOSIS — E119 Type 2 diabetes mellitus without complications: Secondary | ICD-10-CM | POA: Diagnosis not present

## 2019-10-27 DIAGNOSIS — Z20822 Contact with and (suspected) exposure to covid-19: Secondary | ICD-10-CM | POA: Diagnosis not present

## 2019-10-28 DIAGNOSIS — I4891 Unspecified atrial fibrillation: Secondary | ICD-10-CM | POA: Diagnosis not present

## 2019-10-28 DIAGNOSIS — Z7984 Long term (current) use of oral hypoglycemic drugs: Secondary | ICD-10-CM | POA: Diagnosis not present

## 2019-10-28 DIAGNOSIS — I2510 Atherosclerotic heart disease of native coronary artery without angina pectoris: Secondary | ICD-10-CM | POA: Diagnosis not present

## 2019-10-28 DIAGNOSIS — H548 Legal blindness, as defined in USA: Secondary | ICD-10-CM | POA: Diagnosis not present

## 2019-10-28 DIAGNOSIS — I509 Heart failure, unspecified: Secondary | ICD-10-CM | POA: Diagnosis not present

## 2019-10-28 DIAGNOSIS — E119 Type 2 diabetes mellitus without complications: Secondary | ICD-10-CM | POA: Diagnosis not present

## 2019-10-29 DIAGNOSIS — E1159 Type 2 diabetes mellitus with other circulatory complications: Secondary | ICD-10-CM | POA: Diagnosis not present

## 2019-10-29 DIAGNOSIS — I509 Heart failure, unspecified: Secondary | ICD-10-CM | POA: Diagnosis not present

## 2019-10-29 DIAGNOSIS — I480 Paroxysmal atrial fibrillation: Secondary | ICD-10-CM | POA: Diagnosis not present

## 2019-10-29 DIAGNOSIS — M8580 Other specified disorders of bone density and structure, unspecified site: Secondary | ICD-10-CM | POA: Diagnosis not present

## 2019-10-29 DIAGNOSIS — I2510 Atherosclerotic heart disease of native coronary artery without angina pectoris: Secondary | ICD-10-CM | POA: Diagnosis not present

## 2019-10-30 DIAGNOSIS — M62838 Other muscle spasm: Secondary | ICD-10-CM | POA: Diagnosis not present

## 2019-11-01 DIAGNOSIS — H3530 Unspecified macular degeneration: Secondary | ICD-10-CM | POA: Diagnosis present

## 2019-11-01 DIAGNOSIS — R4182 Altered mental status, unspecified: Secondary | ICD-10-CM | POA: Diagnosis not present

## 2019-11-01 DIAGNOSIS — I639 Cerebral infarction, unspecified: Secondary | ICD-10-CM | POA: Diagnosis not present

## 2019-11-01 DIAGNOSIS — I4819 Other persistent atrial fibrillation: Secondary | ICD-10-CM | POA: Diagnosis not present

## 2019-11-01 DIAGNOSIS — Z953 Presence of xenogenic heart valve: Secondary | ICD-10-CM | POA: Diagnosis not present

## 2019-11-01 DIAGNOSIS — H548 Legal blindness, as defined in USA: Secondary | ICD-10-CM | POA: Diagnosis present

## 2019-11-01 DIAGNOSIS — E785 Hyperlipidemia, unspecified: Secondary | ICD-10-CM | POA: Diagnosis present

## 2019-11-01 DIAGNOSIS — I447 Left bundle-branch block, unspecified: Secondary | ICD-10-CM | POA: Diagnosis present

## 2019-11-01 DIAGNOSIS — R918 Other nonspecific abnormal finding of lung field: Secondary | ICD-10-CM | POA: Diagnosis not present

## 2019-11-01 DIAGNOSIS — E538 Deficiency of other specified B group vitamins: Secondary | ICD-10-CM | POA: Diagnosis not present

## 2019-11-01 DIAGNOSIS — I6523 Occlusion and stenosis of bilateral carotid arteries: Secondary | ICD-10-CM | POA: Diagnosis present

## 2019-11-01 DIAGNOSIS — I350 Nonrheumatic aortic (valve) stenosis: Secondary | ICD-10-CM | POA: Diagnosis not present

## 2019-11-01 DIAGNOSIS — Z951 Presence of aortocoronary bypass graft: Secondary | ICD-10-CM | POA: Diagnosis not present

## 2019-11-01 DIAGNOSIS — R569 Unspecified convulsions: Secondary | ICD-10-CM | POA: Diagnosis not present

## 2019-11-01 DIAGNOSIS — Z8673 Personal history of transient ischemic attack (TIA), and cerebral infarction without residual deficits: Secondary | ICD-10-CM | POA: Diagnosis not present

## 2019-11-01 DIAGNOSIS — R0902 Hypoxemia: Secondary | ICD-10-CM | POA: Diagnosis present

## 2019-11-01 DIAGNOSIS — Z87891 Personal history of nicotine dependence: Secondary | ICD-10-CM | POA: Diagnosis not present

## 2019-11-01 DIAGNOSIS — Z7982 Long term (current) use of aspirin: Secondary | ICD-10-CM | POA: Diagnosis not present

## 2019-11-01 DIAGNOSIS — R29709 NIHSS score 9: Secondary | ICD-10-CM | POA: Diagnosis not present

## 2019-11-01 DIAGNOSIS — I2510 Atherosclerotic heart disease of native coronary artery without angina pectoris: Secondary | ICD-10-CM | POA: Diagnosis not present

## 2019-11-01 DIAGNOSIS — I5032 Chronic diastolic (congestive) heart failure: Secondary | ICD-10-CM | POA: Diagnosis not present

## 2019-11-01 DIAGNOSIS — R791 Abnormal coagulation profile: Secondary | ICD-10-CM | POA: Diagnosis present

## 2019-11-01 DIAGNOSIS — Z7901 Long term (current) use of anticoagulants: Secondary | ICD-10-CM | POA: Diagnosis not present

## 2019-11-01 DIAGNOSIS — E1142 Type 2 diabetes mellitus with diabetic polyneuropathy: Secondary | ICD-10-CM | POA: Diagnosis present

## 2019-11-01 DIAGNOSIS — J309 Allergic rhinitis, unspecified: Secondary | ICD-10-CM | POA: Diagnosis present

## 2019-11-01 DIAGNOSIS — Z9989 Dependence on other enabling machines and devices: Secondary | ICD-10-CM | POA: Diagnosis not present

## 2019-11-01 DIAGNOSIS — G40409 Other generalized epilepsy and epileptic syndromes, not intractable, without status epilepticus: Secondary | ICD-10-CM | POA: Diagnosis not present

## 2019-11-01 DIAGNOSIS — I509 Heart failure, unspecified: Secondary | ICD-10-CM | POA: Diagnosis present

## 2019-11-01 DIAGNOSIS — G9340 Encephalopathy, unspecified: Secondary | ICD-10-CM | POA: Diagnosis not present

## 2019-11-01 DIAGNOSIS — I110 Hypertensive heart disease with heart failure: Secondary | ICD-10-CM | POA: Diagnosis present

## 2019-11-01 DIAGNOSIS — I4891 Unspecified atrial fibrillation: Secondary | ICD-10-CM | POA: Diagnosis not present

## 2019-11-01 DIAGNOSIS — N3000 Acute cystitis without hematuria: Secondary | ICD-10-CM | POA: Diagnosis not present

## 2019-11-01 DIAGNOSIS — E876 Hypokalemia: Secondary | ICD-10-CM | POA: Diagnosis not present

## 2019-11-01 DIAGNOSIS — I10 Essential (primary) hypertension: Secondary | ICD-10-CM | POA: Diagnosis not present

## 2019-11-01 DIAGNOSIS — N179 Acute kidney failure, unspecified: Secondary | ICD-10-CM | POA: Diagnosis not present

## 2019-11-01 DIAGNOSIS — R55 Syncope and collapse: Secondary | ICD-10-CM | POA: Diagnosis not present

## 2019-11-01 DIAGNOSIS — Z743 Need for continuous supervision: Secondary | ICD-10-CM | POA: Diagnosis not present

## 2019-11-01 DIAGNOSIS — I6522 Occlusion and stenosis of left carotid artery: Secondary | ICD-10-CM | POA: Diagnosis not present

## 2019-11-01 DIAGNOSIS — I6930 Unspecified sequelae of cerebral infarction: Secondary | ICD-10-CM | POA: Diagnosis not present

## 2019-11-01 DIAGNOSIS — R42 Dizziness and giddiness: Secondary | ICD-10-CM | POA: Diagnosis not present

## 2019-11-04 ENCOUNTER — Other Ambulatory Visit: Payer: Self-pay | Admitting: Internal Medicine

## 2019-11-04 DIAGNOSIS — I10 Essential (primary) hypertension: Secondary | ICD-10-CM

## 2019-11-04 NOTE — Telephone Encounter (Signed)
rx sent to pharmacy by e-script  

## 2019-11-06 DIAGNOSIS — H548 Legal blindness, as defined in USA: Secondary | ICD-10-CM | POA: Diagnosis not present

## 2019-11-06 DIAGNOSIS — E119 Type 2 diabetes mellitus without complications: Secondary | ICD-10-CM | POA: Diagnosis not present

## 2019-11-06 DIAGNOSIS — I509 Heart failure, unspecified: Secondary | ICD-10-CM | POA: Diagnosis not present

## 2019-11-06 DIAGNOSIS — R569 Unspecified convulsions: Secondary | ICD-10-CM | POA: Diagnosis not present

## 2019-11-06 DIAGNOSIS — D473 Essential (hemorrhagic) thrombocythemia: Secondary | ICD-10-CM | POA: Diagnosis not present

## 2019-11-06 DIAGNOSIS — G9341 Metabolic encephalopathy: Secondary | ICD-10-CM | POA: Diagnosis not present

## 2019-11-06 DIAGNOSIS — Z7984 Long term (current) use of oral hypoglycemic drugs: Secondary | ICD-10-CM | POA: Diagnosis not present

## 2019-11-06 DIAGNOSIS — I2510 Atherosclerotic heart disease of native coronary artery without angina pectoris: Secondary | ICD-10-CM | POA: Diagnosis not present

## 2019-11-06 DIAGNOSIS — I4891 Unspecified atrial fibrillation: Secondary | ICD-10-CM | POA: Diagnosis not present

## 2019-11-08 ENCOUNTER — Other Ambulatory Visit: Payer: Self-pay | Admitting: Internal Medicine

## 2019-11-08 ENCOUNTER — Other Ambulatory Visit: Payer: Self-pay | Admitting: Cardiology

## 2019-11-08 DIAGNOSIS — E1140 Type 2 diabetes mellitus with diabetic neuropathy, unspecified: Secondary | ICD-10-CM

## 2019-11-08 DIAGNOSIS — M8580 Other specified disorders of bone density and structure, unspecified site: Secondary | ICD-10-CM

## 2019-11-09 NOTE — Telephone Encounter (Signed)
rx sent to pharmacy by e-script  

## 2019-11-10 DIAGNOSIS — E119 Type 2 diabetes mellitus without complications: Secondary | ICD-10-CM | POA: Diagnosis not present

## 2019-11-10 DIAGNOSIS — H548 Legal blindness, as defined in USA: Secondary | ICD-10-CM | POA: Diagnosis not present

## 2019-11-10 DIAGNOSIS — I509 Heart failure, unspecified: Secondary | ICD-10-CM | POA: Diagnosis not present

## 2019-11-10 DIAGNOSIS — I4891 Unspecified atrial fibrillation: Secondary | ICD-10-CM | POA: Diagnosis not present

## 2019-11-10 DIAGNOSIS — I2510 Atherosclerotic heart disease of native coronary artery without angina pectoris: Secondary | ICD-10-CM | POA: Diagnosis not present

## 2019-11-10 DIAGNOSIS — Z7984 Long term (current) use of oral hypoglycemic drugs: Secondary | ICD-10-CM | POA: Diagnosis not present

## 2019-11-12 DIAGNOSIS — D473 Essential (hemorrhagic) thrombocythemia: Secondary | ICD-10-CM | POA: Diagnosis not present

## 2019-11-12 DIAGNOSIS — M25512 Pain in left shoulder: Secondary | ICD-10-CM | POA: Diagnosis not present

## 2019-11-12 DIAGNOSIS — R7989 Other specified abnormal findings of blood chemistry: Secondary | ICD-10-CM | POA: Diagnosis not present

## 2019-11-12 DIAGNOSIS — D649 Anemia, unspecified: Secondary | ICD-10-CM | POA: Diagnosis not present

## 2019-11-13 DIAGNOSIS — Z7984 Long term (current) use of oral hypoglycemic drugs: Secondary | ICD-10-CM | POA: Diagnosis not present

## 2019-11-13 DIAGNOSIS — I509 Heart failure, unspecified: Secondary | ICD-10-CM | POA: Diagnosis not present

## 2019-11-13 DIAGNOSIS — H548 Legal blindness, as defined in USA: Secondary | ICD-10-CM | POA: Diagnosis not present

## 2019-11-13 DIAGNOSIS — E119 Type 2 diabetes mellitus without complications: Secondary | ICD-10-CM | POA: Diagnosis not present

## 2019-11-13 DIAGNOSIS — I4891 Unspecified atrial fibrillation: Secondary | ICD-10-CM | POA: Diagnosis not present

## 2019-11-13 DIAGNOSIS — I2510 Atherosclerotic heart disease of native coronary artery without angina pectoris: Secondary | ICD-10-CM | POA: Diagnosis not present

## 2019-11-14 DIAGNOSIS — E119 Type 2 diabetes mellitus without complications: Secondary | ICD-10-CM | POA: Diagnosis not present

## 2019-11-14 DIAGNOSIS — I4891 Unspecified atrial fibrillation: Secondary | ICD-10-CM | POA: Diagnosis not present

## 2019-11-14 DIAGNOSIS — I2510 Atherosclerotic heart disease of native coronary artery without angina pectoris: Secondary | ICD-10-CM | POA: Diagnosis not present

## 2019-11-14 DIAGNOSIS — Z7984 Long term (current) use of oral hypoglycemic drugs: Secondary | ICD-10-CM | POA: Diagnosis not present

## 2019-11-14 DIAGNOSIS — H548 Legal blindness, as defined in USA: Secondary | ICD-10-CM | POA: Diagnosis not present

## 2019-11-14 DIAGNOSIS — I509 Heart failure, unspecified: Secondary | ICD-10-CM | POA: Diagnosis not present

## 2019-11-18 DIAGNOSIS — H548 Legal blindness, as defined in USA: Secondary | ICD-10-CM | POA: Diagnosis not present

## 2019-11-18 DIAGNOSIS — Z7984 Long term (current) use of oral hypoglycemic drugs: Secondary | ICD-10-CM | POA: Diagnosis not present

## 2019-11-18 DIAGNOSIS — E119 Type 2 diabetes mellitus without complications: Secondary | ICD-10-CM | POA: Diagnosis not present

## 2019-11-18 DIAGNOSIS — I2510 Atherosclerotic heart disease of native coronary artery without angina pectoris: Secondary | ICD-10-CM | POA: Diagnosis not present

## 2019-11-18 DIAGNOSIS — I509 Heart failure, unspecified: Secondary | ICD-10-CM | POA: Diagnosis not present

## 2019-11-18 DIAGNOSIS — I4891 Unspecified atrial fibrillation: Secondary | ICD-10-CM | POA: Diagnosis not present

## 2019-11-20 DIAGNOSIS — Z7984 Long term (current) use of oral hypoglycemic drugs: Secondary | ICD-10-CM | POA: Diagnosis not present

## 2019-11-20 DIAGNOSIS — E119 Type 2 diabetes mellitus without complications: Secondary | ICD-10-CM | POA: Diagnosis not present

## 2019-11-20 DIAGNOSIS — I4891 Unspecified atrial fibrillation: Secondary | ICD-10-CM | POA: Diagnosis not present

## 2019-11-20 DIAGNOSIS — I2510 Atherosclerotic heart disease of native coronary artery without angina pectoris: Secondary | ICD-10-CM | POA: Diagnosis not present

## 2019-11-20 DIAGNOSIS — I509 Heart failure, unspecified: Secondary | ICD-10-CM | POA: Diagnosis not present

## 2019-11-20 DIAGNOSIS — H548 Legal blindness, as defined in USA: Secondary | ICD-10-CM | POA: Diagnosis not present

## 2019-11-22 DIAGNOSIS — M797 Fibromyalgia: Secondary | ICD-10-CM | POA: Diagnosis not present

## 2019-11-22 DIAGNOSIS — I69391 Dysphagia following cerebral infarction: Secondary | ICD-10-CM | POA: Diagnosis not present

## 2019-11-22 DIAGNOSIS — I509 Heart failure, unspecified: Secondary | ICD-10-CM | POA: Diagnosis not present

## 2019-11-22 DIAGNOSIS — E785 Hyperlipidemia, unspecified: Secondary | ICD-10-CM | POA: Diagnosis not present

## 2019-11-22 DIAGNOSIS — H3530 Unspecified macular degeneration: Secondary | ICD-10-CM | POA: Diagnosis not present

## 2019-11-22 DIAGNOSIS — M810 Age-related osteoporosis without current pathological fracture: Secondary | ICD-10-CM | POA: Diagnosis not present

## 2019-11-22 DIAGNOSIS — I2510 Atherosclerotic heart disease of native coronary artery without angina pectoris: Secondary | ICD-10-CM | POA: Diagnosis not present

## 2019-11-22 DIAGNOSIS — R1310 Dysphagia, unspecified: Secondary | ICD-10-CM | POA: Diagnosis not present

## 2019-11-22 DIAGNOSIS — K589 Irritable bowel syndrome without diarrhea: Secondary | ICD-10-CM | POA: Diagnosis not present

## 2019-11-22 DIAGNOSIS — F329 Major depressive disorder, single episode, unspecified: Secondary | ICD-10-CM | POA: Diagnosis not present

## 2019-11-22 DIAGNOSIS — Z7984 Long term (current) use of oral hypoglycemic drugs: Secondary | ICD-10-CM | POA: Diagnosis not present

## 2019-11-22 DIAGNOSIS — Z9181 History of falling: Secondary | ICD-10-CM | POA: Diagnosis not present

## 2019-11-22 DIAGNOSIS — I480 Paroxysmal atrial fibrillation: Secondary | ICD-10-CM | POA: Diagnosis not present

## 2019-11-22 DIAGNOSIS — M1991 Primary osteoarthritis, unspecified site: Secondary | ICD-10-CM | POA: Diagnosis not present

## 2019-11-22 DIAGNOSIS — H9190 Unspecified hearing loss, unspecified ear: Secondary | ICD-10-CM | POA: Diagnosis not present

## 2019-11-22 DIAGNOSIS — H548 Legal blindness, as defined in USA: Secondary | ICD-10-CM | POA: Diagnosis not present

## 2019-11-22 DIAGNOSIS — I110 Hypertensive heart disease with heart failure: Secondary | ICD-10-CM | POA: Diagnosis not present

## 2019-11-22 DIAGNOSIS — E1142 Type 2 diabetes mellitus with diabetic polyneuropathy: Secondary | ICD-10-CM | POA: Diagnosis not present

## 2019-11-22 DIAGNOSIS — Z87891 Personal history of nicotine dependence: Secondary | ICD-10-CM | POA: Diagnosis not present

## 2019-11-23 ENCOUNTER — Ambulatory Visit: Payer: Medicare Other | Admitting: Physician Assistant

## 2019-11-23 ENCOUNTER — Ambulatory Visit: Payer: Medicare Other | Admitting: Neurology

## 2019-11-24 DIAGNOSIS — I509 Heart failure, unspecified: Secondary | ICD-10-CM | POA: Diagnosis not present

## 2019-11-24 DIAGNOSIS — Z136 Encounter for screening for cardiovascular disorders: Secondary | ICD-10-CM | POA: Diagnosis not present

## 2019-11-24 DIAGNOSIS — D649 Anemia, unspecified: Secondary | ICD-10-CM | POA: Diagnosis not present

## 2019-11-24 DIAGNOSIS — Z952 Presence of prosthetic heart valve: Secondary | ICD-10-CM | POA: Diagnosis not present

## 2019-11-24 DIAGNOSIS — I25119 Atherosclerotic heart disease of native coronary artery with unspecified angina pectoris: Secondary | ICD-10-CM | POA: Diagnosis not present

## 2019-11-24 DIAGNOSIS — I4891 Unspecified atrial fibrillation: Secondary | ICD-10-CM | POA: Diagnosis not present

## 2019-11-24 DIAGNOSIS — N1831 Chronic kidney disease, stage 3a: Secondary | ICD-10-CM | POA: Diagnosis not present

## 2019-11-24 DIAGNOSIS — R002 Palpitations: Secondary | ICD-10-CM | POA: Diagnosis not present

## 2019-11-24 DIAGNOSIS — I480 Paroxysmal atrial fibrillation: Secondary | ICD-10-CM | POA: Diagnosis not present

## 2019-11-24 DIAGNOSIS — R0989 Other specified symptoms and signs involving the circulatory and respiratory systems: Secondary | ICD-10-CM | POA: Diagnosis not present

## 2019-11-24 DIAGNOSIS — I25709 Atherosclerosis of coronary artery bypass graft(s), unspecified, with unspecified angina pectoris: Secondary | ICD-10-CM | POA: Diagnosis not present

## 2019-11-24 DIAGNOSIS — I209 Angina pectoris, unspecified: Secondary | ICD-10-CM | POA: Diagnosis not present

## 2019-11-24 DIAGNOSIS — R079 Chest pain, unspecified: Secondary | ICD-10-CM | POA: Diagnosis not present

## 2019-11-24 DIAGNOSIS — R0602 Shortness of breath: Secondary | ICD-10-CM | POA: Diagnosis not present

## 2019-11-24 DIAGNOSIS — E119 Type 2 diabetes mellitus without complications: Secondary | ICD-10-CM | POA: Diagnosis not present

## 2019-11-24 DIAGNOSIS — R011 Cardiac murmur, unspecified: Secondary | ICD-10-CM | POA: Diagnosis not present

## 2019-11-24 DIAGNOSIS — R7989 Other specified abnormal findings of blood chemistry: Secondary | ICD-10-CM | POA: Diagnosis not present

## 2019-11-24 DIAGNOSIS — D539 Nutritional anemia, unspecified: Secondary | ICD-10-CM | POA: Diagnosis not present

## 2019-11-25 DIAGNOSIS — E1142 Type 2 diabetes mellitus with diabetic polyneuropathy: Secondary | ICD-10-CM | POA: Diagnosis not present

## 2019-11-25 DIAGNOSIS — I110 Hypertensive heart disease with heart failure: Secondary | ICD-10-CM | POA: Diagnosis not present

## 2019-11-25 DIAGNOSIS — I509 Heart failure, unspecified: Secondary | ICD-10-CM | POA: Diagnosis not present

## 2019-11-25 DIAGNOSIS — M797 Fibromyalgia: Secondary | ICD-10-CM | POA: Diagnosis not present

## 2019-11-25 DIAGNOSIS — I2510 Atherosclerotic heart disease of native coronary artery without angina pectoris: Secondary | ICD-10-CM | POA: Diagnosis not present

## 2019-11-25 DIAGNOSIS — I480 Paroxysmal atrial fibrillation: Secondary | ICD-10-CM | POA: Diagnosis not present

## 2019-11-26 ENCOUNTER — Ambulatory Visit: Payer: Medicare Other | Admitting: Sports Medicine

## 2019-11-26 DIAGNOSIS — M25512 Pain in left shoulder: Secondary | ICD-10-CM | POA: Diagnosis not present

## 2019-11-26 DIAGNOSIS — Z2821 Immunization not carried out because of patient refusal: Secondary | ICD-10-CM | POA: Diagnosis not present

## 2019-11-26 DIAGNOSIS — I480 Paroxysmal atrial fibrillation: Secondary | ICD-10-CM | POA: Diagnosis not present

## 2019-11-27 DIAGNOSIS — I2510 Atherosclerotic heart disease of native coronary artery without angina pectoris: Secondary | ICD-10-CM | POA: Diagnosis not present

## 2019-11-27 DIAGNOSIS — I110 Hypertensive heart disease with heart failure: Secondary | ICD-10-CM | POA: Diagnosis not present

## 2019-11-27 DIAGNOSIS — M797 Fibromyalgia: Secondary | ICD-10-CM | POA: Diagnosis not present

## 2019-11-27 DIAGNOSIS — I509 Heart failure, unspecified: Secondary | ICD-10-CM | POA: Diagnosis not present

## 2019-11-27 DIAGNOSIS — E1142 Type 2 diabetes mellitus with diabetic polyneuropathy: Secondary | ICD-10-CM | POA: Diagnosis not present

## 2019-11-27 DIAGNOSIS — I480 Paroxysmal atrial fibrillation: Secondary | ICD-10-CM | POA: Diagnosis not present

## 2019-12-01 DIAGNOSIS — M25512 Pain in left shoulder: Secondary | ICD-10-CM | POA: Diagnosis not present

## 2019-12-01 DIAGNOSIS — I509 Heart failure, unspecified: Secondary | ICD-10-CM | POA: Diagnosis not present

## 2019-12-01 DIAGNOSIS — E1142 Type 2 diabetes mellitus with diabetic polyneuropathy: Secondary | ICD-10-CM | POA: Diagnosis not present

## 2019-12-01 DIAGNOSIS — M797 Fibromyalgia: Secondary | ICD-10-CM | POA: Diagnosis not present

## 2019-12-01 DIAGNOSIS — I480 Paroxysmal atrial fibrillation: Secondary | ICD-10-CM | POA: Diagnosis not present

## 2019-12-01 DIAGNOSIS — I2510 Atherosclerotic heart disease of native coronary artery without angina pectoris: Secondary | ICD-10-CM | POA: Diagnosis not present

## 2019-12-01 DIAGNOSIS — I110 Hypertensive heart disease with heart failure: Secondary | ICD-10-CM | POA: Diagnosis not present

## 2019-12-01 DIAGNOSIS — M542 Cervicalgia: Secondary | ICD-10-CM | POA: Diagnosis not present

## 2019-12-03 DIAGNOSIS — I110 Hypertensive heart disease with heart failure: Secondary | ICD-10-CM | POA: Diagnosis not present

## 2019-12-03 DIAGNOSIS — I2510 Atherosclerotic heart disease of native coronary artery without angina pectoris: Secondary | ICD-10-CM | POA: Diagnosis not present

## 2019-12-03 DIAGNOSIS — E1142 Type 2 diabetes mellitus with diabetic polyneuropathy: Secondary | ICD-10-CM | POA: Diagnosis not present

## 2019-12-03 DIAGNOSIS — M797 Fibromyalgia: Secondary | ICD-10-CM | POA: Diagnosis not present

## 2019-12-03 DIAGNOSIS — I509 Heart failure, unspecified: Secondary | ICD-10-CM | POA: Diagnosis not present

## 2019-12-03 DIAGNOSIS — I480 Paroxysmal atrial fibrillation: Secondary | ICD-10-CM | POA: Diagnosis not present

## 2019-12-04 DIAGNOSIS — M25512 Pain in left shoulder: Secondary | ICD-10-CM | POA: Diagnosis not present

## 2019-12-04 DIAGNOSIS — M542 Cervicalgia: Secondary | ICD-10-CM | POA: Diagnosis not present

## 2019-12-07 DIAGNOSIS — R569 Unspecified convulsions: Secondary | ICD-10-CM | POA: Diagnosis not present

## 2019-12-07 DIAGNOSIS — I480 Paroxysmal atrial fibrillation: Secondary | ICD-10-CM | POA: Diagnosis not present

## 2019-12-07 DIAGNOSIS — M25512 Pain in left shoulder: Secondary | ICD-10-CM | POA: Diagnosis not present

## 2019-12-10 DIAGNOSIS — M62838 Other muscle spasm: Secondary | ICD-10-CM | POA: Diagnosis not present

## 2019-12-10 DIAGNOSIS — M7542 Impingement syndrome of left shoulder: Secondary | ICD-10-CM | POA: Diagnosis not present

## 2019-12-14 DIAGNOSIS — M797 Fibromyalgia: Secondary | ICD-10-CM | POA: Diagnosis not present

## 2019-12-14 DIAGNOSIS — E1142 Type 2 diabetes mellitus with diabetic polyneuropathy: Secondary | ICD-10-CM | POA: Diagnosis not present

## 2019-12-14 DIAGNOSIS — I110 Hypertensive heart disease with heart failure: Secondary | ICD-10-CM | POA: Diagnosis not present

## 2019-12-14 DIAGNOSIS — I2510 Atherosclerotic heart disease of native coronary artery without angina pectoris: Secondary | ICD-10-CM | POA: Diagnosis not present

## 2019-12-14 DIAGNOSIS — I480 Paroxysmal atrial fibrillation: Secondary | ICD-10-CM | POA: Diagnosis not present

## 2019-12-14 DIAGNOSIS — I509 Heart failure, unspecified: Secondary | ICD-10-CM | POA: Diagnosis not present

## 2019-12-16 DIAGNOSIS — M797 Fibromyalgia: Secondary | ICD-10-CM | POA: Diagnosis not present

## 2019-12-16 DIAGNOSIS — I2510 Atherosclerotic heart disease of native coronary artery without angina pectoris: Secondary | ICD-10-CM | POA: Diagnosis not present

## 2019-12-16 DIAGNOSIS — I110 Hypertensive heart disease with heart failure: Secondary | ICD-10-CM | POA: Diagnosis not present

## 2019-12-16 DIAGNOSIS — E1142 Type 2 diabetes mellitus with diabetic polyneuropathy: Secondary | ICD-10-CM | POA: Diagnosis not present

## 2019-12-16 DIAGNOSIS — I509 Heart failure, unspecified: Secondary | ICD-10-CM | POA: Diagnosis not present

## 2019-12-16 DIAGNOSIS — I480 Paroxysmal atrial fibrillation: Secondary | ICD-10-CM | POA: Diagnosis not present

## 2019-12-18 DIAGNOSIS — I509 Heart failure, unspecified: Secondary | ICD-10-CM | POA: Diagnosis not present

## 2019-12-18 DIAGNOSIS — I110 Hypertensive heart disease with heart failure: Secondary | ICD-10-CM | POA: Diagnosis not present

## 2019-12-18 DIAGNOSIS — E1142 Type 2 diabetes mellitus with diabetic polyneuropathy: Secondary | ICD-10-CM | POA: Diagnosis not present

## 2019-12-18 DIAGNOSIS — M797 Fibromyalgia: Secondary | ICD-10-CM | POA: Diagnosis not present

## 2019-12-18 DIAGNOSIS — I480 Paroxysmal atrial fibrillation: Secondary | ICD-10-CM | POA: Diagnosis not present

## 2019-12-18 DIAGNOSIS — I2510 Atherosclerotic heart disease of native coronary artery without angina pectoris: Secondary | ICD-10-CM | POA: Diagnosis not present

## 2019-12-22 DIAGNOSIS — H9190 Unspecified hearing loss, unspecified ear: Secondary | ICD-10-CM | POA: Diagnosis not present

## 2019-12-22 DIAGNOSIS — Z87891 Personal history of nicotine dependence: Secondary | ICD-10-CM | POA: Diagnosis not present

## 2019-12-22 DIAGNOSIS — Z9181 History of falling: Secondary | ICD-10-CM | POA: Diagnosis not present

## 2019-12-22 DIAGNOSIS — M810 Age-related osteoporosis without current pathological fracture: Secondary | ICD-10-CM | POA: Diagnosis not present

## 2019-12-22 DIAGNOSIS — E1142 Type 2 diabetes mellitus with diabetic polyneuropathy: Secondary | ICD-10-CM | POA: Diagnosis not present

## 2019-12-22 DIAGNOSIS — I480 Paroxysmal atrial fibrillation: Secondary | ICD-10-CM | POA: Diagnosis not present

## 2019-12-22 DIAGNOSIS — I4891 Unspecified atrial fibrillation: Secondary | ICD-10-CM | POA: Diagnosis not present

## 2019-12-22 DIAGNOSIS — I110 Hypertensive heart disease with heart failure: Secondary | ICD-10-CM | POA: Diagnosis not present

## 2019-12-22 DIAGNOSIS — E785 Hyperlipidemia, unspecified: Secondary | ICD-10-CM | POA: Diagnosis not present

## 2019-12-22 DIAGNOSIS — I69391 Dysphagia following cerebral infarction: Secondary | ICD-10-CM | POA: Diagnosis not present

## 2019-12-22 DIAGNOSIS — M797 Fibromyalgia: Secondary | ICD-10-CM | POA: Diagnosis not present

## 2019-12-22 DIAGNOSIS — H3530 Unspecified macular degeneration: Secondary | ICD-10-CM | POA: Diagnosis not present

## 2019-12-22 DIAGNOSIS — H548 Legal blindness, as defined in USA: Secondary | ICD-10-CM | POA: Diagnosis not present

## 2019-12-22 DIAGNOSIS — R634 Abnormal weight loss: Secondary | ICD-10-CM | POA: Diagnosis not present

## 2019-12-22 DIAGNOSIS — R1310 Dysphagia, unspecified: Secondary | ICD-10-CM | POA: Diagnosis not present

## 2019-12-22 DIAGNOSIS — R7989 Other specified abnormal findings of blood chemistry: Secondary | ICD-10-CM | POA: Diagnosis not present

## 2019-12-22 DIAGNOSIS — R195 Other fecal abnormalities: Secondary | ICD-10-CM | POA: Diagnosis not present

## 2019-12-22 DIAGNOSIS — D649 Anemia, unspecified: Secondary | ICD-10-CM | POA: Diagnosis not present

## 2019-12-22 DIAGNOSIS — Z7984 Long term (current) use of oral hypoglycemic drugs: Secondary | ICD-10-CM | POA: Diagnosis not present

## 2019-12-22 DIAGNOSIS — I509 Heart failure, unspecified: Secondary | ICD-10-CM | POA: Diagnosis not present

## 2019-12-22 DIAGNOSIS — F329 Major depressive disorder, single episode, unspecified: Secondary | ICD-10-CM | POA: Diagnosis not present

## 2019-12-22 DIAGNOSIS — Z7982 Long term (current) use of aspirin: Secondary | ICD-10-CM | POA: Diagnosis not present

## 2019-12-22 DIAGNOSIS — Z1231 Encounter for screening mammogram for malignant neoplasm of breast: Secondary | ICD-10-CM | POA: Diagnosis not present

## 2019-12-22 DIAGNOSIS — K589 Irritable bowel syndrome without diarrhea: Secondary | ICD-10-CM | POA: Diagnosis not present

## 2019-12-22 DIAGNOSIS — I2510 Atherosclerotic heart disease of native coronary artery without angina pectoris: Secondary | ICD-10-CM | POA: Diagnosis not present

## 2019-12-22 DIAGNOSIS — M1991 Primary osteoarthritis, unspecified site: Secondary | ICD-10-CM | POA: Diagnosis not present

## 2019-12-22 DIAGNOSIS — N1831 Chronic kidney disease, stage 3a: Secondary | ICD-10-CM | POA: Diagnosis not present

## 2019-12-25 DIAGNOSIS — I110 Hypertensive heart disease with heart failure: Secondary | ICD-10-CM | POA: Diagnosis not present

## 2019-12-25 DIAGNOSIS — I2510 Atherosclerotic heart disease of native coronary artery without angina pectoris: Secondary | ICD-10-CM | POA: Diagnosis not present

## 2019-12-25 DIAGNOSIS — M797 Fibromyalgia: Secondary | ICD-10-CM | POA: Diagnosis not present

## 2019-12-25 DIAGNOSIS — E1142 Type 2 diabetes mellitus with diabetic polyneuropathy: Secondary | ICD-10-CM | POA: Diagnosis not present

## 2019-12-25 DIAGNOSIS — I509 Heart failure, unspecified: Secondary | ICD-10-CM | POA: Diagnosis not present

## 2019-12-25 DIAGNOSIS — I480 Paroxysmal atrial fibrillation: Secondary | ICD-10-CM | POA: Diagnosis not present

## 2019-12-29 DIAGNOSIS — M5412 Radiculopathy, cervical region: Secondary | ICD-10-CM | POA: Diagnosis not present

## 2020-01-01 DIAGNOSIS — I2510 Atherosclerotic heart disease of native coronary artery without angina pectoris: Secondary | ICD-10-CM | POA: Diagnosis not present

## 2020-01-01 DIAGNOSIS — I509 Heart failure, unspecified: Secondary | ICD-10-CM | POA: Diagnosis not present

## 2020-01-01 DIAGNOSIS — M797 Fibromyalgia: Secondary | ICD-10-CM | POA: Diagnosis not present

## 2020-01-01 DIAGNOSIS — I110 Hypertensive heart disease with heart failure: Secondary | ICD-10-CM | POA: Diagnosis not present

## 2020-01-01 DIAGNOSIS — I480 Paroxysmal atrial fibrillation: Secondary | ICD-10-CM | POA: Diagnosis not present

## 2020-01-01 DIAGNOSIS — E1142 Type 2 diabetes mellitus with diabetic polyneuropathy: Secondary | ICD-10-CM | POA: Diagnosis not present

## 2020-01-08 DIAGNOSIS — I2510 Atherosclerotic heart disease of native coronary artery without angina pectoris: Secondary | ICD-10-CM | POA: Diagnosis not present

## 2020-01-08 DIAGNOSIS — I110 Hypertensive heart disease with heart failure: Secondary | ICD-10-CM | POA: Diagnosis not present

## 2020-01-08 DIAGNOSIS — M797 Fibromyalgia: Secondary | ICD-10-CM | POA: Diagnosis not present

## 2020-01-08 DIAGNOSIS — I480 Paroxysmal atrial fibrillation: Secondary | ICD-10-CM | POA: Diagnosis not present

## 2020-01-08 DIAGNOSIS — I509 Heart failure, unspecified: Secondary | ICD-10-CM | POA: Diagnosis not present

## 2020-01-08 DIAGNOSIS — E1142 Type 2 diabetes mellitus with diabetic polyneuropathy: Secondary | ICD-10-CM | POA: Diagnosis not present

## 2020-01-15 DIAGNOSIS — I509 Heart failure, unspecified: Secondary | ICD-10-CM | POA: Diagnosis not present

## 2020-01-15 DIAGNOSIS — I480 Paroxysmal atrial fibrillation: Secondary | ICD-10-CM | POA: Diagnosis not present

## 2020-01-15 DIAGNOSIS — M797 Fibromyalgia: Secondary | ICD-10-CM | POA: Diagnosis not present

## 2020-01-15 DIAGNOSIS — E1142 Type 2 diabetes mellitus with diabetic polyneuropathy: Secondary | ICD-10-CM | POA: Diagnosis not present

## 2020-01-15 DIAGNOSIS — I110 Hypertensive heart disease with heart failure: Secondary | ICD-10-CM | POA: Diagnosis not present

## 2020-01-15 DIAGNOSIS — I2510 Atherosclerotic heart disease of native coronary artery without angina pectoris: Secondary | ICD-10-CM | POA: Diagnosis not present

## 2020-01-19 DIAGNOSIS — M5412 Radiculopathy, cervical region: Secondary | ICD-10-CM | POA: Diagnosis not present

## 2020-01-19 DIAGNOSIS — M4802 Spinal stenosis, cervical region: Secondary | ICD-10-CM | POA: Diagnosis not present

## 2020-01-19 DIAGNOSIS — M79602 Pain in left arm: Secondary | ICD-10-CM | POA: Diagnosis not present

## 2020-01-20 ENCOUNTER — Encounter: Payer: Medicare Other | Admitting: Family

## 2020-01-21 DIAGNOSIS — Z7982 Long term (current) use of aspirin: Secondary | ICD-10-CM | POA: Diagnosis not present

## 2020-01-21 DIAGNOSIS — H9190 Unspecified hearing loss, unspecified ear: Secondary | ICD-10-CM | POA: Diagnosis not present

## 2020-01-21 DIAGNOSIS — H3530 Unspecified macular degeneration: Secondary | ICD-10-CM | POA: Diagnosis not present

## 2020-01-21 DIAGNOSIS — E1142 Type 2 diabetes mellitus with diabetic polyneuropathy: Secondary | ICD-10-CM | POA: Diagnosis not present

## 2020-01-21 DIAGNOSIS — I2510 Atherosclerotic heart disease of native coronary artery without angina pectoris: Secondary | ICD-10-CM | POA: Diagnosis not present

## 2020-01-21 DIAGNOSIS — Z7984 Long term (current) use of oral hypoglycemic drugs: Secondary | ICD-10-CM | POA: Diagnosis not present

## 2020-01-21 DIAGNOSIS — H548 Legal blindness, as defined in USA: Secondary | ICD-10-CM | POA: Diagnosis not present

## 2020-01-21 DIAGNOSIS — I69391 Dysphagia following cerebral infarction: Secondary | ICD-10-CM | POA: Diagnosis not present

## 2020-01-21 DIAGNOSIS — I480 Paroxysmal atrial fibrillation: Secondary | ICD-10-CM | POA: Diagnosis not present

## 2020-01-21 DIAGNOSIS — I509 Heart failure, unspecified: Secondary | ICD-10-CM | POA: Diagnosis not present

## 2020-01-21 DIAGNOSIS — M1991 Primary osteoarthritis, unspecified site: Secondary | ICD-10-CM | POA: Diagnosis not present

## 2020-01-21 DIAGNOSIS — I110 Hypertensive heart disease with heart failure: Secondary | ICD-10-CM | POA: Diagnosis not present

## 2020-01-21 DIAGNOSIS — E785 Hyperlipidemia, unspecified: Secondary | ICD-10-CM | POA: Diagnosis not present

## 2020-01-21 DIAGNOSIS — M797 Fibromyalgia: Secondary | ICD-10-CM | POA: Diagnosis not present

## 2020-01-21 DIAGNOSIS — K589 Irritable bowel syndrome without diarrhea: Secondary | ICD-10-CM | POA: Diagnosis not present

## 2020-01-21 DIAGNOSIS — M810 Age-related osteoporosis without current pathological fracture: Secondary | ICD-10-CM | POA: Diagnosis not present

## 2020-01-21 DIAGNOSIS — F32A Depression, unspecified: Secondary | ICD-10-CM | POA: Diagnosis not present

## 2020-01-21 DIAGNOSIS — R1310 Dysphagia, unspecified: Secondary | ICD-10-CM | POA: Diagnosis not present

## 2020-01-21 DIAGNOSIS — Z9181 History of falling: Secondary | ICD-10-CM | POA: Diagnosis not present

## 2020-01-21 DIAGNOSIS — Z87891 Personal history of nicotine dependence: Secondary | ICD-10-CM | POA: Diagnosis not present

## 2020-01-26 ENCOUNTER — Encounter: Payer: Medicare Other | Admitting: Family

## 2020-01-29 DIAGNOSIS — I2510 Atherosclerotic heart disease of native coronary artery without angina pectoris: Secondary | ICD-10-CM | POA: Diagnosis not present

## 2020-01-29 DIAGNOSIS — I509 Heart failure, unspecified: Secondary | ICD-10-CM | POA: Diagnosis not present

## 2020-01-29 DIAGNOSIS — M797 Fibromyalgia: Secondary | ICD-10-CM | POA: Diagnosis not present

## 2020-01-29 DIAGNOSIS — E1142 Type 2 diabetes mellitus with diabetic polyneuropathy: Secondary | ICD-10-CM | POA: Diagnosis not present

## 2020-01-29 DIAGNOSIS — I480 Paroxysmal atrial fibrillation: Secondary | ICD-10-CM | POA: Diagnosis not present

## 2020-01-29 DIAGNOSIS — I110 Hypertensive heart disease with heart failure: Secondary | ICD-10-CM | POA: Diagnosis not present

## 2020-02-03 DIAGNOSIS — Z23 Encounter for immunization: Secondary | ICD-10-CM | POA: Diagnosis not present

## 2020-02-05 DIAGNOSIS — I480 Paroxysmal atrial fibrillation: Secondary | ICD-10-CM | POA: Diagnosis not present

## 2020-02-05 DIAGNOSIS — M797 Fibromyalgia: Secondary | ICD-10-CM | POA: Diagnosis not present

## 2020-02-05 DIAGNOSIS — E1142 Type 2 diabetes mellitus with diabetic polyneuropathy: Secondary | ICD-10-CM | POA: Diagnosis not present

## 2020-02-05 DIAGNOSIS — I110 Hypertensive heart disease with heart failure: Secondary | ICD-10-CM | POA: Diagnosis not present

## 2020-02-05 DIAGNOSIS — I2510 Atherosclerotic heart disease of native coronary artery without angina pectoris: Secondary | ICD-10-CM | POA: Diagnosis not present

## 2020-02-05 DIAGNOSIS — I509 Heart failure, unspecified: Secondary | ICD-10-CM | POA: Diagnosis not present

## 2020-02-12 DIAGNOSIS — I110 Hypertensive heart disease with heart failure: Secondary | ICD-10-CM | POA: Diagnosis not present

## 2020-02-12 DIAGNOSIS — I480 Paroxysmal atrial fibrillation: Secondary | ICD-10-CM | POA: Diagnosis not present

## 2020-02-12 DIAGNOSIS — M797 Fibromyalgia: Secondary | ICD-10-CM | POA: Diagnosis not present

## 2020-02-12 DIAGNOSIS — I509 Heart failure, unspecified: Secondary | ICD-10-CM | POA: Diagnosis not present

## 2020-02-12 DIAGNOSIS — E1142 Type 2 diabetes mellitus with diabetic polyneuropathy: Secondary | ICD-10-CM | POA: Diagnosis not present

## 2020-02-12 DIAGNOSIS — I2510 Atherosclerotic heart disease of native coronary artery without angina pectoris: Secondary | ICD-10-CM | POA: Diagnosis not present

## 2020-02-24 ENCOUNTER — Other Ambulatory Visit: Payer: Self-pay | Admitting: Internal Medicine

## 2020-02-24 DIAGNOSIS — I110 Hypertensive heart disease with heart failure: Secondary | ICD-10-CM | POA: Diagnosis not present

## 2020-02-24 DIAGNOSIS — H35353 Cystoid macular degeneration, bilateral: Secondary | ICD-10-CM | POA: Diagnosis not present

## 2020-02-24 DIAGNOSIS — H9190 Unspecified hearing loss, unspecified ear: Secondary | ICD-10-CM | POA: Diagnosis not present

## 2020-02-24 DIAGNOSIS — I2510 Atherosclerotic heart disease of native coronary artery without angina pectoris: Secondary | ICD-10-CM | POA: Diagnosis not present

## 2020-02-24 DIAGNOSIS — E1142 Type 2 diabetes mellitus with diabetic polyneuropathy: Secondary | ICD-10-CM | POA: Diagnosis not present

## 2020-02-24 DIAGNOSIS — Z7984 Long term (current) use of oral hypoglycemic drugs: Secondary | ICD-10-CM | POA: Diagnosis not present

## 2020-02-24 DIAGNOSIS — H547 Unspecified visual loss: Secondary | ICD-10-CM

## 2020-02-24 DIAGNOSIS — Z951 Presence of aortocoronary bypass graft: Secondary | ICD-10-CM | POA: Diagnosis not present

## 2020-02-24 DIAGNOSIS — M1991 Primary osteoarthritis, unspecified site: Secondary | ICD-10-CM | POA: Diagnosis not present

## 2020-02-24 DIAGNOSIS — L409 Psoriasis, unspecified: Secondary | ICD-10-CM | POA: Diagnosis not present

## 2020-02-24 DIAGNOSIS — M722 Plantar fascial fibromatosis: Secondary | ICD-10-CM | POA: Diagnosis not present

## 2020-02-24 DIAGNOSIS — I480 Paroxysmal atrial fibrillation: Secondary | ICD-10-CM | POA: Diagnosis not present

## 2020-02-24 DIAGNOSIS — K220 Achalasia of cardia: Secondary | ICD-10-CM | POA: Diagnosis not present

## 2020-02-24 DIAGNOSIS — G4700 Insomnia, unspecified: Secondary | ICD-10-CM | POA: Diagnosis not present

## 2020-02-24 DIAGNOSIS — E1140 Type 2 diabetes mellitus with diabetic neuropathy, unspecified: Secondary | ICD-10-CM

## 2020-02-24 DIAGNOSIS — Z7901 Long term (current) use of anticoagulants: Secondary | ICD-10-CM | POA: Diagnosis not present

## 2020-02-24 DIAGNOSIS — I509 Heart failure, unspecified: Secondary | ICD-10-CM | POA: Diagnosis not present

## 2020-02-24 DIAGNOSIS — Z7982 Long term (current) use of aspirin: Secondary | ICD-10-CM | POA: Diagnosis not present

## 2020-02-24 DIAGNOSIS — I69391 Dysphagia following cerebral infarction: Secondary | ICD-10-CM | POA: Diagnosis not present

## 2020-02-24 DIAGNOSIS — E119 Type 2 diabetes mellitus without complications: Secondary | ICD-10-CM | POA: Diagnosis not present

## 2020-02-24 DIAGNOSIS — M810 Age-related osteoporosis without current pathological fracture: Secondary | ICD-10-CM | POA: Diagnosis not present

## 2020-02-24 DIAGNOSIS — K589 Irritable bowel syndrome without diarrhea: Secondary | ICD-10-CM | POA: Diagnosis not present

## 2020-02-24 DIAGNOSIS — E7800 Pure hypercholesterolemia, unspecified: Secondary | ICD-10-CM | POA: Diagnosis not present

## 2020-02-24 DIAGNOSIS — H3530 Unspecified macular degeneration: Secondary | ICD-10-CM

## 2020-02-24 DIAGNOSIS — M797 Fibromyalgia: Secondary | ICD-10-CM | POA: Diagnosis not present

## 2020-02-24 DIAGNOSIS — H548 Legal blindness, as defined in USA: Secondary | ICD-10-CM | POA: Diagnosis not present

## 2020-02-24 DIAGNOSIS — F32A Depression, unspecified: Secondary | ICD-10-CM | POA: Diagnosis not present

## 2020-02-24 DIAGNOSIS — M8580 Other specified disorders of bone density and structure, unspecified site: Secondary | ICD-10-CM | POA: Diagnosis not present

## 2020-02-24 DIAGNOSIS — Z6829 Body mass index (BMI) 29.0-29.9, adult: Secondary | ICD-10-CM | POA: Diagnosis not present

## 2020-02-26 DIAGNOSIS — I480 Paroxysmal atrial fibrillation: Secondary | ICD-10-CM | POA: Diagnosis not present

## 2020-02-26 DIAGNOSIS — I110 Hypertensive heart disease with heart failure: Secondary | ICD-10-CM | POA: Diagnosis not present

## 2020-02-26 DIAGNOSIS — E119 Type 2 diabetes mellitus without complications: Secondary | ICD-10-CM | POA: Diagnosis not present

## 2020-02-26 DIAGNOSIS — I509 Heart failure, unspecified: Secondary | ICD-10-CM | POA: Diagnosis not present

## 2020-02-26 DIAGNOSIS — I2510 Atherosclerotic heart disease of native coronary artery without angina pectoris: Secondary | ICD-10-CM | POA: Diagnosis not present

## 2020-02-26 DIAGNOSIS — E7800 Pure hypercholesterolemia, unspecified: Secondary | ICD-10-CM | POA: Diagnosis not present

## 2020-02-29 DIAGNOSIS — E7800 Pure hypercholesterolemia, unspecified: Secondary | ICD-10-CM | POA: Diagnosis not present

## 2020-02-29 DIAGNOSIS — I509 Heart failure, unspecified: Secondary | ICD-10-CM | POA: Diagnosis not present

## 2020-02-29 DIAGNOSIS — E119 Type 2 diabetes mellitus without complications: Secondary | ICD-10-CM | POA: Diagnosis not present

## 2020-02-29 DIAGNOSIS — I2510 Atherosclerotic heart disease of native coronary artery without angina pectoris: Secondary | ICD-10-CM | POA: Diagnosis not present

## 2020-02-29 DIAGNOSIS — I480 Paroxysmal atrial fibrillation: Secondary | ICD-10-CM | POA: Diagnosis not present

## 2020-02-29 DIAGNOSIS — I110 Hypertensive heart disease with heart failure: Secondary | ICD-10-CM | POA: Diagnosis not present

## 2020-03-01 DIAGNOSIS — I480 Paroxysmal atrial fibrillation: Secondary | ICD-10-CM | POA: Diagnosis not present

## 2020-03-01 DIAGNOSIS — Z79899 Other long term (current) drug therapy: Secondary | ICD-10-CM | POA: Diagnosis not present

## 2020-03-01 DIAGNOSIS — E119 Type 2 diabetes mellitus without complications: Secondary | ICD-10-CM | POA: Diagnosis not present

## 2020-03-03 DIAGNOSIS — I509 Heart failure, unspecified: Secondary | ICD-10-CM | POA: Diagnosis not present

## 2020-03-03 DIAGNOSIS — E119 Type 2 diabetes mellitus without complications: Secondary | ICD-10-CM | POA: Diagnosis not present

## 2020-03-03 DIAGNOSIS — I2510 Atherosclerotic heart disease of native coronary artery without angina pectoris: Secondary | ICD-10-CM | POA: Diagnosis not present

## 2020-03-03 DIAGNOSIS — E7800 Pure hypercholesterolemia, unspecified: Secondary | ICD-10-CM | POA: Diagnosis not present

## 2020-03-03 DIAGNOSIS — I480 Paroxysmal atrial fibrillation: Secondary | ICD-10-CM | POA: Diagnosis not present

## 2020-03-03 DIAGNOSIS — I110 Hypertensive heart disease with heart failure: Secondary | ICD-10-CM | POA: Diagnosis not present

## 2020-03-04 DIAGNOSIS — E119 Type 2 diabetes mellitus without complications: Secondary | ICD-10-CM | POA: Diagnosis not present

## 2020-03-04 DIAGNOSIS — I110 Hypertensive heart disease with heart failure: Secondary | ICD-10-CM | POA: Diagnosis not present

## 2020-03-04 DIAGNOSIS — I509 Heart failure, unspecified: Secondary | ICD-10-CM | POA: Diagnosis not present

## 2020-03-04 DIAGNOSIS — E7800 Pure hypercholesterolemia, unspecified: Secondary | ICD-10-CM | POA: Diagnosis not present

## 2020-03-04 DIAGNOSIS — I480 Paroxysmal atrial fibrillation: Secondary | ICD-10-CM | POA: Diagnosis not present

## 2020-03-04 DIAGNOSIS — I2510 Atherosclerotic heart disease of native coronary artery without angina pectoris: Secondary | ICD-10-CM | POA: Diagnosis not present

## 2020-03-08 DIAGNOSIS — I2510 Atherosclerotic heart disease of native coronary artery without angina pectoris: Secondary | ICD-10-CM | POA: Diagnosis not present

## 2020-03-08 DIAGNOSIS — E119 Type 2 diabetes mellitus without complications: Secondary | ICD-10-CM | POA: Diagnosis not present

## 2020-03-08 DIAGNOSIS — I480 Paroxysmal atrial fibrillation: Secondary | ICD-10-CM | POA: Diagnosis not present

## 2020-03-08 DIAGNOSIS — E7800 Pure hypercholesterolemia, unspecified: Secondary | ICD-10-CM | POA: Diagnosis not present

## 2020-03-08 DIAGNOSIS — I509 Heart failure, unspecified: Secondary | ICD-10-CM | POA: Diagnosis not present

## 2020-03-08 DIAGNOSIS — I110 Hypertensive heart disease with heart failure: Secondary | ICD-10-CM | POA: Diagnosis not present

## 2020-03-09 DIAGNOSIS — E7800 Pure hypercholesterolemia, unspecified: Secondary | ICD-10-CM | POA: Diagnosis not present

## 2020-03-09 DIAGNOSIS — I480 Paroxysmal atrial fibrillation: Secondary | ICD-10-CM | POA: Diagnosis not present

## 2020-03-09 DIAGNOSIS — E119 Type 2 diabetes mellitus without complications: Secondary | ICD-10-CM | POA: Diagnosis not present

## 2020-03-09 DIAGNOSIS — I110 Hypertensive heart disease with heart failure: Secondary | ICD-10-CM | POA: Diagnosis not present

## 2020-03-09 DIAGNOSIS — I2510 Atherosclerotic heart disease of native coronary artery without angina pectoris: Secondary | ICD-10-CM | POA: Diagnosis not present

## 2020-03-09 DIAGNOSIS — I509 Heart failure, unspecified: Secondary | ICD-10-CM | POA: Diagnosis not present

## 2020-03-11 DIAGNOSIS — E7800 Pure hypercholesterolemia, unspecified: Secondary | ICD-10-CM | POA: Diagnosis not present

## 2020-03-11 DIAGNOSIS — E119 Type 2 diabetes mellitus without complications: Secondary | ICD-10-CM | POA: Diagnosis not present

## 2020-03-11 DIAGNOSIS — I480 Paroxysmal atrial fibrillation: Secondary | ICD-10-CM | POA: Diagnosis not present

## 2020-03-11 DIAGNOSIS — I110 Hypertensive heart disease with heart failure: Secondary | ICD-10-CM | POA: Diagnosis not present

## 2020-03-11 DIAGNOSIS — I2510 Atherosclerotic heart disease of native coronary artery without angina pectoris: Secondary | ICD-10-CM | POA: Diagnosis not present

## 2020-03-11 DIAGNOSIS — I509 Heart failure, unspecified: Secondary | ICD-10-CM | POA: Diagnosis not present

## 2020-03-15 DIAGNOSIS — D509 Iron deficiency anemia, unspecified: Secondary | ICD-10-CM | POA: Diagnosis not present

## 2020-03-15 DIAGNOSIS — R011 Cardiac murmur, unspecified: Secondary | ICD-10-CM | POA: Diagnosis not present

## 2020-03-15 DIAGNOSIS — I110 Hypertensive heart disease with heart failure: Secondary | ICD-10-CM | POA: Diagnosis not present

## 2020-03-15 DIAGNOSIS — E7800 Pure hypercholesterolemia, unspecified: Secondary | ICD-10-CM | POA: Diagnosis not present

## 2020-03-15 DIAGNOSIS — I5042 Chronic combined systolic (congestive) and diastolic (congestive) heart failure: Secondary | ICD-10-CM | POA: Diagnosis not present

## 2020-03-15 DIAGNOSIS — E119 Type 2 diabetes mellitus without complications: Secondary | ICD-10-CM | POA: Diagnosis not present

## 2020-03-15 DIAGNOSIS — I6523 Occlusion and stenosis of bilateral carotid arteries: Secondary | ICD-10-CM | POA: Diagnosis not present

## 2020-03-15 DIAGNOSIS — Z952 Presence of prosthetic heart valve: Secondary | ICD-10-CM | POA: Diagnosis not present

## 2020-03-15 DIAGNOSIS — E782 Mixed hyperlipidemia: Secondary | ICD-10-CM | POA: Diagnosis not present

## 2020-03-15 DIAGNOSIS — K219 Gastro-esophageal reflux disease without esophagitis: Secondary | ICD-10-CM | POA: Diagnosis not present

## 2020-03-15 DIAGNOSIS — I509 Heart failure, unspecified: Secondary | ICD-10-CM | POA: Diagnosis not present

## 2020-03-15 DIAGNOSIS — I480 Paroxysmal atrial fibrillation: Secondary | ICD-10-CM | POA: Diagnosis not present

## 2020-03-15 DIAGNOSIS — I2510 Atherosclerotic heart disease of native coronary artery without angina pectoris: Secondary | ICD-10-CM | POA: Diagnosis not present

## 2020-03-15 DIAGNOSIS — R0989 Other specified symptoms and signs involving the circulatory and respiratory systems: Secondary | ICD-10-CM | POA: Diagnosis not present

## 2020-03-15 DIAGNOSIS — G459 Transient cerebral ischemic attack, unspecified: Secondary | ICD-10-CM | POA: Diagnosis not present

## 2020-03-15 DIAGNOSIS — I10 Essential (primary) hypertension: Secondary | ICD-10-CM | POA: Diagnosis not present

## 2020-03-15 DIAGNOSIS — I4811 Longstanding persistent atrial fibrillation: Secondary | ICD-10-CM | POA: Diagnosis not present

## 2020-03-16 DIAGNOSIS — R011 Cardiac murmur, unspecified: Secondary | ICD-10-CM | POA: Diagnosis not present

## 2020-03-16 DIAGNOSIS — I5042 Chronic combined systolic (congestive) and diastolic (congestive) heart failure: Secondary | ICD-10-CM | POA: Diagnosis not present

## 2020-03-16 DIAGNOSIS — I6523 Occlusion and stenosis of bilateral carotid arteries: Secondary | ICD-10-CM | POA: Diagnosis not present

## 2020-03-16 DIAGNOSIS — Z952 Presence of prosthetic heart valve: Secondary | ICD-10-CM | POA: Diagnosis not present

## 2020-03-16 DIAGNOSIS — I2510 Atherosclerotic heart disease of native coronary artery without angina pectoris: Secondary | ICD-10-CM | POA: Diagnosis not present

## 2020-03-16 DIAGNOSIS — I4811 Longstanding persistent atrial fibrillation: Secondary | ICD-10-CM | POA: Diagnosis not present

## 2020-03-16 DIAGNOSIS — E119 Type 2 diabetes mellitus without complications: Secondary | ICD-10-CM | POA: Diagnosis not present

## 2020-03-16 DIAGNOSIS — E782 Mixed hyperlipidemia: Secondary | ICD-10-CM | POA: Diagnosis not present

## 2020-03-16 DIAGNOSIS — R0989 Other specified symptoms and signs involving the circulatory and respiratory systems: Secondary | ICD-10-CM | POA: Diagnosis not present

## 2020-03-16 DIAGNOSIS — I10 Essential (primary) hypertension: Secondary | ICD-10-CM | POA: Diagnosis not present

## 2020-03-16 DIAGNOSIS — K219 Gastro-esophageal reflux disease without esophagitis: Secondary | ICD-10-CM | POA: Diagnosis not present

## 2020-03-16 DIAGNOSIS — I509 Heart failure, unspecified: Secondary | ICD-10-CM | POA: Diagnosis not present

## 2020-03-17 DIAGNOSIS — E119 Type 2 diabetes mellitus without complications: Secondary | ICD-10-CM | POA: Diagnosis not present

## 2020-03-17 DIAGNOSIS — I480 Paroxysmal atrial fibrillation: Secondary | ICD-10-CM | POA: Diagnosis not present

## 2020-03-17 DIAGNOSIS — E7800 Pure hypercholesterolemia, unspecified: Secondary | ICD-10-CM | POA: Diagnosis not present

## 2020-03-17 DIAGNOSIS — I110 Hypertensive heart disease with heart failure: Secondary | ICD-10-CM | POA: Diagnosis not present

## 2020-03-17 DIAGNOSIS — I509 Heart failure, unspecified: Secondary | ICD-10-CM | POA: Diagnosis not present

## 2020-03-17 DIAGNOSIS — I2510 Atherosclerotic heart disease of native coronary artery without angina pectoris: Secondary | ICD-10-CM | POA: Diagnosis not present

## 2020-03-23 ENCOUNTER — Other Ambulatory Visit: Payer: Self-pay | Admitting: Internal Medicine

## 2020-03-23 DIAGNOSIS — E1140 Type 2 diabetes mellitus with diabetic neuropathy, unspecified: Secondary | ICD-10-CM

## 2020-03-23 DIAGNOSIS — H3530 Unspecified macular degeneration: Secondary | ICD-10-CM

## 2020-03-23 DIAGNOSIS — H547 Unspecified visual loss: Secondary | ICD-10-CM

## 2020-04-19 ENCOUNTER — Ambulatory Visit: Payer: Self-pay | Admitting: *Deleted
